# Patient Record
Sex: Female | Born: 1965 | Race: White | Hispanic: No | Marital: Married | Smoking: Current every day smoker
Health system: Southern US, Community
[De-identification: ages and names within clinical notes are randomized; demographics above are authoritative.]

## PROBLEM LIST (undated history)

## (undated) DIAGNOSIS — K219 Gastro-esophageal reflux disease without esophagitis: Secondary | ICD-10-CM

## (undated) DIAGNOSIS — K2270 Barrett's esophagus without dysplasia: Secondary | ICD-10-CM

## (undated) DIAGNOSIS — R112 Nausea with vomiting, unspecified: Secondary | ICD-10-CM

## (undated) DIAGNOSIS — D131 Benign neoplasm of stomach: Secondary | ICD-10-CM

## (undated) DIAGNOSIS — Z9889 Other specified postprocedural states: Secondary | ICD-10-CM

## (undated) DIAGNOSIS — K5900 Constipation, unspecified: Secondary | ICD-10-CM

## (undated) HISTORY — PX: KNEE ARTHROSCOPY W/ MENISCECTOMY: SHX1879

## (undated) HISTORY — DX: Gastro-esophageal reflux disease without esophagitis: K21.9

## (undated) HISTORY — DX: Constipation, unspecified: K59.00

## (undated) HISTORY — DX: Barrett's esophagus without dysplasia: K22.70

## (undated) HISTORY — PX: UPPER GASTROINTESTINAL ENDOSCOPY: SHX188

## (undated) HISTORY — DX: Benign neoplasm of stomach: D13.1

---

## 1999-05-10 ENCOUNTER — Other Ambulatory Visit: Admission: RE | Admit: 1999-05-10 | Discharge: 1999-05-10 | Payer: Self-pay | Admitting: Obstetrics and Gynecology

## 1999-05-22 ENCOUNTER — Inpatient Hospital Stay (HOSPITAL_COMMUNITY): Admission: AD | Admit: 1999-05-22 | Discharge: 1999-05-22 | Payer: Self-pay | Admitting: Obstetrics and Gynecology

## 2001-04-07 ENCOUNTER — Other Ambulatory Visit: Admission: RE | Admit: 2001-04-07 | Discharge: 2001-04-07 | Payer: Self-pay | Admitting: Dermatology

## 2001-12-10 HISTORY — PX: FOOT SURGERY: SHX648

## 2002-07-24 ENCOUNTER — Ambulatory Visit (HOSPITAL_COMMUNITY): Admission: RE | Admit: 2002-07-24 | Discharge: 2002-07-24 | Payer: Self-pay | Admitting: Family Medicine

## 2002-07-24 ENCOUNTER — Encounter: Payer: Self-pay | Admitting: Family Medicine

## 2002-08-11 ENCOUNTER — Encounter: Payer: Self-pay | Admitting: Family Medicine

## 2002-08-11 ENCOUNTER — Ambulatory Visit (HOSPITAL_COMMUNITY): Admission: RE | Admit: 2002-08-11 | Discharge: 2002-08-11 | Payer: Self-pay | Admitting: Family Medicine

## 2003-12-09 ENCOUNTER — Ambulatory Visit (HOSPITAL_COMMUNITY): Admission: RE | Admit: 2003-12-09 | Discharge: 2003-12-09 | Payer: Self-pay | Admitting: Family Medicine

## 2007-04-09 ENCOUNTER — Ambulatory Visit (HOSPITAL_COMMUNITY): Admission: RE | Admit: 2007-04-09 | Discharge: 2007-04-09 | Payer: Self-pay | Admitting: Family Medicine

## 2007-07-21 ENCOUNTER — Ambulatory Visit (HOSPITAL_COMMUNITY): Admission: RE | Admit: 2007-07-21 | Discharge: 2007-07-21 | Payer: Self-pay | Admitting: Obstetrics and Gynecology

## 2007-09-03 ENCOUNTER — Ambulatory Visit (HOSPITAL_COMMUNITY): Admission: RE | Admit: 2007-09-03 | Discharge: 2007-09-03 | Payer: Self-pay | Admitting: Obstetrics & Gynecology

## 2007-09-03 ENCOUNTER — Encounter: Payer: Self-pay | Admitting: Obstetrics & Gynecology

## 2007-12-10 ENCOUNTER — Ambulatory Visit (HOSPITAL_COMMUNITY): Admission: RE | Admit: 2007-12-10 | Discharge: 2007-12-10 | Payer: Self-pay | Admitting: Obstetrics & Gynecology

## 2008-03-10 ENCOUNTER — Ambulatory Visit: Payer: Self-pay | Admitting: Gastroenterology

## 2008-09-09 ENCOUNTER — Other Ambulatory Visit: Admission: RE | Admit: 2008-09-09 | Discharge: 2008-09-09 | Payer: Self-pay | Admitting: Obstetrics and Gynecology

## 2008-09-20 ENCOUNTER — Ambulatory Visit (HOSPITAL_COMMUNITY): Admission: RE | Admit: 2008-09-20 | Discharge: 2008-09-20 | Payer: Self-pay | Admitting: Obstetrics & Gynecology

## 2009-01-21 ENCOUNTER — Encounter: Payer: Self-pay | Admitting: Orthopedic Surgery

## 2009-01-21 ENCOUNTER — Ambulatory Visit (HOSPITAL_COMMUNITY): Admission: RE | Admit: 2009-01-21 | Discharge: 2009-01-21 | Payer: Self-pay | Admitting: Family Medicine

## 2009-02-15 ENCOUNTER — Encounter: Payer: Self-pay | Admitting: Orthopedic Surgery

## 2009-02-16 ENCOUNTER — Ambulatory Visit: Payer: Self-pay | Admitting: Orthopedic Surgery

## 2009-02-16 DIAGNOSIS — M25559 Pain in unspecified hip: Secondary | ICD-10-CM | POA: Insufficient documentation

## 2009-02-17 ENCOUNTER — Encounter: Payer: Self-pay | Admitting: Orthopedic Surgery

## 2009-02-22 ENCOUNTER — Telehealth: Payer: Self-pay | Admitting: Orthopedic Surgery

## 2009-02-23 ENCOUNTER — Ambulatory Visit (HOSPITAL_COMMUNITY): Admission: RE | Admit: 2009-02-23 | Discharge: 2009-02-23 | Payer: Self-pay | Admitting: Orthopedic Surgery

## 2009-03-02 ENCOUNTER — Ambulatory Visit: Payer: Self-pay | Admitting: Orthopedic Surgery

## 2009-03-03 ENCOUNTER — Encounter: Payer: Self-pay | Admitting: Orthopedic Surgery

## 2009-03-04 ENCOUNTER — Encounter: Payer: Self-pay | Admitting: Orthopedic Surgery

## 2009-03-15 ENCOUNTER — Encounter: Payer: Self-pay | Admitting: Orthopedic Surgery

## 2009-05-18 ENCOUNTER — Encounter: Payer: Self-pay | Admitting: Orthopedic Surgery

## 2011-01-11 NOTE — Miscellaneous (Signed)
Summary: notes faxed to [facility],referred  Clinical Lists Changes

## 2011-01-11 NOTE — Assessment & Plan Note (Signed)
Summary: [facility] results from [facility]   History of Present Illness: I saw Carla Wise in the office today for a followup visit.  She is a 45 years old woman with the complaint of:  MRI results of the right hip taken at [facility] 02/23/09.  Vicodin for pain, does not like pills did not take.  IMPRESSION:   1.  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  No acetabular tear or paralabral cyst is identified, and the femoral findings could be incidental. 2.  A smaller cyst in the left femoral neck is probably an incidental synovial herniation pit. 3.  The visualized bony pelvis appears unremarkable. 4.  No pelvic or proximal thigh muscular or tendon abnormality identified.   HISTORY I saw Carla Wise  in the office today for an initial visit.  She is a 45 years old woman with the complaint of:  RIGHT HIP PAIN.  Referral from Dr. Lilyan Punt. for eval and treat.  Xrays at [facility] on 01-21-09. Negative.  Patient states she has pain in her right groin with pain shooting down her inner thigh for 4-5 months. No known injury. She cannot cross her legs. She states that it wakes her up at night. She has pain with external rotation.  Dr. Gerda Diss gave her Etodolac 400 mg 3X a day.  denies fever chills, weight loss, appetite change   Work Status: working   Allergies: No Known Drug Allergies  Physical Exam  Additional Exam:  Constitutional: vital signs see recorded values. General: normal development, nutrition, and grooming. No deformity. Body Habitus is endomorphic CDV: Observation and palpation was normal  Lymph: palpation of the lymph nodes were normal Skin: inspection and palpation of the skin revealed no abnormalities  Neuro: coordination: normal              DTR's normal              Sensation was normal    Psyche: Alert and oriented x 3. Mood was normal.  Affect: normal  MSK: Gait: slightly abnormal favoring the RIGHT lower extremity  The upper extremities were normal on inspection with no contracture, atrophy loss of muscle tone or instability  LEFT lower extremity normal alignment, normal range of motion, normal strength, joints were reduced in proper position  RIGHT lower extremity active flexion was only 45 passive was 80 with pain throughout the arc of motion.  We could not really internally rotate the leg in external rotation causes increased pain, there is no evidence of muscle tone loss or instability    Impression & Recommendations:  Problem # 1:  HIP PAIN (ICD-719.45)  review history for MRI results.  The MRI was done at [facility] and it was reviewed with the report   Assessment:  At this point I think we should get a referral to make sure this is a benign tumor.  This may just be acetabular impingement but I am not sure I would like to get a second opinion.   The following medications were removed from the medication list:    Vicodin 5-500 Mg Tabs (Hydrocodone-acetaminophen) .Marland Kitchen... 1 by mouth q 4 as needed pain  Orders: Est. 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

## 2011-01-11 NOTE — Assessment & Plan Note (Signed)
Summary: R HIP PAIN/XR AT [facility]   History of Present Illness: I saw Carla Wise  in the office today for an initial visit.  She is a 45 years old woman with the complaint of:  RIGHT HIP PAIN.  Referral from Dr. Lilyan Punt. for eval and treat.  Xrays at [facility] on 01-21-09. Negative.  Patient states she has pain in her right groin with pain shooting down her inner thigh for 4-5 months. No known injury. She cannot cross her legs. She states that it wakes her up at night. She has pain with external rotation.  Dr. Gerda Diss gave her Etodolac 400 mg 3X a day.  denies fever chills, weight loss, appetite change   Work Status: working   Press photographer & Management     Alcohol drinks/day: 0     Smoking Status: current     Packs/Day: 0.5     Caffeine use/day: o  Allergies (verified): No Known Drug Allergies  Past Medical History:    na  Past Surgical History:    cervical ablasion  Family History:    FH of Cancer:   Social History:    Patient is married.     shipping and receiving clerk    Alcohol drinks/day:  0    Caffeine use/day:  o    Smoking Status:  current    Packs/Day:  0.5  Review of Systems MS:  Complains of joint pain; denies rheumatoid arthritis, joint swelling, gout, bone cancer, osteoporosis, and .  The review of systems is negative for General, Cardiac , Resp, GI, GU, Neuro, Endo, Psych, Derm, EENT, Immunology, and Lymphatic.  Physical Exam  Additional Exam:  Constitutional: vital signs see recorded values. General: normal development, nutrition, and grooming. No deformity. Body Habitus is endomorphic CDV: Observation and palpation was normal  Lymph: palpation of the lymph nodes were normal Skin: inspection and palpation of the skin revealed no abnormalities  Neuro: coordination: normal              DTR's normal              Sensation was normal  Psyche: Alert and oriented x 3. Mood was normal.  Affect: normal    MSK: Gait: slightly abnormal favoring the RIGHT lower extremity  The upper extremities were normal on inspection with no contracture, atrophy loss of muscle tone or instability  LEFT lower extremity normal alignment, normal range of motion, normal strength, joints were reduced in proper position  RIGHT lower extremity active flexion was only 45 passive was 80 with pain throughout the arc of motion.  We could not really internally rotate the leg in external rotation causes increased pain, there is no evidence of muscle tone loss or instability    Impression & Recommendations:  Problem # 1:  HIP PAIN (ICD-719.45) Assessment New The x-rays were done at [facility]. The report and the films have been reviewed.   incnoclusive Possible FAI vs NOF   recommend: MRI hips  X-rays: inconclusive   Assessment:  This will definitely warrant further workup there is no conclusive diagnosis at this point.  Different diagnoses includes joint effusion, femoral acetabular impingement, bone tumor.  Her updated medication list for this problem includes:    Vicodin 5-500 Mg Tabs (Hydrocodone-acetaminophen) .Marland Kitchen... 1 by mouth q 4 as needed pain  Orders: New Patient Level III ([address])  Medications Added to Medication List This Visit: 1)  Vicodin 5-500 Mg Tabs (Hydrocodone-acetaminophen) .Marland Kitchen.. 1 by mouth q 4  as needed pain Prescriptions: VICODIN 5-500 MG TABS (HYDROCODONE-ACETAMINOPHEN) 1 by mouth q 4 as needed pain  #40 x 1   Entered and Authorized by:   Fuller Canada MD   Signed by:   Fuller Canada MD on 02/16/2009   Method used:   Print then Give to Patient   RxID:   [id number]

## 2011-01-11 NOTE — Letter (Signed)
Summary: Historic Patient File  Historic Patient File   Imported By: Elvera Maria 02/22/2009 10:42:50  _____________________________________________________________________  External Attachment:    Type:   Image     Comment:   history

## 2011-01-11 NOTE — Letter (Signed)
Summary: Office notes from [facility]/Dr. Caswell Corwin  Office notes from [facility]/Dr. Caswell Corwin   Imported By: Jacklynn Ganong 05/19/2009 07:57:43  _____________________________________________________________________  External Attachment:    Type:   Image     Comment:   External Document

## 2011-01-11 NOTE — Progress Notes (Signed)
Summary: MRI appointment.  Phone Note Outgoing Call   Call placed by: Waldon Reining,  February 22, 2009 3:11 PM Call placed to: Patient Action Taken: Phone Call Completed, Appt scheduled Summary of Call: I called to give the patient her MRI appointment at [facility] on 02-23-09 at 5:30 pm. Patient has Renette Butters Rule [facility]), no precert is needed per [place]. Patient will follow up here to go over results.

## 2011-01-11 NOTE — Consult Note (Signed)
Summary: Rpt [facility] Ctr [address],Dr Andrey Campanile  [facility] Ctr [address],Dr Andrey Campanile   Imported By: Cammie Sickle 04/15/2009 09:44:17  _____________________________________________________________________  External Attachment:    Type:   Image     Comment:   External Document

## 2011-01-11 NOTE — Letter (Signed)
Summary: *Orthopedic Consult Note  [facility]  [address]  [place], [place] [address]   Phone: [phone]  Fax: [phone]    Re:    Carla Wise DOB:    [birth year]   Dear: Lorin Picket    Thank you for requesting that we see the above patient for consultation.  A copy of the detailed office note will be sent under separate cover, for your review.  Evaluation today is consistent with:  1)  HIP PAIN (ICD-719.45)   Our recommendation is for: MRI RIGHT hip       Thank you for this opportunity to look after your patient.  Sincerely,   Terrance Mass. MD.

## 2011-01-11 NOTE — Letter (Signed)
Summary: *Referral Letter  [facility]  [address]  [place], [place] [address]   Phone: [phone]  Fax: [phone]    03/03/2009  Thank you in advance for agreeing to see my patient:  Carla Wise [address] [place], [place]  [address]  Phone: [phone]  Reason for Referral: Cyst femoral neck; ? tumor  Procedures Requested:   Current Medical Problems: 1)  HIP PAIN (ICD-719.45) bone tumor          Pertinent Labs: MRI    Thank you again for agreeing to see our patient; please contact us if you have any further questions or need additional information.  Sincerely,  Fuller Canada MD

## 2011-04-24 NOTE — Assessment & Plan Note (Signed)
[name], [name]               CHART#:  [id number]   DATE:  03/10/2008                       DOB:  [birth year]   REFERRING PHYSICIAN:  Patrica Duel, M.D.   REASON FOR CONSULTATION:  Constipation.   HISTORY OF PRESENT ILLNESS:  [name] is a 45 year old-female who  has always had problems moving her bowels.  She goes every three to  four days.  She denies any abdominal pain, bloating or rectal bleeding  associated with the constipation.  Sometimes she strains.  The symptoms  are not any worse than usual.  She did take Amitiza 24 mcg twice a day,  which caused her to have bloating and some gas.  She has also added  fiber cereal, which may be contributing to her bloating and gas.  She  eats Activia twice a day.  Her appetite has been good.  She has not had  any diarrhea.   PAST MEDICAL HISTORY:  Rare headaches (less than one every six months).   PAST SURGICAL HISTORY:  Uterine ablation in September 2008.   ALLERGIES:  No known drug allergies.   MEDICATIONS:  1. Aleve as needed.  2. Amitiza 24 mcg b.i.d.   FAMILY HISTORY:  She has no family history of colon cancer, colon  polyps, breast cancer, uterine cancer or ovarian cancer.   SOCIAL HISTORY:  She is divorced and has one child, age 23.  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  She smokes less than  half pack per day.  She denies any alcohol use.   REVIEW OF SYSTEMS:  She reports that her thyroid was checked in 2008 and  was normal.  Her review of systems is per the HPI.  Otherwise all  systems are negative.   PHYSICAL EXAMINATION:  Weight 102 pounds (down 1 pound since 2000).  Height 5 feet 2 inches.  Temperature 98.2, blood pressure 110/70, pulse  88.  GENERAL: She is in no apparent distress.  Alert and oriented x4.HEENT:  Atraumatic, normocephalic,  Pupils equal and reactive to light.  Mouth  no oral lesions.  Posterior pharynx without erythema or exudate.NECK:  Has full range of motion.  No  lymphadenopathy.LUNGS:  Clear to  auscultation bilaterally.CARDIOVASCULAR:  Regular rhythm.  No  murmur.ABDOMEN:  Bowel sounds are present.  Soft, nontender,  nondistended.  No rebound or guarding.EXTREMITIES:  Have no cyanosis or  edema.  NEURO:  She has no focal or neurologic deficits.   ASSESSMENT:  Carla Wise is a 45 year old female who has had  constipation for many years  Her symptoms are not any different.  She  likely does not consume enough fiber or drink enough water.  Thank you  for allowing me to see Ms. Bullard in consultation.  My recommendations  follow.   RECOMMENDATIONS:  1. She is given her discharge instructions in writing.  For the      management of her constipation she is asked to drink 6 to 8 cups of      water daily.  She should follow a high fiber diet.  She was given a      handout on high fiber diet.  She is asked to find the fiber that      works for her.  She does understand that some fiber may cause  bloating.  2. She is to take Digestive Advantage constipation formulation daily.      Given a sample of 20.  3. She can try Amitiza 8 mcg twice a day, and she is given 8 pills.      She is also given a prescription for the Amitiza at 8 mcg daily.      The Amitiza did allow her to have a bowel movement every day.  She      did not like to take the Senokot, which caused her to have more      growling in her abdomen and so she stopped it.  4. She may follow up with me as needed.  5. Screening colonoscopy at age 53.       Kassie Mends, M.D.  Electronically Signed     SM/MEDQ  D:  03/10/2008  T:  03/10/2008  Job:  [id number]   cc:   Patrica Duel, M.D.

## 2011-04-24 NOTE — Op Note (Signed)
NAMECACHE, DECOURSEY              ACCOUNT NO.:  [id number]   MEDICAL RECORD NO.:  [id number]          PATIENT TYPE:  AMB   LOCATION:  DAY                           FACILITY:  [facility]   PHYSICIAN:  Lazaro Arms, M.D.   DATE OF BIRTH:  1966-01-01   DATE OF PROCEDURE:  09/03/2007  DATE OF DISCHARGE:                               OPERATIVE REPORT   PREOPERATIVE DIAGNOSES:  1. Menometrorrhagia.  2. Dysmenorrhea.   POSTOPERATIVE DIAGNOSES:  1. Menometrorrhagia.  2. Dysmenorrhea.   PROCEDURE:  Hysteroscopy, dilatation and curettage, endometrial  ablation.   SURGEON:  Lazaro Arms, M.D.   ANESTHESIA:  General endotracheal.   FINDINGS:  Patient had a normal endometrial cavity, no polyps, no  fibroids.   DESCRIPTION OF OPERATION:  Patient was taken to the operating room and  placed in a supine position, where she underwent general endotracheal  anesthesia.  Placed in a dorsal supine position, prepped and draped in  the usual sterile fashion.  A Graves speculum was placed.  The cervix  was grasped.  The cervix was dilated serially to allow passage of the  hysteroscopy.  A hysteroscopy was performed and was found to be  completely normal.  No polyps, no fibroids, no distortion of the  endometrium whatsoever.  The ThermaChoice III endometrial ablation  balloon was then used, 13 cc of D5W was required to maintain a pressure  between 192 mmHg throughout the procedure.  It was heated to 88 degrees  Celsius.  Total therapy time was 9 minutes, 14 seconds.  All of the  fluid was returned at the end of the procedure.   The patient tolerated the procedure well.  She experienced minimal blood  loss.  She was taken to the recovery room in good, stable condition.  All counts were correct.      Lazaro Arms, M.D.  Electronically Signed     LHE/MEDQ  D:  09/03/2007  T:  09/03/2007  Job:  [id number]

## 2011-07-19 ENCOUNTER — Ambulatory Visit (INDEPENDENT_AMBULATORY_CARE_PROVIDER_SITE_OTHER): Payer: No Typology Code available for payment source | Admitting: Orthopedic Surgery

## 2011-07-19 ENCOUNTER — Encounter: Payer: Self-pay | Admitting: Orthopedic Surgery

## 2011-07-19 VITALS — Resp 18 | Ht 62.0 in | Wt 101.0 lb

## 2011-07-19 DIAGNOSIS — S83209A Unspecified tear of unspecified meniscus, current injury, unspecified knee, initial encounter: Secondary | ICD-10-CM | POA: Insufficient documentation

## 2011-07-19 DIAGNOSIS — IMO0002 Reserved for concepts with insufficient information to code with codable children: Secondary | ICD-10-CM

## 2011-07-19 NOTE — Progress Notes (Signed)
Chief complaint: pain left knee x 6 months  HPI:(4) sudden onset of medial left knee pain 5-6 mos ago, unrelieved by advil and tylenol. C/O stabbing, 6/10 intermittent, pain and giving out. Its worse with flexing the knee and associated with swelling.  ROS:(2) all negative   PFSH: (1) History reviewed. No pertinent past medical history. Past Surgical History  Procedure Date  . No past surgeries    Physical Exam(12) GENERAL: normal development   CDV: pulses are normal   Skin: normal  Lymph: nodes were not palpable/normal  Psychiatric: awake, alert and oriented  Neuro: normal sensation  MSK no limp Left knee  1 medial joint line tenderness, and small joint effusion. 2 flexion 120 degrees with pain at terminal flexion.  3 strength normal  4 knee is stable  5/6 right knee normal ROM no swelling    Assessment: x-rays: 3 views show mild OA     Plan: MRI/left medial meniscus tear

## 2011-07-19 NOTE — Patient Instructions (Addendum)
MRI knee  Come back in 2 weeks for results

## 2011-07-30 ENCOUNTER — Ambulatory Visit (HOSPITAL_COMMUNITY)
Admission: RE | Admit: 2011-07-30 | Discharge: 2011-07-30 | Disposition: A | Discharge status: Home / Self Care | Payer: No Typology Code available for payment source | Source: Ambulatory Visit | Attending: Orthopedic Surgery | Admitting: Orthopedic Surgery

## 2011-07-30 ENCOUNTER — Telehealth: Payer: Self-pay | Admitting: Radiology

## 2011-07-30 DIAGNOSIS — M23329 Other meniscus derangements, posterior horn of medial meniscus, unspecified knee: Secondary | ICD-10-CM | POA: Insufficient documentation

## 2011-07-30 DIAGNOSIS — M25669 Stiffness of unspecified knee, not elsewhere classified: Secondary | ICD-10-CM | POA: Insufficient documentation

## 2011-07-30 DIAGNOSIS — M25569 Pain in unspecified knee: Secondary | ICD-10-CM | POA: Insufficient documentation

## 2011-07-30 DIAGNOSIS — S83209A Unspecified tear of unspecified meniscus, current injury, unspecified knee, initial encounter: Secondary | ICD-10-CM

## 2011-07-30 NOTE — Telephone Encounter (Signed)
Patient had MRI appointment at [facility] on 07-26-11 at 6:00. Patient has [facility], no precert needed per [employer]. I could not get in touch with the patient so she did not go.

## 2011-08-02 ENCOUNTER — Encounter: Payer: Self-pay | Admitting: Orthopedic Surgery

## 2011-08-02 ENCOUNTER — Ambulatory Visit (INDEPENDENT_AMBULATORY_CARE_PROVIDER_SITE_OTHER): Payer: No Typology Code available for payment source | Admitting: Orthopedic Surgery

## 2011-08-02 DIAGNOSIS — M23329 Other meniscus derangements, posterior horn of medial meniscus, unspecified knee: Secondary | ICD-10-CM

## 2011-08-02 NOTE — Patient Instructions (Signed)
You have been scheduled for surgery.  All surgeries carry some risk.  Remember you always have the option of continued nonsurgical treatment. However in this situation the risks vs. the benefits favor surgery as the best treatment option. The risks of the surgery includes the following but is not limited to bleeding, infection, pulmonary embolus, death from anesthesia, nerve injury vascular injury or need for further surgery, continued pain.  Specific to this procedure the following risks and complications are rare but possible Swelling   You have been scheduled for surgery  Please Go to your preoperative appointment and bring the folder that was given to you today  Please stop all blood thinners ibuprofen Naprosyn aspirin Plavix Coumadin

## 2011-08-02 NOTE — H&P (Signed)
Carla Wise is an 45 y.o. female.   Chief Complaint: LEFT knee pain HPI: 45-year-old female complains of medial joint line pain and mechanical symptoms in the LEFT knee.  She did not respond to nonoperative treatment was sent for MRI and it shows that she does have a medial meniscal tear.  Her symptoms are inhibited her normal activities and she would like to have surgery.    No past medical history on file.  Past Surgical History  Procedure Date  . No past surgeries     Family History  Problem Relation Age of Onset  . Heart disease    . Cancer     Social History:  reports that she has been smoking Cigarettes.  She has been smoking about .5 packs per day. She does not have any smokeless tobacco history on file. Her alcohol and drug histories not on file.  Allergies: No Known Allergies  No current outpatient prescriptions on file as of 08/02/2011.   No current facility-administered medications on file as of 08/02/2011.    No results found for this or any previous visit (from the past 48 hour(s)). @[id number]@  Review of Systems  Constitutional: Negative.   HENT: Negative.   Eyes: Negative.   Respiratory: Negative.   Cardiovascular: Negative.   Gastrointestinal: Negative.   Genitourinary: Negative.   Skin: Negative.   Endo/Heme/Allergies: Negative.   Psychiatric/Behavioral: Negative.     There were no vitals taken for this visit. Physical Exam  Constitutional: She is oriented to person, place, and time. She appears well-developed and well-nourished.  HENT:  Head: Normocephalic and atraumatic.  Neck: Normal range of motion. Neck supple.  Cardiovascular: Normal rate and regular rhythm.   Respiratory: Effort normal.  GI: Soft.  Musculoskeletal:       Right shoulder: Normal.       Left shoulder: Normal.       Right hip: Normal.       Left hip: Normal.       Left knee: She exhibits decreased range of motion, swelling and abnormal meniscus. She exhibits no  deformity, normal alignment, no LCL laxity, normal patellar mobility, no bony tenderness and no MCL laxity. tenderness found. Medial joint line tenderness noted. No lateral joint line, no MCL, no LCL and no patellar tendon tenderness noted.  Neurological: She is alert and oriented to person, place, and time.  Skin: Skin is warm.  Psychiatric: She has a normal mood and affect.     Assessment/Plan MRI shows torn medial meniscus LEFT knee with mild medial compartment chondral thinning  Recommend arthroscopy LEFT knee partial medial meniscectomy  Carla Wise 08/02/2011, 3:54 PM    

## 2011-08-02 NOTE — Progress Notes (Signed)
This is a post imaging followup visit  Diagnosis MEDIAL MEN TEAR  Image MRI LEFT KNEE  Reading POSITIVE FOR mmt, MILD MEDIAL oa   I have viewed the image and the report, I agree with the report   The plan will be SALK PARTIAL MEDIAL MENISECTOMY   PREOP VISIT   SEE H/P

## 2011-08-06 ENCOUNTER — Telehealth: Payer: Self-pay | Admitting: Orthopedic Surgery

## 2011-08-06 NOTE — Telephone Encounter (Addendum)
Called insurer [employer] at [employer] [phone], RE: out-patient surgery scheduled left knee, [facility] . Spoke with Montel Clock A. Verified benefits effective 06/20/11 through 12/21/11, as this is a "short term policy."  States no prior authorization is required. States patient has met $254 of her $1000 deductible.  All claims are subject for review; no guarantee of payment until received and reviewed.

## 2011-08-07 ENCOUNTER — Encounter (HOSPITAL_COMMUNITY)
Admission: RE | Admit: 2011-08-07 | Discharge: 2011-08-07 | Disposition: A | Discharge status: Home / Self Care | Payer: No Typology Code available for payment source | Source: Ambulatory Visit | Attending: Orthopedic Surgery | Admitting: Orthopedic Surgery

## 2011-08-07 ENCOUNTER — Encounter (HOSPITAL_COMMUNITY): Payer: Self-pay

## 2011-08-07 HISTORY — DX: Other specified postprocedural states: Z98.890

## 2011-08-07 HISTORY — DX: Nausea with vomiting, unspecified: R11.2

## 2011-08-07 HISTORY — DX: Nausea with vomiting, unspecified: Z98.890

## 2011-08-07 LAB — CBC
HCT: 39.1 % (ref 36.0–46.0)
Hemoglobin: 13.2 g/dL (ref 12.0–15.0)
MCH: 30 pg (ref 26.0–34.0)
MCHC: 33.8 g/dL (ref 30.0–36.0)
MCV: 88.9 fL (ref 78.0–100.0)
Platelets: 245 10*3/uL (ref 150–400)
RBC: 4.4 MIL/uL (ref 3.87–5.11)
RDW: 14 % (ref 11.5–15.5)
WBC: 6.9 10*3/uL (ref 4.0–10.5)

## 2011-08-07 LAB — BASIC METABOLIC PANEL
BUN: 8 mg/dL (ref 6–23)
CO2: 26 mEq/L (ref 19–32)
Calcium: 9.5 mg/dL (ref 8.4–10.5)
Chloride: 104 mEq/L (ref 96–112)
Creatinine, Ser: 0.61 mg/dL (ref 0.50–1.10)
GFR calc Af Amer: >60 mL/min (ref >60)
GFR calc non Af Amer: >60 mL/min (ref >60)
Glucose, Bld: 84 mg/dL (ref 70–99)
Potassium: 3.8 mEq/L (ref 3.5–5.1)
Sodium: 138 mEq/L (ref 135–145)

## 2011-08-07 LAB — SURGICAL PCR SCREEN
MRSA, PCR: NEGATIVE
Staphylococcus aureus: NEGATIVE

## 2011-08-07 NOTE — Patient Instructions (Addendum)
20 Carla Wise  08/07/2011   Your procedure is scheduled on:  Friday, 08/10/11   Report to [facility] at 06:15 AM.  Call this number if you have problems the morning of surgery: [phone]   Remember:   Do not eat food:After Midnight.  Do not drink clear liquids: After Midnight.  Take these medicines the morning of surgery with A SIP OF WATER: none   Do not wear jewelry, make-up or nail polish.  Do not wear lotions, powders, or perfumes. You may wear deodorant.  Do not shave 48 hours prior to surgery.  Do not bring valuables to the hospital.  Contacts, dentures or bridgework may not be worn into surgery.  Leave suitcase in the car. After surgery it may be brought to your room.  For patients admitted to the hospital, checkout time is 11:00 AM the day of discharge.   Patients discharged the day of surgery will not be allowed to drive home.  Name and phone number of your driver: driver  Special Instructions: Incentive Spirometry - Practice and bring it with you on the day of surgery. and CHG Shower Use Special Wash: 1/2 bottle night before surgery and 1/2 bottle morning of surgery.   Please read over the following fact sheets that you were given: Pain Booklet, Coughing and Deep Breathing, MRSA Information, Surgical Site Infection Prevention, Anesthesia Post-op Instructions and Care and Recovery After Surgery  Arthroscopic Procedure Knee An arthroscopic procedure can find what is wrong with your knee. ABOUT THE PROCEDURE Arthroscopy is a surgical technique that allows your orthopedic surgeon to diagnose and treat your knee injury with accuracy. They will look into your knee through a small instrument. This is almost like a small (pencil sized) telescope. Because arthroscopy affects your knee less than open knee surgery, you can anticipate a more rapid recovery. Taking an active role by following your caregiver's instructions will help with rapid and complete recovery. Use crutches, rest,  elevation, ice, and knee exercises as instructed. The length of recovery depends on various factors including type of injury, age, physical condition, medical conditions, and your rehabilitation. Your knee is the joint between the large bones (femur and tibia) in your leg. Cartilage covers these bone ends which are smooth and slippery and allow your knee to bend and move smoothly. Two menisci, thick, semi-lunar shaped pads of cartilage which form a rim inside the joint, help absorb shock and stabilize your knee. Ligaments bind the bones together and support your knee joint. Muscles move the joint, help support your knee, and take stress off the joint itself. Because of this all programs and physical therapy to rehabilitate an injured or repaired knee require rebuilding and strengthening your muscles. AFTER YOUR ARTHROSCOPY  After the procedure, you will be moved to a recovery area until most of the effects of the medication have worn off. Your caregiver will discuss the test results with you.   Only take over-the-counter or prescription medicines for pain, discomfort, or fever as directed by your caregiver.  SEEK MEDICAL CARE IF:  You have increased bleeding from your wounds.   You see redness, swelling, or have increasing pain in your wounds.   You have pus coming from your wound.   You have an oral temperature above 101.   You notice a bad smell coming from the wound or dressing.   You have severe pain with any motion of your knee.  SEEK IMMEDIATE MEDICAL CARE IF:  You develop a rash.  You have difficulty breathing.   You have any allergic problems.  Document Released: 11/23/2000 Document Re-Released: 02/20/2010 [facility] Patient Information 2011 [place], [place].General Instructions for Surgery These instructions are generic. They cover multiple surgeries and are a general pre-surgical guideline. They do not apply to all procedures. You may have questions. Answers will be provided by  your caregiver. PREPARING FOR SURGERY  Stop smoking at least two weeks prior to surgery. This lowers risk during surgery. Ask your caregiver for help with this if needed. The benefits are well worth it. This is a good time for a healthy lifestyle change.   Your caregiver may advise that you stop taking certain medications that may affect the outcome of the surgery and your ability to heal. For example, you may need to stop taking anti-inflammatories, such as aspirin or ibuprofen because of possible bleeding problems. Other medications may have interactions with anesthesia.   BE SURE TO LET YOUR CAREGIVER KNOW IF YOU HAVE BEEN ON STEROIDS FOR LONG PERIODS OF TIME. THIS IS CRITICAL.   Your caregiver will discuss possible risks and complications with you before surgery. In addition to the usual risks of anesthesia, other common risks and complications include blood loss and replacement (not applicable to minor surgical procedures), temporary increase in pain due to surgery, uncorrected pain or problems the surgery was meant to correct, infection, or new damage.  BEFORE SURGERY Arrive  prior to your procedure or as your caregiver suggests. Check in at the admissions desk to fill out necessary forms if you are not pre-registered. There will be consent forms to sign prior to the procedure. There is a waiting area for your family while you are having your procedure.  LET YOUR CAREGIVERS KNOW ABOUT THE FOLLOWING:  Allergies.  Medications taken including herbs, eye drops, over the counter medications, and creams.   Use of steroids (by mouth or creams).   Previous problems with anesthetics or novocaine.   Possibility of pregnancy, if this applies.  History of blood clots (thrombophlebitis).   History of bleeding or blood problems.   Previous surgery.   Other health problems.   FOLLOWING SURGERY You will be taken to the recovery area where a nurse will watch and check your progress. Once you are  awake, stable, and taking fluids well, barring other problems you will be allowed to go home. Once home, an ice pack wrapped in a light towel applied to your operative site may help with discomfort and keep the swelling down. Follow instructions as suggested by your caregiver. HOME CARE INSTRUCTIONS  Follow your caregiver's instructions as to activities, exercises, physical therapy, or driving a car.   Weight reduction may be helpful depending upon the type of surgery.   Daily exercise is helpful. Maintain strength and range of motion as instructed.   Only take over-the-counter or prescription medicines for pain, discomfort, or fever as directed by your caregiver.  SEEK MEDICAL CARE IF:  You notice increased bleeding (more than a small spot) from the wound.   You soak more than four pads following a uterine procedure unless instructed otherwise.   There is redness, swelling, or increasing pain in the wound.   You notice pus coming from wound.   An unexplained oral temperature over 101 develops, or as your caregiver suggests.   You notice a foul smell coming from the wound or dressing.   You become light headed or if you pass out (faint).  SEEK IMMEDIATE MEDICAL CARE IF:  You develop a rash.  You have difficulty breathing.   You develop any allergic problems.  Document Released: 03/04/2001 Document Re-Released: 02/22/2009 [facility] Patient Information 2011 [place], [place].

## 2011-08-10 ENCOUNTER — Ambulatory Visit (HOSPITAL_COMMUNITY)
Admission: RE | Admit: 2011-08-10 | Discharge: 2011-08-10 | Disposition: A | Discharge status: Home / Self Care | Payer: No Typology Code available for payment source | Source: Ambulatory Visit | Attending: Orthopedic Surgery | Admitting: Orthopedic Surgery

## 2011-08-10 ENCOUNTER — Encounter (HOSPITAL_COMMUNITY): Payer: Self-pay | Admitting: Anesthesiology

## 2011-08-10 ENCOUNTER — Encounter (HOSPITAL_COMMUNITY)
Admission: RE | Disposition: A | Discharge status: Home / Self Care | Payer: Self-pay | Source: Ambulatory Visit | Attending: Orthopedic Surgery

## 2011-08-10 ENCOUNTER — Ambulatory Visit (HOSPITAL_COMMUNITY): Payer: No Typology Code available for payment source | Admitting: Anesthesiology

## 2011-08-10 ENCOUNTER — Encounter (HOSPITAL_COMMUNITY): Payer: Self-pay | Admitting: *Deleted

## 2011-08-10 DIAGNOSIS — IMO0002 Reserved for concepts with insufficient information to code with codable children: Secondary | ICD-10-CM | POA: Insufficient documentation

## 2011-08-10 DIAGNOSIS — M23329 Other meniscus derangements, posterior horn of medial meniscus, unspecified knee: Secondary | ICD-10-CM

## 2011-08-10 DIAGNOSIS — Z01810 Encounter for preprocedural cardiovascular examination: Secondary | ICD-10-CM | POA: Insufficient documentation

## 2011-08-10 DIAGNOSIS — Z01812 Encounter for preprocedural laboratory examination: Secondary | ICD-10-CM | POA: Insufficient documentation

## 2011-08-10 DIAGNOSIS — X58XXXA Exposure to other specified factors, initial encounter: Secondary | ICD-10-CM | POA: Insufficient documentation

## 2011-08-10 SURGERY — ARTHROSCOPY, KNEE, WITH MEDIAL MENISCECTOMY
Anesthesia: General | Laterality: Left | Wound class: Clean

## 2011-08-10 MED ORDER — ACETAMINOPHEN 325 MG PO TABS: 325.0000 mg | ORAL_TABLET | ORAL | Status: DC | PRN

## 2011-08-10 MED ORDER — PROMETHAZINE HCL 12.5 MG PO TABS: ORAL_TABLET | ORAL | Status: DC

## 2011-08-10 MED ORDER — FENTANYL CITRATE 0.05 MG/ML IJ SOLN
INTRAMUSCULAR | Status: AC
  Filled 2011-08-10: qty 2

## 2011-08-10 MED ORDER — GLYCOPYRROLATE 0.2 MG/ML IJ SOLN
0.2000 mg | Freq: Once | INTRAMUSCULAR | Status: AC | PRN
  Administered 2011-08-10: 0.2 mg via INTRAVENOUS

## 2011-08-10 MED ORDER — LIDOCAINE HCL (PF) 1 % IJ SOLN
INTRAMUSCULAR | Status: AC
  Filled 2011-08-10: qty 5

## 2011-08-10 MED ORDER — CELECOXIB 100 MG PO CAPS
ORAL_CAPSULE | ORAL | Status: AC
  Administered 2011-08-10: 400 mg via ORAL
  Filled 2011-08-10: qty 4

## 2011-08-10 MED ORDER — PROPOFOL 10 MG/ML IV EMUL
INTRAVENOUS | Status: AC
  Filled 2011-08-10: qty 20

## 2011-08-10 MED ORDER — CHLORHEXIDINE GLUCONATE 4 % EX LIQD
60.0000 mL | Freq: Once | CUTANEOUS | Status: DC
  Filled 2011-08-10: qty 118

## 2011-08-10 MED ORDER — ONDANSETRON HCL 4 MG/2ML IJ SOLN
4.0000 mg | Freq: Once | INTRAMUSCULAR | Status: AC
  Administered 2011-08-10: 4 mg via INTRAVENOUS

## 2011-08-10 MED ORDER — HYDROCODONE-ACETAMINOPHEN 5-325 MG PO TABS: 1.0000 | ORAL_TABLET | ORAL | Status: AC | PRN

## 2011-08-10 MED ORDER — GLYCOPYRROLATE 0.2 MG/ML IJ SOLN
INTRAMUSCULAR | Status: AC
  Administered 2011-08-10: 0.2 mg via INTRAVENOUS
  Filled 2011-08-10: qty 1

## 2011-08-10 MED ORDER — ONDANSETRON HCL 4 MG/2ML IJ SOLN
INTRAMUSCULAR | Status: AC
  Filled 2011-08-10: qty 2

## 2011-08-10 MED ORDER — BUPIVACAINE-EPINEPHRINE PF 0.5-1:200000 % IJ SOLN
INTRAMUSCULAR | Status: AC
  Filled 2011-08-10: qty 20

## 2011-08-10 MED ORDER — ACETAMINOPHEN 500 MG PO TABS
500.0000 mg | ORAL_TABLET | Freq: Once | ORAL | Status: AC
  Administered 2011-08-10: 500 mg via ORAL

## 2011-08-10 MED ORDER — MIDAZOLAM HCL 2 MG/2ML IJ SOLN
INTRAMUSCULAR | Status: AC
  Administered 2011-08-10: 2 mg via INTRAVENOUS
  Filled 2011-08-10: qty 2

## 2011-08-10 MED ORDER — MIDAZOLAM HCL 2 MG/2ML IJ SOLN
1.0000 mg | INTRAMUSCULAR | Status: DC | PRN
  Administered 2011-08-10: 2 mg via INTRAVENOUS

## 2011-08-10 MED ORDER — BUPIVACAINE-EPINEPHRINE PF 0.5-1:200000 % IJ SOLN
INTRAMUSCULAR | Status: DC | PRN
Start: 2011-08-10 — End: 2011-08-10
  Administered 2011-08-10: 60 mL

## 2011-08-10 MED ORDER — KETOROLAC TROMETHAMINE 30 MG/ML IJ SOLN
INTRAMUSCULAR | Status: AC
  Filled 2011-08-10: qty 1

## 2011-08-10 MED ORDER — ONDANSETRON HCL 4 MG/2ML IJ SOLN
INTRAMUSCULAR | Status: AC
  Administered 2011-08-10: 4 mg via INTRAVENOUS
  Filled 2011-08-10: qty 2

## 2011-08-10 MED ORDER — ACETAMINOPHEN 500 MG PO TABS
ORAL_TABLET | ORAL | Status: AC
  Administered 2011-08-10: 500 mg via ORAL
  Filled 2011-08-10: qty 1

## 2011-08-10 MED ORDER — LIDOCAINE HCL 1 % IJ SOLN
INTRAMUSCULAR | Status: DC | PRN
  Administered 2011-08-10: 10 mg via INTRADERMAL

## 2011-08-10 MED ORDER — KETOROLAC TROMETHAMINE 30 MG/ML IJ SOLN
30.0000 mg | Freq: Once | INTRAMUSCULAR | Status: AC
  Administered 2011-08-10: 30 mg via INTRAVENOUS

## 2011-08-10 MED ORDER — CELECOXIB 100 MG PO CAPS
400.0000 mg | ORAL_CAPSULE | Freq: Once | ORAL | Status: AC
  Administered 2011-08-10: 400 mg via ORAL

## 2011-08-10 MED ORDER — SODIUM CHLORIDE 0.9 % IR SOLN
Status: DC | PRN
  Administered 2011-08-10: 3000 mL
  Administered 2011-08-10: 1000 mL

## 2011-08-10 MED ORDER — CEFAZOLIN SODIUM 1-5 GM-% IV SOLN: 1.0000 g | INTRAVENOUS | Status: DC

## 2011-08-10 MED ORDER — EPINEPHRINE HCL 1 MG/ML IJ SOLN
INTRAMUSCULAR | Status: AC
  Filled 2011-08-10: qty 5

## 2011-08-10 MED ORDER — FENTANYL CITRATE 0.05 MG/ML IJ SOLN
INTRAMUSCULAR | Status: DC | PRN
  Administered 2011-08-10: 50 ug via INTRAVENOUS
  Administered 2011-08-10: 25 ug via INTRAVENOUS
  Administered 2011-08-10: 50 ug via INTRAVENOUS

## 2011-08-10 MED ORDER — ONDANSETRON HCL 4 MG/2ML IJ SOLN: 4.0000 mg | Freq: Once | INTRAMUSCULAR | Status: DC | PRN

## 2011-08-10 MED ORDER — PROPOFOL 10 MG/ML IV EMUL
INTRAVENOUS | Status: DC | PRN
  Administered 2011-08-10: 150 mg via INTRAVENOUS
  Administered 2011-08-10: 30 mg via INTRAVENOUS
  Administered 2011-08-10: 20 mg via INTRAVENOUS

## 2011-08-10 MED ORDER — LACTATED RINGERS IV SOLN
INTRAVENOUS | Status: DC
  Administered 2011-08-10: 1000 mL via INTRAVENOUS

## 2011-08-10 MED ORDER — FENTANYL CITRATE 0.05 MG/ML IJ SOLN: 25.0000 ug | INTRAMUSCULAR | Status: DC | PRN

## 2011-08-10 MED ORDER — CEFAZOLIN SODIUM 1-5 GM-% IV SOLN
INTRAVENOUS | Status: AC
  Administered 2011-08-10: 1 g via INTRAVENOUS
  Filled 2011-08-10: qty 50

## 2011-08-10 SURGICAL SUPPLY — 54 items
ARTHROWAND PARAGON T2 (SURGICAL WAND)
BAG HAMPER (MISCELLANEOUS) ×2 IMPLANT
BANDAGE ELASTIC 6 VELCRO NS (GAUZE/BANDAGES/DRESSINGS) ×2 IMPLANT
BLADE AGGRESSIVE PLUS 4.0 (BLADE) ×2 IMPLANT
BLADE SURG SZ11 CARB STEEL (BLADE) ×2 IMPLANT
CHLORAPREP W/TINT 26ML (MISCELLANEOUS) ×2 IMPLANT
CLOTH BEACON ORANGE TIMEOUT ST (SAFETY) ×2 IMPLANT
COOLER CRYO IC GRAV AND TUBE (ORTHOPEDIC SUPPLIES) ×2 IMPLANT
CUFF CRYO KNEE LG 20X31 COOLER (ORTHOPEDIC SUPPLIES) IMPLANT
CUFF CRYO KNEE18X23 MED (MISCELLANEOUS) ×2 IMPLANT
CUFF TOURNIQUET SINGLE 24IN (TOURNIQUET CUFF) ×2 IMPLANT
CUFF TOURNIQUET SINGLE 34IN LL (TOURNIQUET CUFF) IMPLANT
CUFF TOURNIQUET SINGLE 44IN (TOURNIQUET CUFF) IMPLANT
CUTTER ANGLED DBL BITE 4.5 (BURR) IMPLANT
DECANTER SPIKE VIAL GLASS SM (MISCELLANEOUS) ×4 IMPLANT
FLOOR PAD 36X40 (MISCELLANEOUS) ×2
GAUZE SPONGE 4X4 16PLY XRAY LF (GAUZE/BANDAGES/DRESSINGS) ×2 IMPLANT
GAUZE XEROFORM 5X9 LF (GAUZE/BANDAGES/DRESSINGS) ×2 IMPLANT
GLOVE BIO SURGEON STRL SZ7 (GLOVE) ×2 IMPLANT
GLOVE EXAM NITRILE LRG STRL (GLOVE) ×2 IMPLANT
GLOVE INDICATOR 7.0 STRL GRN (GLOVE) ×2 IMPLANT
GLOVE SKINSENSE NS SZ8.0 LF (GLOVE) ×1
GLOVE SKINSENSE STRL SZ8.0 LF (GLOVE) ×1 IMPLANT
GLOVE SS N UNI LF 8.5 STRL (GLOVE) ×2 IMPLANT
GOWN BRE IMP SLV AUR XL STRL (GOWN DISPOSABLE) ×2 IMPLANT
GOWN STRL REIN XL XLG (GOWN DISPOSABLE) ×2 IMPLANT
HLDR LEG FOAM (MISCELLANEOUS) ×1 IMPLANT
IV NS IRRIG 3000ML ARTHROMATIC (IV SOLUTION) ×8 IMPLANT
KIT BLADEGUARD II DBL (SET/KITS/TRAYS/PACK) ×2 IMPLANT
KIT ROOM TURNOVER AP CYSTO (KITS) ×2 IMPLANT
LEG HOLDER FOAM (MISCELLANEOUS) ×1
MANIFOLD NEPTUNE II (INSTRUMENTS) ×2 IMPLANT
MARKER SKIN DUAL TIP RULER LAB (MISCELLANEOUS) ×2 IMPLANT
NEEDLE HYPO 18GX1.5 BLUNT FILL (NEEDLE) ×2 IMPLANT
NEEDLE HYPO 21X1.5 SAFETY (NEEDLE) ×2 IMPLANT
NEEDLE SPNL 18GX3.5 QUINCKE PK (NEEDLE) ×2 IMPLANT
NS IRRIG 1000ML POUR BTL (IV SOLUTION) ×2 IMPLANT
PACK ARTHRO LIMB DRAPE STRL (MISCELLANEOUS) ×2 IMPLANT
PAD ABD 5X9 TENDERSORB (GAUZE/BANDAGES/DRESSINGS) ×2 IMPLANT
PAD ARMBOARD 7.5X6 YLW CONV (MISCELLANEOUS) ×2 IMPLANT
PAD FLOOR 36X40 (MISCELLANEOUS) ×1 IMPLANT
PADDING CAST COTTON 6X4 STRL (CAST SUPPLIES) ×2 IMPLANT
SET ARTHROSCOPY INST (INSTRUMENTS) ×2 IMPLANT
SET ARTHROSCOPY PUMP TUBE (IRRIGATION / IRRIGATOR) ×2 IMPLANT
SET BASIN LINEN APH (SET/KITS/TRAYS/PACK) ×2 IMPLANT
SPONGE GAUZE 4X4 12PLY (GAUZE/BANDAGES/DRESSINGS) ×2 IMPLANT
STRIP CLOSURE SKIN 1/2X4 (GAUZE/BANDAGES/DRESSINGS) ×2 IMPLANT
SUT ETHILON 3 0 FSL (SUTURE) IMPLANT
SYR 30ML LL (SYRINGE) ×2 IMPLANT
SYRINGE 10CC LL (SYRINGE) ×2 IMPLANT
WAND 50 DEG COVAC W/CORD (SURGICAL WAND) ×2 IMPLANT
WAND 90 DEG TURBOVAC W/CORD (SURGICAL WAND) IMPLANT
WAND ARTHRO PARAGON T2 (SURGICAL WAND) IMPLANT
YANKAUER SUCT BULB TIP 10FT TU (MISCELLANEOUS) ×6 IMPLANT

## 2011-08-10 NOTE — Brief Op Note (Signed)
08/10/2011  8:30 AM  PATIENT:  Carla Wise  45 y.o. female  PRE-OPERATIVE DIAGNOSIS:  torn medial meniscus left knee  POST-OPERATIVE DIAGNOSIS:  torn medial meniscus left knee  PROCEDURE:  Procedure(s):LEFT  KNEE ARTHROSCOPY WITH MEDIAL MENISECTOMY  FINDINGS: TORN MEDIAL MENISCUS/POSTERIOR, UNDERSURFACE   SURGEON:  Surgeon(s): Fuller Canada, MD  PHYSICIAN ASSISTANT:   ASSISTANTS: none   ANESTHESIA:   general  ESTIMATED BLOOD LOSS: * No blood loss amount entered *   BLOOD ADMINISTERED:none  DRAINS: none   LOCAL MEDICATIONS USED:  MARCAINE 60CC  SPECIMEN:  No Specimen  DISPOSITION OF SPECIMEN:  N/A  COUNTS:  YES  TOURNIQUET:  * Missing tourniquet times found for documented tourniquets in log:  2707 *  DICTATION #:   PLAN OF CARE: PACU  PATIENT DISPOSITION:  PACU - hemodynamically stable.   Delay start of Pharmacological VTE agent (>24hrs) due to surgical blood loss or risk of bleeding:  not applicable

## 2011-08-10 NOTE — Anesthesia Procedure Notes (Signed)
Procedure Name: LMA Insertion Date/Time: 08/10/2011 7:37 AM Performed by: Minerva Areola Pre-anesthesia Checklist: Patient identified, Patient being monitored, Emergency Drugs available, Timeout performed and Suction available Patient Re-evaluated:Patient Re-evaluated prior to inductionOxygen Delivery Method: Circle System Utilized Preoxygenation: Pre-oxygenation with 100% oxygen Intubation Type: IV induction Ventilation: Mask ventilation without difficulty LMA: LMA inserted LMA Size: 4.0 Number of attempts: 1 Placement Confirmation: positive ETCO2 and breath sounds checked- equal and bilateral

## 2011-08-10 NOTE — Interval H&P Note (Signed)
History and Physical Interval Note:   08/10/2011   7:15 AM   Carla Wise  has presented today for surgery, with the diagnosis of torn medial meniscus left knee  The various methods of treatment have been discussed with the patient and family. After consideration of risks, benefits and other options for treatment, the patient has consented to  Procedure(s):LEFT KNEE ARTHROSCOPY WITH MEDIAL MENISECTOMY as a surgical intervention .  I have reviewed the patients' chart and labs.  Questions were answered to the patient's satisfaction.     Fuller Canada  MD

## 2011-08-10 NOTE — Anesthesia Postprocedure Evaluation (Signed)
Anesthesia Post Note  Patient: Carla Wise  Procedure(s) Performed:  KNEE ARTHROSCOPY WITH MEDIAL MENISECTOMY - With Partial Medial Menisectomy  Anesthesia type: General  Patient location: PACU  Post pain: Pain level controlled  Post assessment: Post-op Vital signs reviewed, Patient's Cardiovascular Status Stable, Respiratory Function Stable, Patent Airway, No signs of Nausea or vomiting and Pain level controlled  Last Vitals: 96/69 HR 84 R 24 O2 SAT 100   Post vital signs: Reviewed and stable  Level of consciousness: awake and alert   Complications: No apparent anesthesia complications

## 2011-08-10 NOTE — Op Note (Addendum)
Operative report  Date of surgery 08/10/2011  Indications for procedure medial knee pain  Preop imaging torn medial meniscus on MRI  Preop diagnosis torn medial meniscus left knee Postop diagnosis same Procedure arthroscopy left knee partial medial meniscectomy Surgeon Romeo Apple No assistants Anesthesia Gen. Operative findings posterior horn medial meniscal tear undersurface at the root and under the posterior horn. Minimal chondromalacia type changes of the patella  The patient was identified in the preop area both she and I marked the surgical site is a left knee. She was taken to the operating room and given general anesthesia. 1 g of Ancef was given IV. After successful general anesthesia the left leg was prepped and draped with sterile technique.  The timeout was completed. A lateral portal was established. The scope was introduced into the joint into the medial compartment. A tour of the knee visualizing all compartments was performed. A spinal needle was placed in the medial compartment an incision was made to establish a medial portal. Probe was placed into the knee joint and the meniscal tear was palpated and defined. A duckbill forceps was used to resect the meniscal tear. Meniscal fragments were removed with a motorized shaver and the meniscus was contoured with the same. A 50 ArthroCare wand was used to complete the contouring of the knee and balancing of the meniscus. The probe was placed back into the knee to confirm a stable rim.  The knee was washed and cleared of any debris with irrigation and suction.  60 cc of Marcaine was injected into the knee joint through the scope. 3-0 nylon sutures were used to close the portal sites.  The knee was dressed sterilely, and Ace wrap was applied. A Cryo/Cuff was placed and activated.  The patient was extubated and taken to recovery room in stable condition.  Postoperative plan full weightbearing as tolerated followup on  Tuesday.  Medications Phenergan 25 mg every 4 when necessary for nausea Medications Norco 5 mg every 4 hours when necessary for pain

## 2011-08-10 NOTE — Transfer of Care (Signed)
Immediate Anesthesia Transfer of Care Note  Patient: Carla Wise  Procedure(s) Performed:  KNEE ARTHROSCOPY WITH MEDIAL MENISECTOMY - With Partial Medial Menisectomy  Patient Location: PACU  Anesthesia Type: General  Level of Consciousness: awake  Airway & Oxygen Therapy: Patient Spontanous Breathing and non-rebreather face mask  Post-op Assessment: Report given to PACU RN, Post -op Vital signs reviewed and stable and Patient moving all extremities  Post vital signs: Reviewed and stable  Complications: No apparent anesthesia complications

## 2011-08-10 NOTE — H&P (View-Only) (Signed)
Carla Wise is an 45 y.o. female.   Chief Complaint: LEFT knee pain HPI: 45 year old female complains of medial joint line pain and mechanical symptoms in the LEFT knee.  She did not respond to nonoperative treatment was sent for MRI and it shows that she does have a medial meniscal tear.  Her symptoms are inhibited her normal activities and she would like to have surgery.    No past medical history on file.  Past Surgical History  Procedure Date  . No past surgeries     Family History  Problem Relation Age of Onset  . Heart disease    . Cancer     Social History:  reports that she has been smoking Cigarettes.  She has been smoking about .5 packs per day. She does not have any smokeless tobacco history on file. Her alcohol and drug histories not on file.  Allergies: No Known Allergies  No current outpatient prescriptions on file as of 08/02/2011.   No current facility-administered medications on file as of 08/02/2011.    No results found for this or any previous visit (from the past 48 hour(s)). @[id number] @  Review of Systems  Constitutional: Negative.   HENT: Negative.   Eyes: Negative.   Respiratory: Negative.   Cardiovascular: Negative.   Gastrointestinal: Negative.   Genitourinary: Negative.   Skin: Negative.   Endo/Heme/Allergies: Negative.   Psychiatric/Behavioral: Negative.     There were no vitals taken for this visit. Physical Exam  Constitutional: She is oriented to person, place, and time. She appears well-developed and well-nourished.  HENT:  Head: Normocephalic and atraumatic.  Neck: Normal range of motion. Neck supple.  Cardiovascular: Normal rate and regular rhythm.   Respiratory: Effort normal.  GI: Soft.  Musculoskeletal:       Right shoulder: Normal.       Left shoulder: Normal.       Right hip: Normal.       Left hip: Normal.       Left knee: She exhibits decreased range of motion, swelling and abnormal meniscus. She exhibits no  deformity, normal alignment, no LCL laxity, normal patellar mobility, no bony tenderness and no MCL laxity. tenderness found. Medial joint line tenderness noted. No lateral joint line, no MCL, no LCL and no patellar tendon tenderness noted.  Neurological: She is alert and oriented to person, place, and time.  Skin: Skin is warm.  Psychiatric: She has a normal mood and affect.     Assessment/Plan MRI shows torn medial meniscus LEFT knee with mild medial compartment chondral thinning  Recommend arthroscopy LEFT knee partial medial meniscectomy  Fuller Canada 08/02/2011, 3:54 PM

## 2011-08-10 NOTE — Anesthesia Preprocedure Evaluation (Addendum)
Anesthesia Evaluation  Name, MR# and DOB Patient awake  General Assessment Comment  Reviewed: Allergy & Precautions, H&P , NPO status , Patient's Chart, lab work & pertinent test results  History of Anesthesia Complications (+) PONV  Airway Mallampati: I TM Distance: >3 FB Neck ROM: Full    Dental No notable dental hx.    Pulmonary    pulmonary exam normalPulmonary Exam Normal     Cardiovascular Regular Normal    Neuro/Psych Negative Neurological ROS  Negative Psych ROS  GI/Hepatic/Renal negative GI ROS  negative Liver ROS  negative Renal ROS        Endo/Other  Negative Endocrine ROS (+)      Abdominal Normal abdominal exam  (+)   Musculoskeletal negative musculoskeletal ROS (+)   Hematology negative hematology ROS (+)   Peds  Reproductive/Obstetrics negative OB ROS    Anesthesia Other Findings            Anesthesia Physical Anesthesia Plan  ASA: II  Anesthesia Plan: General   Post-op Pain Management:    Induction: Intravenous  Airway Management Planned: LMA  Additional Equipment:   Intra-op Plan:   Post-operative Plan: Extubation in OR  Informed Consent:   Plan Discussed with: CRNA  Anesthesia Plan Comments:         Anesthesia Quick Evaluation

## 2011-08-14 ENCOUNTER — Ambulatory Visit (INDEPENDENT_AMBULATORY_CARE_PROVIDER_SITE_OTHER): Payer: No Typology Code available for payment source | Admitting: Orthopedic Surgery

## 2011-08-14 DIAGNOSIS — Z9889 Other specified postprocedural states: Secondary | ICD-10-CM

## 2011-08-14 DIAGNOSIS — M23329 Other meniscus derangements, posterior horn of medial meniscus, unspecified knee: Secondary | ICD-10-CM

## 2011-08-14 NOTE — Patient Instructions (Signed)
Come back in 3 weeks post op 2 left knee

## 2011-08-14 NOTE — Progress Notes (Signed)
Postop visit #1 status post arthroscopy LEFT knee partial medial meniscectomy for medial meniscal tear  The knee looks good minimal swelling 95 of knee flexion full extension  She is nauseous after taking her sutures out  Currently taking no medication  Start physical therapy come back in 3 weeks.

## 2011-08-21 ENCOUNTER — Ambulatory Visit (HOSPITAL_COMMUNITY)
Admission: RE | Admit: 2011-08-21 | Discharge: 2011-08-21 | Disposition: A | Discharge status: Home / Self Care | Payer: No Typology Code available for payment source | Source: Ambulatory Visit | Attending: Orthopedic Surgery | Admitting: Orthopedic Surgery

## 2011-08-21 DIAGNOSIS — M25569 Pain in unspecified knee: Secondary | ICD-10-CM | POA: Insufficient documentation

## 2011-08-21 DIAGNOSIS — M25669 Stiffness of unspecified knee, not elsewhere classified: Secondary | ICD-10-CM | POA: Insufficient documentation

## 2011-08-21 DIAGNOSIS — M6281 Muscle weakness (generalized): Secondary | ICD-10-CM | POA: Insufficient documentation

## 2011-08-21 DIAGNOSIS — IMO0001 Reserved for inherently not codable concepts without codable children: Secondary | ICD-10-CM | POA: Insufficient documentation

## 2011-08-21 DIAGNOSIS — R262 Difficulty in walking, not elsewhere classified: Secondary | ICD-10-CM | POA: Insufficient documentation

## 2011-08-21 NOTE — Progress Notes (Signed)
Physical Therapy Treatment Patient Details  Name: PIETRINA JAGODZINSKI MRN: [id number] Date of Birth: 1966/10/13  Today's Date: 08/21/2011 Time: 2130-8657 Time Calculation (min): 40 min Charges: 1 eval Visit#: 1 of 8 Re-eval: 09/20/11  Subjective: Symptoms/Limitations Symptoms: L knee SALK after L knee meniscus tear How long can you sit comfortably?: 30 How long can you stand comfortably?: 60 Special Tests: + 90/90 HS test, + Obers test Pain Assessment Currently in Pain?: Yes Pain Score:   3 Pain Location: Knee Pain Orientation: Left Pain Type: Acute pain  Precautions/Restrictions     Mobility (including Balance)    Static Standing Balance Single Leg Stance - Right Leg: 30  Single Leg Stance - Left Leg: 30  Tandem Stance - Right Leg: 30  Tandem Stance - Left Leg: 30  Rhomberg - Eyes Opened: 30  Rhomberg - Eyes Closed: 30    MMT AROM PROM  Hip Flexion 4/5    Gluteus Medius  4+/5    Gluteus Maximus 4/5    Adductor 4/5    Knee Extension 4/5 0 0  Knee Flexion 4/5 108 120   Exercise/Treatments   Supine  Quad Sets 5x10sec hold Heel Slides AAROM 5x10sec hold SLR x10 Sidelying  Hip ABD x10 Hip ADD x10 Prone   Hip ext x10 Knee flexion x10    Physical Therapy Assessment and Plan PT Assessment and Plan Clinical Impression Statement: Pt is a 45 y.o female referred to PT s/p L SALK.  After examination it was found that the patient has current body structure impairments including increased pain, decreased functional strength, decreased ROM, and impaired perceived functional ability which are limiting her in the ability to participate in household and community related activities.  Pt will benefit from skilled physical therapy service to address the above body structure impairments in order to maximize function in order to improve quality of life. Rehab Potential: Good PT Frequency: Min 2X/week PT Duration: 4 weeks PT Treatment/Interventions: Gait training;Stair  training;Functional mobility training;Therapeutic exercise;Other (comment) (manual and modalities for pain and ROM) PT Plan: Add: Bike, stairs, SAQ, TKE, pt is motivated but is likely to overdo activities, take slow to decrease risk of inflammation.     Goals    Problem List Patient Active Problem List  Diagnoses  . HIP PAIN  . Torn meniscus  . Medial meniscus, posterior horn derangement  . Knee pain  . Stiffness of joint, not elsewhere classified, lower leg    PT - End of Session Activity Tolerance: Patient tolerated treatment well  Lorana Maffeo 08/21/2011, 4:40 PM

## 2011-08-23 ENCOUNTER — Ambulatory Visit (HOSPITAL_COMMUNITY)
Admission: RE | Admit: 2011-08-23 | Discharge: 2011-08-23 | Disposition: A | Discharge status: Home / Self Care | Payer: No Typology Code available for payment source | Source: Ambulatory Visit | Attending: Orthopedic Surgery | Admitting: Orthopedic Surgery

## 2011-08-23 NOTE — Progress Notes (Signed)
Physical Therapy Treatment Patient Details  Name: Carla Wise MRN: [id number] Date of Birth: 1966-05-29  Today's Date: 08/23/2011 Time: 1110-1200 Time Calculation (min): 50 min Visit#: 2 of 8 Re-eval: 09/20/11 Charges:  Therex  35' , ice 12'  Subjective: Symptoms/Limitations Symptoms: Pt. reports it's more sore and painful today than last visit; thinks it's due to the new activities. Pain Assessment Currently in Pain?: Yes Pain Score:   6 Pain Location: Knee Pain Orientation: Left  Exercise/Treatments Warm-up: Recumbent Bike seat 7, 6'@2 .0 Standing: (not added today due to increased pain) Step ups Lateral Step ups Forward TKE with blue band Supine  Quad Sets 10x5sec hold  Heel Slides AAROM 10x5sec hold  SLR x10  SAQ 10X5" Sidelying  Hip ABD x10  Hip ADD x10  Prone  Hip ext x10  Knee flexion x10  Modalities Modalities: Cryotherapy Cryotherapy Number Minutes Cryotherapy: 10 Minutes Cryotherapy Location: Knee Type of Cryotherapy: Ice pack  Physical Therapy Assessment and Plan PT Assessment and Plan Clinical Impression Statement: Standing exercises not added today due to increased pain.  Able to perform all other therex with good body mechanics. PT Treatment/Interventions: Therapeutic exercise (icepack) PT Plan: Add standing Exercises next visit if pain decreased.     Problem List Patient Active Problem List  Diagnoses  . HIP PAIN  . Torn meniscus  . Medial meniscus, posterior horn derangement  . Knee pain  . Stiffness of joint, not elsewhere classified, lower leg    PT - End of Session Activity Tolerance: Patient tolerated treatment well General Behavior During Session: [facility] for tasks performed Cognition: [facility] for tasks performed  Lurena Nida 08/23/2011, 12:32 PM

## 2011-08-23 NOTE — Progress Notes (Signed)
Clinical PT Initial Evaluation Patient: Carla Wise Initial Eval : 08/21/11  Physician: Romeo Apple DOB: 04/18/1966  Age: 45 Diagnosis: SALK  Onset Date: Chronic Dx Code: 719.46, 719.56  Employer: [employer] Date of Surgery: 08/10/11  Occupation: Shipping and receiving  Case Manager: N/A  Next MD visit: 09/05/11 Claim #: N/A   HISTORY: Pt is an 45 y.o female referred to PT s/p SALK.  Pt reports that she is unsure of how she tore her meniscus, but over time it gradually started to cause her problems.  RELEVANT MEDICAL HISTORY: L foot surgery, ablation  PREVIOUS THERAPY: Has not received therapy for this injury.  Cognitive Status: Pt is alert and oriented x4 Social History: Lives with husband and 65 year old son.  Enjoys going to baseball games and enjoys cleaning and cooking. Current Functional Status: Lower Extremity Functional Scale: 38/80 Walk: 30 minutes with some rest breaks, Stand: 60 minutes, Sitting: needs to be propped up.  Sleeping: wakes 3x a night after the ice stops working and pt reports she cannot get comfortable.  Prior Functional Status: Able to complete all work and daily activities without an increase in L knee pain Barriers to Education: None EMPLOYMENT DATA: on leave.  Pt needs to be able to kneel, climb ladders, walk on uneven surfaces, squat and lift 40lbs   SUBJECTIVE:  PATIENT GOAL: "To decrease pain, improve ROM" PAIN RATING: (on a scale from 0-10): current:  3/10  to.  Range: 3-6/10 throughout the day and night Nature: Pain in the morning and night time sharp and shooting to L knee joint.  Aggravating: Increased activity  Alleviating: Propping leg and icing knee.  OBJECTIVE: OBSERVATION: Pt sits comfortable with left leg crossed on EOB.  MMT AROM PROM  Hip Flexion 4/5    Gluteus Medius  4+/5    Gluteus Maximus 4/5    Adductor 4/5    Knee Extension 4/5 0 0  Knee Flexion 4/5 108 120   GAIT: Antalgic gait with decreased knee flexion and toe off.  FUNCTION:  See above.  Balance: WNL  5 Sit to Stand: 8.7 sec. Ascend and descend stairs with antalgic gait with L hip circumduction and decreased knee flexion w/reciprocal pattern and 1 handrail. PALPATION:  Special Test:    CLINICAL ASSESSMENT: Pt is a 45 y.o female referred to PT s/p L SALK.  After examination it was found that the patient has current body structure impairments including increased pain, decreased functional strength, decreased ROM, and impaired perceived functional ability which are limiting her in the ability to participate in household and community related activities.  Pt will benefit from skilled physical therapy service to address the above body structure impairments in order to maximize function in order to improve quality of life.      TREATMENT DIAGNOSIS: Knee Pain: 719.46, Knee stiffness 719.56 Rehab Potential: Good PLAN: 1. Therapeutic exercise to include stretching, strengthening and HEP. 2. Gait training 3. Balance re-education 4. Manual techniques and modalities as needed for pain reduction.  FREQUENCY / DURATION: 2 x/week x 4 weeks. SHORT TERM GOALS: to be met in 2 weeks. Patient will be able to: 1. Pt will be Independent in HEP in order to maximize therapeutic effect. 2. Pt will report pain less than 2/10 during 50%  of her day 3. Pt will improve AROM to 0-115 LONG TERM GOALS: to be met in 4 weeks. Patient will be able to: 1. Pt will improve knee AROM to [facility] in order to ascend and descend  stairs/ladder with appropriate mechanics for safe return to work.  2. Pt will increase LE strength to [facility] in order to ambulate and stand for greater than 2 hours to participate in work activities. 3. Pt will report pain less than or equal to 1/10 for 75% of her day for improved quality of life. 4. Pt will improve her LEFS score by 9 points for improved perceived functional ability.  INITIAL TREATMENT: Initial evaluation, therapeutic exercise and education in HEP. Pt agreeable to  treatment plan.  PRECAUTIONS:N/A CONTRAINDICATIONS: N/A. Thank you for your referral,   _____________________                       __________________________   Annett Fabian, PT Date                         Physician Treatment Plan Checklist Your signature is required to indicate approval of the treatment plan as stated above.  Please make a copy of this report for your files and return this physician signed original in the self-addressed envelope. PLEASE CHECK ONE: _____ 1. APPROVE PLAN _____2. APPROVE PLAN WITH THE FOLLOWING CHANGES: ______ __________________________________________________________________________________ ________________________________   __________________________ PHYSICIAN SIGNATURE       DATE

## 2011-08-27 ENCOUNTER — Ambulatory Visit (HOSPITAL_COMMUNITY)
Admission: RE | Admit: 2011-08-27 | Discharge: 2011-08-27 | Disposition: A | Discharge status: Home / Self Care | Payer: No Typology Code available for payment source | Source: Ambulatory Visit | Attending: Orthopedic Surgery | Admitting: Orthopedic Surgery

## 2011-08-27 NOTE — Progress Notes (Signed)
Physical Therapy Treatment Patient Details  Name: Carla Wise MRN: [id number] Date of Birth: Apr 06, 1966  Today's Date: 08/27/2011 Time: 4098-1191 Time Calculation (min): 53 min Visit#: 3 of 8 Re-eval: 09/20/11 Charges:  Therex 36', ice 10'    Subjective: Symptoms/Limitations Symptoms: Pt reports it feels a little better today, 5/10, L knee Pain Assessment Currently in Pain?: Yes Pain Score:   5 Pain Location: Knee Pain Orientation: Left   Exercise/Treatments Warm-up:  Recumbent Bike seat 9, 6'@2 .5  Standing:  Heelraises 15X Squats 10X Step ups Lateral 10X 4" step Step ups Forward  10X 4" step Supine  Quad Sets 15x5sec hold  Heel Slides AAROM 15x5sec hold  SLR x15 SAQ 15X5"  Sidelying  Hip ABD x15  Hip ADD x15  Prone  Hip ext x15  Knee flexion x15   Modalities Modalities: Cryotherapy Cryotherapy Number Minutes Cryotherapy: 10 Minutes Cryotherapy Location: Knee Type of Cryotherapy: Ice pack  Physical Therapy Assessment and Plan PT Assessment and Plan Clinical Impression Statement: Added standing therex today without difficulty.  Increased reps without difficulty. PT Treatment/Interventions: Therapeutic exercise;Other (comment) (icepack) PT Plan: Add TKE's and forward step downs next visit.     Problem List Patient Active Problem List  Diagnoses  . HIP PAIN  . Torn meniscus  . Medial meniscus, posterior horn derangement  . Knee pain  . Stiffness of joint, not elsewhere classified, lower leg    PT - End of Session Activity Tolerance: Patient tolerated treatment well General Behavior During Session: [facility] for tasks performed Cognition: [facility] for tasks performed  Bascom Levels, Hani Patnode B 08/27/2011, 11:07 AM

## 2011-08-29 ENCOUNTER — Ambulatory Visit (HOSPITAL_COMMUNITY): Payer: No Typology Code available for payment source | Admitting: Physical Therapy

## 2011-08-31 ENCOUNTER — Ambulatory Visit (HOSPITAL_COMMUNITY)
Admission: RE | Admit: 2011-08-31 | Discharge: 2011-08-31 | Disposition: A | Discharge status: Home / Self Care | Payer: No Typology Code available for payment source | Source: Ambulatory Visit

## 2011-08-31 NOTE — Progress Notes (Signed)
Physical Therapy Treatment Patient Details  Name: Carla Wise MRN: [id number] Date of Birth: 05-Oct-1966  Today's Date: 08/31/2011 Time: 1015-1110 Time Calculation (min): 55 min Visit#: 4  of 8   Re-eval: 09/20/11  Charge: therex 39 min Icex  Subjective: Symptoms/Limitations Symptoms: 5/10 L knee intermittent pain; worse at night, swelling continues. Pain Assessment Currently in Pain?: Yes Pain Score:   5 Pain Location: Knee Pain Orientation: Left Pain Type: Acute pain Pain Frequency: Intermittent  Objective: R AROM 0-150   L AROM 0-128 Exercise/Treatments Warm-up:  Recumbent Bike seat 7, 6'@2 .5  Standing:  Heelraises 15X  Squats 10X  Step ups Lateral 10X 4" step  Step ups Forward 10X 4" step  Step down 10x 4"  TKE 10x 5" Seated: Cybex quad 1.5 PL 10x Cybex h/s 3 PL 15x Supine  Quad Sets 15x5sec hold  Heel Slides AAROM 15x5sec hold  SLR x15 with 3# SAQ 15X5"  Sidelying  Hip ABD x15 with 3# Hip ADD x15 with 3# Prone  Hip ext x15  Knee flexion x15 with 3# Ice 10 min Physical Therapy Assessment and Plan PT Assessment and Plan Clinical Impression Statement: Added therex per PT POC plan, able to also add cybex quad and h/s.  Step down presentes with weak eccentric quad control.  All therex performed correctly without difficulty.   PT Plan: Complete current status update next session prior MD appt, not time for re-eval yet.  Continue progressing strength and stability.    Goals    Problem List Patient Active Problem List  Diagnoses  . HIP PAIN  . Torn meniscus  . Medial meniscus, posterior horn derangement  . Knee pain  . Stiffness of joint, not elsewhere classified, lower leg    PT - End of Session Activity Tolerance: Patient tolerated treatment well General Behavior During Session: [facility] for tasks performed Cognition: [facility] for tasks performed  Juel Burrow 08/31/2011, 11:18 AM

## 2011-09-03 ENCOUNTER — Ambulatory Visit (HOSPITAL_COMMUNITY)
Admission: RE | Admit: 2011-09-03 | Discharge: 2011-09-03 | Disposition: A | Discharge status: Home / Self Care | Payer: No Typology Code available for payment source | Source: Ambulatory Visit | Attending: Orthopedic Surgery | Admitting: Orthopedic Surgery

## 2011-09-03 NOTE — Progress Notes (Addendum)
Physical Therapy Progress Note   Patient Details  Name: Carla Wise MRN: [id number] Date of Birth: Jun 20, 1966  Today's Date: 09/03/2011 Time: 2725-3664 Time Calculation (min): 26 min Charges: 1 ROM, 1 MMT, 15'TE, 1 e-stim Visit#: 5  of 13   Re-eval: 10/03/11    Past Medical History:  Past Medical History  Diagnosis Date  . PONV (postoperative nausea and vomiting)    Past Surgical History:  Past Surgical History  Procedure Date  . No past surgeries   . Foot surgery 2003    Subjective Symptoms/Limitations How long can you sit comfortably?: 2 hours How long can you stand comfortably?: 2 hours How long can you walk comfortably?: 45-60 minutes  Assessment LLE PROM (degrees) Left Knee Extension 0-130: 0 Left Knee Flexion 0-140: 120 LLE Strength Left Hip Flexion: 5/5 Left Hip Extension: 4/5 Left Hip ABduction: 5/5 Left Hip ADduction: 5/5 Left Knee Flexion: 5/5 Left Knee Extension: 5/5  Exercise/Treatments  09/03/11 0700  Knee Exercises: Aerobic  Elliptical 5' 3.0  Knee Exercises: Standing  Forward Lunges 5 reps  Forward Step Up Left;1 set;10 reps;Limitations  Forward Step Up Limitations step onto chair  Stairs 3 RT w/o handrail   Knee Exercises: Seated  Other Seated Knee Exercises Tall Kneeling on foam pad 2x10, Forward lunges with L and R knee on foam x10 each  Knee Exercises: Prone  Other Prone Exercises heel squeezes 10x10sec      Modalities Modalities: Electrical Stimulation Cryotherapy Number Minutes Cryotherapy: 10 Minutes Electrical Stimulation Electrical Stimulation Location: L knee  Electrical Stimulation Action: IFES x10 minutes w/ice Electrical Stimulation Goals: Pain  Physical Therapy Assessment and Plan PT Assessment and Plan Clinical Impression Statement: Pt tolerated all new higher level ther-ex well.  She continues to have the greatest impairments with functional gluteal and hip strength as well as increased knee pain, especially with  direct contact which is required for work.  She will continue to benefit from skilled PT services in order to address higher level functional activities and above impairments for safe return to work.  Rehab Potential: Good PT Frequency: Min 2X/week PT Duration: 4 weeks PT Treatment/Interventions: Gait training;Stair training;Functional mobility training;Therapeutic exercise (Modalities and manual pain control. ) PT Plan: Assess pain next visit after her e-stim.  Address MD apt.  Cont to progress higher level activities slowly to decrease chance of swelling and pain.  D/C bike, add elliptical    Goals PT Short Term Goals Time to Complete Short Term Goals: 4 weeks PT Short Term Goal 1: 1.Pt will be Independent in HEP in order to maximize therapeutic effect. PT Short Term Goal 1 - Progress: Met PT Short Term Goal 2: 2.Pt will report pain less than 2/10 during 50%  of her day PT Short Term Goal 2 - Progress: Not met PT Short Term Goal 3: 3.Pt will improve AROM to 0-115 PT Short Term Goal 3 - Progress: Met PT Long Term Goals PT Long Term Goal 1: 1.Pt will improve knee AROM to [facility] in order to ascend and descend stairs/ladder with appropriate mechanics for safe return to work.  PT Long Term Goal 1 - Progress: Partly met PT Long Term Goal 2: 2.Pt will increase LE strength to [facility] in order to ambulate and stand for greater than 2 hours to participate in work activities. PT Long Term Goal 2 - Progress: Partly met Long Term Goal 3: 3.Pt will report pain less than or equal to 1/10 for 75% of her day for improved quality of  life. Long Term Goal 3 Progress: Not met Long Term Goal 4: 4.Pt will improve her LEFS score by 9 points for improved perceived functional ability.   Problem List Patient Active Problem List  Diagnoses  . HIP PAIN  . Torn meniscus  . Medial meniscus, posterior horn derangement  . Knee pain  . Stiffness of joint, not elsewhere classified, lower leg    PT - End of  Session Activity Tolerance: Patient tolerated treatment well   Azekiel Cremer 09/03/2011, 11:31 AM  Physician Documentation Your signature is required to indicate approval of the treatment plan as stated above.  Please sign and either send electronically or make a copy of this report for your files and return this physician signed original.   Please mark one 1.__approve of plan  2. ___approve of plan with the following conditions.   ______________________________                                                          _____________________ Physician Signature                                                                                                             Date

## 2011-09-05 ENCOUNTER — Encounter: Payer: Self-pay | Admitting: Orthopedic Surgery

## 2011-09-05 ENCOUNTER — Ambulatory Visit (INDEPENDENT_AMBULATORY_CARE_PROVIDER_SITE_OTHER): Payer: No Typology Code available for payment source | Admitting: Orthopedic Surgery

## 2011-09-05 ENCOUNTER — Ambulatory Visit (HOSPITAL_COMMUNITY)
Admission: RE | Admit: 2011-09-05 | Discharge: 2011-09-05 | Disposition: A | Discharge status: Home / Self Care | Payer: No Typology Code available for payment source | Source: Ambulatory Visit | Attending: Orthopedic Surgery | Admitting: Orthopedic Surgery

## 2011-09-05 DIAGNOSIS — Z9889 Other specified postprocedural states: Secondary | ICD-10-CM

## 2011-09-05 DIAGNOSIS — IMO0002 Reserved for concepts with insufficient information to code with codable children: Secondary | ICD-10-CM

## 2011-09-05 DIAGNOSIS — S83209A Unspecified tear of unspecified meniscus, current injury, unspecified knee, initial encounter: Secondary | ICD-10-CM

## 2011-09-05 NOTE — Progress Notes (Signed)
Physical Therapy Treatment Patient Details  Name: Carla Wise MRN: [id number] Date of Birth: [birth year]  Today's Date: 09/05/2011 Time: 1110-1200 Time Calculation (min): 50 min Visit#: 6  of 13   Re-eval: 10/03/11  Charge: therex 35 min Ice 10 min  Subjective: Symptoms/Limitations Symptoms: 3/10 L knee intermittent pain, swelling continues.  Pt stated the estim relieved pain for ~1 hr, no noticable reduction in swelling. Pain Assessment Pain Score:   3 Pain Location: Knee Pain Orientation: Left Pain Type: Acute pain Pain Frequency: Intermittent  Objective:  Exercise/Treatments Warm up: Elliptical 5' @ L3  Standing: Stairwell 3RT without HR Lateral step up 4" 2x 10 Lunge walk 1 RT Functional squats 10x Wall slides 10x 10"  Prone: Hip extension 2x 10 reps 4#  Sitting: Cybex knee ext 2PL 15 reps Cybex knee flex 3.5 PL 2x 10reps  Half kneeling: Half kneeling on foam forward lunge 2x 10  Physical Therapy Assessment and Plan PT Assessment and Plan Clinical Impression Statement: Advanced therex per weakness found in last session.  Pt continues to have weakness gluteal and hip extension strength.  No increased pain with therex, swelling still an issue.   PT Plan: Continue progressing higher level therex activities.    Goals    Problem List Patient Active Problem List  Diagnoses  . HIP PAIN  . Torn meniscus  . Medial meniscus, posterior horn derangement  . Knee pain  . Stiffness of joint, not elsewhere classified, lower leg    PT - End of Session Activity Tolerance: Patient tolerated treatment well General Behavior During Session: [facility] for tasks performed Cognition: [facility] for tasks performed  Juel Burrow 09/05/2011, 11:58 AM

## 2011-09-05 NOTE — Patient Instructions (Signed)
New work note for 3 weeks  Continue PT  FU 3 weeks

## 2011-09-05 NOTE — Progress Notes (Signed)
Postop visit # 2  status post arthroscopy LEFT knee partial medial meniscectomy for medial meniscal tear  C/O soreness   Knee flexion = 125  Minimal swelling  Ambulation normal without cane or crutch   Progressing well  Ret 3 weeks

## 2011-09-07 ENCOUNTER — Ambulatory Visit (HOSPITAL_COMMUNITY): Payer: No Typology Code available for payment source

## 2011-09-11 ENCOUNTER — Ambulatory Visit (HOSPITAL_COMMUNITY)
Admission: RE | Admit: 2011-09-11 | Discharge: 2011-09-11 | Disposition: A | Discharge status: Home / Self Care | Payer: No Typology Code available for payment source | Source: Ambulatory Visit | Attending: Orthopedic Surgery | Admitting: Orthopedic Surgery

## 2011-09-11 DIAGNOSIS — R262 Difficulty in walking, not elsewhere classified: Secondary | ICD-10-CM | POA: Insufficient documentation

## 2011-09-11 DIAGNOSIS — M6281 Muscle weakness (generalized): Secondary | ICD-10-CM | POA: Insufficient documentation

## 2011-09-11 DIAGNOSIS — M25569 Pain in unspecified knee: Secondary | ICD-10-CM | POA: Insufficient documentation

## 2011-09-11 DIAGNOSIS — IMO0001 Reserved for inherently not codable concepts without codable children: Secondary | ICD-10-CM | POA: Insufficient documentation

## 2011-09-11 DIAGNOSIS — M25669 Stiffness of unspecified knee, not elsewhere classified: Secondary | ICD-10-CM | POA: Insufficient documentation

## 2011-09-11 NOTE — Progress Notes (Signed)
Physical Therapy Treatment Patient Details  Name: Carla Wise MRN: [id number] Date of Birth: Jan 30, 1966  Today's Date: 09/11/2011 Time: 1027-2536 Time Calculation (min): 48 min Visit#: 7  of 13   Re-eval: 10/03/11  Charge: therex 33 min Ice 10 min  Subjective: Symptoms/Limitations Symptoms: L knee feeling good today, no c/o, Pain Assessment Currently in Pain?: No/denies  Objective:   Exercise/Treatments Warm up:  Elliptical 5' @ L3  Standing:  Stairwell 3RT without HR, skip a step ascending Lateral step up 6" 2x 10  Lunge walk 1 RT  Wall slides 10x 10"  Retro gait 1 RT SLS >1', SLS on foam >1' Vector stance 5x 5" Rebounder SLS L on foam with red ball 10reps Prone:  Hip extension 2x 10 reps 4#  Heel squeezes 10x 10" Sitting:  Cybex knee ext 2.5PL 2x 10 reps Cybex knee flex 4.5 PL 2x 10reps  Half kneeling:  Half kneeling on foam B forward lunge 20 reps Modalities Modalities: Cryotherapy Cryotherapy Number Minutes Cryotherapy: 10 Minutes Cryotherapy Location: Knee Type of Cryotherapy: Ice pack  Physical Therapy Assessment and Plan PT Assessment and Plan Clinical Impression Statement: Added SLS, rebounder and vector stance for L LE stability.  Pt with good ankle strategy shown.  Advanced therex without difficulty, pt continues to show weakness in gluteal and hip extension regions..  No increased pain with therex, swelling still an issue.  Pt stated interest in reducing PT to once a week continuing HEP daily. PT Plan: Continue with current PT POC.  Decrease PT sessions to once a week per pt. request, continue HEP daily.  Reeval before MD appt 09/26/2011.    Goals    Problem List Patient Active Problem List  Diagnoses  . HIP PAIN  . Torn meniscus  . Medial meniscus, posterior horn derangement  . Knee pain  . Stiffness of joint, not elsewhere classified, lower leg    PT - End of Session Activity Tolerance: Patient tolerated treatment  well General Behavior During Session: [facility] for tasks performed Cognition: [facility] for tasks performed  Juel Burrow 09/11/2011, 11:40 AM

## 2011-09-12 ENCOUNTER — Telehealth (HOSPITAL_COMMUNITY): Payer: Self-pay

## 2011-09-13 ENCOUNTER — Ambulatory Visit (HOSPITAL_COMMUNITY): Payer: No Typology Code available for payment source

## 2011-09-18 ENCOUNTER — Ambulatory Visit (HOSPITAL_COMMUNITY)
Admission: RE | Admit: 2011-09-18 | Discharge: 2011-09-18 | Disposition: A | Discharge status: Home / Self Care | Payer: No Typology Code available for payment source | Source: Ambulatory Visit | Attending: Family Medicine | Admitting: Family Medicine

## 2011-09-18 NOTE — Progress Notes (Signed)
Physical Therapy Treatment Patient Details  Name: FRANCEEN ERISMAN MRN: [id number] Date of Birth: [birth year]  Today's Date: 09/18/2011 Time: 1010-1045 Time Calculation (min): 35 min Visit#: 8  of 13   Re-eval: 10/03/11  Charge: therex: 27 min  Subjective: Symptoms/Limitations Symptoms: L knee feeling good today, pt requested to leave early today Pain Assessment Currently in Pain?: No/denies  Precautions/Restrictions     Mobility (including Balance)       Exercise/Treatments Aerobic Elliptical: 5' L3 Machines for Strengthening Cybex Knee Extension: 2.5 Pl 3x 10 Cybex Knee Flexion: 4.5 PL 3x 10 Standing Lateral Step Up: 15 reps;Step Height: 6" Forward Step Up: Limitations Forward Step Up Limitations: 20 reps step up onto chair Step Down: 15 reps;Limitations Step Down Limitations: on BOSU Wall Squat: 10 reps;10 seconds Lunge Walking - Round Trips: 1 RT Stairs: 3 RT w/o handrail, skip a step  SLS with Vectors: 5x 15" Rebounder: 15 reps on foam with red ball   Physical Therapy Assessment and Plan PT Assessment and Plan Clinical Impression Statement: Nicholes Rough, PTT began therex session from 10:10-10:18.  Advanced therex for knee stability/strengthening, pt. able to complete with notable weekness shown but no increased pain with any activity. PT Plan: Continue with current POC.  Reeval before MD app on 09/26/2011.      Goals    Problem List Patient Active Problem List  Diagnoses  . HIP PAIN  . Torn meniscus  . Medial meniscus, posterior horn derangement  . Knee pain  . Stiffness of joint, not elsewhere classified, lower leg    PT - End of Session Activity Tolerance: Patient tolerated treatment well General Behavior During Session: [facility] for tasks performed Cognition: [facility] for tasks performed  Juel Burrow 09/18/2011, 10:49 AM

## 2011-09-20 ENCOUNTER — Ambulatory Visit (HOSPITAL_COMMUNITY): Payer: No Typology Code available for payment source

## 2011-09-20 LAB — COMPREHENSIVE METABOLIC PANEL
ALT: 13
AST: 17
Albumin: 4.1
Alkaline Phosphatase: 42
BUN: 13
CO2: 27
Calcium: 8.9
Chloride: 106
Creatinine, Ser: 0.69
GFR calc Af Amer: >60
GFR calc non Af Amer: >60
Glucose, Bld: 93
Potassium: 4
Sodium: 137
Total Bilirubin: 0.7
Total Protein: 6.1

## 2011-09-20 LAB — URINALYSIS, ROUTINE W REFLEX MICROSCOPIC
Bilirubin Urine: NEGATIVE
Glucose, UA: NEGATIVE
Hgb urine dipstick: NEGATIVE
Ketones, ur: NEGATIVE
Nitrite: NEGATIVE
Protein, ur: NEGATIVE
Specific Gravity, Urine: 1.025
Urobilinogen, UA: 0.2
pH: 6

## 2011-09-20 LAB — HCG, QUANTITATIVE, PREGNANCY: hCG, Beta Chain, Quant, S: 3

## 2011-09-20 LAB — CBC
HCT: 41.1
Hemoglobin: 13.9
MCHC: 33.9
MCV: 89.8
Platelets: 230
RBC: 4.58
RDW: 14.3 — ABNORMAL HIGH
WBC: 6.1

## 2011-09-25 ENCOUNTER — Ambulatory Visit: Payer: No Typology Code available for payment source | Admitting: Orthopedic Surgery

## 2011-09-25 ENCOUNTER — Ambulatory Visit (HOSPITAL_COMMUNITY)
Admission: RE | Admit: 2011-09-25 | Discharge: 2011-09-25 | Disposition: A | Discharge status: Home / Self Care | Payer: No Typology Code available for payment source | Source: Ambulatory Visit | Attending: Family Medicine | Admitting: Family Medicine

## 2011-09-25 DIAGNOSIS — M25669 Stiffness of unspecified knee, not elsewhere classified: Secondary | ICD-10-CM | POA: Insufficient documentation

## 2011-09-25 DIAGNOSIS — M25569 Pain in unspecified knee: Secondary | ICD-10-CM | POA: Insufficient documentation

## 2011-09-25 NOTE — Progress Notes (Signed)
Physical Therapy Evaluation  Patient Details  Name: ZORI BENBROOK MRN: [id number] Date of Birth: 1966/01/20  Today's Date: 09/25/2011 Time: 7829-5621 Time Calculation (min): 23 min TE x23 minutes Visit#: 9  of 13   Re-eval: 10/03/11    Past Medical History:  Past Medical History  Diagnosis Date  . PONV (postoperative nausea and vomiting)    Past Surgical History:  Past Surgical History  Procedure Date  . No past surgeries   . Foot surgery 2003    Subjective Symptoms/Limitations Symptoms: Pt reports she is doing very well, but needs to leave at 11:45 secondary to childs appointment.   How long can you sit comfortably?: unlimited How long can you stand comfortably?: 6 hours How long can you walk comfortably?: 6 hours with minimal standing.  Assessment LLE AROM (degrees) Left Knee Extension 0-130: 0  Left Knee Flexion 0-140: 125  LLE Strength Left Hip Extension: 5/5  Functional Scale: LEFS: 77/80  Exercise/Treatments Aerobic Stationary Bike: 6' 3.0 Machines for Strengthening Cybex Knee Extension: 3 PL x15 Cybex Knee Flexion: 5 PL x15 Standing Forward Lunges: 20 reps Forward Lunges Limitations: Half kneeling with knee on foam B forward lunges Stairs: 3 RT w/o handrail, skip a step  Other Standing Knee Exercises: Tall Kneeling x20    Physical Therapy Assessment and Plan PT Assessment and Plan Clinical Impression Statement: Ms. Calles has made significant progress with her AROM, strength and functional activities.  She is able to perform all necessary job requirments including half kneeling, squatting, tall kneeling, climbing up and down a ladder  and reports ability to stand/walk for 6 hours witthout difficulty.  Pt will continue to benefit from an advanced HEP.  PT Plan: D/C w/advanced HEP    Goals PT Short Term Goals Time to Complete Short Term Goals: 4 weeks PT Short Term Goal 1: 1.Pt will be Independent in HEP in order to maximize therapeutic  effect. PT Short Term Goal 1 - Progress: Met PT Short Term Goal 2: 2.Pt will report pain less than 2/10 during 50%  of her day PT Short Term Goal 2 - Progress: Met PT Short Term Goal 3: 3.Pt will improve AROM to 0-115 PT Short Term Goal 3 - Progress: Met PT Long Term Goals PT Long Term Goal 1: 1.Pt will improve knee AROM to [facility] in order to ascend and descend stairs/ladder with appropriate mechanics for safe return to work.  PT Long Term Goal 2: 2.Pt will increase LE strength to [facility] in order to ambulate and stand for greater than 2 hours to participate in work activities. PT Long Term Goal 2 - Progress: Met Long Term Goal 3: 3.Pt will report pain less than or equal to 1/10 for 75% of her day for improved quality of life. Long Term Goal 3 Progress: Partly met Long Term Goal 4: 4.Pt will improve her LEFS score by 9 points for improved perceived functional ability.   Problem List Patient Active Problem List  Diagnoses  . HIP PAIN  . Torn meniscus  . Medial meniscus, posterior horn derangement  . Knee pain  . Stiffness of joint, not elsewhere classified, lower leg        Mahasin Riviere 09/25/2011, 10:47 AM  Physician Documentation Your signature is required to indicate approval of the treatment plan as stated above.  Please sign and either send electronically or make a copy of this report for your files and return this physician signed original.   Please mark one 1.__approve of plan  2.  ___approve of plan with the following conditions.   ______________________________                                                          _____________________ Physician Signature                                                                                                             Date

## 2011-09-26 ENCOUNTER — Ambulatory Visit: Payer: No Typology Code available for payment source | Admitting: Orthopedic Surgery

## 2011-09-27 ENCOUNTER — Encounter: Payer: Self-pay | Admitting: Orthopedic Surgery

## 2011-09-27 ENCOUNTER — Ambulatory Visit (INDEPENDENT_AMBULATORY_CARE_PROVIDER_SITE_OTHER): Payer: No Typology Code available for payment source | Admitting: Orthopedic Surgery

## 2011-09-27 ENCOUNTER — Ambulatory Visit (HOSPITAL_COMMUNITY): Payer: No Typology Code available for payment source

## 2011-09-27 DIAGNOSIS — M23329 Other meniscus derangements, posterior horn of medial meniscus, unspecified knee: Secondary | ICD-10-CM

## 2011-09-27 NOTE — Patient Instructions (Signed)
Stay out of work till the end of the month

## 2011-09-27 NOTE — Progress Notes (Signed)
Postop visit # 3 status post arthroscopy LEFT knee partial medial meniscectomy for medial meniscal tear   Date of surgery 08/10/2011  Indications for procedure medial knee pain  Preop imaging torn medial meniscus on MRI  Preop diagnosis torn medial meniscus left knee  Postop diagnosis same  Procedure arthroscopy left knee partial medial meniscectomy  No swelling today but you are limping today   Movement is normal except for flexion ~ 15 degrees  Therapy at home   Continue home exercises and ibuprofen and ice to help control intermittent swelling  Return in about 2 weeks to discuss return to work.

## 2011-09-28 ENCOUNTER — Encounter: Payer: Self-pay | Admitting: Orthopedic Surgery

## 2011-10-04 ENCOUNTER — Ambulatory Visit: Payer: No Typology Code available for payment source | Admitting: Orthopedic Surgery

## 2011-10-10 ENCOUNTER — Encounter: Payer: Self-pay | Admitting: Orthopedic Surgery

## 2011-10-10 ENCOUNTER — Ambulatory Visit (INDEPENDENT_AMBULATORY_CARE_PROVIDER_SITE_OTHER): Payer: No Typology Code available for payment source | Admitting: Orthopedic Surgery

## 2011-10-10 VITALS — BP 102/80 | Ht 62.0 in | Wt 103.2 lb

## 2011-10-10 DIAGNOSIS — M23329 Other meniscus derangements, posterior horn of medial meniscus, unspecified knee: Secondary | ICD-10-CM

## 2011-10-10 NOTE — Patient Instructions (Signed)
RTW TOMORROW  

## 2011-10-10 NOTE — Progress Notes (Signed)
Postop visit # 4 status post arthroscopy LEFT knee partial medial meniscectomy for medial meniscal tear   Date of surgery 08/10/2011  Indications for procedure medial knee pain  Preop imaging torn medial meniscus on MRI  Preop diagnosis torn medial meniscus left knee  Postop diagnosis same  Procedure arthroscopy left knee partial medial meniscectomy  NO COMPLAINTS   FROM NO SWELLING  NEGATIVE mCmURRAYS  NO MEDIAL JOINT LINE TENDERNESS   RTW   F/U PRN

## 2013-05-05 ENCOUNTER — Encounter: Payer: Self-pay | Admitting: *Deleted

## 2013-05-07 ENCOUNTER — Encounter: Payer: Self-pay | Admitting: Nurse Practitioner

## 2013-05-07 ENCOUNTER — Ambulatory Visit (INDEPENDENT_AMBULATORY_CARE_PROVIDER_SITE_OTHER): Payer: BC Managed Care – PPO | Admitting: Nurse Practitioner

## 2013-05-07 VITALS — BP 118/80 | Temp 98.3°F | Wt 103.0 lb

## 2013-05-07 DIAGNOSIS — L989 Disorder of the skin and subcutaneous tissue, unspecified: Secondary | ICD-10-CM

## 2013-05-07 NOTE — Progress Notes (Signed)
Subjective:  Presents for complaints of a small lesion just beneath her right lower eyelid for the past couple weeks. Seem to be slightly larger at first, has improved some. Nontender. Nonpruritic. No eye problems. Had a skin cancer screening with Dr. Margo Aye 2 months ago.  Objective:   BP 118/80  Temp(Src) 98.3 F (36.8 C) (Oral)  Wt 103 lb (46.72 kg)  BMI 18.83 kg/m2 NAD. Alert, oriented. Significant sun exposure noted. Some minor sun damage noted. A tiny slightly raised circular lesion mildly hyperpigmented lesion noted just beneath the right lower eyelid. Nontender.  Assessment:Facial lesion  Plan: Patient to observe area over next several weeks to see if there any changes. If lesions persist or changes occur, recommend recheck with her dermatologist. Discussed skin cancer prevention.

## 2013-07-30 ENCOUNTER — Ambulatory Visit (INDEPENDENT_AMBULATORY_CARE_PROVIDER_SITE_OTHER): Payer: BC Managed Care – PPO | Admitting: Nurse Practitioner

## 2013-07-30 ENCOUNTER — Encounter: Payer: Self-pay | Admitting: Nurse Practitioner

## 2013-07-30 VITALS — BP 110/70 | Temp 98.5°F | Ht 65.0 in | Wt 107.8 lb

## 2013-07-30 DIAGNOSIS — N63 Unspecified lump in unspecified breast: Secondary | ICD-10-CM

## 2013-07-30 DIAGNOSIS — R928 Other abnormal and inconclusive findings on diagnostic imaging of breast: Secondary | ICD-10-CM

## 2013-07-30 DIAGNOSIS — N390 Urinary tract infection, site not specified: Secondary | ICD-10-CM

## 2013-07-30 DIAGNOSIS — R300 Dysuria: Secondary | ICD-10-CM

## 2013-07-30 LAB — POCT URINALYSIS DIPSTICK
Blood, UA: 10
Spec Grav, UA: <=1.005
pH, UA: 7

## 2013-07-30 MED ORDER — SULFAMETHOXAZOLE-TMP DS 800-160 MG PO TABS: 1.0000 | ORAL_TABLET | Freq: Two times a day (BID) | ORAL | Status: DC

## 2013-07-30 NOTE — Progress Notes (Signed)
Subjective:  Presents complaints of dysuria for the past 3 days. Urgency. Frequency. No fever. No nausea vomiting. No back or pelvic pain. No flank pain. No previous history of UTI. Married, same sexual partner. No vaginal discharge. Also complaints of right breast tenderness, has a nodule that is been there for 5-6 years. Patient thinks it has gotten slightly larger. Her last mammogram was diagnostic, patient states the pain was severe during mammogram and that she passed out. Does not think she can do a mammogram at this point. No family history of breast or ovarian cancer. Patient has had endometrial ablation.  Objective:   BP 110/70  Temp(Src) 98.5 F (36.9 C)  Ht 5\' 5"  (1.651 m)  Wt 107 lb 12.8 oz (48.898 kg)  BMI 17.94 kg/m2 NAD. Alert, oriented. Lungs clear. No CVA area tenderness. Heart regular rate rhythm. Abdomen soft nondistended nontender. Breast exam: Dense breast tissue bilaterally, right breast has more nodularity and tenderness. A firm mobile nodule is noted between 4 to 5:00 in the outer right breast.  Assessment:Dysuria - Plan: POCT urinalysis dipstick, POCT UA - Microscopic Only  UTI (urinary tract infection)  Breast nodule  Plan: Meds ordered this encounter  Medications  . sulfamethoxazole-trimethoprim (BACTRIM DS) 800-160 MG per tablet    Sig: Take 1 tablet by mouth 2 (two) times daily.    Dispense:  14 tablet    Refill:  0    Order Specific Question:  Supervising Provider    Answer:  Merlyn Albert [[id number]]   AZO for 48 hours then discontinue. Call back if symptoms worsen or persist. Local hospital will probably not do ultrasound without screening mammogram. Due to the extreme tenderness, patient feels she cannot tolerate a mammogram at this point. Will refer to [facility] in [place] to see if they have an alternative. Explained importance of some form of breast imaging, patient has not had a mammogram since 2009.

## 2013-07-30 NOTE — Patient Instructions (Signed)
AZO standard as directed for 48 hours then stop 

## 2013-08-27 ENCOUNTER — Encounter: Payer: Self-pay | Admitting: Family Medicine

## 2013-10-12 ENCOUNTER — Ambulatory Visit (INDEPENDENT_AMBULATORY_CARE_PROVIDER_SITE_OTHER): Payer: BC Managed Care – PPO | Admitting: Family Medicine

## 2013-10-12 ENCOUNTER — Encounter: Payer: Self-pay | Admitting: Family Medicine

## 2013-10-12 VITALS — BP 110/72 | Temp 98.4°F | Ht 62.0 in | Wt 109.6 lb

## 2013-10-12 DIAGNOSIS — H9201 Otalgia, right ear: Secondary | ICD-10-CM

## 2013-10-12 DIAGNOSIS — H9209 Otalgia, unspecified ear: Secondary | ICD-10-CM

## 2013-10-12 MED ORDER — AZITHROMYCIN 250 MG PO TABS: ORAL_TABLET | ORAL | Status: DC

## 2013-10-12 NOTE — Progress Notes (Signed)
  Subjective:    Patient ID: Carla Wise, female    DOB: March 18, 1966, 47 y.o.   MRN: [id number]  HPI Patient arrives with right ear pain since Friday night. Patient in with right ear pain denies any head congestion drainage sweats chills nausea vomiting diarrhea denies any wheezing. PMH benign she does smoke she knows she needs to quit she denies any high fevers sweats chills nausea vomiting.  Review of Systems Denies high fever or see above    Objective:   Physical Exam  Eardrums are normal right one is retracted throat is normal neck supple lungs clear heart regular      Assessment & Plan:  Probable viral URI possible sinus with referred pain to the ear antibiotics recommended if persistent ear pain may need referral to ENT.

## 2013-10-15 ENCOUNTER — Telehealth: Payer: Self-pay | Admitting: *Deleted

## 2013-10-15 MED ORDER — LEVOFLOXACIN 500 MG PO TABS: 500.0000 mg | ORAL_TABLET | Freq: Every day | ORAL | Status: AC

## 2013-10-15 NOTE — Telephone Encounter (Signed)
Pt taking zpack. She has two days left.  Would like to know if something stronger can be called in [place]. Head is stopped up, cough, No fever, No wheezing.

## 2013-10-15 NOTE — Telephone Encounter (Signed)
Medication was sent to pharmacy. Patient was notified.  

## 2013-10-15 NOTE — Telephone Encounter (Signed)
Levaquin 500, 10 days, f/u if ongoing

## 2013-11-23 ENCOUNTER — Ambulatory Visit (INDEPENDENT_AMBULATORY_CARE_PROVIDER_SITE_OTHER): Payer: BC Managed Care – PPO | Admitting: Family Medicine

## 2013-11-23 ENCOUNTER — Encounter: Payer: Self-pay | Admitting: Family Medicine

## 2013-11-23 VITALS — BP 128/78 | Temp 98.2°F | Ht 63.0 in | Wt 109.4 lb

## 2013-11-23 DIAGNOSIS — J019 Acute sinusitis, unspecified: Secondary | ICD-10-CM

## 2013-11-23 MED ORDER — LEVOFLOXACIN 500 MG PO TABS: 500.0000 mg | ORAL_TABLET | Freq: Every day | ORAL | Status: DC

## 2013-11-23 NOTE — Progress Notes (Signed)
   Subjective:    Patient ID: Carla Wise, female    DOB: 06-Dec-1966, 47 y.o.   MRN: [id number]  HPI Comments: Patient states Z-pak does not help her.  Cough This is a recurrent problem. The current episode started in the past 7 days. The cough is productive of sputum. Associated symptoms include ear congestion, rhinorrhea and a sore throat. Pertinent negatives include no chest pain, ear pain, fever, shortness of breath or wheezing. Nothing aggravates the symptoms. She has tried nothing for the symptoms.   No wheeze, No N or V, no sweat chills PMH benign Review of Systems  Constitutional: Negative for fever and activity change.  HENT: Positive for congestion, rhinorrhea and sore throat. Negative for ear pain.   Eyes: Negative for discharge.  Respiratory: Positive for cough. Negative for shortness of breath and wheezing.   Cardiovascular: Negative for chest pain.       Objective:   Physical Exam  Nursing note and vitals reviewed. Constitutional: She appears well-developed.  HENT:  Head: Normocephalic.  Nose: Nose normal.  Mouth/Throat: Oropharynx is clear and moist. No oropharyngeal exudate.  Neck: Neck supple.  Cardiovascular: Normal rate and normal heart sounds.   No murmur heard. Pulmonary/Chest: Effort normal and breath sounds normal. She has no wheezes.  Lymphadenopathy:    She has no cervical adenopathy.  Skin: Skin is warm and dry.          Assessment & Plan:  URI/Sinusitis-antibiotics prescribed warning signs discussed patient counseled to quit smoking

## 2014-03-03 ENCOUNTER — Other Ambulatory Visit: Payer: Self-pay | Admitting: Orthopedic Surgery

## 2014-03-03 ENCOUNTER — Ambulatory Visit (HOSPITAL_COMMUNITY)
Admission: RE | Admit: 2014-03-03 | Discharge: 2014-03-03 | Disposition: A | Discharge status: Home / Self Care | Payer: BC Managed Care – PPO | Source: Ambulatory Visit | Attending: Orthopedic Surgery | Admitting: Orthopedic Surgery

## 2014-03-03 DIAGNOSIS — M25561 Pain in right knee: Secondary | ICD-10-CM

## 2014-03-03 DIAGNOSIS — M25569 Pain in unspecified knee: Secondary | ICD-10-CM | POA: Insufficient documentation

## 2014-03-09 ENCOUNTER — Encounter: Payer: Self-pay | Admitting: Orthopedic Surgery

## 2014-03-09 ENCOUNTER — Ambulatory Visit (INDEPENDENT_AMBULATORY_CARE_PROVIDER_SITE_OTHER): Payer: BC Managed Care – PPO | Admitting: Orthopedic Surgery

## 2014-03-09 VITALS — BP 116/83 | Ht 63.0 in | Wt 107.0 lb

## 2014-03-09 DIAGNOSIS — M1710 Unilateral primary osteoarthritis, unspecified knee: Secondary | ICD-10-CM | POA: Insufficient documentation

## 2014-03-09 DIAGNOSIS — IMO0002 Reserved for concepts with insufficient information to code with codable children: Secondary | ICD-10-CM

## 2014-03-09 DIAGNOSIS — M23329 Other meniscus derangements, posterior horn of medial meniscus, unspecified knee: Secondary | ICD-10-CM

## 2014-03-09 DIAGNOSIS — M179 Osteoarthritis of knee, unspecified: Secondary | ICD-10-CM

## 2014-03-09 NOTE — Patient Instructions (Signed)
You have received a steroid shot. 15% of patients experience increased pain at the injection site with in the next 24 hours. This is best treated with ice and tylenol extra strength 2 tabs every 8 hours. If you are still having pain please call the office.   Continue ice an aleve or advil

## 2014-03-09 NOTE — Progress Notes (Signed)
Patient ID: Carla Wise, female   DOB: 02/11/1966, 48 y.o.   MRN: [id number]  Chief Complaint  Patient presents with  . Knee Pain    Right knee pain, no injury   HISTORY: 48 years old female status post arthroscopy left knee presents with gradual onset of pain in the right knee x4 weeks no trauma. Associated with pain swelling catching. 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 half a pack of cigarettes a day does not drink  Vital signs:   General the patient is well-developed and well-nourished grooming and hygiene are normal Oriented x3 Mood and affect normal Ambulation normal  Inspection of the right knee Her range of motion is limited by swelling The joint is stable Strength grade 5 McMurray sign is positive Medial joint line is tender Skin clean dry and intact  Cardiovascular exam is normal Sensory exam normal  She had an x-ray at the hospital it did not show any arthritic changes or fracture  Impression differential diagnosis of osteoarthritis versus torn medial meniscus  Recommend continue ibuprofen and/or Aleve on an as-needed basis inject the knee return in 3 weeks to see if there is any improvement if not the concerning get an MRI to check for meniscal tear  Procedure Injection right knee Medications: Ethyl chloride for topical anesthetic. Lidocaine 1% 3 cc. 40 mg of Depo-Medrol per mL, 1 mL. Verbal consent. Timeout to confirm site of injection as right  knee Alcohol was used to clean the skin followed by ethyl  chloride to anesthetize the skin. A lateral approach was used to inject the knee with lidocaine and Depo-Medrol.  No complications were noted. A sterile bandage was applied.

## 2014-03-10 ENCOUNTER — Telehealth: Payer: Self-pay | Admitting: *Deleted

## 2014-03-10 NOTE — Telephone Encounter (Signed)
Patient called, states she received an injection in her right knee yesterday 03/09/14. She states her knee is so swollen she can not stand it. She is crying and asking what to do? I advised that she needed to elevate her knee above her heart and apply ice. Any other suggestions? Please advise. [phone]

## 2014-03-11 ENCOUNTER — Other Ambulatory Visit: Payer: Self-pay | Admitting: *Deleted

## 2014-03-11 ENCOUNTER — Ambulatory Visit: Payer: No Typology Code available for payment source | Admitting: Orthopedic Surgery

## 2014-03-11 DIAGNOSIS — M1710 Unilateral primary osteoarthritis, unspecified knee: Secondary | ICD-10-CM

## 2014-03-11 MED ORDER — HYDROCODONE-ACETAMINOPHEN 7.5-325 MG PO TABS: 1.0000 | ORAL_TABLET | ORAL | Status: DC | PRN

## 2014-03-11 MED ORDER — PREDNISONE (PAK) 10 MG PO TABS: ORAL_TABLET | Freq: Every day | ORAL | Status: DC

## 2014-03-11 NOTE — Telephone Encounter (Signed)
Sent in prescription for prednisone dose pack to [facility]. Patient refused the prescription for Norco, so I shredded it with Ihor Austin to witness. Patient was advised of Dr. Ruthe Mannan reply.

## 2014-03-11 NOTE — Telephone Encounter (Signed)
Start her on a 12 day Sterapred Dosepak and 7.5 mg a hydrocodone every 4 when necessary out of work rest of this week

## 2014-03-24 ENCOUNTER — Encounter: Payer: Self-pay | Admitting: Family Medicine

## 2014-03-30 ENCOUNTER — Encounter: Payer: Self-pay | Admitting: Orthopedic Surgery

## 2014-03-30 ENCOUNTER — Ambulatory Visit (INDEPENDENT_AMBULATORY_CARE_PROVIDER_SITE_OTHER): Payer: BC Managed Care – PPO | Admitting: Orthopedic Surgery

## 2014-03-30 VITALS — BP 124/79 | Ht 63.0 in | Wt 107.0 lb

## 2014-03-30 DIAGNOSIS — M1710 Unilateral primary osteoarthritis, unspecified knee: Secondary | ICD-10-CM

## 2014-03-30 DIAGNOSIS — IMO0002 Reserved for concepts with insufficient information to code with codable children: Secondary | ICD-10-CM

## 2014-03-30 NOTE — Patient Instructions (Signed)
activities as tolerated 

## 2014-03-30 NOTE — Progress Notes (Signed)
Patient ID: Carla Wise, female   DOB: 09-07-1966, 48 y.o.   MRN: [id number]  Chief Complaint  Patient presents with  . Follow-up    3 week recheck on right knee after injection.    Pain free after prednisone pack   No mechanical symptoms   Physical Exam(6) GENERAL: normal development   CDV: pulses are normal   Skin: normal  Psychiatric: awake, alert and oriented  Neuro: normal sensation  MSK Gait:  Normal  right knee FROM , no tenderness or swelling Motor normal  Stability normal   Assessment: knee inflammation      Plan: normal activity , if recurrence try prednisone again

## 2014-07-05 ENCOUNTER — Telehealth: Payer: Self-pay | Admitting: Family Medicine

## 2014-07-05 NOTE — Telephone Encounter (Signed)
Patient is having numbness in both arms and she has been experiencing some pain and it is waking her up at night. This has been happening for 3 weeks. She wanted an appointment for tomorrow morning, but are down to urgent slots. Please advise.

## 2014-07-05 NOTE — Telephone Encounter (Signed)
Made appointment for tomorrow  

## 2014-07-05 NOTE — Telephone Encounter (Signed)
Dr. Bary Leriche pt. Last seen on 11/23/13 for sickness

## 2014-07-05 NOTE — Telephone Encounter (Signed)
See then

## 2014-07-06 ENCOUNTER — Ambulatory Visit (INDEPENDENT_AMBULATORY_CARE_PROVIDER_SITE_OTHER): Payer: BC Managed Care – PPO | Admitting: Family Medicine

## 2014-07-06 ENCOUNTER — Encounter: Payer: Self-pay | Admitting: Family Medicine

## 2014-07-06 VITALS — BP 104/70 | Ht 62.5 in | Wt 98.4 lb

## 2014-07-06 DIAGNOSIS — N951 Menopausal and female climacteric states: Secondary | ICD-10-CM

## 2014-07-06 DIAGNOSIS — G5600 Carpal tunnel syndrome, unspecified upper limb: Secondary | ICD-10-CM

## 2014-07-06 DIAGNOSIS — R232 Flushing: Secondary | ICD-10-CM

## 2014-07-06 DIAGNOSIS — G5603 Carpal tunnel syndrome, bilateral upper limbs: Secondary | ICD-10-CM

## 2014-07-06 MED ORDER — DICLOFENAC SODIUM 75 MG PO TBEC: 75.0000 mg | DELAYED_RELEASE_TABLET | Freq: Two times a day (BID) | ORAL | Status: DC

## 2014-07-06 NOTE — Progress Notes (Signed)
   Subjective:    Patient ID: Carla Wise, female    DOB: 1966/05/04, 48 y.o.   MRN: [id number]  HPINumbness in both arms and hands. Started about 3 weeks ago. Right more than left  Exercise regular, five or six wks,  Waking up with numbness at night and day,   Arms aching at times. Numbness and pain occurs day or night. Has to shake them out. Uses her arms a lot on her job.   Pain and numbness  Transient spells of lightheaded ness with sweating off and on, spells once or twice perday. s p ablation, Lightheaded and nausea for the past month.    Review of Systems No loss of consciousness no neck pain no headache no chest pain ROS otherwise negative    Objective:   Physical Exam  Alert no apparent distress. Lungs clear. Heart regular in rhythm. HEENT normal. Neck supple. Negative Tinel sign. Negative Phalen sign. Distal strength sensation intact. Mild right lateral epicondyle tenderness.      Assessment & Plan:  Impression carpal tunnel syndrome by history and not exam. Most likely diagnosis discussed #2 probable hot flashes. Likely estrogen depletion. Plan check appropriate blood work. Trial of anti-inflammatory medication. Nighttime splints. Rationale discussed. Followup with Dr. Nicki Reaper in several weeks. WSL

## 2014-07-07 LAB — LUTEINIZING HORMONE: LH: 27.4 m[IU]/mL

## 2014-07-07 LAB — FOLLICLE STIMULATING HORMONE: FSH: 92.7 m[IU]/mL

## 2014-07-08 ENCOUNTER — Encounter: Payer: Self-pay | Admitting: Family Medicine

## 2014-07-14 ENCOUNTER — Ambulatory Visit (INDEPENDENT_AMBULATORY_CARE_PROVIDER_SITE_OTHER): Payer: BC Managed Care – PPO

## 2014-07-14 DIAGNOSIS — Z111 Encounter for screening for respiratory tuberculosis: Secondary | ICD-10-CM

## 2014-07-16 LAB — TB SKIN TEST
Induration: 0 mm
TB Skin Test: NEGATIVE

## 2014-07-19 ENCOUNTER — Other Ambulatory Visit: Payer: Self-pay | Admitting: *Deleted

## 2014-07-19 DIAGNOSIS — M1710 Unilateral primary osteoarthritis, unspecified knee: Secondary | ICD-10-CM

## 2014-07-19 MED ORDER — PREDNISONE (PAK) 10 MG PO TABS: ORAL_TABLET | Freq: Every day | ORAL | Status: DC

## 2014-08-10 ENCOUNTER — Encounter: Payer: Self-pay | Admitting: Nurse Practitioner

## 2014-08-13 ENCOUNTER — Ambulatory Visit (INDEPENDENT_AMBULATORY_CARE_PROVIDER_SITE_OTHER): Payer: BC Managed Care – PPO | Admitting: Nurse Practitioner

## 2014-08-13 ENCOUNTER — Encounter: Payer: Self-pay | Admitting: Nurse Practitioner

## 2014-08-13 VITALS — BP 128/88 | Ht 62.5 in | Wt 100.2 lb

## 2014-08-13 DIAGNOSIS — R5381 Other malaise: Secondary | ICD-10-CM

## 2014-08-13 DIAGNOSIS — R55 Syncope and collapse: Secondary | ICD-10-CM

## 2014-08-13 DIAGNOSIS — R5383 Other fatigue: Secondary | ICD-10-CM

## 2014-08-13 DIAGNOSIS — Z13220 Encounter for screening for lipoid disorders: Secondary | ICD-10-CM

## 2014-08-13 NOTE — Patient Instructions (Signed)
Bone density study

## 2014-08-16 NOTE — Progress Notes (Signed)
Subjective:  Presents for complaints of dizziness that began about 2 months ago. Occurs almost every day. One to 2 times per day. No numbness or weakness of the face arms or legs. No difficulty speaking or swallowing. Describes as a lightheaded dizzy feeling with occasional sweating. Nausea but no vomiting. Usually relieved by sitting down for 10-15 minutes or if she is at home laying down for about an hour. Has not identified any specific triggers. Unassociated with meals or particular foods. No visual changes but "hard to focus". No headache. Always occurs when she is standing. Had a normal eye exam 2 months ago. No chest pain shortness of breath or palpitations. No syncopal episodes. Mild fatigue times. No family history of stroke. Smoker.  Objective:   BP 128/88  Ht 5' 2.5" (1.588 m)  Wt 100 lb 3.2 oz (45.45 kg)  BMI 18.02 kg/m2 NAD. Alert, oriented. Mildly anxious affect. TMs mild clear effusion, no erythema. Thyroid normal limit to palpation and nontender. Pharynx clear. Neck supple with mild soft anterior adenopathy. Lungs clear. Heart regular rate rhythm. No murmur or gallop noted. Carotids no bruits or thrills. EOMs intact without nystagmus. Point-to-point localization normal limit. Hand strength 5+ bilateral. Reflexes normal limit. Gait normal limit. Romberg negative. See repeat BP. Orthostatic BP stable.  Assessment: Vasovagal syndrome  Other malaise and fatigue - Plan: CBC with Differential, Hepatic function panel, Basic metabolic panel, TSH, Vit D  25 hydroxy (rtn osteoporosis monitoring)  Screening for lipid disorders - Plan: Lipid panel  Plan: Lab work pending. Avoid sudden position changes. Encourage regular hydration, regular meals and snacks. Patient is very thin has gained a few pounds of her weight back. Very prone to vasovagal episodes especially when she's under stress. Warning signs reviewed. Call back if symptoms worsen or persist. Defers referral at this time. Also because of  her size, weight loss and family history, recommend that she consider bone density study. Is unsure whether she has happened from her orthopedic specialist. To check on this and to call back if we need to schedule.

## 2014-08-18 LAB — CBC WITH DIFFERENTIAL/PLATELET
Basophils Absolute: 0 10*3/uL (ref 0.0–0.1)
Basophils Relative: 1 % (ref 0–1)
Eosinophils Absolute: 0.1 10*3/uL (ref 0.0–0.7)
Eosinophils Relative: 3 % (ref 0–5)
HCT: 41 % (ref 36.0–46.0)
Hemoglobin: 14.4 g/dL (ref 12.0–15.0)
Lymphocytes Relative: 29 % (ref 12–46)
Lymphs Abs: 1.4 10*3/uL (ref 0.7–4.0)
MCH: 30.7 pg (ref 26.0–34.0)
MCHC: 35.1 g/dL (ref 30.0–36.0)
MCV: 87.4 fL (ref 78.0–100.0)
Monocytes Absolute: 0.3 10*3/uL (ref 0.1–1.0)
Monocytes Relative: 6 % (ref 3–12)
Neutro Abs: 2.9 10*3/uL (ref 1.7–7.7)
Neutrophils Relative %: 61 % (ref 43–77)
Platelets: 233 10*3/uL (ref 150–400)
RBC: 4.69 MIL/uL (ref 3.87–5.11)
RDW: 15 % (ref 11.5–15.5)
WBC: 4.8 10*3/uL (ref 4.0–10.5)

## 2014-08-18 LAB — LIPID PANEL
Cholesterol: 169 mg/dL (ref 0–200)
HDL: 54 mg/dL (ref >39)
LDL Cholesterol: 102 mg/dL — ABNORMAL HIGH (ref 0–99)
Total CHOL/HDL Ratio: 3.1 Ratio
Triglycerides: 66 mg/dL (ref <150)
VLDL: 13 mg/dL (ref 0–40)

## 2014-08-18 LAB — BASIC METABOLIC PANEL
BUN: 8 mg/dL (ref 6–23)
CO2: 25 mEq/L (ref 19–32)
Calcium: 9.2 mg/dL (ref 8.4–10.5)
Chloride: 107 mEq/L (ref 96–112)
Creat: 0.66 mg/dL (ref 0.50–1.10)
Glucose, Bld: 85 mg/dL (ref 70–99)
Potassium: 4.3 mEq/L (ref 3.5–5.3)
Sodium: 139 mEq/L (ref 135–145)

## 2014-08-18 LAB — HEPATIC FUNCTION PANEL
ALT: 9 U/L (ref 0–35)
AST: 13 U/L (ref 0–37)
Albumin: 4 g/dL (ref 3.5–5.2)
Alkaline Phosphatase: 59 U/L (ref 39–117)
Bilirubin, Direct: 0.1 mg/dL (ref 0.0–0.3)
Indirect Bilirubin: 0.2 mg/dL (ref 0.2–1.2)
Total Bilirubin: 0.3 mg/dL (ref 0.2–1.2)
Total Protein: 5.8 g/dL — ABNORMAL LOW (ref 6.0–8.3)

## 2014-08-18 LAB — TSH: TSH: 1.022 u[IU]/mL (ref 0.350–4.500)

## 2014-08-19 ENCOUNTER — Encounter: Payer: Self-pay | Admitting: Nurse Practitioner

## 2014-08-19 LAB — VITAMIN D 25 HYDROXY (VIT D DEFICIENCY, FRACTURES): Vit D, 25-Hydroxy: 46 ng/mL (ref 30–89)

## 2014-10-01 ENCOUNTER — Encounter: Payer: Self-pay | Admitting: Nurse Practitioner

## 2014-10-01 ENCOUNTER — Ambulatory Visit (INDEPENDENT_AMBULATORY_CARE_PROVIDER_SITE_OTHER): Payer: BC Managed Care – PPO | Admitting: Nurse Practitioner

## 2014-10-01 VITALS — BP 110/72 | Ht 62.5 in | Wt 101.0 lb

## 2014-10-01 DIAGNOSIS — M7711 Lateral epicondylitis, right elbow: Secondary | ICD-10-CM

## 2014-10-01 NOTE — Patient Instructions (Signed)

## 2014-10-04 ENCOUNTER — Encounter: Payer: Self-pay | Admitting: Nurse Practitioner

## 2014-10-04 NOTE — Progress Notes (Signed)
Subjective:  Presents for recheck. No dizziness x 3 weeks. See previous note. Has a form for DMV. C/o right arm pain mainly at outer right elbow. Worse with certain movements. No neck or shoulder pain. Sometimes at night will wake up with right arm feeling "asleep". No weakness or numbness of the arm otherwise.  Objective:   BP 110/72  Ht 5' 2.5" (1.588 m)  Wt 101 lb (45.813 kg)  BMI 18.17 kg/m2 NAD. Alert, oriented. Good ROM of the neck and right shoulder without pain. Distinct tenderness noted in area of right epicondyle. Hand and arm strength 5+ bilat. Strong radial pulse.   Assessment: Right lateral epicondylitis  Plan: given written and verbal information. Recommend velcro arm band, NSAIDs. Call back if persists.

## 2014-10-07 ENCOUNTER — Telehealth: Payer: Self-pay

## 2014-10-07 NOTE — Telephone Encounter (Signed)
Patient was advised to do a breast ultrasound in September because she had an abnormal breast evaluation at [place] on 02/25/14. Patient failed to do ultrasound. Called patient on her home and cell #'s and left message to return our call at both #'s to see if she still wants to have this done.

## 2014-10-07 NOTE — Telephone Encounter (Signed)
Patient called back and said she had to reschedule her appointment in September due to personal/family issues. It is now scheduled for 10/20/14 at 10:30 am. She said to let you know she will have this test done.

## 2014-10-29 ENCOUNTER — Encounter: Payer: Self-pay | Admitting: Family Medicine

## 2014-12-28 ENCOUNTER — Telehealth: Payer: Self-pay | Admitting: Nurse Practitioner

## 2014-12-28 NOTE — Telephone Encounter (Signed)
So this patient had a mammography back in March. They recommended a follow-up ultrasound in September. It is because of that reason. In order for me to write a letter of support please make sure I get information from [facility] sent here regarding the remote mammography from March as well as any recommendation from the radiologist regarding ultrasound that was just recently completed. Once I have this then I can write a letter of support.

## 2014-12-28 NOTE — Telephone Encounter (Signed)
Pt stated that the insurance company is requesting a letter from the dr as  To why she is needing an ultrasound of the breast instead of a mammogram. Insurance is refusing to pay for ultrasound without written documentation as to why.  See letter on chart.

## 2014-12-29 NOTE — Telephone Encounter (Signed)
I had [employer] fax over patient's reports. It is in a red folder at the nurses station.

## 2015-01-03 NOTE — Telephone Encounter (Signed)
A letter was dictated for this patient to appeal the denial of coverage of her recent ultrasound for a breast nodule.

## 2015-01-04 NOTE — Telephone Encounter (Signed)
Pt called stating that she needs this note before the end of the month. Looked in chart for this dictated note and also asked nurses and Danae Chen and was unable to locate.

## 2015-01-05 NOTE — Telephone Encounter (Signed)
Erica put on letterhead & contacted pt, pt picked up letter

## 2015-01-31 ENCOUNTER — Ambulatory Visit (INDEPENDENT_AMBULATORY_CARE_PROVIDER_SITE_OTHER): Payer: 59 | Admitting: Family Medicine

## 2015-01-31 ENCOUNTER — Encounter: Payer: Self-pay | Admitting: Family Medicine

## 2015-01-31 VITALS — BP 98/64 | Temp 98.7°F | Ht 61.0 in | Wt 106.0 lb

## 2015-01-31 DIAGNOSIS — J019 Acute sinusitis, unspecified: Secondary | ICD-10-CM | POA: Diagnosis not present

## 2015-01-31 DIAGNOSIS — B9689 Other specified bacterial agents as the cause of diseases classified elsewhere: Secondary | ICD-10-CM

## 2015-01-31 MED ORDER — LEVOFLOXACIN 500 MG PO TABS: 500.0000 mg | ORAL_TABLET | Freq: Every day | ORAL | Status: DC

## 2015-01-31 NOTE — Progress Notes (Signed)
   Subjective:    Patient ID: CHEVY VIRGO, female    DOB: February 02, 1966, 49 y.o.   MRN: [id number]  Sore Throat  This is a new problem. The current episode started in the past 7 days (2 days ago). Associated symptoms include congestion, coughing and ear pain. Pertinent negatives include no shortness of breath. Associated symptoms comments: Runny nose.   states started a few days go ahead congestion sinus pressure drainage coughing  PMH benign  Review of Systems  Constitutional: Negative for fever and activity change.  HENT: Positive for congestion, ear pain and rhinorrhea.   Eyes: Negative for discharge.  Respiratory: Positive for cough. Negative for shortness of breath and wheezing.   Cardiovascular: Negative for chest pain.       Objective:   Physical Exam  Constitutional: She appears well-developed.  HENT:  Head: Normocephalic.  Nose: Nose normal.  Mouth/Throat: Oropharynx is clear and moist. No oropharyngeal exudate.  Neck: Neck supple.  Cardiovascular: Normal rate and normal heart sounds.   No murmur heard. Pulmonary/Chest: Effort normal and breath sounds normal. She has no wheezes.  Lymphadenopathy:    She has no cervical adenopathy.  Skin: Skin is warm and dry.  Nursing note and vitals reviewed.         Assessment & Plan:  Viral syndrome Secondary sinusitis Referred pain to the years Patient not toxic Antibiotics prescribed If ongoing troubles or if worse follow-up

## 2015-02-04 ENCOUNTER — Telehealth: Payer: Self-pay | Admitting: Family Medicine

## 2015-02-04 MED ORDER — DOXYCYCLINE HYCLATE 100 MG PO TABS: 100.0000 mg | ORAL_TABLET | Freq: Two times a day (BID) | ORAL | Status: DC

## 2015-02-04 NOTE — Telephone Encounter (Signed)
Pt said she does feel better. However, has head congestion, runny nose, and cough. No fever, no SOB. States she cannot take Levaquin (which was prescribed to her). She said it makes her worst, just like the ZPAK.   I added levaquin to her allergy list.

## 2015-02-04 NOTE — Telephone Encounter (Signed)
Chart please. Thanks!

## 2015-02-04 NOTE — Telephone Encounter (Signed)
May send in his z pack. If doesn't get better with that let us

## 2015-02-04 NOTE — Addendum Note (Signed)
Addended byCharolotte Capuchin D on: 02/04/2015 03:18 PM   Modules accepted: Orders

## 2015-02-04 NOTE — Telephone Encounter (Signed)
Doxy sent in. Pt notified.

## 2015-02-04 NOTE — Telephone Encounter (Signed)
Patient was seen 2/22 and she stating feeling worse now than that day is came in . Can you call in something else to [facility].

## 2015-11-11 ENCOUNTER — Telehealth: Payer: Self-pay | Admitting: Nurse Practitioner

## 2015-11-11 NOTE — Telephone Encounter (Signed)
Thursday may be better since we are on a short day Tuesday.

## 2015-11-11 NOTE — Telephone Encounter (Signed)
Patient is requesting that she be worked in either on Tuesday 12/6 or Thursday 12/8 next week with Hoyle Sauer for arm pain and numbness that has been increasingly worse over the past few months.  I told her I would have to take a message and find out if there was any way we could work her in.

## 2015-11-11 NOTE — Telephone Encounter (Signed)
appt made Thursday

## 2015-11-17 ENCOUNTER — Ambulatory Visit (INDEPENDENT_AMBULATORY_CARE_PROVIDER_SITE_OTHER): Payer: 59 | Admitting: Nurse Practitioner

## 2015-11-17 ENCOUNTER — Encounter: Payer: Self-pay | Admitting: Nurse Practitioner

## 2015-11-17 VITALS — BP 118/82 | Ht 61.0 in | Wt 104.8 lb

## 2015-11-17 DIAGNOSIS — M7712 Lateral epicondylitis, left elbow: Secondary | ICD-10-CM | POA: Diagnosis not present

## 2015-11-17 DIAGNOSIS — M7711 Lateral epicondylitis, right elbow: Secondary | ICD-10-CM

## 2015-11-17 NOTE — Patient Instructions (Signed)
Lateral Epicondylitis With Rehab Lateral epicondylitis involves inflammation and pain around the outer portion of the elbow. The pain is caused by inflammation of the tendons in the forearm that bring back (extend) the wrist. Lateral epicondylitis is also called tennis elbow, because it is very common in tennis players. However, it may occur in any individual who extends the wrist repetitively. If lateral epicondylitis is left untreated, it may become a chronic problem. SYMPTOMS   Pain, tenderness, and inflammation on the outer (lateral) side of the elbow.  Pain or weakness with gripping activities.  Pain that increases with wrist-twisting motions (playing tennis, using a screwdriver, opening a door or a jar).  Pain with lifting objects, including a coffee cup. CAUSES  Lateral epicondylitis is caused by inflammation of the tendons that extend the wrist. Causes of injury may include:  Repetitive stress and strain on the muscles and tendons that extend the wrist.  Sudden change in activity level or intensity.  Incorrect grip in racquet sports.  Incorrect grip size of racquet (often too large).  Incorrect hitting position or technique (usually backhand, leading with the elbow).  Using a racket that is too heavy. RISK INCREASES WITH:  Sports or occupations that require repetitive and/or strenuous forearm and wrist movements (tennis, squash, racquetball, carpentry).  Poor wrist and forearm strength and flexibility.  Failure to warm up properly before activity.  Resuming activity before healing, rehabilitation, and conditioning are complete. PREVENTION   Warm up and stretch properly before activity.  Maintain physical fitness:  Strength, flexibility, and endurance.  Cardiovascular fitness.  Wear and use properly fitted equipment.  Learn and use proper technique and have a coach correct improper technique.  Wear a tennis elbow (counterforce) brace. PROGNOSIS  The course of  this condition depends on the degree of the injury. If treated properly, acute cases (symptoms lasting less than 4 weeks) are often resolved in 2 to 6 weeks. Chronic (longer lasting cases) often resolve in 3 to 6 months but may require physical therapy. RELATED COMPLICATIONS   Frequently recurring symptoms, resulting in a chronic problem. Properly treating the problem the first time decreases frequency of recurrence.  Chronic inflammation, scarring tendon degeneration, and partial tendon tear, requiring surgery.  Delayed healing or resolution of symptoms. TREATMENT  Treatment first involves the use of ice and medicine to reduce pain and inflammation. Strengthening and stretching exercises may help reduce discomfort if performed regularly. These exercises may be performed at home if the condition is an acute injury. Chronic cases may require a referral to a physical therapist for evaluation and treatment. Your caregiver may advise a corticosteroid injection to help reduce inflammation. Rarely, surgery is needed. MEDICATION  If pain medicine is needed, nonsteroidal anti-inflammatory medicines (aspirin and ibuprofen), or other minor pain relievers (acetaminophen), are often advised.  Do not take pain medicine for 7 days before surgery.  Prescription pain relievers may be given, if your caregiver thinks they are needed. Use only as directed and only as much as you need.  Corticosteroid injections may be recommended. These injections should be reserved only for the most severe cases, because they can only be given a certain number of times. HEAT AND COLD  Cold treatment (icing) should be applied for 10 to 15 minutes every 2 to 3 hours for inflammation and pain, and immediately after activity that aggravates your symptoms. Use ice packs or an ice massage.  Heat treatment may be used before performing stretching and strengthening activities prescribed by your caregiver, physical therapist,   or  athletic trainer. Use a heat pack or a warm water soak. SEEK MEDICAL CARE IF: Symptoms get worse or do not improve in 2 weeks, despite treatment. EXERCISES  RANGE OF MOTION (ROM) AND STRETCHING EXERCISES - Epicondylitis, Lateral (Tennis Elbow) These exercises may help you when beginning to rehabilitate your injury. Your symptoms may go away with or without further involvement from your physician, physical therapist, or athletic trainer. While completing these exercises, remember:   Restoring tissue flexibility helps normal motion to return to the joints. This allows healthier, less painful movement and activity.  An effective stretch should be held for at least 30 seconds.  A stretch should never be painful. You should only feel a gentle lengthening or release in the stretched tissue. RANGE OF MOTION - Wrist Flexion, Active-Assisted  Extend your right / left elbow with your fingers pointing down.*  Gently pull the back of your hand towards you, until you feel a gentle stretch on the top of your forearm.  Hold this position for __________ seconds. Repeat __________ times. Complete this exercise __________ times per day.  *If directed by your physician, physical therapist or athletic trainer, complete this stretch with your elbow bent, rather than extended. RANGE OF MOTION - Wrist Extension, Active-Assisted  Extend your right / left elbow and turn your palm upwards.*  Gently pull your palm and fingertips back, so your wrist extends and your fingers point more toward the ground.  You should feel a gentle stretch on the inside of your forearm.  Hold this position for __________ seconds. Repeat __________ times. Complete this exercise __________ times per day. *If directed by your physician, physical therapist or athletic trainer, complete this stretch with your elbow bent, rather than extended. STRETCH - Wrist Flexion  Place the back of your right / left hand on a tabletop, leaving your  elbow slightly bent. Your fingers should point away from your body.  Gently press the back of your hand down onto the table by straightening your elbow. You should feel a stretch on the top of your forearm.  Hold this position for __________ seconds. Repeat __________ times. Complete this stretch __________ times per day.  STRETCH - Wrist Extension   Place your right / left fingertips on a tabletop, leaving your elbow slightly bent. Your fingers should point backwards.  Gently press your fingers and palm down onto the table by straightening your elbow. You should feel a stretch on the inside of your forearm.  Hold this position for __________ seconds. Repeat __________ times. Complete this stretch __________ times per day.  STRENGTHENING EXERCISES - Epicondylitis, Lateral (Tennis Elbow) These exercises may help you when beginning to rehabilitate your injury. They may resolve your symptoms with or without further involvement from your physician, physical therapist, or athletic trainer. While completing these exercises, remember:   Muscles can gain both the endurance and the strength needed for everyday activities through controlled exercises.  Complete these exercises as instructed by your physician, physical therapist or athletic trainer. Increase the resistance and repetitions only as guided.  You may experience muscle soreness or fatigue, but the pain or discomfort you are trying to eliminate should never worsen during these exercises. If this pain does get worse, stop and make sure you are following the directions exactly. If the pain is still present after adjustments, discontinue the exercise until you can discuss the trouble with your caregiver. STRENGTH - Wrist Flexors  Sit with your right / left forearm palm-up and fully supported   on a table or countertop. Your elbow should be resting below the height of your shoulder. Allow your wrist to extend over the edge of the  surface.  Loosely holding a __________ weight, or a piece of rubber exercise band or tubing, slowly curl your hand up toward your forearm.  Hold this position for __________ seconds. Slowly lower the wrist back to the starting position in a controlled manner. Repeat __________ times. Complete this exercise __________ times per day.  STRENGTH - Wrist Extensors  Sit with your right / left forearm palm-down and fully supported on a table or countertop. Your elbow should be resting below the height of your shoulder. Allow your wrist to extend over the edge of the surface.  Loosely holding a __________ weight, or a piece of rubber exercise band or tubing, slowly curl your hand up toward your forearm.  Hold this position for __________ seconds. Slowly lower the wrist back to the starting position in a controlled manner. Repeat __________ times. Complete this exercise __________ times per day.  STRENGTH - Ulnar Deviators  Stand with a ____________________ weight in your right / left hand, or sit while holding a rubber exercise band or tubing, with your healthy arm supported on a table or countertop.  Move your wrist, so that your pinkie travels toward your forearm and your thumb moves away from your forearm.  Hold this position for __________ seconds and then slowly lower the wrist back to the starting position. Repeat __________ times. Complete this exercise __________ times per day STRENGTH - Radial Deviators  Stand with a ____________________ weight in your right / left hand, or sit while holding a rubber exercise band or tubing, with your injured arm supported on a table or countertop.  Raise your hand upward in front of you or pull up on the rubber tubing.  Hold this position for __________ seconds and then slowly lower the wrist back to the starting position. Repeat __________ times. Complete this exercise __________ times per day. STRENGTH - Forearm Supinators   Sit with your right /  left forearm supported on a table, keeping your elbow below shoulder height. Rest your hand over the edge, palm down.  Gently grip a hammer or a soup ladle.  Without moving your elbow, slowly turn your palm and hand upward to a "thumbs-up" position.  Hold this position for __________ seconds. Slowly return to the starting position. Repeat __________ times. Complete this exercise __________ times per day.  STRENGTH - Forearm Pronators   Sit with your right / left forearm supported on a table, keeping your elbow below shoulder height. Rest your hand over the edge, palm up.  Gently grip a hammer or a soup ladle.  Without moving your elbow, slowly turn your palm and hand upward to a "thumbs-up" position.  Hold this position for __________ seconds. Slowly return to the starting position. Repeat __________ times. Complete this exercise __________ times per day.  STRENGTH - Grip  Grasp a tennis ball, a dense sponge, or a large, rolled sock in your hand.  Squeeze as hard as you can, without increasing any pain.  Hold this position for __________ seconds. Release your grip slowly. Repeat __________ times. Complete this exercise __________ times per day.  STRENGTH - Elbow Extensors, Isometric  Stand or sit upright, on a firm surface. Place your right / left arm so that your palm faces your stomach, and it is at the height of your waist.  Place your opposite hand on the underside   of your forearm. Gently push up as your right / left arm resists. Push as hard as you can with both arms, without causing any pain or movement at your right / left elbow. Hold this stationary position for __________ seconds. Gradually release the tension in both arms. Allow your muscles to relax completely before repeating.   This information is not intended to replace advice given to you by your health care provider. Make sure you discuss any questions you have with your health care provider.   Document Released:  11/26/2005 Document Revised: 12/17/2014 Document Reviewed: 03/10/2009 Elsevier Interactive Patient Education 2016 [employer]  

## 2015-11-19 ENCOUNTER — Encounter: Payer: Self-pay | Admitting: Nurse Practitioner

## 2015-11-19 NOTE — Progress Notes (Signed)
Subjective:  Presents for c/o pain in the lateral area of both elbows, more on the left, for the past month. Difficulty lifting or turning her arms due to pain. No neck, shoulder or wrist pain. Cleans houses for a living. No specific history of injury.   Objective:   BP 118/82 mmHg  Ht 5\' 1"  (1.549 m)  Wt 104 lb 12.8 oz (47.537 kg)  BMI 19.81 kg/m2 NAD. Alert, oriented. Pain with palpation along the lateral epicondyle area bilat. Tenderness with rotation of the arms. Hand strength 5+ bilat. Normal ROM of the neck and left shoulder without tenderness.   Assessment: Lateral epicondylitis of both elbows  Plan: arm bands as directed. Ice applications. Aleve as directed. Call back in 2-3 weeks if no better, sooner if worse. Given written and verbal information on her condition.

## 2015-12-13 ENCOUNTER — Ambulatory Visit (INDEPENDENT_AMBULATORY_CARE_PROVIDER_SITE_OTHER): Payer: BLUE CROSS/BLUE SHIELD | Admitting: Family Medicine

## 2015-12-13 ENCOUNTER — Encounter: Payer: Self-pay | Admitting: Family Medicine

## 2015-12-13 VITALS — BP 108/64 | Temp 100.3°F | Wt 103.6 lb

## 2015-12-13 DIAGNOSIS — M7711 Lateral epicondylitis, right elbow: Secondary | ICD-10-CM | POA: Diagnosis not present

## 2015-12-13 DIAGNOSIS — M7712 Lateral epicondylitis, left elbow: Secondary | ICD-10-CM

## 2015-12-13 DIAGNOSIS — J019 Acute sinusitis, unspecified: Secondary | ICD-10-CM | POA: Diagnosis not present

## 2015-12-13 DIAGNOSIS — B9689 Other specified bacterial agents as the cause of diseases classified elsewhere: Secondary | ICD-10-CM

## 2015-12-13 MED ORDER — AMOXICILLIN 500 MG PO TABS: 500.0000 mg | ORAL_TABLET | Freq: Three times a day (TID) | ORAL | Status: DC

## 2015-12-13 MED ORDER — MELOXICAM 15 MG PO TABS: 15.0000 mg | ORAL_TABLET | Freq: Every day | ORAL | Status: DC

## 2015-12-13 NOTE — Patient Instructions (Signed)
Lateral Epicondylitis With Rehab Lateral epicondylitis involves inflammation and pain around the outer portion of the elbow. The pain is caused by inflammation of the tendons in the forearm that bring back (extend) the wrist. Lateral epicondylitis is also called tennis elbow, because it is very common in tennis players. However, it may occur in any individual who extends the wrist repetitively. If lateral epicondylitis is left untreated, it may become a chronic problem. SYMPTOMS   Pain, tenderness, and inflammation on the outer (lateral) side of the elbow.  Pain or weakness with gripping activities.  Pain that increases with wrist-twisting motions (playing tennis, using a screwdriver, opening a door or a jar).  Pain with lifting objects, including a coffee cup. CAUSES  Lateral epicondylitis is caused by inflammation of the tendons that extend the wrist. Causes of injury may include:  Repetitive stress and strain on the muscles and tendons that extend the wrist.  Sudden change in activity level or intensity.  Incorrect grip in racquet sports.  Incorrect grip size of racquet (often too large).  Incorrect hitting position or technique (usually backhand, leading with the elbow).  Using a racket that is too heavy. RISK INCREASES WITH:  Sports or occupations that require repetitive and/or strenuous forearm and wrist movements (tennis, squash, racquetball, carpentry).  Poor wrist and forearm strength and flexibility.  Failure to warm up properly before activity.  Resuming activity before healing, rehabilitation, and conditioning are complete. PREVENTION   Warm up and stretch properly before activity.  Maintain physical fitness:  Strength, flexibility, and endurance.  Cardiovascular fitness.  Wear and use properly fitted equipment.  Learn and use proper technique and have a coach correct improper technique.  Wear a tennis elbow (counterforce) brace. PROGNOSIS  The course of  this condition depends on the degree of the injury. If treated properly, acute cases (symptoms lasting less than 4 weeks) are often resolved in 2 to 6 weeks. Chronic (longer lasting cases) often resolve in 3 to 6 months but may require physical therapy. RELATED COMPLICATIONS   Frequently recurring symptoms, resulting in a chronic problem. Properly treating the problem the first time decreases frequency of recurrence.  Chronic inflammation, scarring tendon degeneration, and partial tendon tear, requiring surgery.  Delayed healing or resolution of symptoms. TREATMENT  Treatment first involves the use of ice and medicine to reduce pain and inflammation. Strengthening and stretching exercises may help reduce discomfort if performed regularly. These exercises may be performed at home if the condition is an acute injury. Chronic cases may require a referral to a physical therapist for evaluation and treatment. Your caregiver may advise a corticosteroid injection to help reduce inflammation. Rarely, surgery is needed. MEDICATION  If pain medicine is needed, nonsteroidal anti-inflammatory medicines (aspirin and ibuprofen), or other minor pain relievers (acetaminophen), are often advised.  Do not take pain medicine for 7 days before surgery.  Prescription pain relievers may be given, if your caregiver thinks they are needed. Use only as directed and only as much as you need.  Corticosteroid injections may be recommended. These injections should be reserved only for the most severe cases, because they can only be given a certain number of times. HEAT AND COLD  Cold treatment (icing) should be applied for 10 to 15 minutes every 2 to 3 hours for inflammation and pain, and immediately after activity that aggravates your symptoms. Use ice packs or an ice massage.  Heat treatment may be used before performing stretching and strengthening activities prescribed by your caregiver, physical therapist,   or  athletic trainer. Use a heat pack or a warm water soak. SEEK MEDICAL CARE IF: Symptoms get worse or do not improve in 2 weeks, despite treatment. EXERCISES  RANGE OF MOTION (ROM) AND STRETCHING EXERCISES - Epicondylitis, Lateral (Tennis Elbow) These exercises may help you when beginning to rehabilitate your injury. Your symptoms may go away with or without further involvement from your physician, physical therapist, or athletic trainer. While completing these exercises, remember:   Restoring tissue flexibility helps normal motion to return to the joints. This allows healthier, less painful movement and activity.  An effective stretch should be held for at least 30 seconds.  A stretch should never be painful. You should only feel a gentle lengthening or release in the stretched tissue. RANGE OF MOTION - Wrist Flexion, Active-Assisted  Extend your right / left elbow with your fingers pointing down.*  Gently pull the back of your hand towards you, until you feel a gentle stretch on the top of your forearm.  Hold this position for __________ seconds. Repeat __________ times. Complete this exercise __________ times per day.  *If directed by your physician, physical therapist or athletic trainer, complete this stretch with your elbow bent, rather than extended. RANGE OF MOTION - Wrist Extension, Active-Assisted  Extend your right / left elbow and turn your palm upwards.*  Gently pull your palm and fingertips back, so your wrist extends and your fingers point more toward the ground.  You should feel a gentle stretch on the inside of your forearm.  Hold this position for __________ seconds. Repeat __________ times. Complete this exercise __________ times per day. *If directed by your physician, physical therapist or athletic trainer, complete this stretch with your elbow bent, rather than extended. STRETCH - Wrist Flexion  Place the back of your right / left hand on a tabletop, leaving your  elbow slightly bent. Your fingers should point away from your body.  Gently press the back of your hand down onto the table by straightening your elbow. You should feel a stretch on the top of your forearm.  Hold this position for __________ seconds. Repeat __________ times. Complete this stretch __________ times per day.  STRETCH - Wrist Extension   Place your right / left fingertips on a tabletop, leaving your elbow slightly bent. Your fingers should point backwards.  Gently press your fingers and palm down onto the table by straightening your elbow. You should feel a stretch on the inside of your forearm.  Hold this position for __________ seconds. Repeat __________ times. Complete this stretch __________ times per day.  STRENGTHENING EXERCISES - Epicondylitis, Lateral (Tennis Elbow) These exercises may help you when beginning to rehabilitate your injury. They may resolve your symptoms with or without further involvement from your physician, physical therapist, or athletic trainer. While completing these exercises, remember:   Muscles can gain both the endurance and the strength needed for everyday activities through controlled exercises.  Complete these exercises as instructed by your physician, physical therapist or athletic trainer. Increase the resistance and repetitions only as guided.  You may experience muscle soreness or fatigue, but the pain or discomfort you are trying to eliminate should never worsen during these exercises. If this pain does get worse, stop and make sure you are following the directions exactly. If the pain is still present after adjustments, discontinue the exercise until you can discuss the trouble with your caregiver. STRENGTH - Wrist Flexors  Sit with your right / left forearm palm-up and fully supported   on a table or countertop. Your elbow should be resting below the height of your shoulder. Allow your wrist to extend over the edge of the  surface.  Loosely holding a __________ weight, or a piece of rubber exercise band or tubing, slowly curl your hand up toward your forearm.  Hold this position for __________ seconds. Slowly lower the wrist back to the starting position in a controlled manner. Repeat __________ times. Complete this exercise __________ times per day.  STRENGTH - Wrist Extensors  Sit with your right / left forearm palm-down and fully supported on a table or countertop. Your elbow should be resting below the height of your shoulder. Allow your wrist to extend over the edge of the surface.  Loosely holding a __________ weight, or a piece of rubber exercise band or tubing, slowly curl your hand up toward your forearm.  Hold this position for __________ seconds. Slowly lower the wrist back to the starting position in a controlled manner. Repeat __________ times. Complete this exercise __________ times per day.  STRENGTH - Ulnar Deviators  Stand with a ____________________ weight in your right / left hand, or sit while holding a rubber exercise band or tubing, with your healthy arm supported on a table or countertop.  Move your wrist, so that your pinkie travels toward your forearm and your thumb moves away from your forearm.  Hold this position for __________ seconds and then slowly lower the wrist back to the starting position. Repeat __________ times. Complete this exercise __________ times per day STRENGTH - Radial Deviators  Stand with a ____________________ weight in your right / left hand, or sit while holding a rubber exercise band or tubing, with your injured arm supported on a table or countertop.  Raise your hand upward in front of you or pull up on the rubber tubing.  Hold this position for __________ seconds and then slowly lower the wrist back to the starting position. Repeat __________ times. Complete this exercise __________ times per day. STRENGTH - Forearm Supinators   Sit with your right /  left forearm supported on a table, keeping your elbow below shoulder height. Rest your hand over the edge, palm down.  Gently grip a hammer or a soup ladle.  Without moving your elbow, slowly turn your palm and hand upward to a "thumbs-up" position.  Hold this position for __________ seconds. Slowly return to the starting position. Repeat __________ times. Complete this exercise __________ times per day.  STRENGTH - Forearm Pronators   Sit with your right / left forearm supported on a table, keeping your elbow below shoulder height. Rest your hand over the edge, palm up.  Gently grip a hammer or a soup ladle.  Without moving your elbow, slowly turn your palm and hand upward to a "thumbs-up" position.  Hold this position for __________ seconds. Slowly return to the starting position. Repeat __________ times. Complete this exercise __________ times per day.  STRENGTH - Grip  Grasp a tennis ball, a dense sponge, or a large, rolled sock in your hand.  Squeeze as hard as you can, without increasing any pain.  Hold this position for __________ seconds. Release your grip slowly. Repeat __________ times. Complete this exercise __________ times per day.  STRENGTH - Elbow Extensors, Isometric  Stand or sit upright, on a firm surface. Place your right / left arm so that your palm faces your stomach, and it is at the height of your waist.  Place your opposite hand on the underside   of your forearm. Gently push up as your right / left arm resists. Push as hard as you can with both arms, without causing any pain or movement at your right / left elbow. Hold this stationary position for __________ seconds. Gradually release the tension in both arms. Allow your muscles to relax completely before repeating.   This information is not intended to replace advice given to you by your health care provider. Make sure you discuss any questions you have with your health care provider.   Document Released:  11/26/2005 Document Revised: 12/17/2014 Document Reviewed: 03/10/2009 Elsevier Interactive Patient Education 2016 [employer]  

## 2015-12-13 NOTE — Progress Notes (Signed)
   Subjective:    Patient ID: SARINE LIVING, female    DOB: [birth year], 50 y.o.   MRN: [id number]  HPIpain in both elbows. Started about 6 months ago. Taking aleve. Does a lot of work with her hands has a lot of discomfort in her elbows patient does smoke  Ear pain, sore throat, runny nose started days ago.  Symptoms over the past several days progressive despite over-the-counter measures   Review of Systems See above    Objective:   Physical Exam  Mild sinus tenderness throat normal neck no masses lungs are clear no crackles bilateral epicondylitis of the elbows are noted.      Assessment & Plan:  Lateral epicondylitis left and right elbow. Prescription anti-inflammatory over the next few weeks along with cold for shots   compresses and wearing special band if this does not help follow-up.  Sinusitis antibiotics prescribed warning signs discuss

## 2016-03-26 ENCOUNTER — Ambulatory Visit (INDEPENDENT_AMBULATORY_CARE_PROVIDER_SITE_OTHER): Payer: BLUE CROSS/BLUE SHIELD | Admitting: Nurse Practitioner

## 2016-03-26 ENCOUNTER — Encounter: Payer: Self-pay | Admitting: Nurse Practitioner

## 2016-03-26 VITALS — BP 102/70 | Temp 98.1°F | Ht 61.0 in | Wt 104.1 lb

## 2016-03-26 DIAGNOSIS — J329 Chronic sinusitis, unspecified: Secondary | ICD-10-CM

## 2016-03-26 DIAGNOSIS — J310 Chronic rhinitis: Secondary | ICD-10-CM

## 2016-03-26 MED ORDER — AMOXICILLIN-POT CLAVULANATE 875-125 MG PO TABS
1.0000 | ORAL_TABLET | Freq: Two times a day (BID) | ORAL | Status: DC
Start: 2016-03-26 — End: 2016-04-03

## 2016-03-26 MED ORDER — HYDROCODONE-HOMATROPINE 5-1.5 MG/5ML PO SYRP: 5.0000 mL | ORAL_SOLUTION | ORAL | Status: DC | PRN

## 2016-03-26 NOTE — Progress Notes (Signed)
Subjective:  Presents for complaints of cough and congestion for the past 3 days. No fever. Sore throat. Off-and-on frontal area headache. Clear runny nose. No wheezing. Some ear pressure. Continues to smoke a little less than half pack per day.  Objective:   BP 102/70 mmHg  Temp(Src) 98.1 F (36.7 C) (Oral)  Ht 5\' 1"  (1.549 m)  Wt 104 lb 2 oz (47.231 kg)  BMI 19.68 kg/m2 NAD. Alert, oriented. TMs clear effusion, no erythema. Pharynx injected with slightly green PND noted. Neck supple with mild soft anterior adenopathy. Lungs clear. Heart regular rate rhythm.  Assessment: Rhinosinusitis  Plan:  Meds ordered this encounter  Medications  . amoxicillin-clavulanate (AUGMENTIN) 875-125 MG tablet    Sig: Take 1 tablet by mouth 2 (two) times daily.    Dispense:  20 tablet    Refill:  0    Order Specific Question:  Supervising Provider    Answer:  Mikey Kirschner [[id number]]  . HYDROcodone-homatropine (HYCODAN) 5-1.5 MG/5ML syrup    Sig: Take 5 mLs by mouth every 4 (four) hours as needed.    Dispense:  120 mL    Refill:  0    Order Specific Question:  Supervising Provider    Answer:  Mikey Kirschner [[id number]]   OTC meds as directed for daytime use. Drowsiness precautions with Hycodan syrup. Call back if symptoms worsen or persist.

## 2016-04-03 ENCOUNTER — Telehealth: Payer: Self-pay | Admitting: Nurse Practitioner

## 2016-04-03 ENCOUNTER — Other Ambulatory Visit: Payer: Self-pay | Admitting: Nurse Practitioner

## 2016-04-03 MED ORDER — DOXYCYCLINE HYCLATE 100 MG PO TABS: 100.0000 mg | ORAL_TABLET | Freq: Two times a day (BID) | ORAL | Status: DC

## 2016-04-03 NOTE — Telephone Encounter (Signed)
Will send in Rx for Levaquin. Call back next week if no better, sooner if worse.

## 2016-04-03 NOTE — Telephone Encounter (Signed)
amoxicillin-clavulanate (AUGMENTIN) 875-125 MG tablet And hycodan cough syrup  Pt was issued these meds after being seen last week 4/17 She still feels bad, cough, nose running, head all congested   [facility]

## 2016-04-03 NOTE — Telephone Encounter (Signed)
Notified patient will send in Rx for antibiotic. Call back next week if no better, sooner if worse. Patient verbalized understanding.

## 2016-04-03 NOTE — Telephone Encounter (Signed)
[facility] to get more info 

## 2016-04-03 NOTE — Telephone Encounter (Signed)
Patient states that she is having a cough, head congestion (yellow colored). No fever, sob, wheezing or sore throat.

## 2016-07-09 DIAGNOSIS — L308 Other specified dermatitis: Secondary | ICD-10-CM | POA: Diagnosis not present

## 2016-07-09 DIAGNOSIS — L821 Other seborrheic keratosis: Secondary | ICD-10-CM | POA: Diagnosis not present

## 2016-07-09 DIAGNOSIS — L853 Xerosis cutis: Secondary | ICD-10-CM | POA: Diagnosis not present

## 2017-02-05 ENCOUNTER — Ambulatory Visit (INDEPENDENT_AMBULATORY_CARE_PROVIDER_SITE_OTHER): Payer: BLUE CROSS/BLUE SHIELD

## 2017-02-05 ENCOUNTER — Encounter: Payer: Self-pay | Admitting: Orthopedic Surgery

## 2017-02-05 ENCOUNTER — Ambulatory Visit (INDEPENDENT_AMBULATORY_CARE_PROVIDER_SITE_OTHER): Payer: BLUE CROSS/BLUE SHIELD | Admitting: Orthopedic Surgery

## 2017-02-05 VITALS — BP 120/79 | HR 87 | Wt 105.0 lb

## 2017-02-05 DIAGNOSIS — M25512 Pain in left shoulder: Secondary | ICD-10-CM

## 2017-02-05 DIAGNOSIS — M75102 Unspecified rotator cuff tear or rupture of left shoulder, not specified as traumatic: Secondary | ICD-10-CM | POA: Diagnosis not present

## 2017-02-05 MED ORDER — PREDNISONE 10 MG PO TABS: 10.0000 mg | ORAL_TABLET | Freq: Every day | ORAL | 0 refills | Status: DC

## 2017-02-05 NOTE — Progress Notes (Signed)
Patient ID: Carla Wise, female   DOB: 13-Sep-1966, 51 y.o.   MRN: [id number]  Chief complaint pain left shoulder  HPI Carla Wise is a 51 y.o. female.  35 year 51 year old female presents with atraumatic onset of pain in her left shoulder and left arm. She perceives pain more in her arm on the lateral side near the insertion of the deltoid associated loss of range of motion of the shoulder painful abduction and forward elevation of the shoulder with no history of trauma. She also notices some stiffness in the left shoulder    Review of Systems Review of Systems Normal neuro  Denies fever   Past Medical History:  Diagnosis Date  . PONV (postoperative nausea and vomiting)     Past Surgical History:  Procedure Laterality Date  . FOOT SURGERY  2003  . KNEE ARTHROSCOPY W/ MENISCECTOMY     left knee      Family History  Problem Relation Age of Onset  . Heart disease    . Cancer     The patient was quizzed about their family history and reported no history of bleeding problems or anesthesia problems in their family  Social History Social History  Substance Use Topics  . Smoking status: Current Every Day Smoker    Packs/day: 0.50    Types: Cigarettes  . Smokeless tobacco: Not on file  . Alcohol use No    Allergies  Allergen Reactions  . Azithromycin     Patient gets worst whenever she takes a Z-Pak  . Levaquin [Levofloxacin In D5w]     Pt states she gets worst.     Current Outpatient Prescriptions  Medication Sig Dispense Refill  . doxycycline (VIBRA-TABS) 100 MG tablet Take 1 tablet (100 mg total) by mouth 2 (two) times daily. 20 tablet 0  . HYDROcodone-homatropine (HYCODAN) 5-1.5 MG/5ML syrup Take 5 mLs by mouth every 4 (four) hours as needed. 120 mL 0  . meloxicam (MOBIC) 15 MG tablet Take 1 tablet (15 mg total) by mouth daily. 30 tablet 2   No current facility-administered medications for this visit.        Physical Exam There were no vitals  taken for this visit. Physical Exam The patient is well developed well nourished and well groomed.  Orientation to person place and time is normal  Mood is pleasant.  Ambulatory status Is normal Cervical spine exam is as follows nontender cervical spine with normal range of motion negative Spurling sign  Ortho Exam Left shoulder  Examination: Inspection reveals tenderness around the peri-acromial region. The patient has decreased range of motion in external rotation 30 internal rotation L5 with 150 of FLEXION   and grade 5/ 5 motor function of the rotator cuff. Stability in abduction external rotation is normal.   Neurovascular examination is intact and the lymph nodes in the axilla and supraclavicular regions are normal   The opposite shoulder right exhibits normal range of motion stability and strength neurovascular exam is intact, lymph nodes are negative and there is no swelling or tenderness   Data Reviewed IMAGING: AP/LAT left Shoulder: I have read and interpret the x-ray as follows:   normal   Assessment    Left SHOULDER  ROTATOR CUFF SYNDROME SHOULDER PAIN     Plan    [facility] INJECTION Follow-up as needed  Procedure note the subacromial injection shoulder left   Verbal consent was obtained to inject the  Left   Shoulder  Timeout was completed  to confirm the injection site is a subacromial space of the  left  shoulder  Medication used Depo-Medrol 40 mg and lidocaine 1% 3 cc  Anesthesia was provided by ethyl chloride  The injection was performed in the left  posterior subacromial space. After pinning the skin with alcohol and anesthetized the skin with ethyl chloride the subacromial space was injected using a 20-gauge needle. There were no complications  Sterile dressing was applied.          [name], MD 02/05/2017 10:54 AM

## 2017-02-05 NOTE — Patient Instructions (Addendum)

## 2017-02-07 DIAGNOSIS — D131 Benign neoplasm of stomach: Secondary | ICD-10-CM

## 2017-02-07 HISTORY — DX: Benign neoplasm of stomach: D13.1

## 2017-02-15 ENCOUNTER — Encounter: Payer: Self-pay | Admitting: Gastroenterology

## 2017-02-15 ENCOUNTER — Ambulatory Visit (INDEPENDENT_AMBULATORY_CARE_PROVIDER_SITE_OTHER): Payer: BLUE CROSS/BLUE SHIELD | Admitting: Gastroenterology

## 2017-02-15 VITALS — BP 125/71 | HR 96 | Temp 98.0°F | Ht 61.0 in | Wt 106.0 lb

## 2017-02-15 DIAGNOSIS — R1310 Dysphagia, unspecified: Secondary | ICD-10-CM | POA: Insufficient documentation

## 2017-02-15 DIAGNOSIS — Z1211 Encounter for screening for malignant neoplasm of colon: Secondary | ICD-10-CM | POA: Diagnosis not present

## 2017-02-15 DIAGNOSIS — Z13220 Encounter for screening for lipoid disorders: Secondary | ICD-10-CM | POA: Insufficient documentation

## 2017-02-15 DIAGNOSIS — K5900 Constipation, unspecified: Secondary | ICD-10-CM | POA: Diagnosis not present

## 2017-02-15 NOTE — Patient Instructions (Signed)
For problems swallowing: we have scheduled an upper endoscopy with dilation with Dr. Oneida Alar in the near future. We will do a routine screening colonoscopy at that same time.  For constipation: try Linzess 290 mcg, one capsule each morning 30 minutes before breakfast. This can cause some loose stool to begin with but should get better. If not, call me. Also, let me know how you do with it so I can send in a prescription or adjust the dose.  We will see you in 3 months!

## 2017-02-15 NOTE — Progress Notes (Signed)
Primary Care Physician:  Sallee Lange, MD Primary Gastroenterologist:  Dr. Oneida Alar   Chief Complaint  Patient presents with  . Dysphagia    HPI:   Carla Wise is a 51 y.o. female presenting today as a self-referral secondary to dysphagia.    Feels a globus sensation in throat. Choking when eating. Has been present for 4 months and worse over past month. No pain with eating. Scared to eat certain foods. No indigestion/heartburn. No N/V. No abdominal pain. Chronic constipation, no rectal bleeding. Will go sometimes 2 weeks and not go. Will do the magnesium citrate without much relief. Feels constantly full.   Past Medical History:  Diagnosis Date  . Constipation   . PONV (postoperative nausea and vomiting)     Past Surgical History:  Procedure Laterality Date  . FOOT SURGERY  2003  . KNEE ARTHROSCOPY W/ MENISCECTOMY     left knee    Current Outpatient Prescriptions  Medication Sig Dispense Refill  . polyethylene glycol-electrolytes (TRILYTE) 420 g solution Take 4,000 mLs by mouth as directed. 4000 mL 0   No current facility-administered medications for this visit.     Allergies as of 02/15/2017 - Review Complete 02/15/2017  Allergen Reaction Noted  . Azithromycin  11/23/2013  . Levaquin [levofloxacin in d5w]  02/04/2015    Family History  Problem Relation Age of Onset  . Heart disease    . Cancer    . Colon cancer Neg Hx     Social History   Social History  . Marital status: Married    Spouse name: N/A  . Number of children: N/A  . Years of education: 12th grade   Occupational History  . [employer]     cleaning service    Social History Main Topics  . Smoking status: Current Every Day Smoker    Packs/day: 0.50    Types: Cigarettes  . Smokeless tobacco: Never Used  . Alcohol use No  . Drug use: No  . Sexual activity: Not on file   Other Topics Concern  . Not on file   Social History Narrative  . No narrative on file    Review of  Systems: Gen: Denies any fever, chills, fatigue, weight loss, lack of appetite.  CV: Denies chest pain, heart palpitations, peripheral edema, syncope.  Resp: Denies shortness of breath at rest or with exertion. Denies wheezing or cough.  GI: see HPI  GU : Denies urinary burning, urinary frequency, urinary hesitancy MS: Denies joint pain, muscle weakness, cramps, or limitation of movement.  Derm: Denies rash, itching, dry skin Psych: Denies depression, anxiety, memory loss, and confusion Heme: Denies bruising, bleeding, and enlarged lymph nodes.  Physical Exam: BP 125/71   Pulse 96   Temp 98 F (36.7 C) (Oral)   Ht 5\' 1"  (1.549 m)   Wt 106 lb (48.1 kg)   BMI 20.03 kg/m  General:   Alert and oriented. Pleasant and cooperative. Well-nourished and well-developed.  Head:  Normocephalic and atraumatic. Eyes:  Without icterus, sclera clear and conjunctiva pink.  Ears:  Normal auditory acuity. Nose:  No deformity, discharge,  or lesions. Mouth:  No deformity or lesions, oral mucosa pink.  Lungs:  Clear to auscultation bilaterally. No wheezes, rales, or rhonchi. No distress.  Heart:  S1, S2 present without murmurs appreciated.  Abdomen:  +BS, soft, non-tender and non-distended. No HSM noted. No guarding or rebound. No masses appreciated.  Rectal:  Deferred  Msk:  Symmetrical without gross  deformities. Normal posture. Extremities:  Without  edema. Neurologic:  Alert and  oriented x4;  grossly normal neurologically. Psych:  Alert and cooperative. Normal mood and affect.

## 2017-02-18 ENCOUNTER — Other Ambulatory Visit: Payer: Self-pay

## 2017-02-18 ENCOUNTER — Telehealth: Payer: Self-pay

## 2017-02-18 DIAGNOSIS — Z1211 Encounter for screening for malignant neoplasm of colon: Secondary | ICD-10-CM

## 2017-02-18 DIAGNOSIS — R1310 Dysphagia, unspecified: Secondary | ICD-10-CM

## 2017-02-18 MED ORDER — PEG 3350-KCL-NA BICARB-NACL 420 G PO SOLR
4000.0000 mL | ORAL | 0 refills | Status: DC
Start: 2017-02-18 — End: 2017-02-27

## 2017-02-18 NOTE — Assessment & Plan Note (Signed)
No prior screening colonoscopy. No alarm symptoms. Desires to have done at time of EGD.   Proceed with colonoscopy with Dr. Oneida Alar in the near future. The risks, benefits, and alternatives have been discussed in detail with the patient. They state understanding and desire to proceed.  Phenergan 25 mg IV on call

## 2017-02-18 NOTE — Assessment & Plan Note (Addendum)
Chronic, no alarm symptoms. Start Linzess 290 mcg daily. Samples provided. Patient to call with update. Return in 3 months.

## 2017-02-18 NOTE — Telephone Encounter (Signed)
Tried to call with no answer  

## 2017-02-18 NOTE — Assessment & Plan Note (Signed)
51 year old female with dysphagia present for 4 months and worsening over the past month, no odynophagia. Denies typical GERD symptoms. No prior EGD. Needs evaluation via EGD.  Proceed with upper endoscopy/ dilation in the near future with Dr. Oneida Alar. The risks, benefits, and alternatives have been discussed in detail with patient. They have stated understanding and desire to proceed.  Phenergan 25 mg IV on call due to reports of nausea with anesthesia

## 2017-02-19 NOTE — Telephone Encounter (Signed)
Pt is set up for TCS on 03/01/17 @ 2:00 pm

## 2017-02-19 NOTE — Progress Notes (Signed)
cc'ed to pcp °

## 2017-02-26 ENCOUNTER — Telehealth: Payer: Self-pay | Admitting: Gastroenterology

## 2017-02-26 NOTE — Telephone Encounter (Signed)
Pt is scheduled for a colonoscopy and EGD with SF on 3/23. She wants to cancel the colonoscopy because her insurance will not cover it until she turns 28. She wants to do the EGD. Please call her back at [phone]

## 2017-02-26 NOTE — Telephone Encounter (Signed)
LMOM to call back

## 2017-02-27 NOTE — Telephone Encounter (Signed)
REVIEWED-NO ADDITIONAL RECOMMENDATIONS. 

## 2017-02-27 NOTE — Telephone Encounter (Signed)
Talked with and her insurance told her that they would not cover the TCS. She would like to still have the EGD on Friday.

## 2017-03-01 ENCOUNTER — Encounter (HOSPITAL_COMMUNITY)
Admission: RE | Disposition: A | Discharge status: Home / Self Care | Payer: Self-pay | Source: Ambulatory Visit | Attending: Gastroenterology

## 2017-03-01 ENCOUNTER — Encounter (HOSPITAL_COMMUNITY): Payer: Self-pay | Admitting: *Deleted

## 2017-03-01 ENCOUNTER — Ambulatory Visit (HOSPITAL_COMMUNITY)
Admission: RE | Admit: 2017-03-01 | Discharge: 2017-03-01 | Disposition: A | Discharge status: Home / Self Care | Payer: BLUE CROSS/BLUE SHIELD | Source: Ambulatory Visit | Attending: Gastroenterology | Admitting: Gastroenterology

## 2017-03-01 DIAGNOSIS — F17210 Nicotine dependence, cigarettes, uncomplicated: Secondary | ICD-10-CM | POA: Insufficient documentation

## 2017-03-01 DIAGNOSIS — K319 Disease of stomach and duodenum, unspecified: Secondary | ICD-10-CM

## 2017-03-01 DIAGNOSIS — D131 Benign neoplasm of stomach: Secondary | ICD-10-CM | POA: Diagnosis not present

## 2017-03-01 DIAGNOSIS — K222 Esophageal obstruction: Secondary | ICD-10-CM | POA: Diagnosis not present

## 2017-03-01 DIAGNOSIS — R1013 Epigastric pain: Secondary | ICD-10-CM | POA: Diagnosis not present

## 2017-03-01 DIAGNOSIS — K5710 Diverticulosis of small intestine without perforation or abscess without bleeding: Secondary | ICD-10-CM | POA: Insufficient documentation

## 2017-03-01 DIAGNOSIS — R1310 Dysphagia, unspecified: Secondary | ICD-10-CM | POA: Diagnosis not present

## 2017-03-01 DIAGNOSIS — Z1211 Encounter for screening for malignant neoplasm of colon: Secondary | ICD-10-CM

## 2017-03-01 DIAGNOSIS — K2970 Gastritis, unspecified, without bleeding: Secondary | ICD-10-CM | POA: Diagnosis not present

## 2017-03-01 HISTORY — PX: SAVORY DILATION: SHX5439

## 2017-03-01 HISTORY — PX: ESOPHAGOGASTRODUODENOSCOPY: SHX5428

## 2017-03-01 SURGERY — EGD (ESOPHAGOGASTRODUODENOSCOPY)
Anesthesia: Moderate Sedation

## 2017-03-01 MED ORDER — SODIUM CHLORIDE 0.9% FLUSH
INTRAVENOUS | Status: AC
  Filled 2017-03-01: qty 10

## 2017-03-01 MED ORDER — MIDAZOLAM HCL 5 MG/5ML IJ SOLN
INTRAMUSCULAR | Status: AC
  Filled 2017-03-01: qty 10

## 2017-03-01 MED ORDER — OMEPRAZOLE 20 MG PO CPDR: DELAYED_RELEASE_CAPSULE | ORAL | 3 refills | Status: DC

## 2017-03-01 MED ORDER — MINERAL OIL PO OIL
TOPICAL_OIL | ORAL | Status: AC
  Filled 2017-03-01: qty 30

## 2017-03-01 MED ORDER — LIDOCAINE VISCOUS 2 % MT SOLN
OROMUCOSAL | Status: AC
  Filled 2017-03-01: qty 15

## 2017-03-01 MED ORDER — SODIUM CHLORIDE 0.9 % IV SOLN
INTRAVENOUS | Status: DC
  Administered 2017-03-01: 13:00:00 via INTRAVENOUS

## 2017-03-01 MED ORDER — MEPERIDINE HCL 100 MG/ML IJ SOLN
INTRAMUSCULAR | Status: DC | PRN
  Administered 2017-03-01 (×2): 25 mg via INTRAVENOUS

## 2017-03-01 MED ORDER — PROMETHAZINE HCL 25 MG/ML IJ SOLN
INTRAMUSCULAR | Status: AC
  Filled 2017-03-01: qty 1

## 2017-03-01 MED ORDER — MIDAZOLAM HCL 5 MG/5ML IJ SOLN
INTRAMUSCULAR | Status: DC | PRN
  Administered 2017-03-01: 1 mg via INTRAVENOUS
  Administered 2017-03-01: 2 mg via INTRAVENOUS

## 2017-03-01 MED ORDER — PROMETHAZINE HCL 25 MG/ML IJ SOLN
25.0000 mg | Freq: Once | INTRAMUSCULAR | Status: AC
  Administered 2017-03-01: 25 mg via INTRAVENOUS

## 2017-03-01 MED ORDER — MEPERIDINE HCL 100 MG/ML IJ SOLN
INTRAMUSCULAR | Status: AC
  Filled 2017-03-01: qty 2

## 2017-03-01 MED ORDER — LIDOCAINE VISCOUS 2 % MT SOLN
OROMUCOSAL | Status: DC | PRN
  Administered 2017-03-01: 1 via OROMUCOSAL

## 2017-03-01 NOTE — H&P (Signed)
  Primary Care Physician:  Sallee Lange, MD Primary Gastroenterologist:  Dr. Oneida Alar  Pre-Procedure History & Physical: HPI:  Carla Wise is a 51 y.o. female here for [place].  Past Medical History:  Diagnosis Date  . Constipation   . PONV (postoperative nausea and vomiting)     Past Surgical History:  Procedure Laterality Date  . FOOT SURGERY  2003  . KNEE ARTHROSCOPY W/ MENISCECTOMY     left knee    Prior to Admission medications   Not on File    Allergies as of 02/18/2017 - Review Complete 02/15/2017  Allergen Reaction Noted  . Azithromycin  11/23/2013  . Levaquin [levofloxacin in d5w]  02/04/2015    Family History  Problem Relation Age of Onset  . Heart disease    . Cancer    . Colon cancer Neg Hx     Social History   Social History  . Marital status: Married    Spouse name: N/A  . Number of children: N/A  . Years of education: 12th grade   Occupational History  . [employer]     cleaning service    Social History Main Topics  . Smoking status: Current Every Day Smoker    Packs/day: 0.50    Types: Cigarettes  . Smokeless tobacco: Never Used  . Alcohol use No  . Drug use: No  . Sexual activity: Not on file   Other Topics Concern  . Not on file   Social History Narrative  . No narrative on file    Review of Systems: See HPI, otherwise negative ROS   Physical Exam: BP 117/76   Pulse 71   Temp 97.7 F (36.5 C) (Oral)   Resp 17   Ht 5\' 1"  (1.549 m)   Wt 106 lb (48.1 kg)   SpO2 100%   BMI 20.03 kg/m  General:   Alert,  pleasant and cooperative in NAD Head:  Normocephalic and atraumatic. Neck:  Supple; Lungs:  Clear throughout to auscultation.    Heart:  Regular rate and rhythm. Abdomen:  Soft, nontender and nondistended. Normal bowel sounds, without guarding, and without rebound.   Neurologic:  Alert and  oriented x4;  grossly normal neurologically.  Impression/Plan:     DYSPHAGIA  PLAN:  EGD/DIL TODAY. DISCUSSED PROCEDURE,  BENEFITS, & RISKS: < 1% chance of medication reaction, bleeding, OR perforation.

## 2017-03-01 NOTE — Discharge Instructions (Signed)
I STRETCHED your esophagus. You have a stricture near the base of your esophagus MOST LIKELY DUE TO ACID REFLUX.  You have mild gastritis & ONE GASTRIC POLYP. YOU HAVE A DIVERTICULUM IN YOUR SMALL BOWEL. I biopsied your stomach AND THE POLYP.   FOLLOW A LOW FAT DIET. MEATS SHOULD BE BAKED, BROILED, OR BOILED.   AVOID REFLUX TRIGGERS. SEE INFO BELOW.  YOUR BIOPSY RESULTS WILL BE AVAILABLE IN MY CHART AFTER MAR 27 AND MY OFFICE WILL CONTACT YOU IN 10-14 DAYS WITH YOUR RESULTS.   FOLLOW UP IN 3 MOS.  UPPER ENDOSCOPY AFTER CARE Read the instructions outlined below and refer to this sheet in the next week. These discharge instructions provide you with general information on caring for yourself after you leave the hospital. While your treatment has been planned according to the most current medical practices available, unavoidable complications occasionally occur. If you have any problems or questions after discharge, call DR. Anay Walter, [phone].  ACTIVITY  You may resume your regular activity, but move at a slower pace for the next 24 hours.   Take frequent rest periods for the next 24 hours.   Walking will help get rid of the air and reduce the bloated feeling in your belly (abdomen).   No driving for 24 hours (because of the medicine (anesthesia) used during the test).   You may shower.   Do not sign any important legal documents or operate any machinery for 24 hours (because of the anesthesia used during the test).    NUTRITION  Drink plenty of fluids.   You may resume your normal diet as instructed by your doctor.   Begin with a light meal and progress to your normal diet. Heavy or fried foods are harder to digest and may make you feel sick to your stomach (nauseated).   Avoid alcoholic beverages for 24 hours or as instructed.    MEDICATIONS  You may resume your normal medications.   WHAT YOU CAN EXPECT TODAY  Some feelings of bloating in the abdomen.   Passage of  more gas than usual.    IF YOU HAD A BIOPSY TAKEN DURING THE UPPER ENDOSCOPY:  Eat a soft diet IF YOU HAVE NAUSEA, BLOATING, ABDOMINAL PAIN, OR VOMITING.    FINDING OUT THE RESULTS OF YOUR TEST Not all test results are available during your visit. DR. Oneida Alar WILL CALL YOU WITHIN 14 DAYS OF YOUR PROCEDUE WITH YOUR RESULTS. Do not assume everything is normal if you have not heard from DR. Chevy Virgo, CALL HER OFFICE AT [phone].  SEEK IMMEDIATE MEDICAL ATTENTION AND CALL THE OFFICE: [phone] IF:  You have more than a spotting of blood in your stool.   Your belly is swollen (abdominal distention).   You are nauseated or vomiting.   You have a temperature over 101F.   You have abdominal pain or discomfort that is severe or gets worse throughout the day.  Gastritis  Gastritis is an inflammation (the body's way of reacting to injury and/or infection) of the stomach. It is often caused by viral or bacterial (germ) infections. It can also be caused BY ASPIRIN, BC/GOODY POWDER'S, (IBUPROFEN) MOTRIN, OR ALEVE (NAPROXEN), chemicals (including alcohol), SPICY FOODS, and medications. This illness may be associated with generalized malaise (feeling tired, not well), UPPER ABDOMINAL STOMACH cramps, and fever. One common bacterial cause of gastritis is an organism known as H. Pylori. This can be treated with antibiotics.   ESOPHAGEAL STRICTURE  Esophageal strictures can be caused by stomach  acid backing up into the tube that carries food from the mouth down to the stomach (lower esophagus).  TREATMENT There are a number of medicines used to treat reflux/stricture, including: Antacids.  Proton-pump inhibitors: OMEPRAZOLE ZANTAC OR PEPCID   HOME CARE INSTRUCTIONS Eat 2-3 hours before going to bed.  Do not eat just a few very large meals. Instead, eat 4 TO 6 smaller meals throughout the day.  Try to identify foods and beverages that make your symptoms worse, and avoid these.  Common triggers  such as fatty or fried foods, spicy food, tomato sauce, carbonated beverages, alcohol, chocolate, mint, garlic, onion, caffeine and nicotine may make heartburn worse.  Avoid tight clothing.  Do not exercise right after eating.

## 2017-03-01 NOTE — Op Note (Signed)
[facility] Patient Name: Carla Wise Procedure Date: 03/01/2017 1:57 PM MRN: [id number] Date of Birth: [birth year] Attending MD: Barney Drain , MD CSN: [id number] Age: 51 Admit Type: Outpatient Procedure:                Upper GI endoscopy [facility] COLD FORCEPS BIOPSY Indications:              Dysphagia Providers:                Barney Drain, MD, Janeece Riggers, RN, Charlyne Petrin                            RN, RN Referring MD:             Elayne Snare Luking Medicines:                Meperidine 50 mg IV, Midazolam 3 mg IV,                            Promethazine 86.7 mg IV Complications:            No immediate complications. Estimated Blood Loss:     Estimated blood loss was minimal. Procedure:                Pre-Anesthesia Assessment:                           - Prior to the procedure, a History and Physical                            was performed, and patient medications and                            allergies were reviewed. The patient's tolerance of                            previous anesthesia was also reviewed. The risks                            and benefits of the procedure and the sedation                            options and risks were discussed with the patient.                            All questions were answered, and informed consent                            was obtained. Prior Anticoagulants: The patient has                            taken no previous anticoagulant or antiplatelet                            agents. ASA Grade Assessment: I - A normal, healthy  patient. After reviewing the risks and benefits,                            the patient was deemed in satisfactory condition to                            undergo the procedure. After obtaining informed                            consent, the endoscope was passed under direct                            vision. Throughout the procedure, the patient's   blood pressure, pulse, and oxygen saturations were                            monitored continuously. The EG-[id number] ([id number])                            scope was introduced through the mouth, and                            advanced to the second part of duodenum. The upper                            GI endoscopy was accomplished without difficulty.                            The patient tolerated the procedure well. Scope In: 2:06:56 PM Scope Out: 2:17:54 PM Total Procedure Duration: 0 hours 10 minutes 58 seconds  Findings:      One moderate (circumferential scarring or stenosis; an endoscope may       pass) benign-appearing, intrinsic stenosis was found. This measured 1.3       cm (inner diameter) and was traversed. A guidewire was placed and the       scope was withdrawn. Dilation was performed with a Savary dilator with       mild resistance at 12.8 mm and 14 mm and moderate resistance at 15 mm,       16 mm and 17 mm. Estimated blood loss was minimal.      A single 5 mm sessile polyp was found in the gastric body. This was       biopsied with a cold forceps for histology.      Patchy mild inflammation characterized by congestion (edema) and       erythema was found in the gastric antrum. Biopsies were taken with a       cold forceps for Helicobacter pylori testing.      A 6 mm diverticulum was found in the second portion of the duodenum.      The duodenal bulb was normal. Impression:               - Benign-appearing esophageal PEPTIC STRICTURE.                           - A single gastric polyp.                           -  Gastritis. Biopsied.                           - Duodenal diverticulum. Moderate Sedation:      Moderate (conscious) sedation was administered by the endoscopy nurse       and supervised by the endoscopist. The following parameters were       monitored: oxygen saturation, heart rate, blood pressure, and response       to care. Total physician intraservice time was  20 minutes. Recommendation:           - Await pathology results.                           - High fiber diet and low fat diet.                           - Patient medication history reviewed. Patient is                            appropriately not taking any medications.                           ADD Prilosec (omeprazole) 20 mg PO daily 30 MINS                            PRIOR TO FIRST MEAL.                           - Patient has a contact number available for                            emergencies. The signs and symptoms of potential                            delayed complications were discussed with the                            patient. Return to normal activities tomorrow.                            Written discharge instructions were provided to the                            patient. Procedure Code(s):        --- Professional ---                           [phone], Esophagogastroduodenoscopy, flexible,                            transoral; with insertion of guide wire followed by                            passage of dilator(s) through esophagus over guide  wire                           [id number], Esophagogastroduodenoscopy, flexible,                            transoral; with biopsy, single or multiple                           99152, Moderate sedation services provided by the                            same physician or other qualified health care                            professional performing the diagnostic or                            therapeutic service that the sedation supports,                            requiring the presence of an independent trained                            observer to assist in the monitoring of the                            patient's level of consciousness and physiological                            status; initial 15 minutes of intraservice time,                            patient age 16 years or older Diagnosis Code(s):         --- Professional ---                           K22.2, Esophageal obstruction                           K31.7, Polyp of stomach and duodenum                           K29.70, Gastritis, unspecified, without bleeding                           R13.10, Dysphagia, unspecified                           K57.10, Diverticulosis of small intestine without                            perforation or abscess without bleeding CPT copyright 2016 American Medical Association. All rights reserved. The codes documented in this report are preliminary and upon coder review may  be revised to meet current compliance requirements. Barney Drain, MD Barney Drain, MD 03/01/2017 2:31:54  PM This report has been signed electronically. Number of Addenda: 0

## 2017-03-04 DIAGNOSIS — R1013 Epigastric pain: Secondary | ICD-10-CM

## 2017-03-04 DIAGNOSIS — K319 Disease of stomach and duodenum, unspecified: Secondary | ICD-10-CM

## 2017-03-05 ENCOUNTER — Encounter: Payer: Self-pay | Admitting: Orthopedic Surgery

## 2017-03-05 ENCOUNTER — Ambulatory Visit (INDEPENDENT_AMBULATORY_CARE_PROVIDER_SITE_OTHER): Payer: BLUE CROSS/BLUE SHIELD | Admitting: Orthopedic Surgery

## 2017-03-05 DIAGNOSIS — M7502 Adhesive capsulitis of left shoulder: Secondary | ICD-10-CM

## 2017-03-05 DIAGNOSIS — M75102 Unspecified rotator cuff tear or rupture of left shoulder, not specified as traumatic: Secondary | ICD-10-CM

## 2017-03-05 MED ORDER — PREDNISONE 10 MG PO TABS: 10.0000 mg | ORAL_TABLET | Freq: Every day | ORAL | 0 refills | Status: DC

## 2017-03-05 NOTE — Patient Instructions (Addendum)
WE WILL SCHEDULE MRI FOR YOU  Adhesive Capsulitis Adhesive capsulitis is inflammation of the tendons and ligaments that surround the shoulder joint (shoulder capsule). This condition causes the shoulder to become stiff and painful to move. Adhesive capsulitis is also called frozen shoulder. What are the causes? This condition may be caused by:  An injury to the shoulder joint.  Straining the shoulder.  Not moving the shoulder for a period of time. This can happen if your arm was injured or in a sling.  Long-standing health problems, such as:  Diabetes.  Thyroid problems.  Heart disease.  Stroke.  Rheumatoid arthritis.  Lung disease. In some cases, the cause may not be known. What increases the risk? This condition is more likely to develop in:  Women.  People who are older than 51 years of age. What are the signs or symptoms? Symptoms of this condition include:  Pain in the shoulder when moving the arm. There may also be pain when parts of the shoulder are touched. The pain is worse at night or when at rest.  Soreness or aching in the shoulder.  Inability to move the shoulder normally.  Muscle spasms. How is this diagnosed? This condition is diagnosed with a physical exam and imaging tests, such as an X-ray or MRI. How is this treated? This condition may be treated with:  Treatment of the underlying cause or condition.  Physical therapy. This involves performing exercises to get the shoulder moving again.  Medicine. Medicine may be given to relieve pain, inflammation, or muscle spasms.  Steroid injections into the shoulder joint.  Shoulder manipulation. This is a procedure to move the shoulder into another position. It is done after you are given a medicine to make you fall asleep (general anesthetic). The joint may also be injected with salt water at high pressure to break down scarring.  Surgery. This may be done in severe cases when other treatments have  failed. Although most people recover completely from adhesive capsulitis, some may not regain the full movement of the shoulder. Follow these instructions at home:  Take over-the-counter and prescription medicines only as told by your health care provider.  If you are being treated with physical therapy, follow instructions from your physical therapist.  Avoid exercises that put a lot of demand on your shoulder, such as throwing. These exercises can make pain worse.  If directed, apply ice to the injured area:  Put ice in a plastic bag.  Place a towel between your skin and the bag.  Leave the ice on for 20 minutes, 2-3 times per day. Contact a health care provider if:  You develop new symptoms.  Your symptoms get worse. This information is not intended to replace advice given to you by your health care provider. Make sure you discuss any questions you have with your health care provider. Document Released: 09/23/2009 Document Revised: 05/03/2016 Document Reviewed: 03/21/2015 Elsevier Interactive Patient Education  2017 [employer].

## 2017-03-05 NOTE — Progress Notes (Signed)
FOLLOW UP VISIT   Patient ID: Carla Wise, female   DOB: [birth year], 51 y.o.   MRN: [id number]  Chief Complaint  Patient presents with  . Follow-up    LEFT ARM PAIN    HPI Carla Wise is a 51 y.o. female.   HPI  Carla Wise's 51 years old she comes back in after being seen in February for atraumatic onset of left shoulder pain radiating in her left arm. We gave her an injection and put her on steroid Dosepak and she improved a little bit but then the pain started to worsen again and now she's having further decreases in her ability to move her arm. She notices loss of motion increasing stiffness and increasing pain in the left shoulder  Review of Systems Review of Systems  She denies neck pain numbness tingling or radicular pain in the left arm   Physical Exam  Today's examination shows global range of motion loss in all planes with tenderness in the periapical acromial region including the anterior and posterior joint lines stability tests were limited by inability to get the arm into abduction external rotation internal and external rotation strength were normal skin was intact there was no rash at a good pulses no lymphadenopathy sensation was normal reflexes were good and she had no neck tenderness   MEDICAL DECISION MAKING  DATA     Encounter Diagnoses  Name Primary?  . Adhesive capsulitis of left shoulder Yes  . Tear of left rotator cuff, unspecified tear extent       PLAN(RISK)    Recommend MRI of the shoulder to evaluate for rotator cuff tear versus adhesive capsulitis Start prednisone 10 mg by mouth daily

## 2017-03-06 ENCOUNTER — Encounter (HOSPITAL_COMMUNITY): Payer: Self-pay | Admitting: Gastroenterology

## 2017-03-12 ENCOUNTER — Ambulatory Visit (HOSPITAL_COMMUNITY)
Admission: RE | Admit: 2017-03-12 | Discharge: 2017-03-12 | Disposition: A | Discharge status: Home / Self Care | Payer: BLUE CROSS/BLUE SHIELD | Source: Ambulatory Visit | Attending: Orthopedic Surgery | Admitting: Orthopedic Surgery

## 2017-03-12 DIAGNOSIS — M75102 Unspecified rotator cuff tear or rupture of left shoulder, not specified as traumatic: Secondary | ICD-10-CM

## 2017-03-12 DIAGNOSIS — M659 Synovitis and tenosynovitis, unspecified: Secondary | ICD-10-CM | POA: Diagnosis not present

## 2017-03-12 DIAGNOSIS — M25412 Effusion, left shoulder: Secondary | ICD-10-CM | POA: Insufficient documentation

## 2017-03-12 DIAGNOSIS — M7582 Other shoulder lesions, left shoulder: Secondary | ICD-10-CM | POA: Diagnosis not present

## 2017-03-13 ENCOUNTER — Encounter: Payer: Self-pay | Admitting: Orthopedic Surgery

## 2017-03-13 ENCOUNTER — Telehealth: Payer: Self-pay | Admitting: Orthopedic Surgery

## 2017-03-13 NOTE — Telephone Encounter (Signed)
Called patient to relay; note issued accordingly.

## 2017-03-13 NOTE — Telephone Encounter (Signed)
Yes oow until mri fu

## 2017-03-13 NOTE — Telephone Encounter (Signed)
Patient called, states had MRI, and is awaiting to see Dr Aline Brochure on 03/22/17 (also is on our wait list in event of cancellation) - asking for work note for either no use of left shoulder and left arm or to be out of work until appointment date 03/22/17. Please advise. Patient PH# [phone] (Mobile) *Preferred*

## 2017-03-15 ENCOUNTER — Other Ambulatory Visit: Payer: Self-pay | Admitting: Orthopedic Surgery

## 2017-03-15 DIAGNOSIS — M7502 Adhesive capsulitis of left shoulder: Secondary | ICD-10-CM

## 2017-03-15 MED ORDER — PREDNISONE 10 MG (48) PO TBPK: ORAL_TABLET | Freq: Every day | ORAL | 0 refills | Status: DC

## 2017-03-18 ENCOUNTER — Telehealth: Payer: Self-pay | Admitting: Gastroenterology

## 2017-03-18 ENCOUNTER — Telehealth: Payer: Self-pay

## 2017-03-18 ENCOUNTER — Encounter: Payer: Self-pay | Admitting: Gastroenterology

## 2017-03-18 MED ORDER — LINACLOTIDE 290 MCG PO CAPS: 290.0000 ug | ORAL_CAPSULE | Freq: Every day | ORAL | 3 refills | Status: DC

## 2017-03-18 NOTE — Telephone Encounter (Signed)
Tried to call with no answer  

## 2017-03-18 NOTE — Telephone Encounter (Signed)
rx done. Tell them to look up rebate card or come by for one.

## 2017-03-18 NOTE — Telephone Encounter (Signed)
Pt said the Linzess 290 mcg works well and she would like a prescription sent to the pharmacy.

## 2017-03-18 NOTE — Telephone Encounter (Signed)
OV made °

## 2017-03-18 NOTE — Telephone Encounter (Signed)
Pt is aware and rebate card is at front for pick up.

## 2017-03-18 NOTE — Telephone Encounter (Signed)
Please call pt. 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   SHE SHOULD HAVE A COLONOSCOPY AS SOON AS HER INSURANCE WILL PAY FOR IT.   FOLLOW A LOW FAT DIET. MEATS SHOULD BE BAKED, BROILED, OR BOILED.   AVOID REFLUX TRIGGERS.    FOLLOW UP IN JUN 2018 E30 DYSPHAGIA/GERD.

## 2017-03-18 NOTE — Telephone Encounter (Signed)
LMOM on the home number for a return call.

## 2017-03-18 NOTE — Telephone Encounter (Signed)
Pt is aware and said it should be August this year when the insurance will pay for her colonoscopy and she will let us know.

## 2017-03-19 ENCOUNTER — Ambulatory Visit: Payer: BLUE CROSS/BLUE SHIELD | Admitting: Orthopedic Surgery

## 2017-03-22 ENCOUNTER — Encounter: Payer: Self-pay | Admitting: Orthopedic Surgery

## 2017-03-22 ENCOUNTER — Ambulatory Visit (INDEPENDENT_AMBULATORY_CARE_PROVIDER_SITE_OTHER): Payer: BLUE CROSS/BLUE SHIELD | Admitting: Orthopedic Surgery

## 2017-03-22 DIAGNOSIS — M7502 Adhesive capsulitis of left shoulder: Secondary | ICD-10-CM

## 2017-03-22 MED ORDER — HYDROCODONE-ACETAMINOPHEN 5-325 MG PO TABS: 1.0000 | ORAL_TABLET | Freq: Four times a day (QID) | ORAL | 0 refills | Status: DC | PRN

## 2017-03-22 NOTE — Patient Instructions (Signed)
Home exercises to pain tolerance   You will be scheduled for joint injection left shoulder with steroids

## 2017-03-22 NOTE — Addendum Note (Signed)
Addended by: Baldomero Lamy B on: 03/22/2017 12:26 PM   Modules accepted: Orders

## 2017-03-22 NOTE — Progress Notes (Signed)
  Follow-up visit  Chief complaint severe left shoulder pain. The patient's been on 2 courses of steroids orally had a subacromial injection without relief  She was sent for MRI MRI shows inflammation of the joint without rotator cuff tear. There is no labral tear. Significant amount of synovitis is noted. Tendinitis and tendinosis and tendinopathy of the cuff with comment of glenohumeral degenerative joint changes with which are not seen on plain film   Carla Wise's 51 years old she comes back in after being seen in February for atraumatic onset of left shoulder pain radiating in her left arm. We gave her an injection and put her on steroid Dosepak and she improved a little bit but then the pain started to worsen again and now she's having further decreases in her ability to move her arm. She notices loss of motion increasing stiffness and increasing pain in the left shoulder   System review no new symptoms such as numbness tingling  Exam shows Tenderness around the glenohumeral joint and lateral deltoid. 30 of external rotation with her arm at her side abduction is only 70 active and passive.  There is no inferior subluxation. Internal/external rotation strength against resistance is normal. I do not see a skin rash. Neurovascular exam is intact  Encounter Diagnosis  Name Primary?  . Adhesive capsulitis of left shoulder Yes    Plan Schedule intra-articular injection left shoulder with 40 mg of Depo-Medrol and 3 mL 1% lidocaine  Start Codman exercises numbers 1 through 3 as tolerated  Start Norco 5 mg every 6 #42  Return 1 week

## 2017-03-27 ENCOUNTER — Encounter (HOSPITAL_COMMUNITY): Payer: Self-pay

## 2017-03-27 ENCOUNTER — Ambulatory Visit (HOSPITAL_COMMUNITY)
Admission: RE | Admit: 2017-03-27 | Discharge: 2017-03-27 | Disposition: A | Discharge status: Home / Self Care | Payer: BLUE CROSS/BLUE SHIELD | Source: Ambulatory Visit | Attending: Orthopedic Surgery | Admitting: Orthopedic Surgery

## 2017-03-27 DIAGNOSIS — M7502 Adhesive capsulitis of left shoulder: Secondary | ICD-10-CM | POA: Diagnosis not present

## 2017-03-27 DIAGNOSIS — M25512 Pain in left shoulder: Secondary | ICD-10-CM | POA: Diagnosis not present

## 2017-03-27 MED ORDER — IOPAMIDOL (ISOVUE-300) INJECTION 61%
INTRAVENOUS | Status: AC
  Administered 2017-03-27: 2 mL
  Filled 2017-03-27: qty 50

## 2017-03-27 MED ORDER — LIDOCAINE HCL (PF) 2 % IJ SOLN
INTRAMUSCULAR | Status: AC
  Administered 2017-03-27: 2 mL
  Filled 2017-03-27: qty 10

## 2017-03-27 MED ORDER — LIDOCAINE HCL (PF) 1 % IJ SOLN
INTRAMUSCULAR | Status: AC
  Filled 2017-03-27: qty 5

## 2017-03-27 MED ORDER — BUPIVACAINE HCL (PF) 0.25 % IJ SOLN
2.0000 mL | Freq: Once | INTRAMUSCULAR | Status: AC
  Administered 2017-03-27: 1 mL
  Filled 2017-03-27: qty 30

## 2017-03-27 MED ORDER — METHYLPREDNISOLONE ACETATE 40 MG/ML IJ SUSP
INTRAMUSCULAR | Status: AC
  Administered 2017-03-27: 40 mg
  Filled 2017-03-27: qty 1

## 2017-03-27 NOTE — Progress Notes (Signed)
REVIEWED-NO ADDITIONAL RECOMMENDATIONS. 

## 2017-03-29 ENCOUNTER — Ambulatory Visit (INDEPENDENT_AMBULATORY_CARE_PROVIDER_SITE_OTHER): Payer: BLUE CROSS/BLUE SHIELD | Admitting: Orthopedic Surgery

## 2017-03-29 ENCOUNTER — Encounter: Payer: Self-pay | Admitting: Orthopedic Surgery

## 2017-03-29 DIAGNOSIS — M7502 Adhesive capsulitis of left shoulder: Secondary | ICD-10-CM | POA: Diagnosis not present

## 2017-03-29 MED ORDER — DICLOFENAC POTASSIUM 50 MG PO TABS: 50.0000 mg | ORAL_TABLET | Freq: Two times a day (BID) | ORAL | 3 refills | Status: DC

## 2017-03-29 MED ORDER — PROMETHAZINE HCL 12.5 MG PO TABS: 12.5000 mg | ORAL_TABLET | Freq: Four times a day (QID) | ORAL | 2 refills | Status: DC | PRN

## 2017-03-29 NOTE — Patient Instructions (Addendum)
START OT   OOW 3 WEEKS

## 2017-03-29 NOTE — Progress Notes (Signed)
Follow-up visit status post injection intra-articular cortisone shot left shoulder  Patient has received capsulitis of the left shoulder. She still having quite a bit of pain although the shot did give her some mild relief  She is not taking any hydrocodone because it's making her sick on her stomach  She finished her prednisone Dosepak she's had one subacromial injection as well  Review of systems Neurologic symptoms are negative  Exam shows well-developed well-nourished female grooming hygiene intact she appears to be in distress with an anxious look on her face.  Her neurovascular exam remains intact  Her shoulder external rotation with her arm at the side is about 30 which is improved abduction is only 70 and her extension is 30.  Encounter Diagnosis  Name Primary?  . Adhesive capsulitis of left shoulder Yes    We recommend that she continue with Phenergan to help with the nausea continue hydrocodone and start diclofenac for an anti-inflammatory medication  Follow-up 3 weeks  Out of work 3 weeks

## 2017-04-04 ENCOUNTER — Ambulatory Visit (HOSPITAL_COMMUNITY): Payer: BLUE CROSS/BLUE SHIELD | Attending: Orthopedic Surgery

## 2017-04-04 ENCOUNTER — Encounter (HOSPITAL_COMMUNITY): Payer: Self-pay

## 2017-04-04 DIAGNOSIS — M25612 Stiffness of left shoulder, not elsewhere classified: Secondary | ICD-10-CM | POA: Insufficient documentation

## 2017-04-04 DIAGNOSIS — R29898 Other symptoms and signs involving the musculoskeletal system: Secondary | ICD-10-CM | POA: Diagnosis not present

## 2017-04-04 DIAGNOSIS — M25512 Pain in left shoulder: Secondary | ICD-10-CM | POA: Insufficient documentation

## 2017-04-04 NOTE — Patient Instructions (Signed)
1) Seated Row   Sit up straight with elbows by your sides. Pull back with shoulders/elbows, keeping forearms straight, as if pulling back on the reins of a horse. Squeeze shoulder blades together. Repeat _10-12__times, ____sets/day    2) Shoulder Elevation    Sit up straight with arms by your sides. Slowly bring your shoulders up towards your ears. Repeat_10-12__times, ____ sets/day    3) Shoulder Extension    Sit up straight with both arms by your side, draw your arms back behind your waist. Keep your elbows straight. Repeat _10-12___times, ____sets/day.  SHOULDER: Flexion On Table   Place hands on table, elbows straight. Move hips away from body. Press hands down into table. Hold __5_ seconds. __10_ reps per set, ___ sets per day, ___ days per week  Abduction (Passive)   With arm out to side, resting on table, lower head toward arm, keeping trunk away from table. Hold __5__ seconds. Repeat _10___ times. Do ____ sessions per day.  Copyright  VHI. All rights reserved.     Internal Rotation (Assistive)   Seated with elbow bent at right angle and held against side, slide arm on table surface in an inward arc. Repeat _10___ times. Do ____ sessions per day. Activity: Use this motion to brush crumbs off the table.  Copyright  VHI. All rights reserved.

## 2017-04-05 NOTE — Therapy (Signed)
[place], [place], [address] Phone: [phone]   Fax:  [phone]  Occupational Therapy Evaluation  Patient Details  Name: Carla Wise MRN: [id number] Date of Birth: Mar 03, 1966 Referring Provider: [name], MD  Encounter Date: 04/04/2017      OT End of Session - 04/05/17 2222    Visit Number 1   Number of Visits 12   Date for OT Re-Evaluation 05/17/17   Authorization Type BCBS    Authorization Time Period 30 visit limit with 0 used this year.   Authorization - Visit Number 1   Authorization - Number of Visits 30   OT Start Time 0300   OT Stop Time 1345   OT Time Calculation (min) 40 min   Activity Tolerance Patient tolerated treatment well   Behavior During Therapy WFL for tasks assessed/performed      Past Medical History:  Diagnosis Date  . Constipation   . Gastric adenoma 02/2017  . PONV (postoperative nausea and vomiting)     Past Surgical History:  Procedure Laterality Date  . ESOPHAGOGASTRODUODENOSCOPY N/A 03/01/2017   Procedure: ESOPHAGOGASTRODUODENOSCOPY (EGD);  Surgeon: Danie Binder, MD;  [facility];  Service: Endoscopy;  Laterality: N/A;  . FOOT SURGERY  2003  . KNEE ARTHROSCOPY W/ MENISCECTOMY     left knee  . SAVORY DILATION N/A 03/01/2017   Procedure: SAVORY DILATION;  Surgeon: Danie Binder, MD;  [facility];  Service: Endoscopy;  Laterality: N/A;    There were no vitals filed for this visit.     04/04/17 1316  Assessment  Diagnosis Left shoulder adhesive capsulitis  Referring Provider [name], MD  Onset Date (3 months ago - Feb. 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  ADL  ADL comments Unable to hook/unhook, unable to hold brush to brush hair, reaching to shoulder level or above.  Mobility  Mobility Status Independent  Written Expression  Dominant Hand Right  Cognition  Overall Cognitive Status Within Functional Limits for tasks assessed  ROM / Strength  AROM / PROM / Strength AROM;PROM;Strength  Palpation  Palpation comment Max fascial restrictions in left upper arm, trapezius, and scapularis region.  AROM  AROM Assessment Site Shoulder  Overall AROM Comments assessed seated. IR/er adducted.   Right/Left Shoulder Left  Left Shoulder Flexion 80 Degrees  Left Shoulder ABduction 60 Degrees  Left Shoulder Internal Rotation 90 Degrees  Left Shoulder External Rotation 16 Degrees  PROM  Overall PROM Comments Assessed supine. IR/er adducted  PROM Assessment Site Shoulder  Right/Left Shoulder Left  Left Shoulder Flexion 95 Degrees  Left Shoulder ABduction 65 Degrees  Left Shoulder Internal Rotation 90 Degrees  Left Shoulder External Rotation 20 Degrees  Strength  Overall Strength Comments Assessed seated. IR/er adducted  Strength Assessment Site Shoulder  Right/Left Shoulder Left  Left Shoulder Flexion 3-/5  Left Shoulder ABduction 3-/5  Left Shoulder Internal Rotation 3/5  Left Shoulder External Rotation 3-/5  OT Education - 04/05/17 2221    Education provided Yes   Education Details seated scapular A/ROM exercises and table slides.    Person(s) Educated Patient   Methods Demonstration;Explanation;Verbal cues;Handout;Tactile cues   Comprehension Returned demonstration;Verbalized understanding          OT Short Term Goals - 04/05/17 2234      OT SHORT TERM GOAL #1   Title Patient will be educated and independent with HEP to increase functional use  of LUE during daily tasks.    Time 3   Period Weeks   Status New     OT SHORT TERM GOAL #2   Title Patient will increase P/ROM to [facility] to increase ability to get shirts on and off with less difficulty.   Time 3   Period Weeks   Status New     OT SHORT TERM GOAL #3   Title Patient will increase LUE strength to 3/5 to increase functional reaching ability to increase ability to brush hair.   Time 3   Period Days   Status New     OT SHORT TERM GOAL #4   Title Patient will decrease pain level in LUE to 5/10 during light household tasks.    Time 3   Period Weeks   Status New     OT SHORT TERM GOAL #5   Title patient will decrease fascial restrictions to mod amount to increase ability to complete tasks at shoulder with less difficulty.   Time 3   Period Weeks   Status New           OT Long Term Goals - 04/05/17 2241      OT LONG TERM GOAL #1   Title Patient will return to highest level of independence when using LUE for all daily and work related tasks.    Time 6   Period Weeks   Status New     OT LONG TERM GOAL #2   Title Patient will decrease fascial restrictions to min amount or less in order to increase functional reaching ability.   Time 6   Period Weeks   Status New     OT LONG TERM GOAL #3   Title Patient will decrease pain level in LUE during work activities to a 3/10 or less.   Time 6   Period Weeks   Status New     OT LONG TERM GOAL #4   Title Patient will increase A/ROM to [facility] to increase ability to hook and unhook bra.   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   Time 6   Period Weeks   Status New               Plan - 04/04/17 2226    Clinical Impression Statement A: Patient is a 51 y/o female S/P Left shoulder adhesive capsulitis causing increased pain, fascial restrictions, and decreased ROM and strength  resulting in difficulty completing daily tasks with LUE.     Rehab Potential Excellent   OT Frequency 2x / week   OT Duration 6 weeks   OT Treatment/Interventions Therapeutic exercise;DME and/or AE instruction;Cryotherapy;Self-care/ADL training;Electrical Stimulation;Moist Heat;Ultrasound;Passive range of motion;Manual Therapy;Therapeutic activities;Patient/family education   Plan P: Patient will benefit from skilled OT services to increase functional performance during daily tasks when using LUE.  Treatment Plan: myofascial release, manual stretching, P/ROM, AA/ROM, A/ROM, and general strengthening.   OT Home Exercise Plan 4/26: scapular A/ROM exercises and table slides.    Consulted and Agree with Plan of Care Patient      Patient will benefit from skilled therapeutic intervention in order to improve the following deficits and impairments:  Pain, Decreased range of motion, Decreased strength, Increased fascial restricitons, Impaired UE functional use  Visit Diagnosis: Acute pain of left shoulder - Plan: Ot plan of care cert/re-cert  Other symptoms and signs involving the musculoskeletal system - Plan: Ot plan of care cert/re-cert  Stiffness of left shoulder, not elsewhere classified - Plan: Ot plan of care cert/re-cert    Problem List Patient Active Problem List   Diagnosis Date Noted  . Dyspepsia and disorder of function of stomach   . Constipation 02/15/2017  . Dysphagia 02/15/2017  . Encounter for screening colonoscopy 02/15/2017  . Arthritis of knee, degenerative 03/09/2014  . Knee pain 08/21/2011  . Stiffness of joint, not elsewhere classified, lower leg 08/21/2011  . Medial meniscus, posterior horn derangement 08/02/2011  . Torn meniscus 07/19/2011  . HIP PAIN 02/16/2009   Ailene Ravel, OTR/L,CBIS  [phone]  04/05/2017, 10:53 PM  [place] [address] [place], [place], [address] Phone: [phone]   Fax:   [phone]  Name: Carla Wise MRN: [id number] Date of Birth: Jun 20, 1966

## 2017-04-11 ENCOUNTER — Encounter (HOSPITAL_COMMUNITY): Payer: Self-pay

## 2017-04-11 ENCOUNTER — Ambulatory Visit (HOSPITAL_COMMUNITY): Payer: BLUE CROSS/BLUE SHIELD | Attending: Orthopedic Surgery

## 2017-04-11 DIAGNOSIS — M25612 Stiffness of left shoulder, not elsewhere classified: Secondary | ICD-10-CM

## 2017-04-11 DIAGNOSIS — R29898 Other symptoms and signs involving the musculoskeletal system: Secondary | ICD-10-CM

## 2017-04-11 DIAGNOSIS — M25512 Pain in left shoulder: Secondary | ICD-10-CM | POA: Insufficient documentation

## 2017-04-11 NOTE — Therapy (Signed)
[place], [place], [address] Phone: [phone]   Fax:  [phone]  Occupational Therapy Treatment  Patient Details  Name: Carla Wise MRN: [id number] Date of Birth: 10-07-1966 Referring Provider: [name], MD  Encounter Date: 04/11/2017      OT End of Session - 04/11/17 1343    Visit Number 2   Number of Visits 12   Date for OT Re-Evaluation 05/17/17   Authorization Type BCBS    Authorization Time Period 30 visit limit with 0 used this year.   Authorization - Visit Number 2   Authorization - Number of Visits 30   OT Start Time 1120   OT Stop Time 1200   OT Time Calculation (min) 40 min   Activity Tolerance Patient tolerated treatment well   Behavior During Therapy WFL for tasks assessed/performed      Past Medical History:  Diagnosis Date  . Constipation   . Gastric adenoma 02/2017  . PONV (postoperative nausea and vomiting)     Past Surgical History:  Procedure Laterality Date  . ESOPHAGOGASTRODUODENOSCOPY N/A 03/01/2017   Procedure: ESOPHAGOGASTRODUODENOSCOPY (EGD);  Surgeon: Danie Binder, MD;  [facility];  Service: Endoscopy;  Laterality: N/A;  . FOOT SURGERY  2003  . KNEE ARTHROSCOPY W/ MENISCECTOMY     left knee  . SAVORY DILATION N/A 03/01/2017   Procedure: SAVORY DILATION;  Surgeon: Danie Binder, MD;  [facility];  Service: Endoscopy;  Laterality: N/A;    There were no vitals filed for this visit.      Subjective Assessment - 04/11/17 1154    Subjective  S: My shoulder is still hurting.    Currently in Pain? Yes   Pain Score 6    Pain Location Shoulder   Pain Orientation Left   Pain Descriptors / Indicators Sharp;Constant   Pain Radiating Towards n/a   Pain Onset More than a month ago   Pain Frequency Constant   Aggravating Factors  using it   Pain Relieving Factors extra strength tylonel.    Effect of Pain on Daily Activities severe effect    Multiple Pain Sites No            [facility] OT Assessment - 04/11/17 1336      Assessment   Diagnosis Left shoulder adhesive capsulitis     Precautions   Precautions None                  OT Treatments/Exercises (OP) - 04/11/17 1336      Exercises   Exercises Shoulder     Shoulder Exercises: Supine   Protraction PROM;10 reps   Horizontal ABduction PROM;10 reps   External Rotation PROM;10 reps   Internal Rotation PROM;10 reps   Flexion PROM;10 reps   ABduction PROM;10 reps     Shoulder Exercises: Seated   Elevation AROM;10 reps   Extension AROM;10 reps   Row AROM;10 reps     Shoulder Exercises: Pulleys   Flexion 1 minute   ABduction 1 minute     Shoulder Exercises: Therapy Ball   Flexion --  12X   ABduction --  12X     Shoulder Exercises: ROM/Strengthening   Wall Wash 1'   Thumb Tacks 1'     Manual Therapy   Manual Therapy Myofascial release   Manual therapy comments manual therapy completed prior to exercises.    Myofascial Release Myofascial release and manual stretching completed to left  upper arm, trapezius, and scapular region to decrease fascial restrictions and increase joint mobility in a pain free zone.                 OT Education - 04/11/17 1342    Education provided Yes   Education Details Pt was given OT evaluation handout. Reviewed goals and plan of care.   Person(s) Educated Patient   Methods Explanation;Handout   Comprehension Verbalized understanding          OT Short Term Goals - 04/11/17 1344      OT SHORT TERM GOAL #1   Title Patient will be educated and independent with HEP to increase functional use of LUE during daily tasks.    Time 3   Period Weeks   Status On-going     OT SHORT TERM GOAL #2   Title Patient will increase P/ROM to [facility] to increase ability to get shirts on and off with less difficulty.   Time 3   Period Weeks   Status On-going     OT SHORT TERM GOAL #3   Title Patient will increase  LUE strength to 3/5 to increase functional reaching ability to increase ability to brush hair.   Time 3   Period Weeks   Status On-going     OT SHORT TERM GOAL #4   Title Patient will decrease pain level in LUE to 5/10 during light household tasks.    Time 3   Period Weeks   Status On-going     OT SHORT TERM GOAL #5   Title patient will decrease fascial restrictions to mod amount to increase ability to complete tasks at shoulder with less difficulty.   Time 3   Period Weeks   Status On-going           OT Long Term Goals - 04/11/17 1344      OT LONG TERM GOAL #1   Title Patient will return to highest level of independence when using LUE for all daily and work related tasks.    Time 6   Period Weeks   Status On-going     OT LONG TERM GOAL #2   Title Patient will decrease fascial restrictions to min amount or less in order to increase functional reaching ability.   Time 6   Period Weeks   Status On-going     OT LONG TERM GOAL #3   Title Patient will decrease pain level in LUE during work activities to a 3/10 or less.   Time 6   Period Weeks   Status On-going     OT LONG TERM GOAL #4   Title Patient will increase A/ROM to [facility] to increase ability to hook and unhook bra.   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   Time 6   Period Weeks   Status On-going               Plan - 04/11/17 1343    Clinical Impression Statement A: Initiated myofascial release, manual stretching, P/ROM, and therapy ball stretches. Patient continues to have increased pain and trigger points resulting in limited ROM. VC for form and technique.   Plan P: Add PVC pipe slide.      Patient will benefit from skilled therapeutic intervention in order to improve the  following deficits and impairments:  Pain, Decreased range of motion, Decreased strength,  Increased fascial restricitons, Impaired UE functional use  Visit Diagnosis: Other symptoms and signs involving the musculoskeletal system  Acute pain of left shoulder  Stiffness of left shoulder, not elsewhere classified    Problem List Patient Active Problem List   Diagnosis Date Noted  . Dyspepsia and disorder of function of stomach   . Constipation 02/15/2017  . Dysphagia 02/15/2017  . Encounter for screening colonoscopy 02/15/2017  . Arthritis of knee, degenerative 03/09/2014  . Knee pain 08/21/2011  . Stiffness of joint, not elsewhere classified, lower leg 08/21/2011  . Medial meniscus, posterior horn derangement 08/02/2011  . Torn meniscus 07/19/2011  . HIP PAIN 02/16/2009   Ailene Ravel, OTR/L,CBIS  [phone]  04/11/2017, 1:46 PM  [place] [address] [place], [place], [address] Phone: [phone]   Fax:  [phone]  Name: KALEEN ROCHETTE MRN: [id number] Date of Birth: May 07, 1966

## 2017-04-15 ENCOUNTER — Ambulatory Visit (HOSPITAL_COMMUNITY): Payer: BLUE CROSS/BLUE SHIELD

## 2017-04-15 ENCOUNTER — Encounter (HOSPITAL_COMMUNITY): Payer: Self-pay

## 2017-04-15 DIAGNOSIS — M25612 Stiffness of left shoulder, not elsewhere classified: Secondary | ICD-10-CM

## 2017-04-15 DIAGNOSIS — M25512 Pain in left shoulder: Secondary | ICD-10-CM

## 2017-04-15 DIAGNOSIS — R29898 Other symptoms and signs involving the musculoskeletal system: Secondary | ICD-10-CM | POA: Diagnosis not present

## 2017-04-15 NOTE — Therapy (Signed)
[place], [place], [address] Phone: [phone]   Fax:  [phone]  Occupational Therapy Treatment  Patient Details  Name: Carla Wise MRN: [id number] Date of Birth: [birth year] Referring Provider: [name], MD  Encounter Date: 04/15/2017      OT End of Session - 04/15/17 1140    Visit Number 3   Number of Visits 12   Date for OT Re-Evaluation 05/17/17   Authorization Type BCBS    Authorization Time Period 30 visit limit with 0 used this year.   Authorization - Visit Number 3   Authorization - Number of Visits 30   OT Start Time [phone]   OT Stop Time 1030   OT Time Calculation (min) 40 min   Activity Tolerance Patient tolerated treatment well   Behavior During Therapy WFL for tasks assessed/performed      Past Medical History:  Diagnosis Date  . Constipation   . Gastric adenoma 02/2017  . PONV (postoperative nausea and vomiting)     Past Surgical History:  Procedure Laterality Date  . ESOPHAGOGASTRODUODENOSCOPY N/A 03/01/2017   Procedure: ESOPHAGOGASTRODUODENOSCOPY (EGD);  Surgeon: Danie Binder, MD;  [facility];  Service: Endoscopy;  Laterality: N/A;  . FOOT SURGERY  2003  . KNEE ARTHROSCOPY W/ MENISCECTOMY     left knee  . SAVORY DILATION N/A 03/01/2017   Procedure: SAVORY DILATION;  Surgeon: Danie Binder, MD;  [facility];  Service: Endoscopy;  Laterality: N/A;    There were no vitals filed for this visit.      Subjective Assessment - 04/15/17 1018    Subjective  S: The weekend was a bad one.   Currently in Pain? Yes   Pain Score 7    Pain Location Shoulder   Pain Orientation Left   Pain Descriptors / Indicators Constant;Sharp   Pain Type Acute pain                      OT Treatments/Exercises (OP) - 04/15/17 1019      Exercises   Exercises Shoulder     Shoulder Exercises: Supine   Protraction PROM;10 reps   Horizontal ABduction  PROM;10 reps   External Rotation PROM;10 reps   Internal Rotation PROM;10 reps   Flexion PROM;10 reps   ABduction PROM;10 reps     Shoulder Exercises: Pulleys   Flexion 1 minute   ABduction 1 minute     Shoulder Exercises: ROM/Strengthening   Wall Wash 1'   Thumb Tacks 1'   Prot/Ret//Elev/Dep 1'   Other ROM/Strengthening Exercises PVC pipe slide; flexion and abduction. 10X each                  OT Short Term Goals - 04/11/17 1344      OT SHORT TERM GOAL #1   Title Patient will be educated and independent with HEP to increase functional use of LUE during daily tasks.    Time 3   Period Weeks   Status On-going     OT SHORT TERM GOAL #2   Title Patient will increase P/ROM to [facility] to increase ability to get shirts on and off with less difficulty.   Time 3   Period Weeks   Status On-going     OT SHORT TERM GOAL #3   Title Patient will increase LUE strength to 3/5 to increase functional reaching ability to increase ability to brush hair.   Time  3   Period Weeks   Status On-going     OT SHORT TERM GOAL #4   Title Patient will decrease pain level in LUE to 5/10 during light household tasks.    Time 3   Period Weeks   Status On-going     OT SHORT TERM GOAL #5   Title patient will decrease fascial restrictions to mod amount to increase ability to complete tasks at shoulder with less difficulty.   Time 3   Period Weeks   Status On-going           OT Long Term Goals - 04/11/17 1344      OT LONG TERM GOAL #1   Title Patient will return to highest level of independence when using LUE for all daily and work related tasks.    Time 6   Period Weeks   Status On-going     OT LONG TERM GOAL #2   Title Patient will decrease fascial restrictions to min amount or less in order to increase functional reaching ability.   Time 6   Period Weeks   Status On-going     OT LONG TERM GOAL #3   Title Patient will decrease pain level in LUE during work activities to a  3/10 or less.   Time 6   Period Weeks   Status On-going     OT LONG TERM GOAL #4   Title Patient will increase A/ROM to [facility] to increase ability to hook and unhook bra.   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   Time 6   Period Weeks   Status On-going               Plan - 04/15/17 1140    Clinical Impression Statement A: Completed PVC pipe slide to increase functional reaching ability of LUE. Pt continues to have increased pain with manual stretching. Soreness voiced during exercises. VC for form and technique.   Plan P: Add AA/ROM supine and standing.      Patient will benefit from skilled therapeutic intervention in order to improve the following deficits and impairments:  Pain, Decreased range of motion, Decreased strength, Increased fascial restricitons, Impaired UE functional use  Visit Diagnosis: Other symptoms and signs involving the musculoskeletal system  Acute pain of left shoulder  Stiffness of left shoulder, not elsewhere classified    Problem List Patient Active Problem List   Diagnosis Date Noted  . Dyspepsia and disorder of function of stomach   . Constipation 02/15/2017  . Dysphagia 02/15/2017  . Encounter for screening colonoscopy 02/15/2017  . Arthritis of knee, degenerative 03/09/2014  . Knee pain 08/21/2011  . Stiffness of joint, not elsewhere classified, lower leg 08/21/2011  . Medial meniscus, posterior horn derangement 08/02/2011  . Torn meniscus 07/19/2011  . HIP PAIN 02/16/2009   Carla Wise, OTR/L,CBIS  [phone]  04/15/2017, 11:42 AM  [place], [place], [address] Phone: [phone]   Fax:  [phone]  Name: Carla Wise MRN: [id number] Date of Birth: 1966-04-18

## 2017-04-18 ENCOUNTER — Ambulatory Visit (HOSPITAL_COMMUNITY): Payer: BLUE CROSS/BLUE SHIELD

## 2017-04-18 DIAGNOSIS — M25612 Stiffness of left shoulder, not elsewhere classified: Secondary | ICD-10-CM | POA: Diagnosis not present

## 2017-04-18 DIAGNOSIS — R29898 Other symptoms and signs involving the musculoskeletal system: Secondary | ICD-10-CM | POA: Diagnosis not present

## 2017-04-18 DIAGNOSIS — M25512 Pain in left shoulder: Secondary | ICD-10-CM | POA: Diagnosis not present

## 2017-04-18 NOTE — Therapy (Signed)
[place], [place], [address] Phone: [phone]   Fax:  [phone]  Occupational Therapy Treatment  Patient Details  Name: Carla Wise MRN: [id number] Date of Birth: 09/20/1966 Referring Provider: [name], MD  Encounter Date: 04/18/2017      OT End of Session - 04/18/17 1229    Visit Number 4   Number of Visits 12   Date for OT Re-Evaluation 05/17/17   Authorization Type BCBS    Authorization Time Period 30 visit limit with 0 used this year.   Authorization - Visit Number 4   Authorization - Number of Visits 30   OT Start Time 1120   OT Stop Time 1200   OT Time Calculation (min) 40 min   Activity Tolerance Patient tolerated treatment well   Behavior During Therapy WFL for tasks assessed/performed      Past Medical History:  Diagnosis Date  . Constipation   . Gastric adenoma 02/2017  . PONV (postoperative nausea and vomiting)     Past Surgical History:  Procedure Laterality Date  . ESOPHAGOGASTRODUODENOSCOPY N/A 03/01/2017   Procedure: ESOPHAGOGASTRODUODENOSCOPY (EGD);  Surgeon: Danie Binder, MD;  [facility];  Service: Endoscopy;  Laterality: N/A;  . FOOT SURGERY  2003  . KNEE ARTHROSCOPY W/ MENISCECTOMY     left knee  . SAVORY DILATION N/A 03/01/2017   Procedure: SAVORY DILATION;  Surgeon: Danie Binder, MD;  [facility];  Service: Endoscopy;  Laterality: N/A;    There were no vitals filed for this visit.          [facility] OT Assessment - 04/18/17 1121      Assessment   Diagnosis Left shoulder adhesive capsulitis     Precautions   Precautions None     Palpation   Palpation comment Min fascial restrictions in left upper arm, trapezius, and scapularis region.     AROM   Overall AROM Comments assessed seated. IR/er adducted.    AROM Assessment Site Shoulder   Right/Left Shoulder Left   Left Shoulder Flexion 101 Degrees  previous: 80   Left  Shoulder ABduction 97 Degrees  previous: 60   Left Shoulder Internal Rotation 90 Degrees  previous: 90   Left Shoulder External Rotation 28 Degrees  previous: 16     PROM   Overall PROM Comments Assessed supine. IR/er adducted   PROM Assessment Site Shoulder   Right/Left Shoulder Left   Left Shoulder Flexion 112 Degrees  previous: 95   Left Shoulder ABduction 90 Degrees  previous: 65   Left Shoulder Internal Rotation 90 Degrees  previous: same   Left Shoulder External Rotation 25 Degrees  previous: 20     Strength   Overall Strength Comments Assessed seated. IR/er adducted   Strength Assessment Site Shoulder   Right/Left Shoulder Left   Left Shoulder Flexion 3-/5  previous: same   Left Shoulder ABduction 3-/5  previous: same   Left Shoulder Internal Rotation 3/5  previous: same   Left Shoulder External Rotation 3-/5  previous: same                  OT Treatments/Exercises (OP) - 04/18/17 1135      Exercises   Exercises Shoulder     Shoulder Exercises: Supine   Protraction PROM;AAROM;10 reps   Horizontal ABduction PROM;AAROM;10 reps   External Rotation PROM;AAROM;10 reps   Internal Rotation PROM;AAROM;10 reps   Flexion PROM;AAROM;10 reps   ABduction PROM;AAROM;10  reps     Shoulder Exercises: Standing   Protraction AAROM;10 reps   Horizontal ABduction AAROM;10 reps   External Rotation AAROM;10 reps   Internal Rotation AAROM;10 reps   Flexion AAROM;10 reps   ABduction AAROM;10 reps                OT Education - 04/18/17 1147    Education provided Yes   Education Details AA/ROM shoulder exercises.   Person(s) Educated Patient   Methods Explanation;Demonstration;Verbal cues;Handout   Comprehension Returned demonstration;Verbalized understanding          OT Short Term Goals - 04/11/17 1344      OT SHORT TERM GOAL #1   Title Patient will be educated and independent with HEP to increase functional use of LUE during daily tasks.     Time 3   Period Weeks   Status On-going     OT SHORT TERM GOAL #2   Title Patient will increase P/ROM to [facility] to increase ability to get shirts on and off with less difficulty.   Time 3   Period Weeks   Status On-going     OT SHORT TERM GOAL #3   Title Patient will increase LUE strength to 3/5 to increase functional reaching ability to increase ability to brush hair.   Time 3   Period Weeks   Status On-going     OT SHORT TERM GOAL #4   Title Patient will decrease pain level in LUE to 5/10 during light household tasks.    Time 3   Period Weeks   Status On-going     OT SHORT TERM GOAL #5   Title patient will decrease fascial restrictions to mod amount to increase ability to complete tasks at shoulder with less difficulty.   Time 3   Period Weeks   Status On-going           OT Long Term Goals - 04/11/17 1344      OT LONG TERM GOAL #1   Title Patient will return to highest level of independence when using LUE for all daily and work related tasks.    Time 6   Period Weeks   Status On-going     OT LONG TERM GOAL #2   Title Patient will decrease fascial restrictions to min amount or less in order to increase functional reaching ability.   Time 6   Period Weeks   Status On-going     OT LONG TERM GOAL #3   Title Patient will decrease pain level in LUE during work activities to a 3/10 or less.   Time 6   Period Weeks   Status On-going     OT LONG TERM GOAL #4   Title Patient will increase A/ROM to [facility] to increase ability to hook and unhook bra.   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   Time 6   Period Weeks   Status On-going               Plan - 04/18/17 1230    Clinical Impression Statement A:Progressed to AA/ROM supine and standing while adding it to HEP. Measurements were taken for MD appointment. Patient is making  progress with therapy goals in just a short amount of time. Will continue to work on increase ROM and  strength in order to complete functional tasks with less difficulty.    Plan P: Follow up on HEP update and MD appointment. Add wall wash and continue to work on increasing ROM and shoulder strength while decreasing pain level.      Patient will benefit from skilled therapeutic intervention in order to improve the following deficits and impairments:  Pain, Decreased range of motion, Decreased strength, Increased fascial restricitons, Impaired UE functional use  Visit Diagnosis: Acute pain of left shoulder  Other symptoms and signs involving the musculoskeletal system  Stiffness of left shoulder, not elsewhere classified    Problem List Patient Active Problem List   Diagnosis Date Noted  . Dyspepsia and disorder of function of stomach   . Constipation 02/15/2017  . Dysphagia 02/15/2017  . Encounter for screening colonoscopy 02/15/2017  . Arthritis of knee, degenerative 03/09/2014  . Knee pain 08/21/2011  . Stiffness of joint, not elsewhere classified, lower leg 08/21/2011  . Medial meniscus, posterior horn derangement 08/02/2011  . Torn meniscus 07/19/2011  . HIP PAIN 02/16/2009   Ailene Ravel, OTR/L,CBIS  [phone]  04/18/2017, 12:34 PM  [facility] [address] [place], [place], [address] Phone: [phone]   Fax:  [phone]  Name: Carla Wise MRN: [id number] Date of Birth: Jun 03, 1966

## 2017-04-18 NOTE — Patient Instructions (Signed)

## 2017-04-19 ENCOUNTER — Encounter: Payer: Self-pay | Admitting: Orthopedic Surgery

## 2017-04-19 ENCOUNTER — Ambulatory Visit (INDEPENDENT_AMBULATORY_CARE_PROVIDER_SITE_OTHER): Payer: BLUE CROSS/BLUE SHIELD | Admitting: Orthopedic Surgery

## 2017-04-19 DIAGNOSIS — M7502 Adhesive capsulitis of left shoulder: Secondary | ICD-10-CM | POA: Diagnosis not present

## 2017-04-19 MED ORDER — DICLOFENAC POTASSIUM 50 MG PO TABS: 50.0000 mg | ORAL_TABLET | Freq: Two times a day (BID) | ORAL | 3 refills | Status: DC

## 2017-04-19 MED ORDER — HYDROCODONE-ACETAMINOPHEN 5-325 MG PO TABS: 1.0000 | ORAL_TABLET | Freq: Four times a day (QID) | ORAL | 0 refills | Status: DC | PRN

## 2017-04-19 NOTE — Progress Notes (Signed)
Patient ID: Carla Wise, female   DOB: June 18, 1966, 51 y.o.   MRN: [id number]  Chief Complaint  Patient presents with  . Follow-up    left shoulder s/p therapy    51 year old female with adhesive capsulitis she has made some improvements in physical therapy her pain is intermittent worse at night and comes and goes depending on resolution movement    Review of Systems  Neurological: Negative for tingling.      There were no vitals taken for this visit. Gen. appearance is normal grooming and hygiene Orientation to person place and time normal Mood normal Gait is normal  No peripheral edema or swelling is noted in the Left upper extremity Sensory exam shows normal sensation to palpation, pressure and soft touch Skin exam no lacerations ulcerations or erythema  Ortho Exam   A/P  Medical decision-making  Therapy notes  PROM   Overall PROM Comments Assessed supine. IR/er adducted   PROM Assessment Site Shoulder   Right/Left Shoulder Left   Left Shoulder Flexion 112 Degrees  previous: 95   Left Shoulder ABduction 90 Degrees  previous: 65   Left Shoulder Internal Rotation 90 Degrees  previous: same   Left Shoulder External Rotation 25 Degrees  previous: 20  Meds ordered this encounter  Medications  . HYDROcodone-acetaminophen (NORCO) 5-325 MG tablet    Sig: Take 1 tablet by mouth every 6 (six) hours as needed for moderate pain.    Dispense:  30 tablet    Refill:  0  . diclofenac (CATAFLAM) 50 MG tablet    Sig: Take 1 tablet (50 mg total) by mouth 2 (two) times daily.    Dispense:  90 tablet    Refill:  3    Follow-up in 2 weeks, NO work 2 weeks.  [name], MD 04/19/2017 9:27 AM

## 2017-04-19 NOTE — Patient Instructions (Signed)
Meds ordered this encounter  Medications  . HYDROcodone-acetaminophen (NORCO) 5-325 MG tablet    Sig: Take 1 tablet by mouth every 6 (six) hours as needed for moderate pain.    Dispense:  30 tablet    Refill:  0  . diclofenac (CATAFLAM) 50 MG tablet    Sig: Take 1 tablet (50 mg total) by mouth 2 (two) times daily.    Dispense:  90 tablet    Refill:  3   Continue PT / OT   OOW X 2 WEEKS

## 2017-04-22 ENCOUNTER — Encounter (HOSPITAL_COMMUNITY): Payer: Self-pay

## 2017-04-22 ENCOUNTER — Ambulatory Visit (HOSPITAL_COMMUNITY): Payer: BLUE CROSS/BLUE SHIELD

## 2017-04-22 DIAGNOSIS — R29898 Other symptoms and signs involving the musculoskeletal system: Secondary | ICD-10-CM

## 2017-04-22 DIAGNOSIS — M25512 Pain in left shoulder: Secondary | ICD-10-CM

## 2017-04-22 DIAGNOSIS — M25612 Stiffness of left shoulder, not elsewhere classified: Secondary | ICD-10-CM

## 2017-04-22 NOTE — Therapy (Signed)
Carla Wise, [place], [address] Phone: [phone]   Fax:  [phone]  Occupational Therapy Treatment  Patient Details  Name: OTA EBERSOLE MRN: [id number] Date of Birth: Mar 06, 1966 Referring Provider: [name], MD  Encounter Date: 04/22/2017      OT End of Session - 04/22/17 1222    Visit Number 5   Number of Visits 12   Date for OT Re-Evaluation 05/17/17   Authorization Type BCBS    Authorization Time Period 30 visit limit with 0 used this year.   Authorization - Visit Number 5   Authorization - Number of Visits 30   OT Start Time 1115   OT Stop Time 1200   OT Time Calculation (min) 45 min   Activity Tolerance Patient tolerated treatment well   Behavior During Therapy WFL for tasks assessed/performed      Past Medical History:  Diagnosis Date  . Constipation   . Gastric adenoma 02/2017  . PONV (postoperative nausea and vomiting)     Past Surgical History:  Procedure Laterality Date  . ESOPHAGOGASTRODUODENOSCOPY N/A 03/01/2017   Procedure: ESOPHAGOGASTRODUODENOSCOPY (EGD);  Surgeon: Danie Binder, MD;  [facility];  Service: Endoscopy;  Laterality: N/A;  . FOOT SURGERY  2003  . KNEE ARTHROSCOPY W/ MENISCECTOMY     left knee  . SAVORY DILATION N/A 03/01/2017   Procedure: SAVORY DILATION;  Surgeon: Danie Binder, MD;  [facility];  Service: Endoscopy;  Laterality: N/A;    There were no vitals filed for this visit.          [facility] OT Assessment - 04/22/17 1117      Assessment   Diagnosis Left shoulder adhesive capsulitis     Precautions   Precautions None                  OT Treatments/Exercises (OP) - 04/22/17 1117      Exercises   Exercises Shoulder     Shoulder Exercises: Supine   Protraction PROM;5 reps;AAROM;12 reps   Horizontal ABduction PROM;5 reps;AAROM;12 reps   External Rotation PROM;5 reps;AAROM;12 reps   Internal Rotation  PROM;5 reps;AAROM;12 reps   Flexion PROM;5 reps;AAROM;12 reps   ABduction PROM;5 reps;AAROM;12 reps     Shoulder Exercises: Standing   Protraction AAROM;12 reps   Horizontal ABduction AAROM;12 reps   External Rotation AAROM;12 reps   Internal Rotation AAROM;12 reps   Flexion AAROM;12 reps   ABduction AAROM;12 reps     Shoulder Exercises: Pulleys   Flexion 1 minute   ABduction 1 minute     Shoulder Exercises: ROM/Strengthening   Wall Wash 1'   Proximal Shoulder Strengthening, Supine 10X 2 rest breaks   Other ROM/Strengthening Exercises PVC pipe slide; flexion and abduction. 10X each     Manual Therapy   Manual Therapy Myofascial release;Muscle Energy Technique   Manual therapy comments manual therapy completed prior to exercises.    Myofascial Release Myofascial release and manual stretching completed to left upper arm, trapezius, and scapular region to decrease fascial restrictions and increase joint mobility in a pain free zone.    Muscle Energy Technique Muscle energy technique completed to left anterior deltoid to relax tone and improve range of motion.                   OT Short Term Goals - 04/11/17 1344      OT SHORT TERM GOAL #1   Title Patient will  be educated and independent with HEP to increase functional use of LUE during daily tasks.    Time 3   Period Weeks   Status On-going     OT SHORT TERM GOAL #2   Title Patient will increase P/ROM to [facility] to increase ability to get shirts on and off with less difficulty.   Time 3   Period Weeks   Status On-going     OT SHORT TERM GOAL #3   Title Patient will increase LUE strength to 3/5 to increase functional reaching ability to increase ability to brush hair.   Time 3   Period Weeks   Status On-going     OT SHORT TERM GOAL #4   Title Patient will decrease pain level in LUE to 5/10 during light household tasks.    Time 3   Period Weeks   Status On-going     OT SHORT TERM GOAL #5   Title patient will  decrease fascial restrictions to mod amount to increase ability to complete tasks at shoulder with less difficulty.   Time 3   Period Weeks   Status On-going           OT Long Term Goals - 04/11/17 1344      OT LONG TERM GOAL #1   Title Patient will return to highest level of independence when using LUE for all daily and work related tasks.    Time 6   Period Weeks   Status On-going     OT LONG TERM GOAL #2   Title Patient will decrease fascial restrictions to min amount or less in order to increase functional reaching ability.   Time 6   Period Weeks   Status On-going     OT LONG TERM GOAL #3   Title Patient will decrease pain level in LUE during work activities to a 3/10 or less.   Time 6   Period Weeks   Status On-going     OT LONG TERM GOAL #4   Title Patient will increase A/ROM to [facility] to increase ability to hook and unhook bra.   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   Time 6   Period Weeks   Status On-going               Plan - 04/22/17 1222    Clinical Impression Statement A: Added muscle energy technique to increase P/ROM. Patient was able to achieve a little more ROM. Increased repetitions with AA/ROM to 12. VC for form and technique as needed.    Plan P: Continue to work on increasing ROM and shoulder strength while decreasing pain level. Add shoulder stretches.      Patient will benefit from skilled therapeutic intervention in order to improve the following deficits and impairments:  Pain, Decreased range of motion, Decreased strength, Increased fascial restricitons, Impaired UE functional use  Visit Diagnosis: Acute pain of left shoulder  Other symptoms and signs involving the musculoskeletal system  Stiffness of left shoulder, not elsewhere classified    Problem List Patient Active Problem List    Diagnosis Date Noted  . Dyspepsia and disorder of function of stomach   . Constipation 02/15/2017  . Dysphagia 02/15/2017  . Encounter for screening colonoscopy 02/15/2017  . Arthritis of knee, degenerative 03/09/2014  .  Knee pain 08/21/2011  . Stiffness of joint, not elsewhere classified, lower leg 08/21/2011  . Medial meniscus, posterior horn derangement 08/02/2011  . Torn meniscus 07/19/2011  . HIP PAIN 02/16/2009   Ailene Ravel, OTR/L,CBIS  [phone]  04/22/2017, 12:24 PM  [facility] [address] [place], [place], [address] Phone: [phone]   Fax:  [phone]  Name: DAJANEE VOORHEIS MRN: [id number] Date of Birth: [birth year]

## 2017-04-25 ENCOUNTER — Encounter (HOSPITAL_COMMUNITY): Payer: Self-pay

## 2017-04-25 ENCOUNTER — Ambulatory Visit (HOSPITAL_COMMUNITY): Payer: BLUE CROSS/BLUE SHIELD

## 2017-04-25 DIAGNOSIS — M25612 Stiffness of left shoulder, not elsewhere classified: Secondary | ICD-10-CM

## 2017-04-25 DIAGNOSIS — M25512 Pain in left shoulder: Secondary | ICD-10-CM | POA: Diagnosis not present

## 2017-04-25 DIAGNOSIS — R29898 Other symptoms and signs involving the musculoskeletal system: Secondary | ICD-10-CM

## 2017-04-26 NOTE — Therapy (Addendum)
[place], [place], [address] Phone: [phone]   Fax:  [phone]  Occupational Therapy Treatment  Patient Details  Name: Carla Wise MRN: [id number] Date of Birth: 1966-03-20 Referring Provider: [name], MD  Encounter Date: 04/25/2017      OT End of Session - 04/26/17 1100    Visit Number 6   Number of Visits 12   Date for OT Re-Evaluation 05/17/17   Authorization Type BCBS    Authorization Time Period 30 visit limit with 0 used this year.   Authorization - Visit Number 6   Authorization - Number of Visits 30   OT Start Time 1302   OT Stop Time 1345   OT Time Calculation (min) 43 min   Activity Tolerance Patient tolerated treatment well   Behavior During Therapy WFL for tasks assessed/performed      Past Medical History:  Diagnosis Date  . Constipation   . Gastric adenoma 02/2017  . PONV (postoperative nausea and vomiting)     Past Surgical History:  Procedure Laterality Date  . ESOPHAGOGASTRODUODENOSCOPY N/A 03/01/2017   Procedure: ESOPHAGOGASTRODUODENOSCOPY (EGD);  Surgeon: Danie Binder, MD;  [facility];  Service: Endoscopy;  Laterality: N/A;  . FOOT SURGERY  2003  . KNEE ARTHROSCOPY W/ MENISCECTOMY     left knee  . SAVORY DILATION N/A 03/01/2017   Procedure: SAVORY DILATION;  Surgeon: Danie Binder, MD;  [facility];  Service: Endoscopy;  Laterality: N/A;    There were no vitals filed for this visit.      Subjective Assessment - 04/25/17 1559    Subjective  S: I had another bad night. This is just horrible.   Currently in Pain? Yes   Pain Score 7    Pain Location Shoulder   Pain Orientation Left   Pain Descriptors / Indicators Constant;Sharp   Pain Type Acute pain   Pain Radiating Towards N/A   Pain Onset More than a month ago   Pain Frequency Constant   Aggravating Factors  using it and at night it's worse   Pain Relieving Factors Nothing   Effect of Pain on Daily Activities severe effect   Multiple Pain Sites No          04/25/17 0001  Exercises  Exercises Shoulder  Shoulder Exercises: Supine  Protraction PROM;5 reps;AAROM;12 reps  Horizontal ABduction PROM;5 reps;AAROM;12 reps  External Rotation PROM;5 reps;AAROM;12 reps  Internal Rotation PROM;5 reps;AAROM;12 reps  Flexion PROM;5 reps;AAROM;12 reps  ABduction PROM;5 reps;AAROM;12 reps  Shoulder Exercises: ROM/Strengthening  Other ROM/Strengthening Exercises PVC pipe slide; flexion and abduction. 10X each  Modalities  Modalities Electrical Stimulation;Moist Heat  Moist Heat Therapy  Number Minutes Moist Heat 10 Minutes  Moist Heat Location Shoulder  Electrical Stimulation  Electrical Stimulation Location left shoulder  Electrical Stimulation Action interferential  Electrical Stimulation Parameters 14.5 CV  Electrical Stimulation Goals Pain                           OT Short Term Goals - 04/11/17 1344      OT SHORT TERM GOAL #1   Title Patient will be educated and independent with HEP to increase functional use of LUE during daily tasks.    Time 3   Period Weeks   Status On-going     OT SHORT TERM GOAL #2   Title Patient will increase P/ROM to [facility] to increase ability  to get shirts on and off with less difficulty.   Time 3   Period Weeks   Status On-going     OT SHORT TERM GOAL #3   Title Patient will increase LUE strength to 3/5 to increase functional reaching ability to increase ability to brush hair.   Time 3   Period Weeks   Status On-going     OT SHORT TERM GOAL #4   Title Patient will decrease pain level in LUE to 5/10 during light household tasks.    Time 3   Period Weeks   Status On-going     OT SHORT TERM GOAL #5   Title patient will decrease fascial restrictions to mod amount to increase ability to complete tasks at shoulder with less difficulty.   Time 3   Period Weeks   Status On-going           OT Long  Term Goals - 04/11/17 1344      OT LONG TERM GOAL #1   Title Patient will return to highest level of independence when using LUE for all daily and work related tasks.    Time 6   Period Weeks   Status On-going     OT LONG TERM GOAL #2   Title Patient will decrease fascial restrictions to min amount or less in order to increase functional reaching ability.   Time 6   Period Weeks   Status On-going     OT LONG TERM GOAL #3   Title Patient will decrease pain level in LUE during work activities to a 3/10 or less.   Time 6   Period Weeks   Status On-going     OT LONG TERM GOAL #4   Title Patient will increase A/ROM to [facility] to increase ability to hook and unhook bra.   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   Time 6   Period Weeks   Status On-going               Plan - 04/26/17 1105    Clinical Impression Statement A: pt reports increased pain at beginning of session. ES and moist heat utilized for pain control and patient expressed that her pain decreased to 6/10. Shoulder stretches not completed this session due to pain and time constraint.    Plan P: Resume missed exercises. Add shoulder stretches.       Patient will benefit from skilled therapeutic intervention in order to improve the following deficits and impairments:  Pain, Decreased range of motion, Decreased strength, Increased fascial restricitons, Impaired UE functional use  Visit Diagnosis: Acute pain of left shoulder  Other symptoms and signs involving the musculoskeletal system  Stiffness of left shoulder, not elsewhere classified    Problem List Patient Active Problem List   Diagnosis Date Noted  . Dyspepsia and disorder of function of stomach   . Constipation 02/15/2017  . Dysphagia 02/15/2017  . Encounter for screening colonoscopy 02/15/2017  . Arthritis of  knee, degenerative 03/09/2014  . Knee pain 08/21/2011  . Stiffness of joint, not elsewhere classified, lower leg 08/21/2011  . Medial meniscus, posterior horn derangement 08/02/2011  . Torn meniscus 07/19/2011  . HIP PAIN 02/16/2009   Ailene Ravel, OTR/L,CBIS  [phone]  04/26/2017, 11:07 AM  [facility]  [place], [place], [address] Phone: [phone]   Fax:  [phone]  Name: Carla Wise MRN: [id number] Date of Birth: Mar 20, 1966

## 2017-04-29 ENCOUNTER — Ambulatory Visit (HOSPITAL_COMMUNITY): Payer: BLUE CROSS/BLUE SHIELD

## 2017-04-29 ENCOUNTER — Encounter (HOSPITAL_COMMUNITY): Payer: Self-pay

## 2017-04-29 DIAGNOSIS — M25612 Stiffness of left shoulder, not elsewhere classified: Secondary | ICD-10-CM | POA: Diagnosis not present

## 2017-04-29 DIAGNOSIS — M25512 Pain in left shoulder: Secondary | ICD-10-CM

## 2017-04-29 DIAGNOSIS — R29898 Other symptoms and signs involving the musculoskeletal system: Secondary | ICD-10-CM | POA: Diagnosis not present

## 2017-04-29 NOTE — Patient Instructions (Signed)
   The following stretches hold for 15 seconds and gradually increase to 45 seconds to a minute. Complete 3 times.  Towel Internal Rotation Stretch  Hold a towel in both hands. Bring one hand behind your body and with the other, reach behind your neck and use the towel to gently pull the other hand behind the back until you feel a stretch in the shoulder.     Posterior Capsule Stretch   Stand or sit, one arm across body so hand rests over opposite shoulder. Gently push on crossed elbow with other hand until stretch is felt in shoulder of crossed arm.   Wall Flexion  slide your arm up the wall until a stretch is felt in your shoulder .

## 2017-04-29 NOTE — Therapy (Signed)
Carla Wise, [place], [address] Phone: [phone]   Fax:  [phone]  Occupational Therapy Treatment  Patient Details  Name: Carla Wise MRN: [id number] Date of Birth: [birth year] Referring Provider: [name], MD  Encounter Date: 04/29/2017      OT End of Session - 04/29/17 1351    Visit Number 7   Number of Visits 12   Date for OT Re-Evaluation 05/17/17   Authorization Type BCBS    Authorization Time Period 30 visit limit with 0 used this year.   Authorization - Visit Number 7   Authorization - Number of Visits 30   OT Start Time 7106   OT Stop Time 1204   OT Time Calculation (min) 46 min   Activity Tolerance Patient tolerated treatment well   Behavior During Therapy WFL for tasks assessed/performed      Past Medical History:  Diagnosis Date  . Constipation   . Gastric adenoma 02/2017  . PONV (postoperative nausea and vomiting)     Past Surgical History:  Procedure Laterality Date  . ESOPHAGOGASTRODUODENOSCOPY N/A 03/01/2017   Procedure: ESOPHAGOGASTRODUODENOSCOPY (EGD);  Surgeon: Danie Binder, MD;  [facility];  Service: Endoscopy;  Laterality: N/A;  . FOOT SURGERY  2003  . KNEE ARTHROSCOPY W/ MENISCECTOMY     left knee  . SAVORY DILATION N/A 03/01/2017   Procedure: SAVORY DILATION;  Surgeon: Danie Binder, MD;  [facility];  Service: Endoscopy;  Laterality: N/A;    There were no vitals filed for this visit.      Subjective Assessment - 04/29/17 1118    Currently in Pain? Yes   Pain Score 6    Pain Location Shoulder   Pain Orientation Left   Pain Descriptors / Indicators Constant;Aching   Pain Type Acute pain            [facility] OT Assessment - 04/29/17 1348      Assessment   Diagnosis Left shoulder adhesive capsulitis     Precautions   Precautions None                  OT Treatments/Exercises (OP) - 04/29/17 1348      Exercises    Exercises Shoulder     Shoulder Exercises: Supine   Protraction PROM;5 reps;AAROM;12 reps   Horizontal ABduction PROM;5 reps;AAROM;12 reps   External Rotation PROM;5 reps;AAROM;12 reps   Internal Rotation PROM;5 reps;AAROM;12 reps   Flexion PROM;5 reps;AAROM;12 reps   ABduction PROM;5 reps;AAROM;12 reps     Shoulder Exercises: Stretch   Cross Chest Stretch 3 reps  15"   Internal Rotation Stretch 3 reps  15"   Wall Stretch - Flexion 3 reps  15"     Modalities   Modalities --  Laser; left anterior deltoid; 2 trials; 1'24" 60.0 joules     Manual Therapy   Manual Therapy Myofascial release;Muscle Energy Technique   Manual therapy comments manual therapy completed prior to exercises.    Myofascial Release Myofascial release and manual stretching completed to left upper arm, trapezius, and scapular region to decrease fascial restrictions and increase joint mobility in a pain free zone.    Muscle Energy Technique Muscle energy technique completed to left anterior deltoid to relax tone and improve range of motion.                 OT Education - 04/29/17 1350    Education provided Yes   Education  Details shoulder stretches   Person(s) Educated Patient   Methods Explanation;Demonstration;Tactile cues;Verbal cues;Handout   Comprehension Verbalized understanding;Returned demonstration          OT Short Term Goals - 04/11/17 1344      OT SHORT TERM GOAL #1   Title Patient will be educated and independent with HEP to increase functional use of LUE during daily tasks.    Time 3   Period Weeks   Status On-going     OT SHORT TERM GOAL #2   Title Patient will increase P/ROM to [facility] to increase ability to get shirts on and off with less difficulty.   Time 3   Period Weeks   Status On-going     OT SHORT TERM GOAL #3   Title Patient will increase LUE strength to 3/5 to increase functional reaching ability to increase ability to brush hair.   Time 3   Period Weeks    Status On-going     OT SHORT TERM GOAL #4   Title Patient will decrease pain level in LUE to 5/10 during light household tasks.    Time 3   Period Weeks   Status On-going     OT SHORT TERM GOAL #5   Title patient will decrease fascial restrictions to mod amount to increase ability to complete tasks at shoulder with less difficulty.   Time 3   Period Weeks   Status On-going           OT Long Term Goals - 04/11/17 1344      OT LONG TERM GOAL #1   Title Patient will return to highest level of independence when using LUE for all daily and work related tasks.    Time 6   Period Weeks   Status On-going     OT LONG TERM GOAL #2   Title Patient will decrease fascial restrictions to min amount or less in order to increase functional reaching ability.   Time 6   Period Weeks   Status On-going     OT LONG TERM GOAL #3   Title Patient will decrease pain level in LUE during work activities to a 3/10 or less.   Time 6   Period Weeks   Status On-going     OT LONG TERM GOAL #4   Title Patient will increase A/ROM to [facility] to increase ability to hook and unhook bra.   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   Time 6   Period Weeks   Status On-going               Plan - 04/29/17 1351    Clinical Impression Statement A: Added laser for pain management. patient continues to be limited with pain which cases her to be unable to tolerate intensive stretching. Pt completed shoulder stretches this session and they were added to HEP.   Plan P: Instead of supine exercises complete them standing. Measure for MD appointment. Follow up on HEP.      Patient will benefit from skilled therapeutic intervention in order to improve the following deficits and impairments:  Pain, Decreased range of motion, Decreased strength, Increased fascial restricitons,  Impaired UE functional use  Visit Diagnosis: Acute pain of left shoulder  Other symptoms and signs involving the musculoskeletal system  Stiffness  of left shoulder, not elsewhere classified    Problem List Patient Active Problem List   Diagnosis Date Noted  . Dyspepsia and disorder of function of stomach   . Constipation 02/15/2017  . Dysphagia 02/15/2017  . Encounter for screening colonoscopy 02/15/2017  . Arthritis of knee, degenerative 03/09/2014  . Knee pain 08/21/2011  . Stiffness of joint, not elsewhere classified, lower leg 08/21/2011  . Medial meniscus, posterior horn derangement 08/02/2011  . Torn meniscus 07/19/2011  . HIP PAIN 02/16/2009   Ailene Ravel, OTR/L,CBIS  [phone]  04/29/2017, 1:55 PM  [facility] [address] [place], [place], [address] Phone: [phone]   Fax:  [phone]  Name: MELONEE GERSTEL MRN: [id number] Date of Birth: 08-10-1966

## 2017-05-02 ENCOUNTER — Ambulatory Visit (HOSPITAL_COMMUNITY): Payer: BLUE CROSS/BLUE SHIELD

## 2017-05-02 ENCOUNTER — Encounter (HOSPITAL_COMMUNITY): Payer: Self-pay

## 2017-05-02 DIAGNOSIS — M25512 Pain in left shoulder: Secondary | ICD-10-CM | POA: Diagnosis not present

## 2017-05-02 DIAGNOSIS — M25612 Stiffness of left shoulder, not elsewhere classified: Secondary | ICD-10-CM

## 2017-05-02 DIAGNOSIS — R29898 Other symptoms and signs involving the musculoskeletal system: Secondary | ICD-10-CM | POA: Diagnosis not present

## 2017-05-02 NOTE — Therapy (Signed)
[place], [place], [address] Phone: [phone]   Fax:  [phone]  Occupational Therapy Treatment  Patient Details  Name: Carla Wise MRN: [id number] Date of Birth: [birth year] Referring Provider: [name], MD  Encounter Date: 05/02/2017      OT End of Session - 05/02/17 1335    Visit Number 8   Number of Visits 12   Date for OT Re-Evaluation 05/17/17   Authorization Type BCBS    Authorization Time Period 30 visit limit with 0 used this year.   Authorization - Visit Number 8   Authorization - Number of Visits 30   OT Start Time 1120   OT Stop Time 1200   OT Time Calculation (min) 40 min   Activity Tolerance Patient limited by pain   Behavior During Therapy [facility] for tasks assessed/performed      Past Medical History:  Diagnosis Date  . Constipation   . Gastric adenoma 02/2017  . PONV (postoperative nausea and vomiting)     Past Surgical History:  Procedure Laterality Date  . ESOPHAGOGASTRODUODENOSCOPY N/A 03/01/2017   Procedure: ESOPHAGOGASTRODUODENOSCOPY (EGD);  Surgeon: Danie Binder, MD;  [facility];  Service: Endoscopy;  Laterality: N/A;  . FOOT SURGERY  2003  . KNEE ARTHROSCOPY W/ MENISCECTOMY     left knee  . SAVORY DILATION N/A 03/01/2017   Procedure: SAVORY DILATION;  Surgeon: Danie Binder, MD;  [facility];  Service: Endoscopy;  Laterality: N/A;    There were no vitals filed for this visit.      Subjective Assessment - 05/02/17 1332    Subjective  S: I just want to get back to work but with this pain i just can't.   Currently in Pain? Yes   Pain Score 6    Pain Location Shoulder   Pain Orientation Left   Pain Descriptors / Indicators Aching;Constant   Pain Type Acute pain   Pain Onset More than a month ago   Pain Frequency Constant   Aggravating Factors  using it and at night it's worse   Pain Relieving Factors Nothing   Effect of Pain on  Daily Activities severe effect   Multiple Pain Sites No            [facility] OT Assessment - 05/02/17 1134      Assessment   Diagnosis Left shoulder adhesive capsulitis     Precautions   Precautions None     AROM   Overall AROM Comments assessed seated. IR/er adducted.    AROM Assessment Site Shoulder   Right/Left Shoulder Left   Left Shoulder Flexion 122 Degrees  previous 101   Left Shoulder ABduction 108 Degrees  previous: 97   Left Shoulder Internal Rotation 90 Degrees  previous: same   Left Shoulder External Rotation 46 Degrees  previous: 28     PROM   Overall PROM Comments Assessed supine. IR/er adducted   PROM Assessment Site Shoulder   Right/Left Shoulder Left   Left Shoulder Flexion 127 Degrees  previous 112   Left Shoulder ABduction 119 Degrees  previous 90   Left Shoulder Internal Rotation 90 Degrees  previous 90   Left Shoulder External Rotation 50 Degrees  previous 25     Strength   Overall Strength Comments Assessed seated. IR/er adducted   Strength Assessment Site Shoulder   Right/Left Shoulder Left   Left Shoulder Flexion 3-/5  previous: same   Left Shoulder ABduction  3-/5  previous: same   Left Shoulder Internal Rotation 4-/5  previous: 3/5   Left Shoulder External Rotation 4-/5  previous: 3-/5                  OT Treatments/Exercises (OP) - 05/02/17 1539      Exercises   Exercises Shoulder     Shoulder Exercises: Supine   Protraction PROM;5 reps   Horizontal ABduction PROM;5 reps   External Rotation PROM;5 reps   Internal Rotation PROM;5 reps   Flexion PROM;5 reps   ABduction PROM;5 reps     Modalities   Modalities Ultrasound     Ultrasound   Ultrasound Location left upper arm   Ultrasound Parameters 1.5 W/cm2 5 minutes   Ultrasound Goals Pain     Manual Therapy   Manual Therapy Myofascial release;Muscle Energy Technique   Manual therapy comments manual therapy completed prior to exercises.    Myofascial Release  Myofascial release and manual stretching completed to left upper arm, trapezius, and scapular region to decrease fascial restrictions and increase joint mobility in a pain free zone.    Muscle Energy Technique Muscle energy technique completed to left anterior deltoid to relax tone and improve range of motion.                 OT Education - 05/02/17 1334    Education provided Yes   Education Details diagnosis of adhesive capsilitus; typical timeframe, treatment, plan; reassurance with gains of ROM    Person(s) Educated Patient   Methods Explanation;Verbal cues   Comprehension Verbalized understanding;Returned demonstration          OT Short Term Goals - 04/11/17 1344      OT SHORT TERM GOAL #1   Title Patient will be educated and independent with HEP to increase functional use of LUE during daily tasks.    Time 3   Period Weeks   Status On-going     OT SHORT TERM GOAL #2   Title Patient will increase P/ROM to [facility] to increase ability to get shirts on and off with less difficulty.   Time 3   Period Weeks   Status On-going     OT SHORT TERM GOAL #3   Title Patient will increase LUE strength to 3/5 to increase functional reaching ability to increase ability to brush hair.   Time 3   Period Weeks   Status On-going     OT SHORT TERM GOAL #4   Title Patient will decrease pain level in LUE to 5/10 during light household tasks.    Time 3   Period Weeks   Status On-going     OT SHORT TERM GOAL #5   Title patient will decrease fascial restrictions to mod amount to increase ability to complete tasks at shoulder with less difficulty.   Time 3   Period Weeks   Status On-going           OT Long Term Goals - 04/11/17 1344      OT LONG TERM GOAL #1   Title Patient will return to highest level of independence when using LUE for all daily and work related tasks.    Time 6   Period Weeks   Status On-going     OT LONG TERM GOAL #2   Title Patient will decrease fascial  restrictions to min amount or less in order to increase functional reaching ability.   Time 6   Period Weeks   Status On-going  OT LONG TERM GOAL #3   Title Patient will decrease pain level in LUE during work activities to a 3/10 or less.   Time 6   Period Weeks   Status On-going     OT LONG TERM GOAL #4   Title Patient will increase A/ROM to [facility] to increase ability to hook and unhook bra.   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   Time 6   Period Weeks   Status On-going               Plan - 05/02/17 1335    Clinical Impression Statement A: Added ultrasound for pain management. Pt continues to be limited in ROM with pain which causes her to be unable to tolerate intensive stretching. Shoulder movement measurements taken today. Increased PROM and A/ROM was presented.    Rehab Potential Excellent   Plan P: Follow up on effect of ultrasound on shoulder pain. Instead of supine A/ROM go straight to standing A/ROM. Follow up on MD appointment.      Patient will benefit from skilled therapeutic intervention in order to improve the following deficits and impairments:  Pain, Decreased range of motion, Decreased strength, Increased fascial restricitons, Impaired UE functional use  Visit Diagnosis: Acute pain of left shoulder  Other symptoms and signs involving the musculoskeletal system  Stiffness of left shoulder, not elsewhere classified    Problem List Patient Active Problem List   Diagnosis Date Noted  . Dyspepsia and disorder of function of stomach   . Constipation 02/15/2017  . Dysphagia 02/15/2017  . Encounter for screening colonoscopy 02/15/2017  . Arthritis of knee, degenerative 03/09/2014  . Knee pain 08/21/2011  . Stiffness of joint, not elsewhere classified, lower leg 08/21/2011  . Medial meniscus, posterior horn  derangement 08/02/2011  . Torn meniscus 07/19/2011  . HIP PAIN 02/16/2009    Ailene Ravel, OTR/L,CBIS  [phone]  05/02/2017, 3:46 PM  [facility] [address] [place], [place], [address] Phone: [phone]   Fax:  [phone]  Name: REILEY BERTAGNOLLI MRN: [id number] Date of Birth: October 10, 1966

## 2017-05-03 ENCOUNTER — Encounter: Payer: Self-pay | Admitting: Orthopedic Surgery

## 2017-05-03 ENCOUNTER — Ambulatory Visit (INDEPENDENT_AMBULATORY_CARE_PROVIDER_SITE_OTHER): Payer: BLUE CROSS/BLUE SHIELD | Admitting: Orthopedic Surgery

## 2017-05-03 DIAGNOSIS — M7502 Adhesive capsulitis of left shoulder: Secondary | ICD-10-CM | POA: Diagnosis not present

## 2017-05-03 NOTE — Progress Notes (Signed)
AROM    Overall AROM Comments assessed seated. IR/er adducted.    AROM Assessment Site Shoulder   Right/Left Shoulder Left   Left Shoulder Flexion 122 Degrees  previous 101   Left Shoulder ABduction 108 Degrees  previous: 97   Left Shoulder Internal Rotation 90 Degrees  previous: same   Left Shoulder External Rotation 46 Degrees  previous: 28       PROM   Overall PROM Comments Assessed supine. IR/er adducted   PROM Assessment Site Shoulder   Right/Left Shoulder Left   Left Shoulder Flexion 127 Degrees  previous 112   Left Shoulder ABduction 119 Degrees  previous 90   Left Shoulder Internal Rotation 90 Degrees  previous 90   Left Shoulder External Rotation 50 Degrees  previous 25

## 2017-05-03 NOTE — Progress Notes (Signed)
Patient ID: Carla Wise, female   DOB: [birth year], 51 y.o.   MRN: [id number]  Chief Complaint  Patient presents with  . Follow-up    LEFT SHOULDER    51 year old female with history of adhesive capsulitis. She says that they can do therapy on her because she was in too much pain. I was able to go through some exercises with her did not seem to have any trouble getting her arm 220 of flexion and the scapular plane 30 of external rotation before she had discomfort and forward elevation and abduction to 90.  I ordered some new PT OT instructions for her and I'll see her again in 2-3 weeks    ROS  No new information  There were no vitals taken for this visit.  Ortho Exam  Please see notes as noted above A/P  Medical decision-making Occupational therapy notes noted below PROM   Overall PROM Comments Assessed supine. IR/er adducted   PROM Assessment Site Shoulder   Right/Left Shoulder Left   Left Shoulder Flexion 112 Degrees  previous: 95   Left Shoulder ABduction 90 Degrees  previous: 65   Left Shoulder Internal Rotation 90 Degrees  previous: same   Left Shoulder External Rotation 25 Degrees  previous: [place], MD 05/03/2017 10:15 AM

## 2017-05-07 ENCOUNTER — Ambulatory Visit (HOSPITAL_COMMUNITY): Payer: BLUE CROSS/BLUE SHIELD

## 2017-05-07 DIAGNOSIS — M25612 Stiffness of left shoulder, not elsewhere classified: Secondary | ICD-10-CM

## 2017-05-07 DIAGNOSIS — R29898 Other symptoms and signs involving the musculoskeletal system: Secondary | ICD-10-CM | POA: Diagnosis not present

## 2017-05-07 DIAGNOSIS — M25512 Pain in left shoulder: Secondary | ICD-10-CM | POA: Diagnosis not present

## 2017-05-07 NOTE — Therapy (Signed)
[place], [place], [address] Phone: [phone]   Fax:  [phone]  Occupational Therapy Treatment  Patient Details  Name: Carla Wise MRN: [id number] Date of Birth: Oct 14, 1966 Referring Provider: [name], MD  Encounter Date: 05/07/2017      OT End of Session - 05/07/17 1200    Visit Number 9   Number of Visits 12   Date for OT Re-Evaluation 05/17/17   Authorization Type BCBS    Authorization Time Period 30 visit limit with 0 used this year.   Authorization - Visit Number 9   Authorization - Number of Visits 30   OT Start Time 1120   OT Stop Time 1205   OT Time Calculation (min) 45 min   Activity Tolerance Patient limited by pain   Behavior During Therapy [facility] for tasks assessed/performed      Past Medical History:  Diagnosis Date  . Constipation   . Gastric adenoma 02/2017  . PONV (postoperative nausea and vomiting)     Past Surgical History:  Procedure Laterality Date  . ESOPHAGOGASTRODUODENOSCOPY N/A 03/01/2017   Procedure: ESOPHAGOGASTRODUODENOSCOPY (EGD);  Surgeon: Danie Binder, MD;  [facility];  Service: Endoscopy;  Laterality: N/A;  . FOOT SURGERY  2003  . KNEE ARTHROSCOPY W/ MENISCECTOMY     left knee  . SAVORY DILATION N/A 03/01/2017   Procedure: SAVORY DILATION;  Surgeon: Danie Binder, MD;  [facility];  Service: Endoscopy;  Laterality: N/A;    There were no vitals filed for this visit.      Subjective Assessment - 05/07/17 1200    Subjective  S: It was worse this morning but now it's back down to a 6/10.   Currently in Pain? Yes   Pain Score 6    Pain Location Shoulder   Pain Orientation Left   Pain Descriptors / Indicators Aching;Constant   Pain Type Acute pain                      OT Treatments/Exercises (OP) - 05/07/17 1134      Exercises   Exercises Shoulder     Shoulder Exercises: Standing   Protraction AAROM;12  reps   Horizontal ABduction AAROM;12 reps   External Rotation AAROM;12 reps   Internal Rotation AAROM;12 reps   Flexion AAROM;12 reps   ABduction AAROM;12 reps     Shoulder Exercises: ROM/Strengthening   Wall Wash 1'   Other ROM/Strengthening Exercises PVC pipe slide; 12X     Modalities   Modalities Moist Heat;Iontophoresis     Moist Heat Therapy   Number Minutes Moist Heat 10 Minutes   Moist Heat Location Shoulder     Iontophoresis   Type of Iontophoresis Dexamethasone   Location left deltoid   Dose 40 Dose 4.0 current 2cc   Time 10 minutes                  OT Short Term Goals - 04/11/17 1344      OT SHORT TERM GOAL #1   Title Patient will be educated and independent with HEP to increase functional use of LUE during daily tasks.    Time 3   Period Weeks   Status On-going     OT SHORT TERM GOAL #2   Title Patient will increase P/ROM to [facility] to increase ability to get shirts on and off with less difficulty.   Time 3   Period Weeks  Status On-going     OT SHORT TERM GOAL #3   Title Patient will increase LUE strength to 3/5 to increase functional reaching ability to increase ability to brush hair.   Time 3   Period Weeks   Status On-going     OT SHORT TERM GOAL #4   Title Patient will decrease pain level in LUE to 5/10 during light household tasks.    Time 3   Period Weeks   Status On-going     OT SHORT TERM GOAL #5   Title patient will decrease fascial restrictions to mod amount to increase ability to complete tasks at shoulder with less difficulty.   Time 3   Period Weeks   Status On-going           OT Long Term Goals - 04/11/17 1344      OT LONG TERM GOAL #1   Title Patient will return to highest level of independence when using LUE for all daily and work related tasks.    Time 6   Period Weeks   Status On-going     OT LONG TERM GOAL #2   Title Patient will decrease fascial restrictions to min amount or less in order to increase  functional reaching ability.   Time 6   Period Weeks   Status On-going     OT LONG TERM GOAL #3   Title Patient will decrease pain level in LUE during work activities to a 3/10 or less.   Time 6   Period Weeks   Status On-going     OT LONG TERM GOAL #4   Title Patient will increase A/ROM to [employer] to increase ability to hook and unhook bra.   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   Time 6   Period Weeks   Status On-going               Plan - 05/07/17 1201    Clinical Impression Statement A: Dr. Aline Brochure would like [place] to start session with heat followed by P/ROM to tolerance, and end with iontophoresis. Patient tolerated session well although was still limited by pain. Able to tolerate completing AA/ROM exercises better than Passive.    Occupational performance deficits (Please refer to evaluation for details): ADL's;IADL's;Rest and Sleep;Work;Leisure   OT Treatment/Interventions Therapeutic exercise;DME and/or AE instruction;Cryotherapy;Self-care/ADL training;Electrical Stimulation;Moist Heat;Ultrasound;Passive range of motion;Manual Therapy;Therapeutic activities;Patient/family education;Iontophoresis   Consulted and Agree with Plan of Care Patient   Plan P: Continue with heat at start of session, gentle P/ROM to tolerance, and end with intophoresis. Continue with shoulder stretches. Add overhead lacing.       Patient will benefit from skilled therapeutic intervention in order to improve the following deficits and impairments:  Pain, Decreased range of motion, Decreased strength, Increased fascial restricitons, Impaired UE functional use  Visit Diagnosis: Acute pain of left shoulder  Other symptoms and signs involving the musculoskeletal system  Stiffness of left shoulder, not elsewhere classified    Problem List Patient Active  Problem List   Diagnosis Date Noted  . Dyspepsia and disorder of function of stomach   . Constipation 02/15/2017  . Dysphagia 02/15/2017  . Encounter for screening colonoscopy 02/15/2017  . Arthritis of knee, degenerative 03/09/2014  . Knee pain 08/21/2011  . Stiffness of joint, not elsewhere  classified, lower leg 08/21/2011  . Medial meniscus, posterior horn derangement 08/02/2011  . Torn meniscus 07/19/2011  . HIP PAIN 02/16/2009   Ailene Ravel, OTR/L,CBIS  [phone]  05/07/2017, 12:05 PM  [facility] [address] [place], [place], [address] Phone: [phone]   Fax:  [phone]  Name: Carla Wise MRN: [id number] Date of Birth: 1966/04/30

## 2017-05-09 ENCOUNTER — Encounter (HOSPITAL_COMMUNITY): Payer: Self-pay

## 2017-05-09 ENCOUNTER — Ambulatory Visit (HOSPITAL_COMMUNITY): Payer: BLUE CROSS/BLUE SHIELD

## 2017-05-09 DIAGNOSIS — M25512 Pain in left shoulder: Secondary | ICD-10-CM

## 2017-05-09 DIAGNOSIS — R29898 Other symptoms and signs involving the musculoskeletal system: Secondary | ICD-10-CM | POA: Diagnosis not present

## 2017-05-09 DIAGNOSIS — M25612 Stiffness of left shoulder, not elsewhere classified: Secondary | ICD-10-CM

## 2017-05-09 NOTE — Therapy (Signed)
[place], [place], [address] Phone: [phone]   Fax:  [phone]  Occupational Therapy Treatment  Patient Details  Name: Carla Wise MRN: [id number] Date of Birth: [birth year] Referring Provider: [name], MD  Encounter Date: 05/09/2017      OT End of Session - 05/09/17 1143    Visit Number 10   Number of Visits 12   Date for OT Re-Evaluation 05/17/17   Authorization Type BCBS    Authorization Time Period 30 visit limit with 0 used this year.   Authorization - Visit Number 10   Authorization - Number of Visits 30   OT Start Time 1109   OT Stop Time 1200   OT Time Calculation (min) 51 min   Activity Tolerance Patient limited by pain   Behavior During Therapy [facility] for tasks assessed/performed      Past Medical History:  Diagnosis Date  . Constipation   . Gastric adenoma 02/2017  . PONV (postoperative nausea and vomiting)     Past Surgical History:  Procedure Laterality Date  . ESOPHAGOGASTRODUODENOSCOPY N/A 03/01/2017   Procedure: ESOPHAGOGASTRODUODENOSCOPY (EGD);  Surgeon: Danie Binder, MD;  [facility];  Service: Endoscopy;  Laterality: N/A;  . FOOT SURGERY  2003  . KNEE ARTHROSCOPY W/ MENISCECTOMY     left knee  . SAVORY DILATION N/A 03/01/2017   Procedure: SAVORY DILATION;  Surgeon: Danie Binder, MD;  [facility];  Service: Endoscopy;  Laterality: N/A;    There were no vitals filed for this visit.      Subjective Assessment - 05/09/17 1138    Subjective  S: Yesterday it swelled up a little bit. I don't know why.   Currently in Pain? Yes   Pain Score 6    Pain Location Shoulder   Pain Orientation Left   Pain Descriptors / Indicators Aching;Constant   Pain Type Acute pain   Pain Radiating Towards N/A   Pain Onset More than a month ago   Pain Frequency Constant   Aggravating Factors  using it.   Pain Relieving Factors iontophoresis   Effect of Pain  on Daily Activities severe effect   Multiple Pain Sites No            [facility] OT Assessment - 05/09/17 1140      Assessment   Diagnosis Left shoulder adhesive capsulitis     Precautions   Precautions None                  OT Treatments/Exercises (OP) - 05/09/17 1141      Exercises   Exercises Shoulder     Shoulder Exercises: Supine   Protraction PROM;5 reps   Horizontal ABduction PROM;5 reps   External Rotation PROM;5 reps   Internal Rotation PROM;5 reps   Flexion PROM;5 reps   ABduction PROM;5 reps     Shoulder Exercises: Pulleys   Flexion 1 minute   ABduction 1 minute     Shoulder Exercises: ROM/Strengthening   Wall Wash 1'   Over Head Lace 2'   Other ROM/Strengthening Exercises PVC pipe slide; 12X     Modalities   Modalities Moist Heat;Iontophoresis     Moist Heat Therapy   Number Minutes Moist Heat 10 Minutes   Moist Heat Location Shoulder     Iontophoresis   Type of Iontophoresis Dexamethasone   Location left deltoid   Dose 60 dose 4.0 current 2cc   Time 15 minutes  OT Short Term Goals - 04/11/17 1344      OT SHORT TERM GOAL #1   Title Patient will be educated and independent with HEP to increase functional use of LUE during daily tasks.    Time 3   Period Weeks   Status On-going     OT SHORT TERM GOAL #2   Title Patient will increase P/ROM to [facility] to increase ability to get shirts on and off with less difficulty.   Time 3   Period Weeks   Status On-going     OT SHORT TERM GOAL #3   Title Patient will increase LUE strength to 3/5 to increase functional reaching ability to increase ability to brush hair.   Time 3   Period Weeks   Status On-going     OT SHORT TERM GOAL #4   Title Patient will decrease pain level in LUE to 5/10 during light household tasks.    Time 3   Period Weeks   Status On-going     OT SHORT TERM GOAL #5   Title patient will decrease fascial restrictions to mod amount to increase  ability to complete tasks at shoulder with less difficulty.   Time 3   Period Weeks   Status On-going           OT Long Term Goals - 04/11/17 1344      OT LONG TERM GOAL #1   Title Patient will return to highest level of independence when using LUE for all daily and work related tasks.    Time 6   Period Weeks   Status On-going     OT LONG TERM GOAL #2   Title Patient will decrease fascial restrictions to min amount or less in order to increase functional reaching ability.   Time 6   Period Weeks   Status On-going     OT LONG TERM GOAL #3   Title Patient will decrease pain level in LUE during work activities to a 3/10 or less.   Time 6   Period Weeks   Status On-going     OT LONG TERM GOAL #4   Title Patient will increase A/ROM to [facility] to increase ability to hook and unhook bra.   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   Time 6   Period Weeks   Status On-going               Plan - 05/09/17 1201    Clinical Impression Statement A: Pt reports that after iontophoresis last session her pain was significantly lower and she was able to sleep until 3am which she has not been able to do for a while. Added overhead lacing to increase functional reaching ability. Pt did experience small amont of adhesion release in scapular region during passive stretching.    Plan P: [place] with heat at starts of session. gentle P/ROM, and iontophoresis at end of session. Add wall push ups and reaching into cabinets with cones.       Patient will benefit from skilled therapeutic intervention in order to improve the following deficits and impairments:  Pain, Decreased range of motion, Decreased strength, Increased fascial restricitons, Impaired UE functional use  Visit Diagnosis: Acute pain of left shoulder  Other symptoms and signs involving the  musculoskeletal system  Stiffness of left shoulder, not elsewhere classified    Problem List Patient Active Problem List   Diagnosis Date Noted  . Dyspepsia and disorder of function of stomach   . Constipation 02/15/2017  . Dysphagia 02/15/2017  . Encounter for screening colonoscopy 02/15/2017  . Arthritis of knee, degenerative 03/09/2014  . Knee pain 08/21/2011  . Stiffness of joint, not elsewhere classified, lower leg 08/21/2011  . Medial meniscus, posterior horn derangement 08/02/2011  . Torn meniscus 07/19/2011  . HIP PAIN 02/16/2009   Ailene Ravel, OTR/L,CBIS  [phone]  05/09/2017, 12:04 PM  [place] [address] [place], [place], [address] Phone: [phone]   Fax:  [phone]  Name: MINIYA MIGUEZ MRN: [id number] Date of Birth: 1966/02/11

## 2017-05-15 ENCOUNTER — Encounter (HOSPITAL_COMMUNITY): Payer: Self-pay

## 2017-05-15 ENCOUNTER — Ambulatory Visit (HOSPITAL_COMMUNITY): Payer: BLUE CROSS/BLUE SHIELD | Attending: Orthopedic Surgery

## 2017-05-15 DIAGNOSIS — M25512 Pain in left shoulder: Secondary | ICD-10-CM | POA: Insufficient documentation

## 2017-05-15 DIAGNOSIS — R29898 Other symptoms and signs involving the musculoskeletal system: Secondary | ICD-10-CM | POA: Insufficient documentation

## 2017-05-15 DIAGNOSIS — M25612 Stiffness of left shoulder, not elsewhere classified: Secondary | ICD-10-CM | POA: Insufficient documentation

## 2017-05-15 NOTE — Therapy (Signed)
[place], [place], [address] Phone: [phone]   Fax:  [phone]  Occupational Therapy Treatment  Patient Details  Name: Carla Wise MRN: [id number] Date of Birth: 1966-01-26 Referring Provider: [name], MD  Encounter Date: 05/15/2017      OT End of Session - 05/15/17 1603    Visit Number 11   Number of Visits 12   Date for OT Re-Evaluation 05/17/17   Authorization Type BCBS    Authorization Time Period 30 visit limit with 0 used this year.   Authorization - Visit Number 11   Authorization - Number of Visits 30   OT Start Time 3559   OT Stop Time 1515   OT Time Calculation (min) 44 min   Activity Tolerance Patient tolerated treatment well   Behavior During Therapy [facility] for tasks assessed/performed      Past Medical History:  Diagnosis Date  . Constipation   . Gastric adenoma 02/2017  . PONV (postoperative nausea and vomiting)     Past Surgical History:  Procedure Laterality Date  . ESOPHAGOGASTRODUODENOSCOPY N/A 03/01/2017   Procedure: ESOPHAGOGASTRODUODENOSCOPY (EGD);  Surgeon: Danie Binder, MD;  [facility];  Service: Endoscopy;  Laterality: N/A;  . FOOT SURGERY  2003  . KNEE ARTHROSCOPY W/ MENISCECTOMY     left knee  . SAVORY DILATION N/A 03/01/2017   Procedure: SAVORY DILATION;  Surgeon: Danie Binder, MD;  [facility];  Service: Endoscopy;  Laterality: N/A;    There were no vitals filed for this visit.      Subjective Assessment - 05/15/17 1434    Subjective  S: The pain is the worst at night. I can't get enough sleep becasue of it.   Currently in Pain? Yes   Pain Score 6    Pain Location Shoulder   Pain Orientation Left   Pain Descriptors / Indicators Aching;Constant   Pain Type Acute pain   Pain Radiating Towards N/A   Pain Onset More than a month ago   Pain Frequency Constant   Aggravating Factors  using it   Pain Relieving Factors heat   Effect of Pain on Daily Activities severe   Multiple Pain Sites No            [facility] OT Assessment - 05/15/17 1600      Assessment   Diagnosis Left shoulder adhesive capsulitis     Precautions   Precautions None                  OT Treatments/Exercises (OP) - 05/15/17 1600      Exercises   Exercises Shoulder     Shoulder Exercises: Supine   Protraction PROM;5 reps   Horizontal ABduction PROM;5 reps   External Rotation PROM;5 reps   Internal Rotation PROM;5 reps   Flexion PROM;5 reps   ABduction PROM;5 reps     Shoulder Exercises: Standing   Protraction AAROM;12 reps   Horizontal ABduction AAROM;12 reps   External Rotation AAROM;12 reps   Internal Rotation AAROM;12 reps   Flexion AAROM;12 reps   ABduction AAROM;12 reps     Shoulder Exercises: ROM/Strengthening   Wall Pushups 10 reps     Functional Reaching Activities   Mid Level Pt completed mid level functional reaching task with 10 cones placing them on the first shelf then taking them from the first shelf and moving them directly to the second shelf and back down.  Modalities   Modalities Moist Heat;Iontophoresis     Moist Heat Therapy   Number Minutes Moist Heat 10 Minutes   Moist Heat Location Shoulder     Iontophoresis   Type of Iontophoresis Dexamethasone   Location left deltoid   Dose 40 dose 4.0 current 2cc   Time 10 minutes                OT Education - 05/15/17 1603    Education provided No          OT Short Term Goals - 04/11/17 1344      OT SHORT TERM GOAL #1   Title Patient will be educated and independent with HEP to increase functional use of LUE during daily tasks.    Time 3   Period Weeks   Status On-going     OT SHORT TERM GOAL #2   Title Patient will increase P/ROM to [facility] to increase ability to get shirts on and off with less difficulty.   Time 3   Period Weeks   Status On-going     OT SHORT TERM GOAL #3   Title Patient will increase LUE strength  to 3/5 to increase functional reaching ability to increase ability to brush hair.   Time 3   Period Weeks   Status On-going     OT SHORT TERM GOAL #4   Title Patient will decrease pain level in LUE to 5/10 during light household tasks.    Time 3   Period Weeks   Status On-going     OT SHORT TERM GOAL #5   Title patient will decrease fascial restrictions to mod amount to increase ability to complete tasks at shoulder with less difficulty.   Time 3   Period Weeks   Status On-going           OT Long Term Goals - 04/11/17 1344      OT LONG TERM GOAL #1   Title Patient will return to highest level of independence when using LUE for all daily and work related tasks.    Time 6   Period Weeks   Status On-going     OT LONG TERM GOAL #2   Title Patient will decrease fascial restrictions to min amount or less in order to increase functional reaching ability.   Time 6   Period Weeks   Status On-going     OT LONG TERM GOAL #3   Title Patient will decrease pain level in LUE during work activities to a 3/10 or less.   Time 6   Period Weeks   Status On-going     OT LONG TERM GOAL #4   Title Patient will increase A/ROM to [facility] to increase ability to hook and unhook bra.   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   Time 6   Period Weeks   Status On-going               Plan - 05/15/17 1604    Clinical Impression Statement A: Pt reports that pain has still been consistent with exception to one time of having decreased pain from the iontophoresis. Completed mid level functional reaching activity and wall pushups without pain. VC needed for form and technique.    Plan P: Continue with heat at beginning  until P/ROM, end with iontophoresis. Measure for doctor's appointment.      Patient will benefit from skilled therapeutic intervention in  order to improve the following deficits and impairments:  Pain, Decreased range of motion, Decreased strength, Increased fascial restricitons, Impaired UE functional use  Visit Diagnosis: Acute pain of left shoulder  Other symptoms and signs involving the musculoskeletal system  Stiffness of left shoulder, not elsewhere classified    Problem List Patient Active Problem List   Diagnosis Date Noted  . Dyspepsia and disorder of function of stomach   . Constipation 02/15/2017  . Dysphagia 02/15/2017  . Encounter for screening colonoscopy 02/15/2017  . Arthritis of knee, degenerative 03/09/2014  . Knee pain 08/21/2011  . Stiffness of joint, not elsewhere classified, lower leg 08/21/2011  . Medial meniscus, posterior horn derangement 08/02/2011  . Torn meniscus 07/19/2011  . HIP PAIN 02/16/2009    Luther Hearing, OT Student [phone] 05/15/2017, 4:12 PM  [facility] [address] [place], [place], [address] Phone: [phone]   Fax:  [phone]  Name: Carla Wise MRN: [id number] Date of Birth: 06-12-1966   Note reviewed by clinical instructor and accurately reflects treatment session.   Ailene Ravel, OTR/L,CBIS  [phone]

## 2017-05-16 ENCOUNTER — Ambulatory Visit (HOSPITAL_COMMUNITY): Payer: BLUE CROSS/BLUE SHIELD

## 2017-05-16 ENCOUNTER — Encounter (HOSPITAL_COMMUNITY): Payer: Self-pay

## 2017-05-16 DIAGNOSIS — R29898 Other symptoms and signs involving the musculoskeletal system: Secondary | ICD-10-CM

## 2017-05-16 DIAGNOSIS — M25612 Stiffness of left shoulder, not elsewhere classified: Secondary | ICD-10-CM

## 2017-05-16 DIAGNOSIS — M25512 Pain in left shoulder: Secondary | ICD-10-CM

## 2017-05-16 NOTE — Therapy (Signed)
[place], [place], [address] Phone: [phone]   Fax:  [phone]  Occupational Therapy Treatment  Patient Details  Name: Carla Wise MRN: [id number] Date of Birth: 12-13-1965 Referring Provider: [name], MD  Encounter Date: 05/16/2017      OT End of Session - 05/16/17 0941    Visit Number 12   Number of Visits 20   Date for OT Re-Evaluation 06/15/17   Authorization Type BCBS    Authorization Time Period 30 visit limit with 0 used this year.   Authorization - Visit Number 12   Authorization - Number of Visits 30   OT Start Time [phone]   OT Stop Time 0946   OT Time Calculation (min) 50 min   Activity Tolerance Patient tolerated treatment well   Behavior During Therapy [facility] for tasks assessed/performed      Past Medical History:  Diagnosis Date  . Constipation   . Gastric adenoma 02/2017  . PONV (postoperative nausea and vomiting)     Past Surgical History:  Procedure Laterality Date  . ESOPHAGOGASTRODUODENOSCOPY N/A 03/01/2017   Procedure: ESOPHAGOGASTRODUODENOSCOPY (EGD);  Surgeon: Danie Binder, MD;  [facility];  Service: Endoscopy;  Laterality: N/A;  . FOOT SURGERY  2003  . KNEE ARTHROSCOPY W/ MENISCECTOMY     left knee  . SAVORY DILATION N/A 03/01/2017   Procedure: SAVORY DILATION;  Surgeon: Danie Binder, MD;  [facility];  Service: Endoscopy;  Laterality: N/A;    There were no vitals filed for this visit.      Subjective Assessment - 05/16/17 0912    Subjective  S: I actually got some sleep last night but the pain is about the same.   Currently in Pain? Yes   Pain Score 6    Pain Location Shoulder   Pain Orientation Left   Pain Descriptors / Indicators Aching;Constant   Pain Type Acute pain   Pain Radiating Towards N/A   Pain Onset More than a month ago   Pain Frequency Constant   Aggravating Factors  using it   Pain Relieving Factors heat   Effect of Pain on Daily Activities severe   Multiple Pain Sites No            [facility] OT Assessment - 05/16/17 0910      AROM   Overall AROM Comments A/ROM are the same as two weeks ago on 05/02/17.     PROM   Overall PROM Comments Assessed supine. IR/er adducted   PROM Assessment Site Shoulder   Right/Left Shoulder Left   Left Shoulder Flexion 120 Degrees  previous 127 degrees   Left Shoulder ABduction 116 Degrees  previous 119 degrees   Left Shoulder Internal Rotation 90 Degrees  previous 90   Left Shoulder External Rotation 50 Degrees  previous 50 degrees     Strength   Overall Strength Comments Assessed seated. IR/er adducted   Strength Assessment Site Shoulder   Right/Left Shoulder Left   Left Shoulder Flexion 3-/5   Left Shoulder ABduction 3-/5   Left Shoulder Internal Rotation 4-/5   Left Shoulder External Rotation 4-/5                  OT Treatments/Exercises (OP) - 05/16/17 0918      Exercises   Exercises Shoulder     Shoulder Exercises: Therapy Ball   Right/Left Other (comment)  2 reps each way     Shoulder  Exercises: Stretch   Cross Chest Stretch 60 seconds;2 reps   Internal Rotation Stretch 60 seconds  Using towel, stretching vertical   Wall Stretch - Flexion 60 seconds;1 rep   Star Gazer Stretch 10 seconds;3 reps   Other Shoulder Stretches Sleeper stretch, 10 sec 3X; modified er wall stretch 1' 2X     Modalities   Modalities Moist Heat     Moist Heat Therapy   Number Minutes Moist Heat 10 Minutes   Moist Heat Location Shoulder     Iontophoresis   Type of Iontophoresis Dexamethasone   Location left deltoid   Dose 40 dose 4.0 current 2cc   Time 10 minutes                OT Education - 05/15/17 1603    Education provided No          OT Short Term Goals - 05/16/17 0946      OT SHORT TERM GOAL #1   Title Patient will be educated and independent with HEP to increase functional use of LUE during daily tasks.    Time  3   Period Weeks   Status Achieved     OT SHORT TERM GOAL #2   Title Patient will increase P/ROM to [facility] to increase ability to get shirts on and off with less difficulty.   Baseline 05/16/17: She can donn and doff her shirts but with the use of compensatory strategies.   Time 3   Period Weeks   Status Partially Met     OT SHORT TERM GOAL #3   Title Patient will increase LUE strength to 3/5 to increase functional reaching ability to increase ability to brush hair.   Time 3   Period Weeks   Status On-going     OT SHORT TERM GOAL #4   Title Patient will decrease pain level in LUE to 5/10 during light household tasks.    Baseline 05/16/17: Her pain typically stays at a 6/10.   Time 3   Period Weeks   Status Partially Met     OT SHORT TERM GOAL #5   Title patient will decrease fascial restrictions to mod amount to increase ability to complete tasks at shoulder with less difficulty.   Time 3   Period Weeks   Status On-going           OT Long Term Goals - 04/11/17 1344      OT LONG TERM GOAL #1   Title Patient will return to highest level of independence when using LUE for all daily and work related tasks.    Time 6   Period Weeks   Status On-going     OT LONG TERM GOAL #2   Title Patient will decrease fascial restrictions to min amount or less in order to increase functional reaching ability.   Time 6   Period Weeks   Status On-going     OT LONG TERM GOAL #3   Title Patient will decrease pain level in LUE during work activities to a 3/10 or less.   Time 6   Period Weeks   Status On-going     OT LONG TERM GOAL #4   Title Patient will increase A/ROM to [facility] to increase ability to hook and unhook bra.   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   Time 6   Period Weeks   Status On-going               Plan - 05/16/17 2575     Clinical Impression Statement A: Began treatment with heat and ended with iontophoresis to decrease pain. Pt completed er/IR stretches in modified position due to pain. Measurements of shoulder movements taken for MD appointment, degree of motion stayed relatively the same.  Pt reports pain decreased to 5/10 at end of session.   Plan P: Follow up on doctor's appointment. Continue with heat at the beginning and iontophoresis at the end.      Patient will benefit from skilled therapeutic intervention in order to improve the following deficits and impairments:  Pain, Decreased range of motion, Decreased strength, Increased fascial restricitons, Impaired UE functional use  Visit Diagnosis: Acute pain of left shoulder  Other symptoms and signs involving the musculoskeletal system  Stiffness of left shoulder, not elsewhere classified    Problem List Patient Active Problem List   Diagnosis Date Noted  . Dyspepsia and disorder of function of stomach   . Constipation 02/15/2017  . Dysphagia 02/15/2017  . Encounter for screening colonoscopy 02/15/2017  . Arthritis of knee, degenerative 03/09/2014  . Knee pain 08/21/2011  . Stiffness of joint, not elsewhere classified, lower leg 08/21/2011  . Medial meniscus, posterior horn derangement 08/02/2011  . Torn meniscus 07/19/2011  . HIP PAIN 02/16/2009    Luther Hearing, OT Student [phone] 05/16/2017, 1:15 PM  [facility] [address] [place], [place], [address] Phone: [phone]   Fax:  [phone]  Name: APRYLE STOWELL MRN: [id number] Date of Birth: Oct 23, 1966   Note reviewed by clinical instructor and accurately reflects treatment session.   Ailene Ravel, OTR/L,CBIS  [phone]

## 2017-05-17 ENCOUNTER — Encounter: Payer: Self-pay | Admitting: Orthopedic Surgery

## 2017-05-17 ENCOUNTER — Encounter (HOSPITAL_COMMUNITY): Payer: BLUE CROSS/BLUE SHIELD

## 2017-05-17 ENCOUNTER — Ambulatory Visit: Payer: BLUE CROSS/BLUE SHIELD | Admitting: Gastroenterology

## 2017-05-17 ENCOUNTER — Ambulatory Visit (INDEPENDENT_AMBULATORY_CARE_PROVIDER_SITE_OTHER): Payer: BLUE CROSS/BLUE SHIELD | Admitting: Orthopedic Surgery

## 2017-05-17 DIAGNOSIS — M7502 Adhesive capsulitis of left shoulder: Secondary | ICD-10-CM | POA: Diagnosis not present

## 2017-05-17 NOTE — Progress Notes (Signed)
Patient ID: Carla Wise, female   DOB: 01-18-1966, 51 y.o.   MRN: [id number]  Chief Complaint  Patient presents with  . Follow-up    Adhesive capsulitis left shoulder    51 year old female undergoing treatment for adhesive capsulitis since February. We are little over 3 months into treatment including injection and physical therapy steroid orally NSAIDs orally and pain medication for pain control. She has improved range of motion    ROS   Current Outpatient Prescriptions:  .  diclofenac (CATAFLAM) 50 MG tablet, Take 1 tablet (50 mg total) by mouth 2 (two) times daily., Disp: 90 tablet, Rfl: 3 .  HYDROcodone-acetaminophen (NORCO) 5-325 MG tablet, Take 1 tablet by mouth every 6 (six) hours as needed for moderate pain., Disp: 30 tablet, Rfl: 0   There were no vitals taken for this visit. Gen. appearance is normal grooming and hygiene normal Orientation to person place and time normal Mood normal   Ortho Exam Today in the office or passive external rotation is 15 her active abduction is 90 her active flexion is 110   A/P  Medical decision-making  Encounter Diagnosis  Name Primary?  . Adhesive capsulitis of left shoulder Yes   She will continue out of work status all see her in 2 weeks she'll continue physical therapy   [name], MD 05/17/2017 9:01 AM

## 2017-05-17 NOTE — Patient Instructions (Signed)
OOW NOTE

## 2017-05-20 ENCOUNTER — Encounter (HOSPITAL_COMMUNITY): Payer: Self-pay

## 2017-05-20 ENCOUNTER — Ambulatory Visit (HOSPITAL_COMMUNITY): Payer: BLUE CROSS/BLUE SHIELD

## 2017-05-20 DIAGNOSIS — R29898 Other symptoms and signs involving the musculoskeletal system: Secondary | ICD-10-CM | POA: Diagnosis not present

## 2017-05-20 DIAGNOSIS — M25512 Pain in left shoulder: Secondary | ICD-10-CM | POA: Diagnosis not present

## 2017-05-20 DIAGNOSIS — M25612 Stiffness of left shoulder, not elsewhere classified: Secondary | ICD-10-CM

## 2017-05-20 NOTE — Therapy (Signed)
[place], [place], [address] Phone: [phone]   Fax:  [phone]  Occupational Therapy Treatment  Patient Details  Name: Carla Wise MRN: [id number] Date of Birth: 22-May-1966 Referring Provider: [name], MD  Encounter Date: 05/20/2017      OT End of Session - 05/20/17 1206    Visit Number 13   Number of Visits 20   Date for OT Re-Evaluation 06/15/17   Authorization Type BCBS    Authorization Time Period 30 visit limit with 0 used this year.   Authorization - Visit Number 13   Authorization - Number of Visits 30   OT Start Time 1324   OT Stop Time 1213   OT Time Calculation (min) 54 min   Activity Tolerance Patient tolerated treatment well   Behavior During Therapy WFL for tasks assessed/performed      Past Medical History:  Diagnosis Date  . Constipation   . Gastric adenoma 02/2017  . PONV (postoperative nausea and vomiting)     Past Surgical History:  Procedure Laterality Date  . ESOPHAGOGASTRODUODENOSCOPY N/A 03/01/2017   Procedure: ESOPHAGOGASTRODUODENOSCOPY (EGD);  Surgeon: Danie Binder, MD;  [facility];  Service: Endoscopy;  Laterality: N/A;  . FOOT SURGERY  2003  . KNEE ARTHROSCOPY W/ MENISCECTOMY     left knee  . SAVORY DILATION N/A 03/01/2017   Procedure: SAVORY DILATION;  Surgeon: Danie Binder, MD;  [facility];  Service: Endoscopy;  Laterality: N/A;    There were no vitals filed for this visit.      Subjective Assessment - 05/20/17 1120    Subjective  S: I haven't done anything today so it's not bad (the pain).   Currently in Pain? Yes   Pain Score 5    Pain Location Shoulder   Pain Orientation Left   Pain Descriptors / Indicators Aching;Constant   Pain Type Acute pain   Pain Radiating Towards N/A   Pain Onset More than a month ago   Pain Frequency Constant   Aggravating Factors  using it   Pain Relieving Factors heat   Effect of  Pain on Daily Activities severe   Multiple Pain Sites No            [facility] OT Assessment - 05/20/17 1124      Assessment   Diagnosis Left shoulder adhesive capsulitis     Precautions   Precautions None                  OT Treatments/Exercises (OP) - 05/20/17 1124      Exercises   Exercises Shoulder     Shoulder Exercises: Supine   Protraction PROM;5 reps   Horizontal ABduction PROM;5 reps   External Rotation PROM;5 reps   Internal Rotation PROM;5 reps   Flexion PROM;5 reps   ABduction PROM;5 reps     Shoulder Exercises: Standing   Protraction AAROM;15 reps   Horizontal ABduction AAROM;15 reps   External Rotation AAROM;15 reps   Internal Rotation AAROM;15 reps   Flexion AAROM;15 reps   ABduction AAROM;15 reps     Shoulder Exercises: Stretch   Cross Chest Stretch 60 seconds;2 reps   Internal Rotation Stretch 60 seconds   Wall Stretch - Flexion 60 seconds;1 rep     Modalities   Modalities Moist Heat;Iontophoresis     Moist Heat Therapy   Number Minutes Moist Heat 10 Minutes   Moist Heat Location Shoulder  Iontophoresis   Type of Iontophoresis Dexamethasone   Location left deltoid   Dose 40 dose 4.0 current 2cc   Time 10 minutes                OT Education - 05/20/17 1205    Education provided No          OT Short Term Goals - 05/20/17 1237      OT SHORT TERM GOAL #1   Title Patient will be educated and independent with HEP to increase functional use of LUE during daily tasks.    Time 3   Period Weeks     OT SHORT TERM GOAL #2   Title Patient will increase P/ROM to [facility] to increase ability to get shirts on and off with less difficulty.   Baseline 05/16/17: She can donn and doff her shirts but with the use of compensatory strategies.   Time 3   Period Weeks   Status Partially Met     OT SHORT TERM GOAL #3   Title Patient will increase LUE strength to 3/5 to increase functional reaching ability to increase ability to brush  hair.   Time 3   Period Weeks   Status On-going     OT SHORT TERM GOAL #4   Title Patient will decrease pain level in LUE to 5/10 during light household tasks.    Baseline 05/16/17: Her pain typically stays at a 6/10.   Time 3   Period Weeks   Status Partially Met     OT SHORT TERM GOAL #5   Title patient will decrease fascial restrictions to mod amount to increase ability to complete tasks at shoulder with less difficulty.   Time 3   Period Weeks   Status On-going           OT Long Term Goals - 04/11/17 1344      OT LONG TERM GOAL #1   Title Patient will return to highest level of independence when using LUE for all daily and work related tasks.    Time 6   Period Weeks   Status On-going     OT LONG TERM GOAL #2   Title Patient will decrease fascial restrictions to min amount or less in order to increase functional reaching ability.   Time 6   Period Weeks   Status On-going     OT LONG TERM GOAL #3   Title Patient will decrease pain level in LUE during work activities to a 3/10 or less.   Time 6   Period Weeks   Status On-going     OT LONG TERM GOAL #4   Title Patient will increase A/ROM to [facility] to increase ability to hook and unhook bra.   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   Time 6   Period Weeks   Status On-going               Plan - 05/20/17 1208    Clinical Impression Statement A: Began treatment with heat and ended with iontophoresis to decrease pain. Pt increased AA/ROM reps. Stretching exercises completed with VC needed for form and technique. Dr visit went well, pt is to continue therapy and follow up with him in two weeks.   Plan P: Continue with heat at the  beginning and iontophoresis at the end. Add weighted ball circles around head and waist.      Patient will benefit from skilled therapeutic  intervention in order to improve the following deficits and impairments:  Pain, Decreased range of motion, Decreased strength, Increased fascial restricitons, Impaired UE functional use  Visit Diagnosis: Acute pain of left shoulder  Other symptoms and signs involving the musculoskeletal system  Stiffness of left shoulder, not elsewhere classified    Problem List Patient Active Problem List   Diagnosis Date Noted  . Dyspepsia and disorder of function of stomach   . Constipation 02/15/2017  . Dysphagia 02/15/2017  . Encounter for screening colonoscopy 02/15/2017  . Arthritis of knee, degenerative 03/09/2014  . Knee pain 08/21/2011  . Stiffness of joint, not elsewhere classified, lower leg 08/21/2011  . Medial meniscus, posterior horn derangement 08/02/2011  . Torn meniscus 07/19/2011  . HIP PAIN 02/16/2009    Luther Hearing, OT Student  [phone] 05/20/2017, 12:38 PM  [facility] [address] [place], [place], [address] Phone: [phone]   Fax:  [phone]  Name: Carla Wise MRN: [id number] Date of Birth: 01/18/1966    Note reviewed by clinical instructor and accurately reflects treatment session.   Ailene Ravel, OTR/L,CBIS  [phone]

## 2017-05-23 ENCOUNTER — Encounter (HOSPITAL_COMMUNITY): Payer: Self-pay

## 2017-05-23 ENCOUNTER — Ambulatory Visit (HOSPITAL_COMMUNITY): Payer: BLUE CROSS/BLUE SHIELD

## 2017-05-23 DIAGNOSIS — M25612 Stiffness of left shoulder, not elsewhere classified: Secondary | ICD-10-CM | POA: Diagnosis not present

## 2017-05-23 DIAGNOSIS — M25512 Pain in left shoulder: Secondary | ICD-10-CM | POA: Diagnosis not present

## 2017-05-23 DIAGNOSIS — R29898 Other symptoms and signs involving the musculoskeletal system: Secondary | ICD-10-CM | POA: Diagnosis not present

## 2017-05-23 NOTE — Therapy (Signed)
[place], [place], [address] Phone: [phone]   Fax:  [phone]  Occupational Therapy Treatment  Patient Details  Name: Carla Wise MRN: [id number] Date of Birth: 1966/12/09 Referring Provider: [name], MD  Encounter Date: 05/23/2017      OT End of Session - 05/23/17 1046    Visit Number 14   Number of Visits 20   Date for OT Re-Evaluation 06/15/17   Authorization Type BCBS    Authorization Time Period 30 visit limit with 0 used this year.   Authorization - Visit Number 14   Authorization - Number of Visits 30   OT Start Time [phone]   OT Stop Time 1050   OT Time Calculation (min) 67 min   Activity Tolerance Patient tolerated treatment well   Behavior During Therapy WFL for tasks assessed/performed      Past Medical History:  Diagnosis Date  . Constipation   . Gastric adenoma 02/2017  . PONV (postoperative nausea and vomiting)     Past Surgical History:  Procedure Laterality Date  . ESOPHAGOGASTRODUODENOSCOPY N/A 03/01/2017   Procedure: ESOPHAGOGASTRODUODENOSCOPY (EGD);  Surgeon: Danie Binder, MD;  [facility];  Service: Endoscopy;  Laterality: N/A;  . FOOT SURGERY  2003  . KNEE ARTHROSCOPY W/ MENISCECTOMY     left knee  . SAVORY DILATION N/A 03/01/2017   Procedure: SAVORY DILATION;  Surgeon: Danie Binder, MD;  [facility];  Service: Endoscopy;  Laterality: N/A;    There were no vitals filed for this visit.      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   Currently in Pain? Yes   Pain Score 4    Pain Location Shoulder   Pain Orientation Left   Pain Descriptors / Indicators Aching;Constant   Pain Type Acute pain   Pain Radiating Towards N/A   Pain Onset More than a month ago   Pain Frequency Constant   Aggravating Factors  using it   Pain Relieving Factors heat   Effect of Pain on Daily  Activities severe   Multiple Pain Sites No            [facility] OT Assessment - 05/23/17 1103      Assessment   Diagnosis Left shoulder adhesive capsulitis     Precautions   Precautions None                  OT Treatments/Exercises (OP) - 05/23/17 1005      Exercises   Exercises Shoulder     Shoulder Exercises: Supine   Protraction PROM;5 reps   Horizontal ABduction PROM;5 reps   External Rotation PROM;5 reps   Internal Rotation PROM;5 reps   Flexion PROM;5 reps   ABduction PROM;5 reps     Shoulder Exercises: Standing   Protraction AAROM;15 reps   Horizontal ABduction AAROM;15 reps   External Rotation AAROM;15 reps   Internal Rotation AAROM;15 reps   Flexion AAROM;15 reps;Theraband;10 reps   Theraband Level (Shoulder Flexion) Level 2 (Red)   ABduction AAROM;15 reps   Extension Theraband;10 reps   Theraband Level (Shoulder Extension) Level 2 (Red)   Row Theraband;10 reps   Theraband Level (Shoulder Row) Level 3 (Green)     Shoulder Exercises: ROM/Strengthening   Other ROM/Strengthening Exercises Pt passed green weighted ball around head and waist 10X each     Shoulder Exercises:  Stretch   Cross Chest Stretch 60 seconds;2 reps   Internal Rotation Stretch 60 seconds   Wall Stretch - Flexion 60 seconds;1 rep   Other Shoulder Stretches child's pose for 3 sets of 30 seconds     Modalities   Modalities Moist Heat;Iontophoresis     Moist Heat Therapy   Number Minutes Moist Heat 10 Minutes   Moist Heat Location Shoulder     Iontophoresis   Type of Iontophoresis Dexamethasone   Location left deltoid   Dose 40 dose 4.0 current 2cc   Time 10 minutes                OT Education - 05/23/17 1047    Education provided Yes   Education Details Provided pt with HEP of red theraband exercises for flexion, extension, rows; provided handout for child's pose. Discussed adhesive capsulities and the course that it typically tasks. Reassured her that she is  technically in the early phase of her condition so she is performing as would be expected.    Person(s) Educated Patient   Methods Explanation;Demonstration;Verbal cues;Handout   Comprehension Verbalized understanding;Returned demonstration          OT Short Term Goals - 05/20/17 1237      OT SHORT TERM GOAL #1   Title Patient will be educated and independent with HEP to increase functional use of LUE during daily tasks.    Time 3   Period Weeks     OT SHORT TERM GOAL #2   Title Patient will increase P/ROM to [facility] to increase ability to get shirts on and off with less difficulty.   Baseline 05/16/17: She can donn and doff her shirts but with the use of compensatory strategies.   Time 3   Period Weeks   Status Partially Met     OT SHORT TERM GOAL #3   Title Patient will increase LUE strength to 3/5 to increase functional reaching ability to increase ability to brush hair.   Time 3   Period Weeks   Status On-going     OT SHORT TERM GOAL #4   Title Patient will decrease pain level in LUE to 5/10 during light household tasks.    Baseline 05/16/17: Her pain typically stays at a 6/10.   Time 3   Period Weeks   Status Partially Met     OT SHORT TERM GOAL #5   Title patient will decrease fascial restrictions to mod amount to increase ability to complete tasks at shoulder with less difficulty.   Time 3   Period Weeks   Status On-going           OT Long Term Goals - 04/11/17 1344      OT LONG TERM GOAL #1   Title Patient will return to highest level of independence when using LUE for all daily and work related tasks.    Time 6   Period Weeks   Status On-going     OT LONG TERM GOAL #2   Title Patient will decrease fascial restrictions to min amount or less in order to increase functional reaching ability.   Time 6   Period Weeks   Status On-going     OT LONG TERM GOAL #3   Title Patient will decrease pain level in LUE during work activities to a 3/10 or less.   Time 6    Period Weeks   Status On-going     OT LONG TERM GOAL #4   Title Patient  will increase A/ROM to [facility] to increase ability to hook and unhook bra.   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   Time 6   Period Weeks   Status On-going               Plan - 05/23/17 1054    Clinical Impression Statement A: Began treatment with heat and ended with iontophoresis to decrease pain. Completed P/ROM, AA/ROM and wall stretches of left shoulder with VC neeed for form and technique. Completed child's pose for increased ROM of shoulder flexion. Added ball passes around head and waist for er/IR ROM. Pt completed theraband strengthening and requested to add this to HEP. Pt expressed concern about work that if she is not back by the 06/08/17 then she will no longer have a job.    Plan P: Continue with heat and iontophoresis. Continue to progress with theraband shoulder strengthening exercises.   OT Home Exercise Plan 4/26: scapular A/ROM exercises and table slides. 05/23/17: theraband shoulder exercises, child's pose      Patient will benefit from skilled therapeutic intervention in order to improve the following deficits and impairments:  Pain, Decreased range of motion, Decreased strength, Increased fascial restricitons, Impaired UE functional use  Visit Diagnosis: Acute pain of left shoulder  Other symptoms and signs involving the musculoskeletal system  Stiffness of left shoulder, not elsewhere classified    Problem List Patient Active Problem List   Diagnosis Date Noted  . Dyspepsia and disorder of function of stomach   . Constipation 02/15/2017  . Dysphagia 02/15/2017  . Encounter for screening colonoscopy 02/15/2017  . Arthritis of knee, degenerative 03/09/2014  . Knee pain 08/21/2011  . Stiffness of joint, not elsewhere classified, lower leg  08/21/2011  . Medial meniscus, posterior horn derangement 08/02/2011  . Torn meniscus 07/19/2011  . HIP PAIN 02/16/2009    Luther Hearing, OT Student [phone] 05/23/2017, 11:29 AM  [facility] [address] [place], [place], [address] Phone: [phone]   Fax:  [phone]  Name: Carla Wise MRN: [id number] Date of Birth: [birth year]   Note reviewed by clinical instructor and accurately reflects treatment session.   Ailene Ravel, OTR/L,CBIS  [phone]

## 2017-05-23 NOTE — Patient Instructions (Signed)
2-3 times per day   (Home) Retraction: Row - Bilateral (Anchor)   Facing anchor, arms reaching forward, pull hands toward stomach, keeping elbows bent and at your sides and pinching shoulder blades together. Repeat 10-15 times.  Copyright  VHI. All rights reserved.   (Clinic) Extension / Flexion (Assist)   Face anchor, pull arms back, keeping elbow straight, and squeze shoulder blades together. Repeat 10-15 times.   Copyright  VHI. All rights reserved.    ELASTIC BAND SHOULDER FLEXION- 10X   While holding an elastic band at your side, draw up your arm up in front of you keeping your elbow straight.

## 2017-05-27 ENCOUNTER — Encounter (HOSPITAL_COMMUNITY): Payer: Self-pay

## 2017-05-27 ENCOUNTER — Ambulatory Visit (HOSPITAL_COMMUNITY): Payer: BLUE CROSS/BLUE SHIELD

## 2017-05-27 DIAGNOSIS — R29898 Other symptoms and signs involving the musculoskeletal system: Secondary | ICD-10-CM | POA: Diagnosis not present

## 2017-05-27 DIAGNOSIS — M25612 Stiffness of left shoulder, not elsewhere classified: Secondary | ICD-10-CM | POA: Diagnosis not present

## 2017-05-27 DIAGNOSIS — M25512 Pain in left shoulder: Secondary | ICD-10-CM | POA: Diagnosis not present

## 2017-05-27 NOTE — Therapy (Signed)
[place], [place], [address] Phone: [phone]   Fax:  [phone]  Occupational Therapy Treatment  Patient Details  Name: Carla Wise MRN: [id number] Date of Birth: Apr 29, 1966 Referring Provider: [name], MD  Encounter Date: 05/27/2017      OT End of Session - 05/27/17 1157    Visit Number 15   Number of Visits 20   Date for OT Re-Evaluation 06/15/17   Authorization Type BCBS    Authorization Time Period 30 visit limit with 0 used this year.   Authorization - Visit Number 15   Authorization - Number of Visits 30   OT Start Time 1108   OT Stop Time 1207   OT Time Calculation (min) 59 min   Activity Tolerance Patient tolerated treatment well   Behavior During Therapy WFL for tasks assessed/performed      Past Medical History:  Diagnosis Date  . Constipation   . Gastric adenoma 02/2017  . PONV (postoperative nausea and vomiting)     Past Surgical History:  Procedure Laterality Date  . ESOPHAGOGASTRODUODENOSCOPY N/A 03/01/2017   Procedure: ESOPHAGOGASTRODUODENOSCOPY (EGD);  Surgeon: Danie Binder, MD;  [facility];  Service: Endoscopy;  Laterality: N/A;  . FOOT SURGERY  2003  . KNEE ARTHROSCOPY W/ MENISCECTOMY     left knee  . SAVORY DILATION N/A 03/01/2017   Procedure: SAVORY DILATION;  Surgeon: Danie Binder, MD;  [facility];  Service: Endoscopy;  Laterality: N/A;    There were no vitals filed for this visit.      Subjective Assessment - 05/27/17 1130    Subjective  S: I thinkI overdid it with my exercises.   Currently in Pain? Yes   Pain Score 7    Pain Location Shoulder   Pain Orientation Left   Pain Descriptors / Indicators Aching;Constant   Pain Type Acute pain   Pain Radiating Towards N/A   Pain Onset More than a month ago   Pain Frequency Constant   Aggravating Factors  using it   Pain Relieving Factors heat   Effect of Pain on Daily  Activities severe   Multiple Pain Sites No            [facility] OT Assessment - 05/27/17 0001      Assessment   Diagnosis Left shoulder adhesive capsulitis     Precautions   Precautions None                  OT Treatments/Exercises (OP) - 05/27/17 0001      Exercises   Exercises Shoulder     Shoulder Exercises: Supine   Protraction PROM;5 reps   Horizontal ABduction PROM;5 reps   External Rotation PROM;5 reps   Internal Rotation PROM;5 reps   Flexion PROM;5 reps   ABduction PROM;5 reps     Shoulder Exercises: Standing   Protraction AROM;12 reps   Horizontal ABduction AROM;12 reps   External Rotation AROM;12 reps   Internal Rotation AROM;12 reps   Flexion AROM;12 reps   ABduction AROM;12 reps   Extension Theraband;12 reps   Theraband Level (Shoulder Extension) Level 2 (Red)   Row Theraband;12 reps   Theraband Level (Shoulder Row) Level 2 (Red)   Retraction Theraband;12 reps   Theraband Level (Shoulder Retraction) Level 2 (Red)     Shoulder Exercises: Stretch   Cross Chest Stretch 60 seconds;2 reps   Internal Rotation Stretch 60 seconds  1 rep  External Rotation Stretch 60 seconds  1 rep   Wall Stretch - Flexion 60 seconds  1 rep     Modalities   Modalities Moist Heat;Iontophoresis     Moist Heat Therapy   Number Minutes Moist Heat 10 Minutes   Moist Heat Location Shoulder     Iontophoresis   Type of Iontophoresis Dexamethasone   Location left deltoid   Dose 40 dose 4.0 current 2cc   Time 10 minutes                OT Education - 05/27/17 1158    Education provided No          OT Short Term Goals - 05/20/17 1237      OT SHORT TERM GOAL #1   Title Patient will be educated and independent with HEP to increase functional use of LUE during daily tasks.    Time 3   Period Weeks     OT SHORT TERM GOAL #2   Title Patient will increase P/ROM to [facility] to increase ability to get shirts on and off with less difficulty.   Baseline  05/16/17: She can donn and doff her shirts but with the use of compensatory strategies.   Time 3   Period Weeks   Status Partially Met     OT SHORT TERM GOAL #3   Title Patient will increase LUE strength to 3/5 to increase functional reaching ability to increase ability to brush hair.   Time 3   Period Weeks   Status On-going     OT SHORT TERM GOAL #4   Title Patient will decrease pain level in LUE to 5/10 during light household tasks.    Baseline 05/16/17: Her pain typically stays at a 6/10.   Time 3   Period Weeks   Status Partially Met     OT SHORT TERM GOAL #5   Title patient will decrease fascial restrictions to mod amount to increase ability to complete tasks at shoulder with less difficulty.   Time 3   Period Weeks   Status On-going           OT Long Term Goals - 04/11/17 1344      OT LONG TERM GOAL #1   Title Patient will return to highest level of independence when using LUE for all daily and work related tasks.    Time 6   Period Weeks   Status On-going     OT LONG TERM GOAL #2   Title Patient will decrease fascial restrictions to min amount or less in order to increase functional reaching ability.   Time 6   Period Weeks   Status On-going     OT LONG TERM GOAL #3   Title Patient will decrease pain level in LUE during work activities to a 3/10 or less.   Time 6   Period Weeks   Status On-going     OT LONG TERM GOAL #4   Title Patient will increase A/ROM to [facility] to increase ability to hook and unhook bra.   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   Time 6   Period Weeks   Status On-going               Plan - 05/27/17 1158    Clinical  Impression Statement A: Began treatment with heat and ended with iontophoresis to decrease pain. Completed P/ROM and progressed to standing A/ROM. Pt completed wall stretches and  theraband exercises with VC needed for form and technique. Pt expressed that she may have overdone it with her theraband exercises this weekend which is why she has a lot of pain.   Plan P: Continue with heat and iontophoresis. Continue with A/ROM, ball on the wall and overhead lacing. Follow up on MD appt Friday.      Patient will benefit from skilled therapeutic intervention in order to improve the following deficits and impairments:  Pain, Decreased range of motion, Decreased strength, Increased fascial restricitons, Impaired UE functional use  Visit Diagnosis: Acute pain of left shoulder  Other symptoms and signs involving the musculoskeletal system  Stiffness of left shoulder, not elsewhere classified    Problem List Patient Active Problem List   Diagnosis Date Noted  . Dyspepsia and disorder of function of stomach   . Constipation 02/15/2017  . Dysphagia 02/15/2017  . Encounter for screening colonoscopy 02/15/2017  . Arthritis of knee, degenerative 03/09/2014  . Knee pain 08/21/2011  . Stiffness of joint, not elsewhere classified, lower leg 08/21/2011  . Medial meniscus, posterior horn derangement 08/02/2011  . Torn meniscus 07/19/2011  . HIP PAIN 02/16/2009    Luther Hearing, OT Student [phone] 05/27/2017, 2:52 PM  [facility] [address] [place], [place], [address] Phone: [phone]   Fax:  [phone]  Name: Carla Wise MRN: [id number] Date of Birth: Oct 03, 1966    Note reviewed by clinical instructor and accurately reflects treatment session.   Ailene Ravel, OTR/L,CBIS  [phone]

## 2017-05-30 ENCOUNTER — Ambulatory Visit (HOSPITAL_COMMUNITY): Payer: BLUE CROSS/BLUE SHIELD

## 2017-05-30 ENCOUNTER — Encounter (HOSPITAL_COMMUNITY): Payer: Self-pay

## 2017-05-30 DIAGNOSIS — R29898 Other symptoms and signs involving the musculoskeletal system: Secondary | ICD-10-CM

## 2017-05-30 DIAGNOSIS — M25512 Pain in left shoulder: Secondary | ICD-10-CM

## 2017-05-30 DIAGNOSIS — M25612 Stiffness of left shoulder, not elsewhere classified: Secondary | ICD-10-CM

## 2017-05-30 NOTE — Therapy (Signed)
[place], [place], [address] Phone: [phone]   Fax:  [phone]  Occupational Therapy Treatment  Patient Details  Name: Carla Wise MRN: [id number] Date of Birth: 1966-09-11 Referring Provider: [name], MD  Encounter Date: 05/30/2017      OT End of Session - 05/30/17 1159    Visit Number 16   Number of Visits 20   Date for OT Re-Evaluation 06/15/17   Authorization Type [employer]    Authorization Time Period 30 visit limit with 0 used this year.   Authorization - Visit Number 16   Authorization - Number of Visits 30   OT Start Time 3704   OT Stop Time 1206   OT Time Calculation (min) 54 min   Activity Tolerance Patient tolerated treatment well   Behavior During Therapy WFL for tasks assessed/performed      Past Medical History:  Diagnosis Date  . Constipation   . Gastric adenoma 02/2017  . PONV (postoperative nausea and vomiting)     Past Surgical History:  Procedure Laterality Date  . ESOPHAGOGASTRODUODENOSCOPY N/A 03/01/2017   Procedure: ESOPHAGOGASTRODUODENOSCOPY (EGD);  Surgeon: Danie Binder, MD;  [facility];  Service: Endoscopy;  Laterality: N/A;  . FOOT SURGERY  2003  . KNEE ARTHROSCOPY W/ MENISCECTOMY     left knee  . SAVORY DILATION N/A 03/01/2017   Procedure: SAVORY DILATION;  Surgeon: Danie Binder, MD;  [facility];  Service: Endoscopy;  Laterality: N/A;    There were no vitals filed for this visit.      Subjective Assessment - 05/30/17 1200    Subjective  S: I just wish my arm would do something and get this over with already.   Currently in Pain? Yes   Pain Score 7    Pain Location Shoulder   Pain Orientation Left   Pain Descriptors / Indicators Aching;Constant   Pain Type Acute pain   Pain Radiating Towards N/A   Pain Onset More than a month ago   Pain Frequency Constant   Aggravating Factors  using it   Pain Relieving Factors heat    Effect of Pain on Daily Activities severe   Multiple Pain Sites No            [facility] OT Assessment - 05/30/17 1117      Assessment   Diagnosis Left shoulder adhesive capsulitis     Precautions   Precautions None     AROM   Overall AROM Comments Assessed standing. IR/er adducted.   AROM Assessment Site Shoulder   Right/Left Shoulder Left   Left Shoulder Flexion 116 Degrees  previous: 122   Left Shoulder ABduction 103 Degrees  previous: 108   Left Shoulder Internal Rotation 90 Degrees  previous: same   Left Shoulder External Rotation 50 Degrees  previous: 46     PROM   Overall PROM Comments Assessed supine. IR/er adducted   PROM Assessment Site Shoulder   Right/Left Shoulder Left   Left Shoulder Flexion 132 Degrees  previous: 120   Left Shoulder ABduction 96 Degrees  previous; 116   Left Shoulder Internal Rotation 90 Degrees  previous: same   Left Shoulder External Rotation 41 Degrees  previous: 50     Strength   Overall Strength Comments Assessed seated. IR/er adducted   Strength Assessment Site Shoulder   Right/Left Shoulder Left   Left Shoulder Flexion 3-/5  previous: 3-/5   Left Shoulder ABduction 3-/5  previous: 3-/5   Left Shoulder Internal Rotation 4-/5  previous: 4-/5   Left Shoulder External Rotation 4-/5  previous: 4-/5                  OT Treatments/Exercises (OP) - 05/30/17 1117      Exercises   Exercises Shoulder     Shoulder Exercises: Supine   Protraction PROM;5 reps   Horizontal ABduction PROM;5 reps   External Rotation PROM;5 reps   Internal Rotation PROM;5 reps   Flexion PROM;5 reps   ABduction PROM;5 reps     Shoulder Exercises: Standing   Protraction AROM;12 reps   Horizontal ABduction AROM;12 reps   External Rotation AROM;12 reps   Internal Rotation AROM;12 reps   Flexion AROM;12 reps   ABduction AROM;12 reps   Extension --   Theraband Level (Shoulder Extension) --   Row --   Theraband Level (Shoulder Row) --    Retraction --   Theraband Level (Shoulder Retraction) --     Shoulder Exercises: ROM/Strengthening   Ball on Wall 1' flexion, 1' abduction     Modalities   Modalities Moist Heat;Iontophoresis     Moist Heat Therapy   Number Minutes Moist Heat 10 Minutes   Moist Heat Location Shoulder     Iontophoresis   Type of Iontophoresis Dexamethasone   Location left deltoid   Dose 40 dose 4.0 current 2cc   Time 10 minutes                OT Education - 05/30/17 1158    Education provided Yes   Education Details Edcuated pt measurements taken for doctor's appointment   Person(s) Educated Patient   Methods Explanation   Comprehension Verbalized understanding          OT Short Term Goals - 05/20/17 1237      OT SHORT TERM GOAL #1   Title Patient will be educated and independent with HEP to increase functional use of LUE during daily tasks.    Time 3   Period Weeks     OT SHORT TERM GOAL #2   Title Patient will increase P/ROM to [facility] to increase ability to get shirts on and off with less difficulty.   Baseline 05/16/17: She can donn and doff her shirts but with the use of compensatory strategies.   Time 3   Period Weeks   Status Partially Met     OT SHORT TERM GOAL #3   Title Patient will increase LUE strength to 3/5 to increase functional reaching ability to increase ability to brush hair.   Time 3   Period Weeks   Status On-going     OT SHORT TERM GOAL #4   Title Patient will decrease pain level in LUE to 5/10 during light household tasks.    Baseline 05/16/17: Her pain typically stays at a 6/10.   Time 3   Period Weeks   Status Partially Met     OT SHORT TERM GOAL #5   Title patient will decrease fascial restrictions to mod amount to increase ability to complete tasks at shoulder with less difficulty.   Time 3   Period Weeks   Status On-going           OT Long Term Goals - 04/11/17 1344      OT LONG TERM GOAL #1   Title Patient will return to highest  level of independence when using LUE for all daily and work related tasks.    Time  6   Period Weeks   Status On-going     OT LONG TERM GOAL #2   Title Patient will decrease fascial restrictions to min amount or less in order to increase functional reaching ability.   Time 6   Period Weeks   Status On-going     OT LONG TERM GOAL #3   Title Patient will decrease pain level in LUE during work activities to a 3/10 or less.   Time 6   Period Weeks   Status On-going     OT LONG TERM GOAL #4   Title Patient will increase A/ROM to [facility] to increase ability to hook and unhook bra.   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   Time 6   Period Weeks   Status On-going               Plan - 05/30/17 1201    Clinical Impression Statement A: Began treatment with heat and ended with iontophoresis to decrease pain. Measurements taken today for doctor's appointment on 05/31/17. Pt's progress has stayed relatively the same except with an increase in A/ROM shoulder flexion. Completed ball on the wall and overhead lacing with VC needed for form and technique.   Plan P: Continue with heat and iontophoresis. Follow up on Dr. appt from 05/31/17. Complete shoulder stretches if needed.      Patient will benefit from skilled therapeutic intervention in order to improve the following deficits and impairments:  Pain, Decreased range of motion, Decreased strength, Increased fascial restricitons, Impaired UE functional use  Visit Diagnosis: Acute pain of left shoulder  Other symptoms and signs involving the musculoskeletal system  Stiffness of left shoulder, not elsewhere classified    Problem List Patient Active Problem List   Diagnosis Date Noted  . Dyspepsia and disorder of function of stomach   . Constipation 02/15/2017  . Dysphagia 02/15/2017  . Encounter  for screening colonoscopy 02/15/2017  . Arthritis of knee, degenerative 03/09/2014  . Knee pain 08/21/2011  . Stiffness of joint, not elsewhere classified, lower leg 08/21/2011  . Medial meniscus, posterior horn derangement 08/02/2011  . Torn meniscus 07/19/2011  . HIP PAIN 02/16/2009    Luther Hearing, OT Student [phone] 05/30/2017, 12:06 PM  [facility] [address] [place], [place], [address] Phone: [phone]   Fax:  [phone]  Name: Carla Wise MRN: [id number] Date of Birth: [birth year]   Note reviewed by clinical instructor and accurately reflects treatment session.   Ailene Ravel, OTR/L,CBIS  [phone]

## 2017-05-31 ENCOUNTER — Encounter: Payer: Self-pay | Admitting: Orthopedic Surgery

## 2017-05-31 ENCOUNTER — Telehealth: Payer: Self-pay | Admitting: *Deleted

## 2017-05-31 ENCOUNTER — Ambulatory Visit (INDEPENDENT_AMBULATORY_CARE_PROVIDER_SITE_OTHER): Payer: BLUE CROSS/BLUE SHIELD | Admitting: Orthopedic Surgery

## 2017-05-31 DIAGNOSIS — M7502 Adhesive capsulitis of left shoulder: Secondary | ICD-10-CM

## 2017-05-31 NOTE — Patient Instructions (Signed)
We will call you with  pre-op information

## 2017-05-31 NOTE — Telephone Encounter (Signed)
No precert required for CPT 23700 per [facility] surgery scheduled for 06/06/17. Patient notified of pre-op appointment date and time.

## 2017-05-31 NOTE — Addendum Note (Signed)
Addended by: Carole Civil on: 05/31/2017 09:58 AM   Modules accepted: Orders, SmartSet

## 2017-05-31 NOTE — Progress Notes (Signed)
Patient ID: Carla Wise, female   DOB: 21-Feb-1966, 51 y.o.   MRN: [id number]  Chief Complaint  Patient presents with  . Follow-up    2 week recheck on left shoulder.    51 year old female with one year plus pain stiffness left shoulder underwent cortisone injection subacromial space, intra-articular injection of the shoulder joint for adhesive capsulitis osteoarthritis and has not made any improvement with 4 months of physical therapy including prednisone, diclofenac and hydrocodone. She is not progressing she is actually not able to sleep at night. Her range of motion is actually declining again after some mild improvement initially.  She agrees that at this point manipulation would probably be the best option      Review of Systems  Constitutional: Negative.   HENT: Negative.   Respiratory: Negative for shortness of breath.   Cardiovascular: Negative for chest pain.  Skin: Negative.   Neurological: Negative for tingling, tremors, sensory change and focal weakness.  All other systems reviewed and are negative.   Past Medical History:  Diagnosis Date  . Constipation   . Gastric adenoma 02/2017  . PONV (postoperative nausea and vomiting)     Past Surgical History:  Procedure Laterality Date  . ESOPHAGOGASTRODUODENOSCOPY N/A 03/01/2017   Procedure: ESOPHAGOGASTRODUODENOSCOPY (EGD);  Surgeon: Danie Binder, MD;  [facility];  Service: Endoscopy;  Laterality: N/A;  . FOOT SURGERY  2003  . KNEE ARTHROSCOPY W/ MENISCECTOMY     left knee  . SAVORY DILATION N/A 03/01/2017   Procedure: SAVORY DILATION;  Surgeon: Danie Binder, MD;  [facility];  Service: Endoscopy;  Laterality: N/A;    PHYSICAL EXAM  There were no vitals taken for this visit. Physical Exam  Constitutional: She is oriented to person, place, and time. She appears well-developed and well-nourished.  Neurological: She is alert and oriented to person, place, and time.  Psychiatric: She has  a normal mood and affect.  Nursing note and vitals reviewed. Left Shoulder Exam   Comments:  In the office I measured her external rotation of 0 at the arm at her side and then abduction 45 and passive motion was the same flexion was 70  Right Shoulder Exam   Range of Motion  Normal right shoulder ROM  Muscle Strength  Normal right shoulder strength    Right Shoulder Exam  Right shoulder exam is normal.  Tenderness  The patient is experiencing no tenderness.    Range of Motion  The patient has normal right shoulder ROM.  Muscle Strength  The patient has normal right shoulder strength.  Tests  Apprehension: negative  Other  Erythema: absent Sensation: normal Pulse: present   Left Shoulder Exam   Tenderness  The patient is experiencing tenderness in the acromion (Posterior shoulder joint anterior shoulder joint deltoid insertion lateral deltoid muscle).  Range of Motion  Active Abduction: abnormal  Passive Abduction: abnormal  Extension: abnormal  Forward Flexion: abnormal  External Rotation: abnormal  Internal Rotation 0 degrees: abnormal  Internal Rotation 90 degrees: abnormal   Muscle Strength  Abduction: 4/5  Internal Rotation: 5/5  External Rotation: 5/5  Subscapularis: 5/5   Other  Erythema: absent Sensation: normal Pulse: present   Comments:  In the office I measured her external rotation of 0 at the arm at her side and then abduction 45 and passive motion was the same flexion was 70     Encounter Diagnosis  Name Primary?  . Adhesive capsulitis of left shoulder Yes  Plan for manipulation under anesthesia risk of fracture completely explained to the patient  Surgery he will be scheduled for Thursday, June 28 9:21 AM [name], MD 05/31/2017

## 2017-06-03 ENCOUNTER — Ambulatory Visit (HOSPITAL_COMMUNITY): Payer: BLUE CROSS/BLUE SHIELD

## 2017-06-03 NOTE — Patient Instructions (Signed)
Carla Wise  06/03/2017     @PREFPERIOPPHARMACY @   Your procedure is scheduled on  06/06/2017   Report to [facility] at  955  A.M.  Call this number if you have problems the morning of surgery:  [phone]   Remember:  Do not eat food or drink liquids after midnight.  Take these medicines the morning of surgery with A SIP OF WATER   [place]   Do not wear jewelry, make-up or nail polish.  Do not wear lotions, powders, or perfumes, or deoderant.  Do not shave 48 hours prior to surgery.  Men may shave face and neck.  Do not bring valuables to the hospital.  [facility] is not responsible for any belongings or valuables.  Contacts, dentures or bridgework may not be worn into surgery.  Leave your suitcase in the car.  After surgery it may be brought to your room.  For patients admitted to the hospital, discharge time will be determined by your treatment team.  Patients discharged the day of surgery will not be allowed to drive home.   Name and phone number of your driver:   family Special instructions:  None  Please read over the following fact sheets that you were given. Anesthesia Post-op Instructions and Care and Recovery After Surgery       General Anesthesia, Adult General anesthesia is the use of medicines to make a person "go to sleep" (be unconscious) for a medical procedure. General anesthesia is often recommended when a procedure:  Is long.  Requires you to be still or in an unusual position.  Is major and can cause you to lose blood.  Is impossible to do without general anesthesia.  The medicines used for general anesthesia are called general anesthetics. In addition to making you sleep, the medicines:  Prevent pain.  Control your blood pressure.  Relax your muscles.  Tell a health care provider about:  Any allergies you have.  All medicines you are taking, including vitamins, herbs, eye drops, creams, and over-the-counter  medicines.  Any problems you or family members have had with anesthetic medicines.  Types of anesthetics you have had in the past.  Any bleeding disorders you have.  Any surgeries you have had.  Any medical conditions you have.  Any history of heart or lung conditions, such as heart failure, sleep apnea, or chronic obstructive pulmonary disease (COPD).  Whether you are pregnant or may be pregnant.  Whether you use tobacco, alcohol, marijuana, or street drugs.  Any history of Armed forces logistics/support/administrative officer.  Any history of depression or anxiety. What are the risks? Generally, this is a safe procedure. However, problems may occur, including:  Allergic reaction to anesthetics.  Lung and heart problems.  Inhaling food or liquids from your stomach into your lungs (aspiration).  Injury to nerves.  Waking up during your procedure and being unable to move (rare).  Extreme agitation or a state of mental confusion (delirium) when you wake up from the anesthetic.  Air in the bloodstream, which can lead to stroke.  These problems are more likely to develop if you are having a major surgery or if you have an advanced medical condition. You can prevent some of these complications by answering all of your health care provider's questions thoroughly and by following all pre-procedure instructions. General anesthesia can cause side effects, including:  Nausea or vomiting  A sore throat from the breathing tube.  Feeling cold or shivery.  Feeling tired, washed out, or achy.  Sleepiness or drowsiness.  Confusion or agitation.  What happens before the procedure? Staying hydrated Follow instructions from your health care provider about hydration, which may include:  Up to 2 hours before the procedure - you may continue to drink clear liquids, such as water, clear fruit juice, black coffee, and plain tea.  Eating and drinking restrictions Follow instructions from your health care provider  about eating and drinking, which may include:  8 hours before the procedure - stop eating heavy meals or foods such as meat, fried foods, or fatty foods.  6 hours before the procedure - stop eating light meals or foods, such as toast or cereal.  6 hours before the procedure - stop drinking milk or drinks that contain milk.  2 hours before the procedure - stop drinking clear liquids.  Medicines  Ask your health care provider about: ? Changing or stopping your regular medicines. This is especially important if you are taking diabetes medicines or blood thinners. ? Taking medicines such as aspirin and ibuprofen. These medicines can thin your blood. Do not take these medicines before your procedure if your health care provider instructs you not to. ? Taking new dietary supplements or medicines. Do not take these during the week before your procedure unless your health care provider approves them.  If you are told to take a medicine or to continue taking a medicine on the day of the procedure, take the medicine with sips of water. General instructions   Ask if you will be going home the same day, the following day, or after a longer hospital stay. ? Plan to have someone take you home. ? Plan to have someone stay with you for the first 24 hours after you leave the hospital or clinic.  For 3-6 weeks before the procedure, try not to use any tobacco products, such as cigarettes, chewing tobacco, and e-cigarettes.  You may brush your teeth on the morning of the procedure, but make sure to spit out the toothpaste. What happens during the procedure?  You will be given anesthetics through a mask and through an IV tube in one of your veins.  You may receive medicine to help you relax (sedative).  As soon as you are asleep, a breathing tube may be used to help you breathe.  An anesthesia specialist will stay with you throughout the procedure. He or she will help keep you comfortable and safe by  continuing to give you medicines and adjusting the amount of medicine that you get. He or she will also watch your blood pressure, pulse, and oxygen levels to make sure that the anesthetics do not cause any problems.  If a breathing tube was used to help you breathe, it will be removed before you wake up. The procedure may vary among health care providers and hospitals. What happens after the procedure?  You will wake up, often slowly, after the procedure is complete, usually in a recovery area.  Your blood pressure, heart rate, breathing rate, and blood oxygen level will be monitored until the medicines you were given have worn off.  You may be given medicine to help you calm down if you feel anxious or agitated.  If you will be going home the same day, your health care provider may check to make sure you can stand, drink, and urinate.  Your health care providers will treat your pain and side effects before you go home.  Do not drive for  24 hours if you received a sedative.  You may: ? Feel nauseous and vomit. ? Have a sore throat. ? Have mental slowness. ? Feel cold or shivery. ? Feel sleepy. ? Feel tired. ? Feel sore or achy, even in parts of your body where you did not have surgery. This information is not intended to replace advice given to you by your health care provider. Make sure you discuss any questions you have with your health care provider. Document Released: 03/04/2008 Document Revised: 05/08/2016 Document Reviewed: 11/10/2015 Elsevier Interactive Patient Education  2018 [place] Anesthesia, Adult, Care After These instructions provide you with information about caring for yourself after your procedure. Your health care provider may also give you more specific instructions. Your treatment has been planned according to current medical practices, but problems sometimes occur. Call your health care provider if you have any problems or questions after your  procedure. What can I expect after the procedure? After the procedure, it is common to have:  Vomiting.  A sore throat.  Mental slowness.  It is common to feel:  Nauseous.  Cold or shivery.  Sleepy.  Tired.  Sore or achy, even in parts of your body where you did not have surgery.  Follow these instructions at home: For at least 24 hours after the procedure:  Do not: ? Participate in activities where you could fall or become injured. ? Drive. ? Use heavy machinery. ? Drink alcohol. ? Take sleeping pills or medicines that cause drowsiness. ? Make important decisions or sign legal documents. ? Take care of children on your own.  Rest. Eating and drinking  If you vomit, drink water, juice, or soup when you can drink without vomiting.  Drink enough fluid to keep your urine clear or pale yellow.  Make sure you have little or no nausea before eating solid foods.  Follow the diet recommended by your health care provider. General instructions  Have a responsible adult stay with you until you are awake and alert.  Return to your normal activities as told by your health care provider. Ask your health care provider what activities are safe for you.  Take over-the-counter and prescription medicines only as told by your health care provider.  If you smoke, do not smoke without supervision.  Keep all follow-up visits as told by your health care provider. This is important. Contact a health care provider if:  You continue to have nausea or vomiting at home, and medicines are not helpful.  You cannot drink fluids or start eating again.  You cannot urinate after 8-12 hours.  You develop a skin rash.  You have fever.  You have increasing redness at the site of your procedure. Get help right away if:  You have difficulty breathing.  You have chest pain.  You have unexpected bleeding.  You feel that you are having a life-threatening or urgent problem. This  information is not intended to replace advice given to you by your health care provider. Make sure you discuss any questions you have with your health care provider. Document Released: 03/04/2001 Document Revised: 04/30/2016 Document Reviewed: 11/10/2015 Elsevier Interactive Patient Education  [employer].

## 2017-06-04 ENCOUNTER — Encounter (HOSPITAL_COMMUNITY)
Admission: RE | Admit: 2017-06-04 | Discharge: 2017-06-04 | Disposition: A | Discharge status: Home / Self Care | Payer: BLUE CROSS/BLUE SHIELD | Source: Ambulatory Visit | Attending: Orthopedic Surgery | Admitting: Orthopedic Surgery

## 2017-06-04 ENCOUNTER — Encounter (HOSPITAL_COMMUNITY): Payer: Self-pay

## 2017-06-04 DIAGNOSIS — Z881 Allergy status to other antibiotic agents status: Secondary | ICD-10-CM | POA: Diagnosis not present

## 2017-06-04 DIAGNOSIS — M25412 Effusion, left shoulder: Secondary | ICD-10-CM | POA: Diagnosis not present

## 2017-06-04 DIAGNOSIS — M7502 Adhesive capsulitis of left shoulder: Secondary | ICD-10-CM | POA: Diagnosis not present

## 2017-06-04 DIAGNOSIS — F17210 Nicotine dependence, cigarettes, uncomplicated: Secondary | ICD-10-CM | POA: Diagnosis not present

## 2017-06-04 DIAGNOSIS — M65812 Other synovitis and tenosynovitis, left shoulder: Secondary | ICD-10-CM | POA: Diagnosis not present

## 2017-06-04 DIAGNOSIS — M19012 Primary osteoarthritis, left shoulder: Secondary | ICD-10-CM | POA: Diagnosis not present

## 2017-06-04 LAB — CBC WITH DIFFERENTIAL/PLATELET
Basophils Absolute: 0.1 10*3/uL (ref 0.0–0.1)
Basophils Relative: 1 %
Eosinophils Absolute: 0.1 10*3/uL (ref 0.0–0.7)
Eosinophils Relative: 2 %
HCT: 45.4 % (ref 36.0–46.0)
Hemoglobin: 15.2 g/dL — ABNORMAL HIGH (ref 12.0–15.0)
Lymphocytes Relative: 25 %
Lymphs Abs: 1.7 10*3/uL (ref 0.7–4.0)
MCH: 30.2 pg (ref 26.0–34.0)
MCHC: 33.5 g/dL (ref 30.0–36.0)
MCV: 90.1 fL (ref 78.0–100.0)
Monocytes Absolute: 0.3 10*3/uL (ref 0.1–1.0)
Monocytes Relative: 4 %
Neutro Abs: 4.9 10*3/uL (ref 1.7–7.7)
Neutrophils Relative %: 68 %
Platelets: 244 10*3/uL (ref 150–400)
RBC: 5.04 MIL/uL (ref 3.87–5.11)
RDW: 14 % (ref 11.5–15.5)
WBC: 7.1 10*3/uL (ref 4.0–10.5)

## 2017-06-04 LAB — BASIC METABOLIC PANEL
Anion gap: 10 (ref 5–15)
BUN: 9 mg/dL (ref 6–20)
CO2: 28 mmol/L (ref 22–32)
Calcium: 9.8 mg/dL (ref 8.9–10.3)
Chloride: 101 mmol/L (ref 101–111)
Creatinine, Ser: 0.69 mg/dL (ref 0.44–1.00)
GFR calc Af Amer: >60 mL/min (ref >60)
GFR calc non Af Amer: >60 mL/min (ref >60)
Glucose, Bld: 85 mg/dL (ref 65–99)
Potassium: 3.5 mmol/L (ref 3.5–5.1)
Sodium: 139 mmol/L (ref 135–145)

## 2017-06-05 ENCOUNTER — Encounter (HOSPITAL_COMMUNITY): Payer: Self-pay | Admitting: *Deleted

## 2017-06-05 NOTE — H&P (Signed)
Carla Wise is an 51 y.o. female.    Chief complaint is pain left shoulder  History of present illness I started seeing Carla Wise February of this year for atraumatic onset of pain left shoulder. She was treated with physical therapy subacromial injections prednisone nonsteroidal anti-inflammatory medications and oral opioid medication. She did not make significant progress.  From my original note Carla Wise is a 51 y.o. female.  [age] year old female presents with atraumatic onset of pain in her left shoulder and left arm. She perceives pain more in her arm on the lateral side near the insertion of the deltoid associated loss of range of motion of the shoulder painful abduction and forward elevation of the shoulder with no history of trauma. She also notices some stiffness in the left shoulder   Past Medical History:  Diagnosis Date  . Constipation   . Gastric adenoma 02/2017  . PONV (postoperative nausea and vomiting)     Past Surgical History:  Procedure Laterality Date  . ESOPHAGOGASTRODUODENOSCOPY N/A 03/01/2017   Procedure: ESOPHAGOGASTRODUODENOSCOPY (EGD);  Surgeon: Danie Binder, MD;  [facility];  Service: Endoscopy;  Laterality: N/A;  . FOOT SURGERY Left 2003   hammer toe- great toe  . KNEE ARTHROSCOPY W/ MENISCECTOMY     left knee  . SAVORY DILATION N/A 03/01/2017   Procedure: SAVORY DILATION;  Surgeon: Danie Binder, MD;  [facility]: [facility];  Service: Endoscopy;  Laterality: N/A;    Family History  Problem Relation Age of Onset  . Heart disease Unknown   . Cancer Unknown   . Colon cancer Neg Hx    Social History:  reports that she has been smoking Cigarettes.  She has a 12.50 pack-year smoking history. She has never used smokeless tobacco. She reports that she does not drink alcohol or use drugs.  Allergies:  Allergies  Allergen Reactions  . Azithromycin     Patient gets worst whenever she takes a Z-Pak  . Levaquin  [Levofloxacin In D5w]     Pt states she gets worst.     No prescriptions prior to admission.    Results for orders placed or performed during the hospital encounter of 06/04/17 (from the past 48 hour(s))  CBC with Differential/Platelet     Status: Abnormal   Collection Time: 06/04/17  2:26 PM  Result Value Ref Range   WBC 7.1 4.0 - 10.5 K/uL   RBC 5.04 3.87 - 5.11 MIL/uL   Hemoglobin 15.2 (H) 12.0 - 15.0 g/dL   HCT 45.4 36.0 - 46.0 %   MCV 90.1 78.0 - 100.0 fL   MCH 30.2 26.0 - 34.0 pg   MCHC 33.5 30.0 - 36.0 g/dL   RDW 14.0 11.5 - 15.5 %   Platelets 244 150 - 400 K/uL   Neutrophils Relative % 68 %   Neutro Abs 4.9 1.7 - 7.7 K/uL   Lymphocytes Relative 25 %   Lymphs Abs 1.7 0.7 - 4.0 K/uL   Monocytes Relative 4 %   Monocytes Absolute 0.3 0.1 - 1.0 K/uL   Eosinophils Relative 2 %   Eosinophils Absolute 0.1 0.0 - 0.7 K/uL   Basophils Relative 1 %   Basophils Absolute 0.1 0.0 - 0.1 K/uL  Basic metabolic panel     Status: None   Collection Time: 06/04/17  2:26 PM  Result Value Ref Range   Sodium 139 135 - 145 mmol/L   Potassium 3.5 3.5 - 5.1 mmol/L   Chloride  101 101 - 111 mmol/L   CO2 28 22 - 32 mmol/L   Glucose, Bld 85 65 - 99 mg/dL   BUN 9 6 - 20 mg/dL   Creatinine, Ser 0.69 0.44 - 1.00 mg/dL   Calcium 9.8 8.9 - 10.3 mg/dL   GFR calc non Af Amer >60 >60 mL/min   GFR calc Af Amer >60 >60 mL/min    Comment: (NOTE) The eGFR has been calculated using the CKD EPI equation. This calculation has not been validated in all clinical situations. eGFR's persistently <60 mL/min signify possible Chronic Kidney Disease.    Anion gap 10 5 - 15   No results found.  Review of Systems  All other systems reviewed and are negative.   There were no vitals taken for this visit. Physical Exam  Constitutional: She is oriented to person, place, and time. She appears well-nourished.  Eyes: Right eye exhibits no discharge. Left eye exhibits no discharge. No scleral icterus.  Neck:  Neck supple. No JVD present. No tracheal deviation present.  Cardiovascular: Intact distal pulses.   Respiratory: Effort normal. No stridor.  GI: Soft. She exhibits no distension.  Neurological: She is alert and oriented to person, place, and time. She has normal reflexes. She exhibits normal muscle tone. Coordination normal.  Skin: Skin is warm and dry. No rash noted. No erythema. No pallor.  Psychiatric: She has a normal mood and affect. Her behavior is normal. Thought content normal.    Left shoulder She has mild tenderness globally around the shoulder which is worse with range of motion she has limited passive range of motion in all planes. Shoulder is stable as can be tested for motor exam is normal skin is intact pulses are good sensation is normal  Assessment/Plan An MRI and it did not show any rotator cuff tear  I report was obtained  IMPRESSION: 1. Mild rotator cuff tendinopathy/tendinosis. No partial or full-thickness tear. 2. Intact long head biceps tendon and glenoid labrum. 3. Early, age advanced, glenohumeral joint degenerative changes, joint effusion, synovitis and reactive marrow edema in the humeral head. Could not exclude an inflammatory arthropathy. 4. Moderate lateral downsloping of the type 1 acromion.     Electronically Signed   By: Marijo Sanes M.D.   On: 03/12/2017 10:00   Due to lack of progress ongoing pain and the patient just really being miserable she is agreed to undergo manipulation under anesthesia left shoulder with a major risk factor of fracture and recurrent stiffness     [name], MD 06/05/2017, 5:19 PM

## 2017-06-06 ENCOUNTER — Ambulatory Visit (HOSPITAL_COMMUNITY): Payer: BLUE CROSS/BLUE SHIELD | Admitting: Anesthesiology

## 2017-06-06 ENCOUNTER — Encounter (HOSPITAL_COMMUNITY): Payer: BLUE CROSS/BLUE SHIELD

## 2017-06-06 ENCOUNTER — Ambulatory Visit (HOSPITAL_COMMUNITY)
Admission: RE | Admit: 2017-06-06 | Discharge: 2017-06-06 | Disposition: A | Discharge status: Home / Self Care | Payer: BLUE CROSS/BLUE SHIELD | Source: Ambulatory Visit | Attending: Orthopedic Surgery | Admitting: Orthopedic Surgery

## 2017-06-06 ENCOUNTER — Encounter (HOSPITAL_COMMUNITY)
Admission: RE | Disposition: A | Discharge status: Home / Self Care | Payer: Self-pay | Source: Ambulatory Visit | Attending: Orthopedic Surgery

## 2017-06-06 ENCOUNTER — Encounter (HOSPITAL_COMMUNITY): Payer: Self-pay

## 2017-06-06 DIAGNOSIS — M65812 Other synovitis and tenosynovitis, left shoulder: Secondary | ICD-10-CM | POA: Diagnosis not present

## 2017-06-06 DIAGNOSIS — Z881 Allergy status to other antibiotic agents status: Secondary | ICD-10-CM | POA: Diagnosis not present

## 2017-06-06 DIAGNOSIS — M25412 Effusion, left shoulder: Secondary | ICD-10-CM | POA: Diagnosis not present

## 2017-06-06 DIAGNOSIS — M7502 Adhesive capsulitis of left shoulder: Secondary | ICD-10-CM | POA: Diagnosis not present

## 2017-06-06 DIAGNOSIS — F17210 Nicotine dependence, cigarettes, uncomplicated: Secondary | ICD-10-CM | POA: Insufficient documentation

## 2017-06-06 DIAGNOSIS — M19012 Primary osteoarthritis, left shoulder: Secondary | ICD-10-CM | POA: Insufficient documentation

## 2017-06-06 HISTORY — PX: EXAM UNDER ANESTHESIA WITH MANIPULATION OF SHOULDER: SHX5817

## 2017-06-06 SURGERY — EXAM UNDER ANESTHESIA, SHOULDER, WITH MANIPULATION
Anesthesia: General | Site: Shoulder | Laterality: Left

## 2017-06-06 MED ORDER — IBUPROFEN 800 MG PO TABS: 800.0000 mg | ORAL_TABLET | Freq: Three times a day (TID) | ORAL | 1 refills | Status: DC | PRN

## 2017-06-06 MED ORDER — FENTANYL CITRATE (PF) 100 MCG/2ML IJ SOLN
25.0000 ug | INTRAMUSCULAR | Status: DC | PRN
  Administered 2017-06-06 (×4): 50 ug via INTRAVENOUS
  Filled 2017-06-06 (×2): qty 2

## 2017-06-06 MED ORDER — FENTANYL CITRATE (PF) 100 MCG/2ML IJ SOLN
INTRAMUSCULAR | Status: AC
  Filled 2017-06-06: qty 2

## 2017-06-06 MED ORDER — OXYCODONE HCL 5 MG/5ML PO SOLN: 5.0000 mg | Freq: Once | ORAL | Status: AC | PRN

## 2017-06-06 MED ORDER — ONDANSETRON 4 MG PO TBDP
4.0000 mg | ORAL_TABLET | Freq: Once | ORAL | Status: AC
  Administered 2017-06-06: 4 mg via ORAL
  Filled 2017-06-06: qty 1

## 2017-06-06 MED ORDER — METOCLOPRAMIDE HCL 5 MG/ML IJ SOLN
INTRAMUSCULAR | Status: DC | PRN
  Administered 2017-06-06: 10 mg via INTRAVENOUS

## 2017-06-06 MED ORDER — HYDROCODONE-ACETAMINOPHEN 10-325 MG PO TABS: 1.0000 | ORAL_TABLET | ORAL | 0 refills | Status: DC | PRN

## 2017-06-06 MED ORDER — PROPOFOL 10 MG/ML IV BOLUS
INTRAVENOUS | Status: AC
  Filled 2017-06-06: qty 20

## 2017-06-06 MED ORDER — MIDAZOLAM HCL 2 MG/2ML IJ SOLN
1.0000 mg | Freq: Once | INTRAMUSCULAR | Status: AC | PRN
  Administered 2017-06-06: 2 mg via INTRAVENOUS
  Filled 2017-06-06: qty 2

## 2017-06-06 MED ORDER — LACTATED RINGERS IV SOLN
INTRAVENOUS | Status: DC
  Administered 2017-06-06: 09:00:00 via INTRAVENOUS

## 2017-06-06 MED ORDER — PROMETHAZINE HCL 12.5 MG PO TABS: 12.5000 mg | ORAL_TABLET | Freq: Four times a day (QID) | ORAL | 0 refills | Status: DC | PRN

## 2017-06-06 MED ORDER — FENTANYL CITRATE (PF) 100 MCG/2ML IJ SOLN
INTRAMUSCULAR | Status: DC | PRN
Start: 2017-06-06 — End: 2017-06-06
  Administered 2017-06-06 (×2): 100 ug via INTRAVENOUS

## 2017-06-06 MED ORDER — OXYCODONE HCL 5 MG PO TABS
5.0000 mg | ORAL_TABLET | Freq: Once | ORAL | Status: AC | PRN
  Administered 2017-06-06: 5 mg via ORAL
  Filled 2017-06-06: qty 1

## 2017-06-06 MED ORDER — PROPOFOL 10 MG/ML IV BOLUS
INTRAVENOUS | Status: DC | PRN
  Administered 2017-06-06: 200 mg via INTRAVENOUS

## 2017-06-06 MED ORDER — PROMETHAZINE HCL 25 MG/ML IJ SOLN
6.2500 mg | Freq: Once | INTRAMUSCULAR | Status: AC
  Administered 2017-06-06: 6.25 mg via INTRAVENOUS

## 2017-06-06 MED ORDER — METOCLOPRAMIDE HCL 5 MG/ML IJ SOLN
INTRAMUSCULAR | Status: AC
  Filled 2017-06-06: qty 2

## 2017-06-06 MED ORDER — CHLORHEXIDINE GLUCONATE 4 % EX LIQD
60.0000 mL | Freq: Once | CUTANEOUS | Status: DC
Start: 2017-06-06 — End: 2017-06-06

## 2017-06-06 MED ORDER — TIZANIDINE HCL 4 MG PO TABS: 4.0000 mg | ORAL_TABLET | Freq: Three times a day (TID) | ORAL | 1 refills | Status: DC

## 2017-06-06 MED ORDER — PROMETHAZINE HCL 25 MG/ML IJ SOLN
INTRAMUSCULAR | Status: AC
  Filled 2017-06-06: qty 1

## 2017-06-06 MED ORDER — DEXAMETHASONE SODIUM PHOSPHATE 4 MG/ML IJ SOLN
INTRAMUSCULAR | Status: DC | PRN
  Administered 2017-06-06: 4 mg via INTRAVENOUS

## 2017-06-06 SURGICAL SUPPLY — 3 items
BAG HAMPER (MISCELLANEOUS) ×2 IMPLANT
KIT ROOM TURNOVER APOR (KITS) ×2 IMPLANT
SLING ARM FOAM STRAP MED (SOFTGOODS) ×2 IMPLANT

## 2017-06-06 NOTE — Brief Op Note (Signed)
06/06/2017  10:44 AM  PATIENT:  [name]  51 y.o. female  PRE-OPERATIVE DIAGNOSIS:  adhesive capsulitis left shoulder  POST-OPERATIVE DIAGNOSIS:  adhesive capsulitis left shoulder  PROCEDURE:  Procedure(s) with comments: EXAM UNDER ANESTHESIA WITH MANIPULATION OF SHOULDER (Left) - full relaxation 23700   SURGEON:  Surgeon(s) and Role:    Carole Civil, MD - Primary  PHYSICIAN ASSISTANT:   ASSISTANTS: none   ANESTHESIA:   general  EBL:  No intake/output data recorded.  BLOOD ADMINISTERED:none  DRAINS: none   LOCAL MEDICATIONS USED:  NONE  SPECIMEN:  No Specimen  DISPOSITION OF SPECIMEN:  N/A  COUNTS:  YES  TOURNIQUET:  * No tourniquets in log *  DICTATION: .Dragon Dictation  PLAN OF CARE: Discharge to home after PACU  PATIENT DISPOSITION:  PACU - hemodynamically stable.   Delay start of Pharmacological VTE agent (>24hrs) due to surgical blood loss or risk of bleeding: not applicable  [id number]

## 2017-06-06 NOTE — Anesthesia Procedure Notes (Signed)
Procedure Name: LMA Insertion Date/Time: 06/06/2017 10:30 AM Performed by: Gilmer Mor R Pre-anesthesia Checklist: Patient identified, Emergency Drugs available, Suction available and Patient being monitored Patient Re-evaluated:Patient Re-evaluated prior to inductionOxygen Delivery Method: Circle system utilized Preoxygenation: Pre-oxygenation with 100% oxygen Intubation Type: IV induction Ventilation: Mask ventilation without difficulty LMA: LMA inserted LMA Size: 4.0 and 3.0 Number of attempts: 1 Placement Confirmation: positive ETCO2 and breath sounds checked- equal and bilateral Dental Injury: Teeth and Oropharynx as per pre-operative assessment

## 2017-06-06 NOTE — Op Note (Signed)
Operative report  10:45 AM 06/06/2017  Preop diagnosis adhesive capsulitis left shoulder  Postop diagnosis same  Procedure manipulation and examination under anesthesia-[id number]  Surgeon Aline Brochure  Anesthesia Gen.  Findings once the patient was under anesthesia her external rotation with her arm at her side was 15 her abduction was 75 and her flexion was 90  At the end of the procedure her abduction was 120 her external rotation was 50 and her forward elevation was 170  The patient was identified in the preop area and the surgical site was confirmed marked and chart review was completed  She was taken to surgery for general anesthesia  Timeout was completed  I first checked range of motion under anesthesia and is recorded above  I then manipulated the shoulder into forward flexion then abduction and then external rotation. Audible crepitance was noted in each manipulation.  The patient was placed in a sling extubated and taken to recovery room in stable condition

## 2017-06-06 NOTE — Interval H&P Note (Signed)
History and Physical Interval Note:  06/06/2017 9:32 AM  Carla Wise  has presented today for surgery, with the diagnosis of adhesive capsulitis left shoulder  The various methods of treatment have been discussed with the patient and family. After consideration of risks, benefits and other options for treatment, the patient has consented to  Procedure(s) with comments: EXAM UNDER ANESTHESIA WITH MANIPULATION OF SHOULDER (Left) - full relaxation as a surgical intervention .  The patient's history has been reviewed, patient examined, no change in status, stable for surgery.  I have reviewed the patient's chart and labs.  Questions were answered to the patient's satisfaction.     [name]

## 2017-06-06 NOTE — Anesthesia Postprocedure Evaluation (Signed)
Anesthesia Post Note  Patient: Carla Wise  Procedure(s) Performed: Procedure(s) (LRB): EXAM UNDER ANESTHESIA WITH MANIPULATION OF SHOULDER (Left)  Patient location during evaluation: PACU Anesthesia Type: General Level of consciousness: awake and alert Pain management: pain level controlled Vital Signs Assessment: post-procedure vital signs reviewed and stable Respiratory status: spontaneous breathing, nonlabored ventilation, respiratory function stable and patient connected to nasal cannula oxygen Cardiovascular status: blood pressure returned to baseline and stable Postop Assessment: no signs of nausea or vomiting Anesthetic complications: no     Last Vitals:  Vitals:   06/06/17 1100 06/06/17 1115  BP: 135/86 135/88  Pulse: 91 86  Resp: 13 15  Temp:      Last Pain:  Vitals:   06/06/17 1120  TempSrc:   PainSc: Asleep                 Jaxan Michel

## 2017-06-06 NOTE — Anesthesia Preprocedure Evaluation (Signed)
Anesthesia Evaluation  Patient identified by MRN, date of birth, ID band Patient awake    History of Anesthesia Complications (+) PONV  Airway Mallampati: I  TM Distance: >3 FB Neck ROM: Full    Dental  (+) Teeth Intact   Pulmonary Current Smoker,    Pulmonary exam normal breath sounds clear to auscultation       Cardiovascular negative cardio ROS Normal cardiovascular exam Rhythm:Regular Rate:Normal     Neuro/Psych negative neurological ROS  negative psych ROS   GI/Hepatic Neg liver ROS,   Endo/Other  negative endocrine ROS  Renal/GU negative Renal ROS     Musculoskeletal  (+) Arthritis ,   Abdominal Normal abdominal exam  (+)   Peds  Hematology negative hematology ROS (+)   Anesthesia Other Findings   Reproductive/Obstetrics                             Anesthesia Physical Anesthesia Plan  ASA: II  Anesthesia Plan: General   Post-op Pain Management:    Induction: Intravenous  PONV Risk Score and Plan: Ondansetron and Propofol  Airway Management Planned: LMA  Additional Equipment:   Intra-op Plan:   Post-operative Plan: Extubation in OR  Informed Consent: I have reviewed the patients History and Physical, chart, labs and discussed the procedure including the risks, benefits and alternatives for the proposed anesthesia with the patient or authorized representative who has indicated his/her understanding and acceptance.     Plan Discussed with: CRNA  Anesthesia Plan Comments:         Anesthesia Quick Evaluation

## 2017-06-06 NOTE — Transfer of Care (Signed)
Immediate Anesthesia Transfer of Care Note  Patient: Carla Wise  Procedure(s) Performed: Procedure(s) with comments: EXAM UNDER ANESTHESIA WITH MANIPULATION OF SHOULDER (Left) - full relaxation  Patient Location: PACU  Anesthesia Type:General  Level of Consciousness: awake, alert , oriented and patient cooperative  Airway & Oxygen Therapy: Patient Spontanous Breathing and Patient connected to face mask oxygen  Post-op Assessment: Report given to RN, Post -op Vital signs reviewed and stable and Patient moving all extremities X 4  Post vital signs: Reviewed and stable  Last Vitals:  Vitals:   06/06/17 0925 06/06/17 0930  BP: 123/81   Pulse:    Resp: (!) 32 (!) 35  Temp:      Last Pain:  Vitals:   06/06/17 0901  TempSrc: Oral  PainSc: 8       Patients Stated Pain Goal: 4 (99/83/38 2505)  Complications: No apparent anesthesia complications

## 2017-06-07 ENCOUNTER — Encounter (HOSPITAL_COMMUNITY): Payer: Self-pay | Admitting: Orthopedic Surgery

## 2017-06-10 ENCOUNTER — Encounter (HOSPITAL_COMMUNITY): Payer: Self-pay

## 2017-06-10 ENCOUNTER — Ambulatory Visit (HOSPITAL_COMMUNITY): Payer: BLUE CROSS/BLUE SHIELD | Attending: Orthopedic Surgery

## 2017-06-10 DIAGNOSIS — R29898 Other symptoms and signs involving the musculoskeletal system: Secondary | ICD-10-CM | POA: Diagnosis not present

## 2017-06-10 DIAGNOSIS — M25612 Stiffness of left shoulder, not elsewhere classified: Secondary | ICD-10-CM | POA: Insufficient documentation

## 2017-06-10 DIAGNOSIS — M25512 Pain in left shoulder: Secondary | ICD-10-CM | POA: Insufficient documentation

## 2017-06-10 NOTE — Therapy (Signed)
[facility] [place], [place], [address] Phone: [phone]   Fax:  [phone]  Occupational Therapy Treatment  Patient Details  Name: Carla Wise MRN: [id number] Date of Birth: 14-Aug-1966 Referring Provider: [name], MD  Encounter Date: 06/10/2017      OT End of Session - 06/10/17 1617    Visit Number 17   Number of Visits 20   Date for OT Re-Evaluation 07/10/17   Authorization Type [employer]    Authorization Time Period 30 visit limit with 0 used this year.   Authorization - Visit Number 17   Authorization - Number of Visits 30   OT Start Time 1435   OT Stop Time 1515   OT Time Calculation (min) 40 min   Activity Tolerance Patient tolerated treatment well   Behavior During Therapy WFL for tasks assessed/performed      Past Medical History:  Diagnosis Date  . Constipation   . Gastric adenoma 02/2017  . PONV (postoperative nausea and vomiting)     Past Surgical History:  Procedure Laterality Date  . ESOPHAGOGASTRODUODENOSCOPY N/A 03/01/2017   Procedure: ESOPHAGOGASTRODUODENOSCOPY (EGD);  Surgeon: Danie Binder, MD;  [facility];  Service: Endoscopy;  Laterality: N/A;  . EXAM UNDER ANESTHESIA WITH MANIPULATION OF SHOULDER Left 06/06/2017   Procedure: EXAM UNDER ANESTHESIA WITH MANIPULATION OF SHOULDER;  Surgeon: Carole Civil, MD;  Location: [facility];  Service: Orthopedics;  Laterality: Left;  full relaxation  . FOOT SURGERY Left 2003   hammer toe- great toe  . KNEE ARTHROSCOPY W/ MENISCECTOMY     left knee  . SAVORY DILATION N/A 03/01/2017   Procedure: SAVORY DILATION;  Surgeon: Danie Binder, MD;  [facility]: [facility];  Service: Endoscopy;  Laterality: N/A;    There were no vitals filed for this visit.      Subjective Assessment - 06/10/17 1615    Subjective  S: I'm not having as much pain and it is a different kind of pain.   Currently in Pain? Yes   Pain Score 4    Pain  Location Shoulder   Pain Orientation Left   Pain Descriptors / Indicators Sore   Pain Type Acute pain   Pain Radiating Towards N/A   Pain Onset More than a month ago   Pain Frequency Constant   Aggravating Factors  using it   Pain Relieving Factors ice   Effect of Pain on Daily Activities severe   Multiple Pain Sites No            [facility] OT Assessment - 06/10/17 1443      Assessment   Diagnosis Left shoulder adhesive capsulitis     Precautions   Precautions None     AROM   Overall AROM Comments Assessed standing. IR/er adducted.   AROM Assessment Site Shoulder   Right/Left Shoulder Left   Left Shoulder Flexion 124 Degrees  previous: 116   Left Shoulder ABduction 122 Degrees  previous: 103   Left Shoulder Internal Rotation 90 Degrees  previous: same   Left Shoulder External Rotation 58 Degrees  previous: 50     PROM   Overall PROM Comments Assessed supine. IR/er adducted   PROM Assessment Site Shoulder   Right/Left Shoulder Left   Left Shoulder Flexion 141 Degrees  previous: 132   Left Shoulder ABduction 135 Degrees  previous: 96   Left Shoulder Internal Rotation 90 Degrees  previous: 90   Left Shoulder External Rotation  62 Degrees  previouis: 41     Strength   Overall Strength Comments Assessed seated. IR/er adducted   Strength Assessment Site Shoulder   Right/Left Shoulder Left   Left Shoulder Flexion 4+/5  previous: 3-/5   Left Shoulder ABduction 4-/5  previous: 3-/5   Left Shoulder Internal Rotation 4/5  previous: 4-/5   Left Shoulder External Rotation 4-/5  previous: 4-/5             OT Treatments/Exercises (OP) - 06/10/17 1455      Exercises   Exercises Shoulder     Shoulder Exercises: Supine   Protraction PROM;5 reps   Horizontal ABduction PROM;5 reps   External Rotation PROM;5 reps   Internal Rotation PROM;5 reps   Flexion PROM;5 reps   ABduction PROM;5 reps     Shoulder Exercises: ROM/Strengthening   UBE (Upper Arm Bike) 3'  backwards, level 2   Over Head Lace 2'   X to V Arms 10X   Other ROM/Strengthening Exercises PVC pipe slides, flexion 10X, abduction 10X   Other ROM/Strengthening Exercises Y arms with wall lift off; 10X     Shoulder Exercises: Stretch   Cross Chest Stretch 2 reps;20 seconds   Internal Rotation Stretch 2 reps  20 seconds   Wall Stretch - Flexion 60 seconds;1 rep            OT Education - 06/10/17 1617    Education provided No          OT Short Term Goals - 06/10/17 1619      OT SHORT TERM GOAL #1   Title Patient will be educated and independent with HEP to increase functional use of LUE during daily tasks.    Time 3   Period Weeks     OT SHORT TERM GOAL #2   Title Patient will increase P/ROM to [facility] to increase ability to get shirts on and off with less difficulty.   Baseline 05/16/17: She can donn and doff her shirts but with the use of compensatory strategies.   Time 3   Period Weeks   Status Partially Met     OT SHORT TERM GOAL #3   Title Patient will increase LUE strength to 3/5 to increase functional reaching ability to increase ability to brush hair.   Time 3   Period Weeks   Status Achieved     OT SHORT TERM GOAL #4   Title Patient will decrease pain level in LUE to 5/10 during light household tasks.    Baseline 05/16/17: Her pain typically stays at a 6/10.   Time 3   Period Weeks   Status Achieved     OT SHORT TERM GOAL #5   Title patient will decrease fascial restrictions to mod amount to increase ability to complete tasks at shoulder with less difficulty.   Time 3   Period Weeks   Status Achieved           OT Long Term Goals - 06/10/17 1622      OT LONG TERM GOAL #1   Title Patient will return to highest level of independence when using LUE for all daily and work related tasks.    Time 6   Period Weeks   Status On-going     OT LONG TERM GOAL #2   Title Patient will decrease fascial restrictions to min amount or less in order to increase  functional reaching ability.   Time 6   Period Weeks   Status Achieved  OT LONG TERM GOAL #3   Title Patient will decrease pain level in LUE during work activities to a 3/10 or less.   Time 6   Period Weeks   Status On-going     OT LONG TERM GOAL #4   Title Patient will increase A/ROM to [facility] to increase ability to hook and unhook bra.   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   Time 6   Period Weeks   Status Achieved            Plan - 06/10/17 1624    Clinical Impression Statement A: Session focused on reassessment. Pt had manipulation of shoulder done on 06/06/17 and measurements showed and increase in P/ROM and A/ROM. Pt achieved 3 short term goals and partially met 1 goal as well as achieved 2 long term goals with 3 still on-going. Pt still needs to improve A/ROM as well as decrease daily pain. Completed overhead lacing and shoulder stretches with VC needed for form and technique.   Plan P: Progress pt through MD's exercises as tolerable. Complete strengthening supine with 1#, prone A/ROM, scapular theraband exercises.       Patient will benefit from skilled therapeutic intervention in order to improve the following deficits and impairments:  Pain, Decreased range of motion, Decreased strength, Increased fascial restricitons, Impaired UE functional use  Visit Diagnosis: Acute pain of left shoulder  Other symptoms and signs involving the musculoskeletal system  Stiffness of left shoulder, not elsewhere classified    Problem List Patient Active Problem List   Diagnosis Date Noted  . Adhesive capsulitis of left shoulder   . Dyspepsia and disorder of function of stomach   . Constipation 02/15/2017  . Dysphagia 02/15/2017  . Encounter for screening colonoscopy 02/15/2017  . Arthritis of knee, degenerative 03/09/2014  . Knee pain  08/21/2011  . Stiffness of joint, not elsewhere classified, lower leg 08/21/2011  . Medial meniscus, posterior horn derangement 08/02/2011  . Torn meniscus 07/19/2011  . HIP PAIN 02/16/2009    Luther Hearing, OT Student [phone] 06/10/2017, 4:45 PM  [facility] [address] [place], [place], [address] Phone: [phone]   Fax:  [phone]  Name: Carla Wise MRN: [id number] Date of Birth: [birth year]   Note reviewed by clinical instructor and accurately reflects treatment session.  Ailene Ravel, OTR/L,CBIS  [phone]

## 2017-06-13 ENCOUNTER — Encounter (HOSPITAL_COMMUNITY): Payer: Self-pay | Admitting: Occupational Therapy

## 2017-06-13 ENCOUNTER — Ambulatory Visit (HOSPITAL_COMMUNITY): Payer: BLUE CROSS/BLUE SHIELD | Admitting: Occupational Therapy

## 2017-06-13 DIAGNOSIS — M25612 Stiffness of left shoulder, not elsewhere classified: Secondary | ICD-10-CM

## 2017-06-13 DIAGNOSIS — M25512 Pain in left shoulder: Secondary | ICD-10-CM

## 2017-06-13 DIAGNOSIS — R29898 Other symptoms and signs involving the musculoskeletal system: Secondary | ICD-10-CM

## 2017-06-13 NOTE — Therapy (Signed)
[place], [place], [address] Phone: [phone]   Fax:  [phone]  Occupational Therapy Treatment  Patient Details  Name: Carla Wise MRN: [id number] Date of Birth: 1966-02-20 Referring Provider: [name], MD  Encounter Date: 06/13/2017      OT End of Session - 06/13/17 1605    Visit Number 18   Number of Visits 27   Date for OT Re-Evaluation 07/10/17   Authorization Type BCBS    Authorization Time Period 30 visit limit with 0 used this year.   Authorization - Visit Number 18   Authorization - Number of Visits 30   OT Start Time 4825   OT Stop Time 1600   OT Time Calculation (min) 38 min   Activity Tolerance Patient tolerated treatment well   Behavior During Therapy WFL for tasks assessed/performed      Past Medical History:  Diagnosis Date  . Constipation   . Gastric adenoma 02/2017  . PONV (postoperative nausea and vomiting)     Past Surgical History:  Procedure Laterality Date  . ESOPHAGOGASTRODUODENOSCOPY N/A 03/01/2017   Procedure: ESOPHAGOGASTRODUODENOSCOPY (EGD);  Surgeon: Danie Binder, MD;  [facility];  Service: Endoscopy;  Laterality: N/A;  . EXAM UNDER ANESTHESIA WITH MANIPULATION OF SHOULDER Left 06/06/2017   Procedure: EXAM UNDER ANESTHESIA WITH MANIPULATION OF SHOULDER;  Surgeon: Carole Civil, MD;  Location: [facility];  Service: Orthopedics;  Laterality: Left;  full relaxation  . FOOT SURGERY Left 2003   hammer toe- great toe  . KNEE ARTHROSCOPY W/ MENISCECTOMY     left knee  . SAVORY DILATION N/A 03/01/2017   Procedure: SAVORY DILATION;  Surgeon: Danie Binder, MD;  [facility];  Service: Endoscopy;  Laterality: N/A;    There were no vitals filed for this visit.      Subjective Assessment - 06/13/17 1523    Subjective  S: I'm having pain again today and I don't know what happened. It's like it came back.   Currently in Pain? Yes   Pain  Score 6    Pain Location Shoulder   Pain Orientation Left   Pain Descriptors / Indicators Sore;Constant   Pain Type Acute pain   Pain Radiating Towards N/A   Pain Onset More than a month ago   Pain Frequency Constant   Aggravating Factors  using it   Pain Relieving Factors ice   Effect of Pain on Daily Activities severe   Multiple Pain Sites No            [facility] OT Assessment - 06/13/17 1533      Assessment   Diagnosis Left shoulder adhesive capsulitis     Precautions   Precautions None                  OT Treatments/Exercises (OP) - 06/13/17 1534      Exercises   Exercises Shoulder     Shoulder Exercises: Supine   Protraction PROM;5 reps;Strengthening;10 reps   Protraction Weight (lbs) 1   Horizontal ABduction PROM;5 reps;Strengthening;10 reps   Horizontal ABduction Weight (lbs) 1   External Rotation PROM;5 reps;Strengthening;10 reps   External Rotation Weight (lbs) 1   Internal Rotation PROM;5 reps;Strengthening;10 reps   Internal Rotation Weight (lbs) 1   Flexion PROM;5 reps;Strengthening;10 reps   Shoulder Flexion Weight (lbs) 1   ABduction PROM;5 reps;Strengthening;10 reps   Shoulder ABduction Weight (lbs) 1     Shoulder Exercises: Standing  Flexion Strengthening;10 reps   Shoulder Flexion Weight (lbs) 1   Other Standing Exercises Overhead press with 1#. 10X   Other Standing Exercises PNF patterns up and down only, 10X each     Shoulder Exercises: ROM/Strengthening   Pendulum each way, 1'     Shoulder Exercises: Stretch   Cross Chest Stretch 60 seconds;2 reps   Internal Rotation Stretch 60 seconds  2X                OT Education - 06/13/17 1604    Education provided Yes   Education Details educated pt on phases 1-3 of MD protocol to be doing at home   Person(s) Educated Patient   Methods Explanation;Demonstration;Verbal cues   Comprehension Verbalized understanding;Returned demonstration          OT Short Term Goals -  06/10/17 1619      OT SHORT TERM GOAL #1   Title Patient will be educated and independent with HEP to increase functional use of LUE during daily tasks.    Time 3   Period Weeks     OT SHORT TERM GOAL #2   Title Patient will increase P/ROM to [facility] to increase ability to get shirts on and off with less difficulty.   Baseline 05/16/17: She can donn and doff her shirts but with the use of compensatory strategies.   Time 3   Period Weeks   Status Partially Met     OT SHORT TERM GOAL #3   Title Patient will increase LUE strength to 3/5 to increase functional reaching ability to increase ability to brush hair.   Time 3   Period Weeks   Status Achieved     OT SHORT TERM GOAL #4   Title Patient will decrease pain level in LUE to 5/10 during light household tasks.    Baseline 05/16/17: Her pain typically stays at a 6/10.   Time 3   Period Weeks   Status Achieved     OT SHORT TERM GOAL #5   Title patient will decrease fascial restrictions to mod amount to increase ability to complete tasks at shoulder with less difficulty.   Time 3   Period Weeks   Status Achieved           OT Long Term Goals - 06/10/17 1622      OT LONG TERM GOAL #1   Title Patient will return to highest level of independence when using LUE for all daily and work related tasks.    Time 6   Period Weeks   Status On-going     OT LONG TERM GOAL #2   Title Patient will decrease fascial restrictions to min amount or less in order to increase functional reaching ability.   Time 6   Period Weeks   Status Achieved     OT LONG TERM GOAL #3   Title Patient will decrease pain level in LUE during work activities to a 3/10 or less.   Time 6   Period Weeks   Status On-going     OT LONG TERM GOAL #4   Title Patient will increase A/ROM to [facility] to increase ability to hook and unhook bra.   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   Time 6   Period Weeks  Status Achieved               Plan - 06/13/17 1606    Clinical Impression Statement A: Session focused on following MD protocol and using as HEP. Pt reported increased pain over the last few days and during today's session. Completed phase 3 shoulder strengthening per protocol and supine shoulder strengthening with VC needed for form and technique.  Prone A/ROM and scapular theraband exercises not completed due to time constraint.   Plan P: Follow up on doctor's appointment on 06/14/17 and HEP. Continue with supine strengthening. Complete prone A/ROM and scapular theraband exercises.       Patient will benefit from skilled therapeutic intervention in order to improve the following deficits and impairments:  Pain, Decreased range of motion, Decreased strength, Increased fascial restricitons, Impaired UE functional use  Visit Diagnosis: Acute pain of left shoulder  Other symptoms and signs involving the musculoskeletal system  Stiffness of left shoulder, not elsewhere classified    Problem List Patient Active Problem List   Diagnosis Date Noted  . Adhesive capsulitis of left shoulder   . Dyspepsia and disorder of function of stomach   . Constipation 02/15/2017  . Dysphagia 02/15/2017  . Encounter for screening colonoscopy 02/15/2017  . Arthritis of knee, degenerative 03/09/2014  . Knee pain 08/21/2011  . Stiffness of joint, not elsewhere classified, lower leg 08/21/2011  . Medial meniscus, posterior horn derangement 08/02/2011  . Torn meniscus 07/19/2011  . HIP PAIN 02/16/2009    Luther Hearing, OT Student [phone] 06/13/2017, 4:12 PM  [place] [address] [place], [place], [address] Phone: [phone]   Fax:  [phone]  Name: Carla Wise MRN: [id number] Date of Birth: 1966/05/10    Note reviewed by qualified practitioner and  accurately reflects treatment session.  Guadelupe Sabin, OTR/L  [phone]

## 2017-06-14 ENCOUNTER — Ambulatory Visit (INDEPENDENT_AMBULATORY_CARE_PROVIDER_SITE_OTHER): Payer: BLUE CROSS/BLUE SHIELD | Admitting: Orthopedic Surgery

## 2017-06-14 ENCOUNTER — Encounter: Payer: Self-pay | Admitting: Orthopedic Surgery

## 2017-06-14 DIAGNOSIS — Z4889 Encounter for other specified surgical aftercare: Secondary | ICD-10-CM

## 2017-06-14 DIAGNOSIS — M7502 Adhesive capsulitis of left shoulder: Secondary | ICD-10-CM

## 2017-06-14 NOTE — Progress Notes (Signed)
Postop visit status post manipulation of left shoulder  Under anesthesia she had 180 of elevation 120 of abduction and 50 of external rotation  In the office today she had 120 of elevation 90 of abduction and 45 of external rotation  I have asked the therapist to aggressively increase the passive range of motion in all planes and I have sent a message to the therapist indicating such  Encounter Diagnoses  Name Primary?  . Adhesive capsulitis of left shoulder Yes  . Aftercare following surgery

## 2017-06-17 ENCOUNTER — Ambulatory Visit (HOSPITAL_COMMUNITY): Payer: BLUE CROSS/BLUE SHIELD | Admitting: Occupational Therapy

## 2017-06-17 ENCOUNTER — Encounter (HOSPITAL_COMMUNITY): Payer: Self-pay | Admitting: Occupational Therapy

## 2017-06-17 DIAGNOSIS — M25612 Stiffness of left shoulder, not elsewhere classified: Secondary | ICD-10-CM

## 2017-06-17 DIAGNOSIS — M25512 Pain in left shoulder: Secondary | ICD-10-CM

## 2017-06-17 DIAGNOSIS — R29898 Other symptoms and signs involving the musculoskeletal system: Secondary | ICD-10-CM

## 2017-06-17 NOTE — Therapy (Signed)
[place], [place], [address] Phone: [phone]   Fax:  [phone]  Occupational Therapy Treatment  Patient Details  Name: Carla Wise MRN: [id number] Date of Birth: 20-Jul-1966 Referring Provider: [name], MD  Encounter Date: 06/17/2017      OT End of Session - 06/17/17 1434    Visit Number 19   Number of Visits 27   Date for OT Re-Evaluation 07/10/17   Authorization Type BCBS    Authorization Time Period 30 visit limit with 0 used this year.   Authorization - Visit Number 19   Authorization - Number of Visits 30   OT Start Time 8756   OT Stop Time 1430   OT Time Calculation (min) 45 min   Activity Tolerance Patient tolerated treatment well   Behavior During Therapy WFL for tasks assessed/performed      Past Medical History:  Diagnosis Date  . Constipation   . Gastric adenoma 02/2017  . PONV (postoperative nausea and vomiting)     Past Surgical History:  Procedure Laterality Date  . ESOPHAGOGASTRODUODENOSCOPY N/A 03/01/2017   Procedure: ESOPHAGOGASTRODUODENOSCOPY (EGD);  Surgeon: Danie Binder, MD;  [facility];  Service: Endoscopy;  Laterality: N/A;  . EXAM UNDER ANESTHESIA WITH MANIPULATION OF SHOULDER Left 06/06/2017   Procedure: EXAM UNDER ANESTHESIA WITH MANIPULATION OF SHOULDER;  Surgeon: Carole Civil, MD;  Location: [facility];  Service: Orthopedics;  Laterality: Left;  full relaxation  . FOOT SURGERY Left 2003   hammer toe- great toe  . KNEE ARTHROSCOPY W/ MENISCECTOMY     left knee  . SAVORY DILATION N/A 03/01/2017   Procedure: SAVORY DILATION;  Surgeon: Danie Binder, MD;  [facility];  Service: Endoscopy;  Laterality: N/A;    There were no vitals filed for this visit.      Subjective Assessment - 06/17/17 1356    Subjective  S: I had a rough last two days for some reason.   Currently in Pain? Yes   Pain Score 5    Pain Location Shoulder    Pain Orientation Left   Pain Descriptors / Indicators Sore;Constant   Pain Type Acute pain   Pain Radiating Towards N/A   Pain Onset More than a month ago   Pain Frequency Constant   Aggravating Factors  using it   Pain Relieving Factors ice, heat   Effect of Pain on Daily Activities severe   Multiple Pain Sites No            [facility] OT Assessment - 06/17/17 1401      Assessment   Diagnosis Left shoulder adhesive capsulitis     Precautions   Precautions None           OT Treatments/Exercises (OP) - 06/17/17 1403      Exercises   Exercises Shoulder     Shoulder Exercises: Supine   Protraction PROM;5 reps;Strengthening;10 reps   Protraction Weight (lbs) 1   Horizontal ABduction PROM;5 reps;Strengthening;10 reps   Horizontal ABduction Weight (lbs) 1   External Rotation PROM;5 reps;Strengthening;10 reps   External Rotation Weight (lbs) 1   Internal Rotation PROM;5 reps;Strengthening;10 reps   Internal Rotation Weight (lbs) 1   Flexion PROM;5 reps;Strengthening;10 reps   Shoulder Flexion Weight (lbs) 1   ABduction PROM;5 reps;Strengthening;10 reps   Shoulder ABduction Weight (lbs) 1     Shoulder Exercises: Prone   Retraction AROM;10 reps   Flexion AROM;10 reps  Extension AROM;10 reps   External Rotation AROM;10 reps   Internal Rotation AROM;10 reps   Horizontal ABduction 1 AROM;10 reps   Horizontal ABduction 2 AROM;10 reps     Shoulder Exercises: Standing   Extension Theraband;10 reps   Theraband Level (Shoulder Extension) Level 2 (Red)   Row Theraband;12 reps   Theraband Level (Shoulder Row) Level 2 (Red)   Retraction Theraband;10 reps   Theraband Level (Shoulder Retraction) Level 2 (Red)            OT Education - 06/17/17 1434    Education provided Yes   Education Details educated pt to only continue doing HEP provided last week that therapist went over   [employer]) Educated Patient   Methods Explanation   Comprehension Verbalized understanding           OT Short Term Goals - 06/10/17 1619      OT SHORT TERM GOAL #1   Title Patient will be educated and independent with HEP to increase functional use of LUE during daily tasks.    Time 3   Period Weeks     OT SHORT TERM GOAL #2   Title Patient will increase P/ROM to [facility] to increase ability to get shirts on and off with less difficulty.   Baseline 05/16/17: She can donn and doff her shirts but with the use of compensatory strategies.   Time 3   Period Weeks   Status Partially Met     OT SHORT TERM GOAL #3   Title Patient will increase LUE strength to 3/5 to increase functional reaching ability to increase ability to brush hair.   Time 3   Period Weeks   Status Achieved     OT SHORT TERM GOAL #4   Title Patient will decrease pain level in LUE to 5/10 during light household tasks.    Baseline 05/16/17: Her pain typically stays at a 6/10.   Time 3   Period Weeks   Status Achieved     OT SHORT TERM GOAL #5   Title patient will decrease fascial restrictions to mod amount to increase ability to complete tasks at shoulder with less difficulty.   Time 3   Period Weeks   Status Achieved           OT Long Term Goals - 06/10/17 1622      OT LONG TERM GOAL #1   Title Patient will return to highest level of independence when using LUE for all daily and work related tasks.    Time 6   Period Weeks   Status On-going     OT LONG TERM GOAL #2   Title Patient will decrease fascial restrictions to min amount or less in order to increase functional reaching ability.   Time 6   Period Weeks   Status Achieved     OT LONG TERM GOAL #3   Title Patient will decrease pain level in LUE during work activities to a 3/10 or less.   Time 6   Period Weeks   Status On-going     OT LONG TERM GOAL #4   Title Patient will increase A/ROM to [facility] to increase ability to hook and unhook bra.   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   Time 6   Period  Weeks   Status Achieved               Plan - 06/17/17 1652    Clinical Impression Statement A: Pt completed prone A/ROM and scapular theraband exercises with VC needed for form and technique. Shoulder strengthening continued with increased repetitions. Pt reports she has been doing all exercises given by doctor and therapist since the beginning of therapy and was educated to only do the newest exercises being given. Pt reports increased soreness over the weekend and before therapy session today.   Plan P: Increase prone A/ROM repetitions as well as supine strengthening repetitions. Complete all shoulder theraband movements within patient's pain tolerance.       Patient will benefit from skilled therapeutic intervention in order to improve the following deficits and impairments:  Pain, Decreased range of motion, Decreased strength, Increased fascial restricitons, Impaired UE functional use  Visit Diagnosis: Acute pain of left shoulder  Other symptoms and signs involving the musculoskeletal system  Stiffness of left shoulder, not elsewhere classified    Problem List Patient Active Problem List   Diagnosis Date Noted  . Adhesive capsulitis of left shoulder   . Dyspepsia and disorder of function of stomach   . Constipation 02/15/2017  . Dysphagia 02/15/2017  . Encounter for screening colonoscopy 02/15/2017  . Arthritis of knee, degenerative 03/09/2014  . Knee pain 08/21/2011  . Stiffness of joint, not elsewhere classified, lower leg 08/21/2011  . Medial meniscus, posterior horn derangement 08/02/2011  . Torn meniscus 07/19/2011  . HIP PAIN 02/16/2009    Luther Hearing, OT Student [phone] 06/17/2017, 5:01 PM  [place] [address] [place], [place], [address] Phone: [phone]   Fax:  [phone]  Name:  DOMINGUE COLTRAIN MRN: [id number] Date of Birth: 06-Mar-1966   Note reviewed by qualified practitioner and accurately reflects treatment session.  Guadelupe Sabin, OTR/L  [phone] 06/17/2017

## 2017-06-18 ENCOUNTER — Ambulatory Visit (HOSPITAL_COMMUNITY): Payer: BLUE CROSS/BLUE SHIELD | Admitting: Occupational Therapy

## 2017-06-19 ENCOUNTER — Encounter (HOSPITAL_COMMUNITY): Payer: Self-pay

## 2017-06-19 ENCOUNTER — Ambulatory Visit (HOSPITAL_COMMUNITY): Payer: BLUE CROSS/BLUE SHIELD

## 2017-06-19 DIAGNOSIS — M25512 Pain in left shoulder: Secondary | ICD-10-CM

## 2017-06-19 DIAGNOSIS — R29898 Other symptoms and signs involving the musculoskeletal system: Secondary | ICD-10-CM | POA: Diagnosis not present

## 2017-06-19 DIAGNOSIS — M25612 Stiffness of left shoulder, not elsewhere classified: Secondary | ICD-10-CM

## 2017-06-19 NOTE — Therapy (Signed)
[place], [place], [address] Phone: [phone]   Fax:  [phone]  Occupational Therapy Treatment  Patient Details  Name: Carla Wise MRN: [id number] Date of Birth: 1966/08/07 Referring Provider: [name], MD  Encounter Date: 06/19/2017      OT End of Session - 06/19/17 1115    Visit Number 20   Number of Visits 27   Date for OT Re-Evaluation 07/10/17   Authorization Type BCBS    Authorization Time Period 30 visit limit with 0 used this year.   Authorization - Visit Number 20   Authorization - Number of Visits 30   OT Start Time 2671   OT Stop Time 1116   OT Time Calculation (min) 44 min   Activity Tolerance Patient tolerated treatment well   Behavior During Therapy WFL for tasks assessed/performed      Past Medical History:  Diagnosis Date  . Constipation   . Gastric adenoma 02/2017  . PONV (postoperative nausea and vomiting)     Past Surgical History:  Procedure Laterality Date  . ESOPHAGOGASTRODUODENOSCOPY N/A 03/01/2017   Procedure: ESOPHAGOGASTRODUODENOSCOPY (EGD);  Surgeon: Danie Binder, MD;  [facility];  Service: Endoscopy;  Laterality: N/A;  . EXAM UNDER ANESTHESIA WITH MANIPULATION OF SHOULDER Left 06/06/2017   Procedure: EXAM UNDER ANESTHESIA WITH MANIPULATION OF SHOULDER;  Surgeon: Carole Civil, MD;  Location: [facility];  Service: Orthopedics;  Laterality: Left;  full relaxation  . FOOT SURGERY Left 2003   hammer toe- great toe  . KNEE ARTHROSCOPY W/ MENISCECTOMY     left knee  . SAVORY DILATION N/A 03/01/2017   Procedure: SAVORY DILATION;  Surgeon: Danie Binder, MD;  [facility]: [facility];  Service: Endoscopy;  Laterality: N/A;    There were no vitals filed for this visit.      Subjective Assessment - 06/19/17 1055    Subjective  S: That knot has come back in my arm and I'm still having trouble with this one motion (external rotation).    Currently  in Pain? Yes   Pain Score 5    Pain Location Shoulder   Pain Orientation Left   Pain Descriptors / Indicators Sore;Constant   Pain Type Acute pain   Pain Radiating Towards N/A   Pain Onset More than a month ago   Pain Frequency Constant   Aggravating Factors  using it   Pain Relieving Factors ice, heat   Effect of Pain on Daily Activities severe   Multiple Pain Sites No            [facility] OT Assessment - 06/19/17 1037      Assessment   Diagnosis Left shoulder adhesive capsulitis     Precautions   Precautions None                  OT Treatments/Exercises (OP) - 06/19/17 1054      Exercises   Exercises Shoulder     Shoulder Exercises: Supine   Protraction PROM;15 reps   Horizontal ABduction PROM;15 reps   External Rotation PROM;15 reps   Internal Rotation PROM;15 reps   Flexion PROM;15 reps   ABduction PROM;15 reps     Shoulder Exercises: ROM/Strengthening   UBE (Upper Arm Bike) 3' backwards, level 3     Shoulder Exercises: Stretch   External Rotation Stretch 60 seconds;1 rep   Wall Stretch - Flexion 60 seconds;1 rep   Wall Stretch - ABduction 60 seconds;1  rep  horizontal abduction completed     Manual Therapy   Manual Therapy Myofascial release;Muscle Energy Technique   Manual therapy comments manual therapy completed prior to exercises.    Myofascial Release Myofascial release and manual stretching completed to left upper arm, trapezius, and scapular region to decrease fascial restrictions and increase joint mobility in a pain free zone.    Muscle Energy Technique Muscle energy technique completed to left anterior and middle deltoid to relax tone and improve range of motion.                 OT Education - 06/19/17 1100    Education provided Yes   Education Details Educated pt on how to complete self myofascial release using ball, edcuated pt to only complete newest exercises given   Person(s) Educated Patient   Methods  Explanation;Demonstration;Verbal cues   Comprehension Verbalized understanding          OT Short Term Goals - 06/19/17 1312      OT SHORT TERM GOAL #1   Title Patient will be educated and independent with HEP to increase functional use of LUE during daily tasks.    Time 3   Period Weeks     OT SHORT TERM GOAL #2   Title Patient will increase P/ROM to [facility] to increase ability to get shirts on and off with less difficulty.   Baseline 05/16/17: She can donn and doff her shirts but with the use of compensatory strategies.   Time 3   Period Weeks   Status Partially Met     OT SHORT TERM GOAL #3   Title Patient will increase LUE strength to 3/5 to increase functional reaching ability to increase ability to brush hair.   Time 3   Period Weeks     OT SHORT TERM GOAL #4   Title Patient will decrease pain level in LUE to 5/10 during light household tasks.    Baseline 05/16/17: Her pain typically stays at a 6/10.   Time 3   Period Weeks     OT SHORT TERM GOAL #5   Title patient will decrease fascial restrictions to mod amount to increase ability to complete tasks at shoulder with less difficulty.   Time 3   Period Weeks           OT Long Term Goals - 06/19/17 1312      OT LONG TERM GOAL #1   Title Patient will return to highest level of independence when using LUE for all daily and work related tasks.    Time 6   Period Weeks   Status On-going     OT LONG TERM GOAL #2   Title Patient will decrease fascial restrictions to min amount or less in order to increase functional reaching ability.   Time 6   Period Weeks     OT LONG TERM GOAL #3   Title Patient will decrease pain level in LUE during work activities to a 3/10 or less.   Time 6   Period Weeks   Status On-going     OT LONG TERM GOAL #4   Title Patient will increase A/ROM to [facility] to increase ability to hook and unhook bra.   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   Time 6  Period Weeks               Plan - 06/19/17 1254    Clinical Impression Statement A: Per MD orders, pt needed increased P/ROM and manual therapy. Completed P/ROM with increased ROM in flexion and abduction noted. Muscle energy techniques used in conjunction with P/ROM to increase ROM. Completed shoulder stretches with VC needed for form and technique. Pt had great response to manual technique, showing increased ROM although increased pain is still present.   Plan P: Continue with manual techniques to increase ROM. Review HEP to get rid of unneeded exercises. Follow up on self myofascial release at home.      Patient will benefit from skilled therapeutic intervention in order to improve the following deficits and impairments:  Pain, Decreased range of motion, Decreased strength, Increased fascial restricitons, Impaired UE functional use  Visit Diagnosis: Acute pain of left shoulder  Other symptoms and signs involving the musculoskeletal system  Stiffness of left shoulder, not elsewhere classified    Problem List Patient Active Problem List   Diagnosis Date Noted  . Adhesive capsulitis of left shoulder   . Dyspepsia and disorder of function of stomach   . Constipation 02/15/2017  . Dysphagia 02/15/2017  . Encounter for screening colonoscopy 02/15/2017  . Arthritis of knee, degenerative 03/09/2014  . Knee pain 08/21/2011  . Stiffness of joint, not elsewhere classified, lower leg 08/21/2011  . Medial meniscus, posterior horn derangement 08/02/2011  . Torn meniscus 07/19/2011  . HIP PAIN 02/16/2009    Luther Hearing, OT Student [phone] 06/19/2017, 1:13 PM  [facility] [address] [place], [place], [address] Phone: [phone]   Fax:  [phone]  Name: LORAINA STAUFFER MRN: [id number] Date of Birth: 1966-04-18    Session completed by Ailene Ravel, OTR/L,CBIS and noted written by Luther Hearing, OT student  Ailene Ravel, OTR/L,CBIS  [phone]

## 2017-06-20 ENCOUNTER — Encounter: Payer: Self-pay | Admitting: Gastroenterology

## 2017-06-20 ENCOUNTER — Ambulatory Visit (INDEPENDENT_AMBULATORY_CARE_PROVIDER_SITE_OTHER): Payer: BLUE CROSS/BLUE SHIELD | Admitting: Gastroenterology

## 2017-06-20 VITALS — BP 120/83 | HR 87 | Temp 97.8°F | Ht 62.0 in | Wt 106.2 lb

## 2017-06-20 DIAGNOSIS — R1310 Dysphagia, unspecified: Secondary | ICD-10-CM | POA: Diagnosis not present

## 2017-06-20 DIAGNOSIS — K5900 Constipation, unspecified: Secondary | ICD-10-CM

## 2017-06-20 NOTE — Assessment & Plan Note (Signed)
Secondary to benign-appearing peptic stricture s/p dilation. Dysphagia resolved. Incidentally noted gastric polyp (tubular adenoma) on EGD. Will need surveillance in 1 year. No further dyspepsia. Advised to take Prilosec daily while on Ibuprofen. She does not want to continue this medication daily indefinitely but is agreeable to taking every other day. Return in 1 year.

## 2017-06-20 NOTE — Progress Notes (Addendum)
REVIEWED. NEEDS TCS IN 2018. REPEAT EGD IN [facility] 2019.  Referring Provider: Kathyrn Drown, MD Primary Care Physician:  Kathyrn Drown, MD Primary GI: Dr. Oneida Alar    Chief Complaint  Patient presents with  . Dysphagia    HPI:   Carla Wise is a 51 y.o. female presenting today with a history of dysphagia, with recent EGD noting benign-appearing peptic stricture s/p dilation. Gastric polyp (tubular adenoma), gastritis, duodenal diverticulum. She has a history of constipation as well with need for initial screening colonoscopy at some point in the future when she is able to pursue. She has recently had issues with a frozen shoulder and underwent an exam under anesthesia. She is still recovering from this and with pain/discomfort.   No dysphagia. No issues with GERD. Insurance won't pay for colonoscopy until August. She is wanting to hold off on colonoscopy right now until things settle. Not taking Prilosec. Taking Linzess 290 mcg prn, which seems to work for her. BM every other day. On NSAIDs due to shoulder pain.   Past Medical History:  Diagnosis Date  . Constipation   . Gastric adenoma 02/2017  . PONV (postoperative nausea and vomiting)     Past Surgical History:  Procedure Laterality Date  . ESOPHAGOGASTRODUODENOSCOPY N/A 03/01/2017   Dr. Oneida Alar: benign-appearing peptic stricture s/p dilation, gastric polyp (tubular adenoma), gastritis, duodenal diverticulm  . EXAM UNDER ANESTHESIA WITH MANIPULATION OF SHOULDER Left 06/06/2017   Procedure: EXAM UNDER ANESTHESIA WITH MANIPULATION OF SHOULDER;  Surgeon: Carole Civil, MD;  Location: [facility];  Service: Orthopedics;  Laterality: Left;  full relaxation  . FOOT SURGERY Left 2003   hammer toe- great toe  . KNEE ARTHROSCOPY W/ MENISCECTOMY     left knee  . SAVORY DILATION N/A 03/01/2017   Procedure: SAVORY DILATION;  Surgeon: Danie Binder, MD;  Location: [facility];  Service: Endoscopy;  Laterality: N/A;    Current  Outpatient Prescriptions  Medication Sig Dispense Refill  . ibuprofen (ADVIL,MOTRIN) 800 MG tablet Take 1 tablet (800 mg total) by mouth every 8 (eight) hours as needed. 90 tablet 1  . tiZANidine (ZANAFLEX) 4 MG tablet Take 1 tablet (4 mg total) by mouth 3 (three) times daily. 90 tablet 1   No current facility-administered medications for this visit.     Allergies as of 06/20/2017 - Review Complete 06/20/2017  Allergen Reaction Noted  . Azithromycin  11/23/2013  . Levaquin [levofloxacin in d5w]  02/04/2015    Family History  Problem Relation Age of Onset  . Heart disease Unknown   . Cancer Unknown   . Colon cancer Neg Hx     Social History   Social History  . Marital status: Married    Spouse name: N/A  . Number of children: N/A  . Years of education: 12th grade   Occupational History  . [employer]     cleaning service    Social History Main Topics  . Smoking status: Current Every Day Smoker    Packs/day: 0.50    Years: 25.00    Types: Cigarettes  . Smokeless tobacco: Never Used  . Alcohol use No  . Drug use: No  . Sexual activity: Yes    Birth control/ protection: Surgical   Other Topics Concern  . None   Social History Narrative  . None    Review of Systems: As mentioned in HPI   Physical Exam: BP 120/83   Pulse 87   Temp 97.8 F (  36.6 C) (Oral)   Ht 5\' 2"  (1.575 m)   Wt 106 lb 3.2 oz (48.2 kg)   BMI 19.42 kg/m  General:   Alert and oriented. No distress noted. Pleasant and cooperative.  Head:  Normocephalic and atraumatic. Eyes:  Conjuctiva clear without scleral icterus. Mouth:  Oral mucosa pink and moist. Good dentition. No lesions. Abdomen:  +BS, soft, non-tender and non-distended. No rebound or guarding. No HSM or masses noted. Msk:  Symmetrical without gross deformities. Normal posture. Extremities:  Without edema. Neurologic:  Alert and  oriented x4;  grossly normal neurologically. Psych:  Alert and cooperative. Normal mood and affect.

## 2017-06-20 NOTE — Progress Notes (Signed)
cc'ed to pcp °

## 2017-06-20 NOTE — Assessment & Plan Note (Signed)
Linzess 290 mcg several times a week, doing well with this novel approach. Still needs initial screening colonoscopy, and she will call to be triaged when she is able to pursue.

## 2017-06-20 NOTE — Patient Instructions (Signed)
Take the Prilosec while you are on the Ibuprofen. After that, you can take it every other day.  Continue the Linzess as you are doing.  We will see you back in 1 year!  Call when you are ready to move forward with the colonoscopy and things have settled down!

## 2017-06-25 ENCOUNTER — Encounter (HOSPITAL_COMMUNITY): Payer: Self-pay

## 2017-06-25 ENCOUNTER — Ambulatory Visit (HOSPITAL_COMMUNITY): Payer: BLUE CROSS/BLUE SHIELD

## 2017-06-25 DIAGNOSIS — R29898 Other symptoms and signs involving the musculoskeletal system: Secondary | ICD-10-CM | POA: Diagnosis not present

## 2017-06-25 DIAGNOSIS — M25612 Stiffness of left shoulder, not elsewhere classified: Secondary | ICD-10-CM

## 2017-06-25 DIAGNOSIS — M25512 Pain in left shoulder: Secondary | ICD-10-CM | POA: Diagnosis not present

## 2017-06-25 NOTE — Therapy (Signed)
[name] [address] [place], [place], [address] Phone: [phone]   Fax:  [phone]  Occupational Therapy Treatment  Patient Details  Name: Carla Wise MRN: [id number] Date of Birth: 1966-05-19 Referring Provider: Fuller Canada, MD  Encounter Date: 06/25/2017      OT End of Session - 06/25/17 1223    Visit Number 21   Number of Visits 27   Date for OT Re-Evaluation 07/10/17   Authorization Type BCBS    Authorization Time Period 30 visit limit with 0 used this year.   Authorization - Visit Number 21   Authorization - Number of Visits 30   OT Start Time 1120   OT Stop Time 1200   OT Time Calculation (min) 40 min   Activity Tolerance Patient tolerated treatment well   Behavior During Therapy WFL for tasks assessed/performed      Past Medical History:  Diagnosis Date  . Constipation   . Gastric adenoma 02/2017  . PONV (postoperative nausea and vomiting)     Past Surgical History:  Procedure Laterality Date  . ESOPHAGOGASTRODUODENOSCOPY N/A 03/01/2017   Dr. Darrick Penna: benign-appearing peptic stricture s/p dilation, gastric polyp (tubular adenoma), gastritis, duodenal diverticulm  . EXAM UNDER ANESTHESIA WITH MANIPULATION OF SHOULDER Left 06/06/2017   Procedure: EXAM UNDER ANESTHESIA WITH MANIPULATION OF SHOULDER;  Surgeon: Vickki Hearing, MD;  Location: [facility];  Service: Orthopedics;  Laterality: Left;  full relaxation  . FOOT SURGERY Left 2003   hammer toe- great toe  . KNEE ARTHROSCOPY W/ MENISCECTOMY     left knee  . SAVORY DILATION N/A 03/01/2017   Procedure: SAVORY DILATION;  Surgeon: West Bali, MD;  Location: [facility];  Service: Endoscopy;  Laterality: N/A;    There were no vitals filed for this visit.      Subjective Assessment - 06/25/17 1222    Subjective  S: i think you'll be really pleased how far I'm able to move it today.   Currently in Pain? Yes   Pain Score 4    Pain Location  Shoulder   Pain Orientation Left   Pain Descriptors / Indicators Constant;Sore   Pain Type Acute pain                      OT Treatments/Exercises (OP) - 06/25/17 1148      Exercises   Exercises Shoulder     Shoulder Exercises: Supine   Protraction PROM;15 reps   Horizontal ABduction PROM;15 reps   External Rotation PROM;15 reps   Internal Rotation PROM;15 reps   Flexion PROM;15 reps   ABduction PROM;15 reps     Shoulder Exercises: ROM/Strengthening   UBE (Upper Arm Bike) 4' backwards, level 3     Shoulder Exercises: Stretch   Other Shoulder Stretches Sleeper stretch; internal and external rotation; 2 set; 1 minute each   Other Shoulder Stretches Standing child's pose using wall; holding for 30 seconds and completing 3 sets with each position held further down the wall     Manual Therapy   Manual Therapy Myofascial release;Muscle Energy Technique   Manual therapy comments manual therapy completed prior to exercises.    Myofascial Release Myofascial release and manual stretching completed to left upper arm, trapezius, and scapular region to decrease fascial restrictions and increase joint mobility in a pain free zone.    Muscle Energy Technique Muscle energy technique completed to left anterior and middle deltoid to relax tone and improve range  of motion.                   OT Short Term Goals - 06/19/17 1312      OT SHORT TERM GOAL #1   Title Patient will be educated and independent with HEP to increase functional use of LUE during daily tasks.    Time 3   Period Weeks     OT SHORT TERM GOAL #2   Title Patient will increase P/ROM to [facility] to increase ability to get shirts on and off with less difficulty.   Baseline 05/16/17: She can donn and doff her shirts but with the use of compensatory strategies.   Time 3   Period Weeks   Status Partially Met     OT SHORT TERM GOAL #3   Title Patient will increase LUE strength to 3/5 to increase functional  reaching ability to increase ability to brush hair.   Time 3   Period Weeks     OT SHORT TERM GOAL #4   Title Patient will decrease pain level in LUE to 5/10 during light household tasks.    Baseline 05/16/17: Her pain typically stays at a 6/10.   Time 3   Period Weeks     OT SHORT TERM GOAL #5   Title patient will decrease fascial restrictions to mod amount to increase ability to complete tasks at shoulder with less difficulty.   Time 3   Period Weeks           OT Long Term Goals - 06/19/17 1312      OT LONG TERM GOAL #1   Title Patient will return to highest level of independence when using LUE for all daily and work related tasks.    Time 6   Period Weeks   Status On-going     OT LONG TERM GOAL #2   Title Patient will decrease fascial restrictions to min amount or less in order to increase functional reaching ability.   Time 6   Period Weeks     OT LONG TERM GOAL #3   Title Patient will decrease pain level in LUE during work activities to a 3/10 or less.   Time 6   Period Weeks   Status On-going     OT LONG TERM GOAL #4   Title Patient will increase A/ROM to [facility] to increase ability to hook and unhook bra.   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   Time 6   Period Weeks               Plan - 06/25/17 1223    Clinical Impression Statement A: Patient continues to make slow and steady progress with P/ROM. Session continued with manual techniques and stretches to increase ROM. Discussed which exercises to continue doing as patient did not bring all exercise handouts this session.   Plan P: Continue with manual techniques to increase ROM. Add 1-2# weight and attempt supine exercises.       Patient will benefit from skilled therapeutic intervention in order to improve the following deficits and impairments:  Pain, Decreased  range of motion, Decreased strength, Increased fascial restricitons, Impaired UE functional use  Visit Diagnosis: Acute pain of left shoulder  Other symptoms and signs involving the musculoskeletal system  Stiffness of  left shoulder, not elsewhere classified    Problem List Patient Active Problem List   Diagnosis Date Noted  . Adhesive capsulitis of left shoulder   . Dyspepsia and disorder of function of stomach   . Constipation 02/15/2017  . Dysphagia 02/15/2017  . Encounter for screening colonoscopy 02/15/2017  . Arthritis of knee, degenerative 03/09/2014  . Knee pain 08/21/2011  . Stiffness of joint, not elsewhere classified, lower leg 08/21/2011  . Medial meniscus, posterior horn derangement 08/02/2011  . Torn meniscus 07/19/2011  . HIP PAIN 02/16/2009   Ailene Ravel, OTR/L,CBIS  [phone]  06/25/2017, 12:33 PM  [place] [address] [place], [place], [address] Phone: [phone]   Fax:  [phone]  Name: Carla Wise MRN: [id number] Date of Birth: 02-28-1966

## 2017-06-26 ENCOUNTER — Encounter (HOSPITAL_COMMUNITY): Payer: BLUE CROSS/BLUE SHIELD

## 2017-06-27 ENCOUNTER — Encounter (HOSPITAL_COMMUNITY): Payer: Self-pay

## 2017-06-27 ENCOUNTER — Ambulatory Visit (HOSPITAL_COMMUNITY): Payer: BLUE CROSS/BLUE SHIELD

## 2017-06-27 DIAGNOSIS — R29898 Other symptoms and signs involving the musculoskeletal system: Secondary | ICD-10-CM | POA: Diagnosis not present

## 2017-06-27 DIAGNOSIS — M25512 Pain in left shoulder: Secondary | ICD-10-CM | POA: Diagnosis not present

## 2017-06-27 DIAGNOSIS — M25612 Stiffness of left shoulder, not elsewhere classified: Secondary | ICD-10-CM

## 2017-06-27 NOTE — Therapy (Signed)
[facility], [place], [address] Phone: [phone]   Fax:  [phone]  Occupational Therapy Treatment  Patient Details  Name: Carla Wise MRN: [id number] Date of Birth: 1966-03-03 Referring Provider: [name], MD  Encounter Date: 06/27/2017      OT End of Session - 06/27/17 1650    Visit Number 22   Number of Visits 27   Date for OT Re-Evaluation 07/10/17   Authorization Type BCBS    Authorization Time Period 30 visit limit with 0 used this year.   Authorization - Visit Number 22   Authorization - Number of Visits 30   OT Start Time 8270   OT Stop Time 1600   OT Time Calculation (min) 42 min   Activity Tolerance Patient tolerated treatment well   Behavior During Therapy WFL for tasks assessed/performed      Past Medical History:  Diagnosis Date  . Constipation   . Gastric adenoma 02/2017  . PONV (postoperative nausea and vomiting)     Past Surgical History:  Procedure Laterality Date  . ESOPHAGOGASTRODUODENOSCOPY N/A 03/01/2017   Dr. Oneida Alar: benign-appearing peptic stricture s/p dilation, gastric polyp (tubular adenoma), gastritis, duodenal diverticulm  . EXAM UNDER ANESTHESIA WITH MANIPULATION OF SHOULDER Left 06/06/2017   Procedure: EXAM UNDER ANESTHESIA WITH MANIPULATION OF SHOULDER;  Surgeon: Carole Civil, MD;  Location: [facility];  Service: Orthopedics;  Laterality: Left;  full relaxation  . FOOT SURGERY Left 2003   hammer toe- great toe  . KNEE ARTHROSCOPY W/ MENISCECTOMY     left knee  . SAVORY DILATION N/A 03/01/2017   Procedure: SAVORY DILATION;  Surgeon: Danie Binder, MD;  Location: [facility];  Service: Endoscopy;  Laterality: N/A;    There were no vitals filed for this visit.      Subjective Assessment - 06/27/17 1531    Subjective  S: That one exercise was ahrd (standing child's pose using wall).   Currently in Pain? Yes   Pain Score 4    Pain Location Shoulder    Pain Orientation Left   Pain Descriptors / Indicators Constant;Sore   Pain Type Acute pain   Pain Radiating Towards N/A   Pain Onset More than a month ago   Pain Frequency Constant   Aggravating Factors  using it   Pain Relieving Factors ice, heat   Effect of Pain on Daily Activities severe   Multiple Pain Sites No            [facility] OT Assessment - 06/27/17 1536      Assessment   Diagnosis Left shoulder adhesive capsulitis     Precautions   Precautions None                  OT Treatments/Exercises (OP) - 06/27/17 1537      Exercises   Exercises Shoulder     Shoulder Exercises: Supine   Protraction PROM;Strengthening;10 reps   Protraction Weight (lbs) 2   Horizontal ABduction PROM;Strengthening;10 reps   Horizontal ABduction Weight (lbs) 2   External Rotation PROM;Strengthening;10 reps   External Rotation Weight (lbs) 2   Internal Rotation PROM;Strengthening;10 reps   Internal Rotation Weight (lbs) 2   Flexion PROM;Strengthening;10 reps   Shoulder Flexion Weight (lbs) 2   ABduction PROM;Strengthening;10 reps   Shoulder ABduction Weight (lbs) 2     Shoulder Exercises: Stretch   Other Shoulder Stretches Sleeper stretch; internal and external rotation; 2 set; 1 minute each  Other Shoulder Stretches Standing child's pose using wall; holding for 30 seconds and completing 3 sets with each position held further down the wall     Manual Therapy   Manual Therapy Myofascial release;Muscle Energy Technique   Manual therapy comments manual therapy completed prior to exercises.    Myofascial Release Myofascial release and manual stretching completed to left upper arm, trapezius, and scapular region to decrease fascial restrictions and increase joint mobility in a pain free zone.    Muscle Energy Technique Muscle energy technique completed to left anterior and middle deltoid to relax tone and improve range of motion.                 OT Education -  06/27/17 1649    Education provided Yes   Education Details Provided pt with updated HEP's to continue working on   [employer]) Educated Patient   Methods Explanation;Demonstration;Verbal cues;Handout   Comprehension Verbalized understanding;Returned demonstration          OT Short Term Goals - 06/19/17 1312      OT SHORT TERM GOAL #1   Title Patient will be educated and independent with HEP to increase functional use of LUE during daily tasks.    Time 3   Period Weeks     OT SHORT TERM GOAL #2   Title Patient will increase P/ROM to [facility] to increase ability to get shirts on and off with less difficulty.   Baseline 05/16/17: She can donn and doff her shirts but with the use of compensatory strategies.   Time 3   Period Weeks   Status Partially Met     OT SHORT TERM GOAL #3   Title Patient will increase LUE strength to 3/5 to increase functional reaching ability to increase ability to brush hair.   Time 3   Period Weeks     OT SHORT TERM GOAL #4   Title Patient will decrease pain level in LUE to 5/10 during light household tasks.    Baseline 05/16/17: Her pain typically stays at a 6/10.   Time 3   Period Weeks     OT SHORT TERM GOAL #5   Title patient will decrease fascial restrictions to mod amount to increase ability to complete tasks at shoulder with less difficulty.   Time 3   Period Weeks           OT Long Term Goals - 06/19/17 1312      OT LONG TERM GOAL #1   Title Patient will return to highest level of independence when using LUE for all daily and work related tasks.    Time 6   Period Weeks   Status On-going     OT LONG TERM GOAL #2   Title Patient will decrease fascial restrictions to min amount or less in order to increase functional reaching ability.   Time 6   Period Weeks     OT LONG TERM GOAL #3   Title Patient will decrease pain level in LUE during work activities to a 3/10 or less.   Time 6   Period Weeks   Status On-going     OT LONG TERM GOAL  #4   Title Patient will increase A/ROM to [facility] to increase ability to hook and unhook bra.   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   Time 6   Period Weeks               Plan - 06/27/17 1651    Clinical Impression Statement A: Pt continues to make progress with P/ROM. Session continued with manual techniques and stretches to increase ROM. Completed supine shoulder strengthening with VC needed for form and technique. Continuing to educate pt on only doing most updated HEP provided.    Plan P: Continue with manual techniques to increase ROM and shoulder strengthening. Attempt shoulder strengthening standing.      Patient will benefit from skilled therapeutic intervention in order to improve the following deficits and impairments:  Pain, Decreased range of motion, Decreased strength, Increased fascial restricitons, Impaired UE functional use  Visit Diagnosis: Acute pain of left shoulder  Other symptoms and signs involving the musculoskeletal system  Stiffness of left shoulder, not elsewhere classified    Problem List Patient Active Problem List   Diagnosis Date Noted  . Adhesive capsulitis of left shoulder   . Dyspepsia and disorder of function of stomach   . Constipation 02/15/2017  . Dysphagia 02/15/2017  . Encounter for screening colonoscopy 02/15/2017  . Arthritis of knee, degenerative 03/09/2014  . Knee pain 08/21/2011  . Stiffness of joint, not elsewhere classified, lower leg 08/21/2011  . Medial meniscus, posterior horn derangement 08/02/2011  . Torn meniscus 07/19/2011  . HIP PAIN 02/16/2009    Luther Hearing, OT Student [phone] 06/27/2017, 4:53 PM  [facility] [address] [place], [place], [address] Phone: [phone]   Fax:  [phone]  Name: Carla Wise MRN: [id number] Date of Birth: September 19, 1966    Note reviewed by clinical instructor and accurately reflects treatment session.   Ailene Ravel, OTR/L,CBIS  [phone]

## 2017-06-27 NOTE — Patient Instructions (Signed)
Anterior Capsule Sleeper Stretch, Side-Lying    Lie on side, pillow under head, neck in neutral, underside arm in 90-90 of shoulder and elbow flexion with scapula fixed to table. Use other hand to press palm of underside arm forward and downward. Keep elbow angle. Hold _60__ seconds.  Repeat __2_ times per session. Do _1-2__ sessions per day. Advanced: Use PNF contract-relax method.  Copyright  VHI. All rights reserved.  Posterior Capsule Sleeper Stretch, Side-Lying    Lie on side, pillow under head, neck in neutral, underside arm in 90-90 of shoulder and elbow flexion with scapula fixed to table. Use other hand to press back of underside arm forward and downward. Keep elbow angle. Hold _60__ seconds.  Repeat _2__ times per session. Do _2-3__ sessions per day. Advanced: Use PNF contract-relax method.  Copyright  VHI. All rights reserved.   The following stretches hold for 15 seconds and gradually increase to 45 seconds to a minute. Complete 3 times.  Towel Internal Rotation Stretch  Hold a towel in both hands. Bring one hand behind your body and with the other, reach behind your neck and use the towel to gently pull the other hand behind the back until you feel a stretch in the shoulder.     Posterior Capsule Stretch   Stand or sit, one arm across body so hand rests over opposite shoulder. Gently push on crossed elbow with other hand until stretch is felt in shoulder of crossed arm.   Wall Flexion  slide your arm up the wall until a stretch is felt in your shoulder .    -Use stick to push your arm up towards the ceiling and also out to the side; 12 times.

## 2017-06-28 ENCOUNTER — Encounter (HOSPITAL_COMMUNITY): Payer: BLUE CROSS/BLUE SHIELD

## 2017-07-02 ENCOUNTER — Ambulatory Visit (HOSPITAL_COMMUNITY): Payer: BLUE CROSS/BLUE SHIELD | Admitting: Occupational Therapy

## 2017-07-04 ENCOUNTER — Ambulatory Visit (HOSPITAL_COMMUNITY): Payer: BLUE CROSS/BLUE SHIELD

## 2017-07-04 ENCOUNTER — Encounter (HOSPITAL_COMMUNITY): Payer: Self-pay

## 2017-07-04 DIAGNOSIS — R29898 Other symptoms and signs involving the musculoskeletal system: Secondary | ICD-10-CM

## 2017-07-04 DIAGNOSIS — M25512 Pain in left shoulder: Secondary | ICD-10-CM | POA: Diagnosis not present

## 2017-07-04 DIAGNOSIS — M25612 Stiffness of left shoulder, not elsewhere classified: Secondary | ICD-10-CM | POA: Diagnosis not present

## 2017-07-04 NOTE — Therapy (Signed)
[place], [place], [address] Phone: [phone]   Fax:  [phone]  Occupational Therapy Treatment  Patient Details  Name: Carla Wise MRN: [id number] Date of Birth: 03/13/1966 Referring Provider: [name], MD  Encounter Date: 07/04/2017      OT End of Session - 07/04/17 1110    Visit Number 23   Number of Visits 27   Date for OT Re-Evaluation 07/10/17   Authorization Type BCBS    Authorization Time Period 30 visit limit with 0 used this year.   Authorization - Visit Number 23   Authorization - Number of Visits 30   OT Start Time 9233   OT Stop Time 1113   OT Time Calculation (min) 41 min   Activity Tolerance Patient tolerated treatment well   Behavior During Therapy WFL for tasks assessed/performed      Past Medical History:  Diagnosis Date  . Constipation   . Gastric adenoma 02/2017  . PONV (postoperative nausea and vomiting)     Past Surgical History:  Procedure Laterality Date  . ESOPHAGOGASTRODUODENOSCOPY N/A 03/01/2017   Dr. Oneida Alar: benign-appearing peptic stricture s/p dilation, gastric polyp (tubular adenoma), gastritis, duodenal diverticulm  . EXAM UNDER ANESTHESIA WITH MANIPULATION OF SHOULDER Left 06/06/2017   Procedure: EXAM UNDER ANESTHESIA WITH MANIPULATION OF SHOULDER;  Surgeon: Carole Civil, MD;  Location: [facility];  Service: Orthopedics;  Laterality: Left;  full relaxation  . FOOT SURGERY Left 2003   hammer toe- great toe  . KNEE ARTHROSCOPY W/ MENISCECTOMY     left knee  . SAVORY DILATION N/A 03/01/2017   Procedure: SAVORY DILATION;  Surgeon: Danie Binder, MD;  Location: [facility];  Service: Endoscopy;  Laterality: N/A;    There were no vitals filed for this visit.      Subjective Assessment - 07/04/17 1040    Subjective  S: I already had heat on my shoulder today trying to loosen up the muscles before therapy.   Currently in Pain? Yes   Pain Score 4    Pain Location Shoulder   Pain Orientation Left   Pain Descriptors / Indicators Constant;Sore   Pain Type Acute pain   Pain Radiating Towards N/A   Pain Onset More than a month ago   Pain Frequency Constant   Aggravating Factors  using it   Pain Relieving Factors ice, heat   Effect of Pain on Daily Activities severe   Multiple Pain Sites No            [facility] OT Assessment - 07/04/17 1041      Assessment   Diagnosis Left shoulder adhesive capsulitis     Precautions   Precautions None            OT Treatments/Exercises (OP) - 07/04/17 1041      Exercises   Exercises Shoulder     Shoulder Exercises: Supine   Protraction PROM;Strengthening;12 reps   Protraction Weight (lbs) 2   Horizontal ABduction PROM;Strengthening;12 reps   Horizontal ABduction Weight (lbs) 2   External Rotation PROM;Strengthening;12 reps   External Rotation Weight (lbs) 2   Internal Rotation PROM;Strengthening;12 reps   Internal Rotation Weight (lbs) 2   Flexion PROM;Strengthening;12 reps   Shoulder Flexion Weight (lbs) 2   ABduction PROM;Strengthening;12 reps   Shoulder ABduction Weight (lbs) 2     Shoulder Exercises: Standing   Protraction Strengthening;10 reps   Protraction Weight (lbs) 2   Horizontal ABduction Strengthening;10 reps  Horizontal ABduction Weight (lbs) 2   External Rotation Strengthening;10 reps   External Rotation Weight (lbs) 2   Internal Rotation Strengthening;10 reps   Internal Rotation Weight (lbs) 2   Flexion Strengthening;10 reps   Shoulder Flexion Weight (lbs) 2   ABduction Strengthening;10 reps   Shoulder ABduction Weight (lbs) 2     Shoulder Exercises: ROM/Strengthening   UBE (Upper Arm Bike) 6' backwards, level 3   Over Head Lace 2'   Proximal Shoulder Strengthening, Seated standing, 10X each, no rest breaks, 2#     Manual Therapy   Manual Therapy Myofascial release;Muscle Energy Technique   Manual therapy comments manual therapy completed prior to  exercises.    Myofascial Release Myofascial release and manual stretching completed to left upper arm, trapezius, and scapular region to decrease fascial restrictions and increase joint mobility in a pain free zone.    Muscle Energy Technique Muscle energy technique completed to left anterior and middle deltoid to relax tone and improve range of motion.             OT Education - 07/04/17 1109    Education provided No          OT Short Term Goals - 06/19/17 1312      OT SHORT TERM GOAL #1   Title Patient will be educated and independent with HEP to increase functional use of LUE during daily tasks.    Time 3   Period Weeks     OT SHORT TERM GOAL #2   Title Patient will increase P/ROM to [facility] to increase ability to get shirts on and off with less difficulty.   Baseline 05/16/17: She can donn and doff her shirts but with the use of compensatory strategies.   Time 3   Period Weeks   Status Partially Met     OT SHORT TERM GOAL #3   Title Patient will increase LUE strength to 3/5 to increase functional reaching ability to increase ability to brush hair.   Time 3   Period Weeks     OT SHORT TERM GOAL #4   Title Patient will decrease pain level in LUE to 5/10 during light household tasks.    Baseline 05/16/17: Her pain typically stays at a 6/10.   Time 3   Period Weeks     OT SHORT TERM GOAL #5   Title patient will decrease fascial restrictions to mod amount to increase ability to complete tasks at shoulder with less difficulty.   Time 3   Period Weeks           OT Long Term Goals - 06/19/17 1312      OT LONG TERM GOAL #1   Title Patient will return to highest level of independence when using LUE for all daily and work related tasks.    Time 6   Period Weeks   Status On-going     OT LONG TERM GOAL #2   Title Patient will decrease fascial restrictions to min amount or less in order to increase functional reaching ability.   Time 6   Period Weeks     OT LONG TERM  GOAL #3   Title Patient will decrease pain level in LUE during work activities to a 3/10 or less.   Time 6   Period Weeks   Status On-going     OT LONG TERM GOAL #4   Title Patient will increase A/ROM to [facility] to increase ability to hook and unhook bra.  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   Time 6   Period Weeks               Plan - 07/04/17 1111    Clinical Impression Statement A: Session continued with manual techniques and stretches to increase ROM. Progressed to Standing shoulder strengthening with 2# weight. Added overhead lacing with VC needed for form and technique.    Plan P: Continue with manual techniques to increase ROM. Attempt prone and sidelying strengthening.      Patient will benefit from skilled therapeutic intervention in order to improve the following deficits and impairments:  Pain, Decreased range of motion, Decreased strength, Increased fascial restricitons, Impaired UE functional use  Visit Diagnosis: Acute pain of left shoulder  Other symptoms and signs involving the musculoskeletal system  Stiffness of left shoulder, not elsewhere classified    Problem List Patient Active Problem List   Diagnosis Date Noted  . Adhesive capsulitis of left shoulder   . Dyspepsia and disorder of function of stomach   . Constipation 02/15/2017  . Dysphagia 02/15/2017  . Encounter for screening colonoscopy 02/15/2017  . Arthritis of knee, degenerative 03/09/2014  . Knee pain 08/21/2011  . Stiffness of joint, not elsewhere classified, lower leg 08/21/2011  . Medial meniscus, posterior horn derangement 08/02/2011  . Torn meniscus 07/19/2011  . HIP PAIN 02/16/2009    Luther Hearing, OT Student [phone] 07/04/2017, 11:15 AM  [place] [address] [place],  [place], [address] Phone: [phone]   Fax:  [phone]  Name: Carla Wise MRN: [id number] Date of Birth: 1966-06-30   Note reviewed by clinical instructor and accurately reflects treatment session.   Ailene Ravel, OTR/L,CBIS  [phone]

## 2017-07-10 ENCOUNTER — Ambulatory Visit (HOSPITAL_COMMUNITY): Payer: BLUE CROSS/BLUE SHIELD | Attending: Orthopedic Surgery

## 2017-07-10 ENCOUNTER — Encounter (HOSPITAL_COMMUNITY): Payer: Self-pay

## 2017-07-10 DIAGNOSIS — M25512 Pain in left shoulder: Secondary | ICD-10-CM | POA: Diagnosis not present

## 2017-07-10 DIAGNOSIS — R29898 Other symptoms and signs involving the musculoskeletal system: Secondary | ICD-10-CM | POA: Insufficient documentation

## 2017-07-10 DIAGNOSIS — M25612 Stiffness of left shoulder, not elsewhere classified: Secondary | ICD-10-CM | POA: Diagnosis not present

## 2017-07-10 NOTE — Therapy (Signed)
[place], [place], [address] Phone: [phone]   Fax:  [phone]  Occupational Therapy Treatment  Patient Details  Name: Carla Wise MRN: [id number] Date of Birth: 1966/03/15 Referring Provider: [name], MD  Encounter Date: 07/10/2017      OT End of Session - 07/10/17 1152    Visit Number 24   Number of Visits 27   Date for OT Re-Evaluation 08/09/17   Authorization Type BC[employer]    Authorization Time Period 30 visit limit with 0 used this year.   Authorization - Visit Number 24   Authorization - Number of Visits 30   OT Start Time 3220   OT Stop Time 1202   OT Time Calculation (min) 46 min   Activity Tolerance Patient tolerated treatment well   Behavior During Therapy WFL for tasks assessed/performed      Past Medical History:  Diagnosis Date  . Constipation   . Gastric adenoma 02/2017  . PONV (postoperative nausea and vomiting)     Past Surgical History:  Procedure Laterality Date  . ESOPHAGOGASTRODUODENOSCOPY N/A 03/01/2017   Dr. Oneida Alar: benign-appearing peptic stricture s/p dilation, gastric polyp (tubular adenoma), gastritis, duodenal diverticulm  . EXAM UNDER ANESTHESIA WITH MANIPULATION OF SHOULDER Left 06/06/2017   Procedure: EXAM UNDER ANESTHESIA WITH MANIPULATION OF SHOULDER;  Surgeon: Carole Civil, MD;  Location: [facility];  Service: Orthopedics;  Laterality: Left;  full relaxation  . FOOT SURGERY Left 2003   hammer toe- great toe  . KNEE ARTHROSCOPY W/ MENISCECTOMY     left knee  . SAVORY DILATION N/A 03/01/2017   Procedure: SAVORY DILATION;  Surgeon: Danie Binder, MD;  Location: [facility];  Service: Endoscopy;  Laterality: N/A;    There were no vitals filed for this visit.      Subjective Assessment - 07/10/17 1121    Subjective  S: I'm okay if we start spacing my appointments out more.   Currently in Pain? Yes   Pain Score 4    Pain Location Shoulder   Pain  Orientation Left   Pain Descriptors / Indicators Constant;Sore   Pain Type Acute pain   Pain Radiating Towards N/A   Pain Onset More than a month ago   Pain Frequency Constant   Aggravating Factors  using it   Pain Relieving Factors ice, heat   Effect of Pain on Daily Activities sevre   Multiple Pain Sites No            [facility] OT Assessment - 07/10/17 1124      Assessment   Diagnosis Left shoulder adhesive capsulitis     Precautions   Precautions None     ROM / Strength   AROM / PROM / Strength AROM;PROM;Strength     AROM   Overall AROM Comments Assessed seated. IR/er abducted.   AROM Assessment Site Shoulder   Right/Left Shoulder Left   Left Shoulder Flexion 126 Degrees  previous: 124   Left Shoulder ABduction 125 Degrees  previous: 122   Left Shoulder Internal Rotation 75 Degrees  previous: 90 adducted   Left Shoulder External Rotation 52 Degrees  previous: 58 adducted     PROM   Overall PROM Comments Assessed supine. IR/er abducted   PROM Assessment Site Shoulder   Right/Left Shoulder Left   Left Shoulder Flexion 141 Degrees  previous: 141   Left Shoulder ABduction 149 Degrees  previous: 135   Left Shoulder Internal Rotation 90 Degrees  same as previous   Left Shoulder External Rotation 60 Degrees  previous: 62 adducted     Strength   Overall Strength Comments Assessed seated. IR/er abducted   Strength Assessment Site Shoulder   Right/Left Shoulder Left   Left Shoulder Flexion 4+/5  previous: 4+/5   Left Shoulder ABduction 4/5  previous: 4-/5   Left Shoulder Internal Rotation 4/5  previous: 4/5   Left Shoulder External Rotation 4/5  previous: 4-/5                  OT Treatments/Exercises (OP) - 07/10/17 1124      Exercises   Exercises Shoulder     Shoulder Exercises: Supine   Protraction PROM;5 reps   Horizontal ABduction PROM;5 reps   External Rotation PROM;5 reps   Internal Rotation PROM;5 reps   Flexion PROM;5 reps    ABduction PROM;5 reps     Shoulder Exercises: Prone   Other Prone Exercises Prone Hughston exercises 10X each     Shoulder Exercises: Sidelying   External Rotation Strengthening;12 reps   External Rotation Weight (lbs) 2   Internal Rotation Strengthening;12 reps   Internal Rotation Weight (lbs) 2   Flexion Strengthening;12 reps   Flexion Weight (lbs) 1   ABduction Strengthening;12 reps   ABduction Weight (lbs) 2   Other Sidelying Exercises protraction 12X, 2#     Shoulder Exercises: Stretch   Other Shoulder Stretches Sleeper stretch; internal and external rotation; 1 set; 1 minute each     Manual Therapy   Manual Therapy Myofascial release;Muscle Energy Technique   Manual therapy comments manual therapy completed prior to exercises.    Myofascial Release Myofascial release and manual stretching completed to left upper arm, trapezius, and scapular region to decrease fascial restrictions and increase joint mobility in a pain free zone.    Muscle Energy Technique Muscle energy technique completed to left anterior and middle deltoid to relax tone and improve range of motion.                 OT Education - 07/10/17 1203    Education provided Yes   Education Details Educated pt on number of remaining visits and spacing the visits out   Person(s) Educated Patient   Methods Explanation   Comprehension Verbalized understanding          OT Short Term Goals - 07/10/17 1153      OT SHORT TERM GOAL #1   Title Patient will be educated and independent with HEP to increase functional use of LUE during daily tasks.    Time 3   Period Weeks     OT SHORT TERM GOAL #2   Title Patient will increase P/ROM to WFL to increase ability to get shirts on and off with less difficulty.   Baseline 05/16/17: She can donn and doff her shirts but with the use of compensatory strategies.   Time 3   Period Weeks   Status Achieved     OT SHORT TERM GOAL #3   Title Patient will increase LUE  strength to 3/5 to increase functional reaching ability to increase ability to brush hair.   Time 3   Period Weeks     OT SHORT TERM GOAL #4   Title Patient will decrease pain level in LUE to 5/10 during light household tasks.    Baseline 05/16/17: Her pain typically stays at a 6/10.   Time 3   Period Weeks     OT SHORT TERM GOAL #  5   Title patient will decrease fascial restrictions to mod amount to increase ability to complete tasks at shoulder with less difficulty.   Time 3   Period Weeks           OT Long Term Goals - 07/10/17 1154      OT LONG TERM GOAL #1   Title Patient will return to highest level of independence when using LUE for all daily and work related tasks.    Time 6   Period Weeks   Status Partially Met     OT LONG TERM GOAL #2   Title Patient will decrease fascial restrictions to min amount or less in order to increase functional reaching ability.   Time 6   Period Weeks     OT LONG TERM GOAL #3   Title Patient will decrease pain level in LUE during work activities to a 3/10 or less.   Time 6   Period Weeks   Status Partially Met     OT LONG TERM GOAL #4   Title Patient will increase A/ROM to WFL to increase ability to hook and unhook bra.   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   Time 6   Period Weeks               Plan - 07/10/17 1155    Clinical Impression Statement A: Session focused on reassesment. Pt achieved one short term goal with all other short term goals already previously achieved. Pt has partially met three long term goals with the rest previously achieved. Pt reports the motions of reaching out and up have gotten easier however reaching behind her back still presents as difficult.  Pt completed shoulder strengthening in sidelying and prone Hugh[place] exercises with VC needed for form and  technique. Shoulder flexion in sidelying presented as mod difficulty for pt.    Plan P: Continue with manual techniques to increase ROM and sidelying/prone strengthening. Complete FOTO.      Patient will benefit from skilled therapeutic intervention in order to improve the following deficits and impairments:  Pain, Decreased range of motion, Decreased strength, Increased fascial restricitons, Impaired UE functional use  Visit Diagnosis: Acute pain of left shoulder  Other symptoms and signs involving the musculoskeletal system  Stiffness of left shoulder, not elsewhere classified    Problem List Patient Active Problem List   Diagnosis Date Noted  . Adhesive capsulitis of left shoulder   . Dyspepsia and disorder of function of stomach   . Constipation 02/15/2017  . Dysphagia 02/15/2017  . Encounter for screening colonoscopy 02/15/2017  . Arthritis of knee, degenerative 03/09/2014  . Knee pain 08/21/2011  . Stiffness of joint, not elsewhere classified, lower leg 08/21/2011  . Medial meniscus, posterior horn derangement 08/02/2011  . Torn meniscus 07/19/2011  . HIP PAIN 02/16/2009    Christina Brown, OT Student [phone] 07/10/2017, 12:16 PM  [facility] [address] [place], [place], [address] Phone: [phone]   Fax:  [phone]  Name: Carla Wise MRN: [id number] Date of Birth: 06/29/1966   Note reviewed by clinical instructor and accurately reflects treatment session.   Laura Essenmacher, OTR/L,CBIS  [phone]  

## 2017-07-12 ENCOUNTER — Ambulatory Visit (HOSPITAL_COMMUNITY): Payer: BLUE CROSS/BLUE SHIELD

## 2017-07-15 ENCOUNTER — Ambulatory Visit (INDEPENDENT_AMBULATORY_CARE_PROVIDER_SITE_OTHER): Payer: BLUE CROSS/BLUE SHIELD | Admitting: Orthopedic Surgery

## 2017-07-15 ENCOUNTER — Encounter (HOSPITAL_COMMUNITY): Payer: Self-pay

## 2017-07-15 ENCOUNTER — Ambulatory Visit (HOSPITAL_COMMUNITY): Payer: BLUE CROSS/BLUE SHIELD

## 2017-07-15 DIAGNOSIS — M25512 Pain in left shoulder: Secondary | ICD-10-CM

## 2017-07-15 DIAGNOSIS — Z4889 Encounter for other specified surgical aftercare: Secondary | ICD-10-CM

## 2017-07-15 DIAGNOSIS — R29898 Other symptoms and signs involving the musculoskeletal system: Secondary | ICD-10-CM | POA: Diagnosis not present

## 2017-07-15 DIAGNOSIS — M7502 Adhesive capsulitis of left shoulder: Secondary | ICD-10-CM

## 2017-07-15 DIAGNOSIS — M25612 Stiffness of left shoulder, not elsewhere classified: Secondary | ICD-10-CM | POA: Diagnosis not present

## 2017-07-15 NOTE — Therapy (Signed)
[place], [place], [address] Phone: [phone]   Fax:  [phone]  Occupational Therapy Treatment  Patient Details  Name: Carla Wise MRN: [id number] Date of Birth: 29-Mar-1966 Referring Provider: [name], MD  Encounter Date: 07/15/2017      OT End of Session - 07/15/17 1231    Visit Number 25   Number of Visits 27   Date for OT Re-Evaluation 08/09/17   Authorization Type BCBS    Authorization Time Period 30 visit limit with 0 used this year.   Authorization - Visit Number 25   Authorization - Number of Visits 30   OT Start Time 7035   OT Stop Time 1207   OT Time Calculation (min) 44 min   Activity Tolerance Patient tolerated treatment well   Behavior During Therapy WFL for tasks assessed/performed      Past Medical History:  Diagnosis Date  . Constipation   . Gastric adenoma 02/2017  . PONV (postoperative nausea and vomiting)     Past Surgical History:  Procedure Laterality Date  . ESOPHAGOGASTRODUODENOSCOPY N/A 03/01/2017   Dr. Oneida Alar: benign-appearing peptic stricture s/p dilation, gastric polyp (tubular adenoma), gastritis, duodenal diverticulm  . EXAM UNDER ANESTHESIA WITH MANIPULATION OF SHOULDER Left 06/06/2017   Procedure: EXAM UNDER ANESTHESIA WITH MANIPULATION OF SHOULDER;  Surgeon: Carole Civil, MD;  Location: [facility];  Service: Orthopedics;  Laterality: Left;  full relaxation  . FOOT SURGERY Left 2003   hammer toe- great toe  . KNEE ARTHROSCOPY W/ MENISCECTOMY     left knee  . SAVORY DILATION N/A 03/01/2017   Procedure: SAVORY DILATION;  Surgeon: Danie Binder, MD;  Location: [facility];  Service: Endoscopy;  Laterality: N/A;    There were no vitals filed for this visit.      Subjective Assessment - 07/15/17 1226    Subjective  S: I have made a lot of progress and I'm feeling really good.   Currently in Pain? Yes   Pain Score 3    Pain Location Shoulder   Pain Orientation Left   Pain Descriptors / Indicators Constant;Sore   Pain Type Acute pain   Pain Radiating Towards N/A   Pain Onset More than a month ago   Pain Frequency Constant   Aggravating Factors  using it   Pain Relieving Factors ice, heat   Effect of Pain on Daily Activities severe   Multiple Pain Sites No            [facility] OT Assessment - 07/15/17 1203      Assessment   Diagnosis Left shoulder adhesive capsulitis     Precautions   Precautions None                  OT Treatments/Exercises (OP) - 07/15/17 1146      Exercises   Exercises Shoulder     Shoulder Exercises: Supine   Protraction PROM;10 reps   Horizontal ABduction PROM;10 reps   External Rotation PROM;10 reps   Internal Rotation PROM;10 reps   Flexion PROM;10 reps   ABduction PROM;10 reps     Shoulder Exercises: Prone   Other Prone Exercises Prone Hughston exercises 12X each     Shoulder Exercises: Sidelying   External Rotation Strengthening;12 reps   External Rotation Weight (lbs) 2   Internal Rotation Strengthening;12 reps   Internal Rotation Weight (lbs) 2   Flexion Strengthening;12 reps   Flexion Weight (lbs) 2   ABduction  Strengthening;12 reps   ABduction Weight (lbs) 2   Other Sidelying Exercises protraction 12X, 2#     Manual Therapy   Manual Therapy Myofascial release;Muscle Energy Technique   Manual therapy comments manual therapy completed prior to exercises.    Myofascial Release Myofascial release and manual stretching completed to left upper arm, trapezius, and scapular region to decrease fascial restrictions and increase joint mobility in a pain free zone.    Muscle Energy Technique Muscle energy technique completed to left anterior and middle deltoid to relax tone and improve range of motion.                 OT Education - 07/15/17 1231    Education provided No          OT Short Term Goals - 07/15/17 1232      OT SHORT TERM GOAL #1   Title  Patient will be educated and independent with HEP to increase functional use of LUE during daily tasks.    Time 3   Period Weeks     OT SHORT TERM GOAL #2   Title Patient will increase P/ROM to [facility] to increase ability to get shirts on and off with less difficulty.   Baseline 05/16/17: She can donn and doff her shirts but with the use of compensatory strategies.   Time 3   Period Weeks     OT SHORT TERM GOAL #3   Title Patient will increase LUE strength to 3/5 to increase functional reaching ability to increase ability to brush hair.   Time 3   Period Weeks     OT SHORT TERM GOAL #4   Title Patient will decrease pain level in LUE to 5/10 during light household tasks.    Baseline 05/16/17: Her pain typically stays at a 6/10.   Time 3   Period Weeks     OT SHORT TERM GOAL #5   Title patient will decrease fascial restrictions to mod amount to increase ability to complete tasks at shoulder with less difficulty.   Time 3   Period Weeks           OT Long Term Goals - 07/15/17 1232      OT LONG TERM GOAL #1   Title Patient will return to highest level of independence when using LUE for all daily and work related tasks.    Time 6   Period Weeks   Status Partially Met     OT LONG TERM GOAL #2   Title Patient will decrease fascial restrictions to min amount or less in order to increase functional reaching ability.   Time 6   Period Weeks     OT LONG TERM GOAL #3   Title Patient will decrease pain level in LUE during work activities to a 3/10 or less.   Time 6   Period Weeks   Status Partially Met     OT LONG TERM GOAL #4   Title Patient will increase A/ROM to [facility] to increase ability to hook and unhook bra.   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   Time 6   Period Weeks               Plan - 07/15/17 1233    Clinical  Impression Statement A:  Completed P/ROM shoulder exercises to increase ROM. Pt completed shoulder strengthening sidelying and prone Hughston exercises with VC needed for form and technique. Increased prone Hughston exercises repetitions. Pt reports receiving a good report about her arm from the doctor this morning.   Plan P: Continue with manual techniques to increase ROM and sidelying strengthening. Add 1# weight to prone Hughston exercises.      Patient will benefit from skilled therapeutic intervention in order to improve the following deficits and impairments:  Pain, Decreased range of motion, Decreased strength, Increased fascial restricitons, Impaired UE functional use  Visit Diagnosis: Acute pain of left shoulder  Other symptoms and signs involving the musculoskeletal system  Stiffness of left shoulder, not elsewhere classified    Problem List Patient Active Problem List   Diagnosis Date Noted  . Adhesive capsulitis of left shoulder   . Dyspepsia and disorder of function of stomach   . Constipation 02/15/2017  . Dysphagia 02/15/2017  . Encounter for screening colonoscopy 02/15/2017  . Arthritis of knee, degenerative 03/09/2014  . Knee pain 08/21/2011  . Stiffness of joint, not elsewhere classified, lower leg 08/21/2011  . Medial meniscus, posterior horn derangement 08/02/2011  . Torn meniscus 07/19/2011  . HIP PAIN 02/16/2009    Luther Hearing, OT Student [phone] 07/15/2017, 12:38 PM  [facility] [address] [place], [place], [address] Phone: [phone]   Fax:  [phone]  Name: Carla Wise MRN: [id number] Date of Birth: 10/18/1966   Note reviewed by clinical instructor and accurately reflects treatment session.   Ailene Ravel, OTR/L,CBIS  [phone]

## 2017-07-15 NOTE — Progress Notes (Signed)
Follow-up  Chief complaint recheck left shoulder after manipulation for adhesive capsulitis   date of surgery June 28, postoperative day 39  Preop diagnosis adhesive capsulitis left shoulder Postop diagnosis same Procedure manipulation and examination under [id number] Surgeon Aline Brochure Anesthesia Gen.  Findings once the patient was under anesthesia her external rotation with her arm at her side was 15 her abduction was 75 and her flexion was 90  At the end of the procedure her abduction was 120 her external rotation was 50   Today she has passive range of motion 150 active 120 flexion, she has 45 external rotation. She still has some soreness over the left deltoid and she has difficulty sleeping at night  Recommend continue therapy for 5 more visits  She lost her job  Return in 3 weeks   Encounter Diagnoses  Name Primary?  . Adhesive capsulitis of left shoulder Yes  . Aftercare following surgery

## 2017-07-16 ENCOUNTER — Telehealth: Payer: Self-pay | Admitting: Orthopedic Surgery

## 2017-07-16 ENCOUNTER — Telehealth: Payer: Self-pay | Admitting: Gastroenterology

## 2017-07-16 NOTE — Telephone Encounter (Signed)
Can we see if things have settled and able to pursue colonoscopy?   Needs EGD in June 2019 for surveillance of gastric polyp (adenoma).

## 2017-07-16 NOTE — Telephone Encounter (Signed)
Please advise she would need to contact her primary care MD for this

## 2017-07-16 NOTE — Telephone Encounter (Signed)
Patient requests something called in to her pharmacy to help her sleep.

## 2017-07-16 NOTE — Telephone Encounter (Signed)
Patient advised of response to her request

## 2017-07-17 NOTE — Telephone Encounter (Signed)
LMOM for a return call.  

## 2017-07-18 ENCOUNTER — Ambulatory Visit (HOSPITAL_COMMUNITY): Payer: BLUE CROSS/BLUE SHIELD

## 2017-07-18 ENCOUNTER — Encounter (HOSPITAL_COMMUNITY): Payer: Self-pay

## 2017-07-18 DIAGNOSIS — M25612 Stiffness of left shoulder, not elsewhere classified: Secondary | ICD-10-CM

## 2017-07-18 DIAGNOSIS — R29898 Other symptoms and signs involving the musculoskeletal system: Secondary | ICD-10-CM

## 2017-07-18 DIAGNOSIS — M25512 Pain in left shoulder: Secondary | ICD-10-CM

## 2017-07-18 NOTE — Therapy (Signed)
[place], [place], [address] Phone: [phone]   Fax:  [phone]  Occupational Therapy Treatment  Patient Details  Name: Carla Wise MRN: [id number] Date of Birth: 1966-11-18 Referring Provider: [name], MD  Encounter Date: 07/18/2017      OT End of Session - 07/18/17 1138    Visit Number 26   Number of Visits 30   Date for OT Re-Evaluation 08/09/17   Authorization Type BCBS    Authorization Time Period 30 visit limit with 0 used this year.   Authorization - Visit Number 26   Authorization - Number of Visits 30   OT Start Time 3335   OT Stop Time 1116   OT Time Calculation (min) 44 min   Activity Tolerance Patient tolerated treatment well   Behavior During Therapy WFL for tasks assessed/performed      Past Medical History:  Diagnosis Date  . Constipation   . Gastric adenoma 02/2017  . PONV (postoperative nausea and vomiting)     Past Surgical History:  Procedure Laterality Date  . ESOPHAGOGASTRODUODENOSCOPY N/A 03/01/2017   Dr. Oneida Alar: benign-appearing peptic stricture s/p dilation, gastric polyp (tubular adenoma), gastritis, duodenal diverticulm  . EXAM UNDER ANESTHESIA WITH MANIPULATION OF SHOULDER Left 06/06/2017   Procedure: EXAM UNDER ANESTHESIA WITH MANIPULATION OF SHOULDER;  Surgeon: Carole Civil, MD;  Location: [facility];  Service: Orthopedics;  Laterality: Left;  full relaxation  . FOOT SURGERY Left 2003   hammer toe- great toe  . KNEE ARTHROSCOPY W/ MENISCECTOMY     left knee  . SAVORY DILATION N/A 03/01/2017   Procedure: SAVORY DILATION;  Surgeon: Danie Binder, MD;  Location: [facility];  Service: Endoscopy;  Laterality: N/A;    There were no vitals filed for this visit.      Subjective Assessment - 07/18/17 1053    Subjective  S: Look at where I am now compared to when I came in.   Currently in Pain? Yes   Pain Score 4    Pain Location Shoulder   Pain  Orientation Left   Pain Descriptors / Indicators Constant;Sore   Pain Type Acute pain   Pain Radiating Towards N/A   Pain Onset More than a month ago   Pain Frequency Constant   Aggravating Factors  using it   Pain Relieving Factors ice, heat   Effect of Pain on Daily Activities severe   Multiple Pain Sites No            [facility] OT Assessment - 07/18/17 1055      Assessment   Diagnosis Left shoulder adhesive capsulitis     Precautions   Precautions None                  OT Treatments/Exercises (OP) - 07/18/17 1055      Exercises   Exercises Shoulder     Shoulder Exercises: Supine   Protraction PROM;10 reps;Strengthening;15 reps   Protraction Weight (lbs) 2   Horizontal ABduction PROM;10 reps;Strengthening;15 reps   Horizontal ABduction Weight (lbs) 2   External Rotation PROM;10 reps;Strengthening;15 reps   External Rotation Weight (lbs) 2   Internal Rotation PROM;10 reps;Strengthening;15 reps   Internal Rotation Weight (lbs) 2   Flexion PROM;10 reps;Strengthening;15 reps   Shoulder Flexion Weight (lbs) 2   ABduction PROM;10 reps;Strengthening;15 reps   Shoulder ABduction Weight (lbs) 2     Shoulder Exercises: Prone   Other Prone Exercises Prone Hughston exercises 10X  each, 1#     Shoulder Exercises: Standing   External Rotation Theraband;12 reps   Theraband Level (Shoulder External Rotation) Level 2 (Red)   Extension Theraband;12 reps   Theraband Level (Shoulder Extension) Level 2 (Red)   Row Theraband;12 reps   Theraband Level (Shoulder Row) Level 2 (Red)   Retraction Theraband;12 reps   Theraband Level (Shoulder Retraction) Level 2 (Red)     Shoulder Exercises: ROM/Strengthening   UBE (Upper Arm Bike) 5' backwards, level 3     Manual Therapy   Manual Therapy Myofascial release;Muscle Energy Technique   Manual therapy comments manual therapy completed prior to exercises.    Myofascial Release Myofascial release and manual stretching completed  to left upper arm, trapezius, and scapular region to decrease fascial restrictions and increase joint mobility in a pain free zone.    Muscle Energy Technique Muscle energy technique completed to left anterior and middle deltoid to relax tone and improve range of motion.                 OT Education - 07/18/17 1054    Education provided No          OT Short Term Goals - 07/15/17 1232      OT SHORT TERM GOAL #1   Title Patient will be educated and independent with HEP to increase functional use of LUE during daily tasks.    Time 3   Period Weeks     OT SHORT TERM GOAL #2   Title Patient will increase P/ROM to [facility] to increase ability to get shirts on and off with less difficulty.   Baseline 05/16/17: She can donn and doff her shirts but with the use of compensatory strategies.   Time 3   Period Weeks     OT SHORT TERM GOAL #3   Title Patient will increase LUE strength to 3/5 to increase functional reaching ability to increase ability to brush hair.   Time 3   Period Weeks     OT SHORT TERM GOAL #4   Title Patient will decrease pain level in LUE to 5/10 during light household tasks.    Baseline 05/16/17: Her pain typically stays at a 6/10.   Time 3   Period Weeks     OT SHORT TERM GOAL #5   Title patient will decrease fascial restrictions to mod amount to increase ability to complete tasks at shoulder with less difficulty.   Time 3   Period Weeks           OT Long Term Goals - 07/15/17 1232      OT LONG TERM GOAL #1   Title Patient will return to highest level of independence when using LUE for all daily and work related tasks.    Time 6   Period Weeks   Status Partially Met     OT LONG TERM GOAL #2   Title Patient will decrease fascial restrictions to min amount or less in order to increase functional reaching ability.   Time 6   Period Weeks     OT LONG TERM GOAL #3   Title Patient will decrease pain level in LUE during work activities to a 3/10 or less.    Time 6   Period Weeks   Status Partially Met     OT LONG TERM GOAL #4   Title Patient will increase A/ROM to [facility] to increase ability to hook and unhook bra.   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   Time 6   Period Weeks               Plan - 07/18/17 1139    Clinical Impression Statement A: Pt presented with significantly increased ROM during today's session. Pt completed shoulder strengthening supine and prone Hughston exercises with VC needed for form and technique. Added scapular theraband exercises and shoulder external rotation on theraband.   Plan P: Continue with sidelying strengthening and prone Hughston strengthening. Increase repetitions if pt can tolerate.      Patient will benefit from skilled therapeutic intervention in order to improve the following deficits and impairments:  Pain, Decreased range of motion, Decreased strength, Increased fascial restricitons, Impaired UE functional use  Visit Diagnosis: Acute pain of left shoulder  Other symptoms and signs involving the musculoskeletal system  Stiffness of left shoulder, not elsewhere classified    Problem List Patient Active Problem List   Diagnosis Date Noted  . Adhesive capsulitis of left shoulder   . Dyspepsia and disorder of function of stomach   . Constipation 02/15/2017  . Dysphagia 02/15/2017  . Encounter for screening colonoscopy 02/15/2017  . Arthritis of knee, degenerative 03/09/2014  . Knee pain 08/21/2011  . Stiffness of joint, not elsewhere classified, lower leg 08/21/2011  . Medial meniscus, posterior horn derangement 08/02/2011  . Torn meniscus 07/19/2011  . HIP PAIN 02/16/2009    Luther Hearing, OT Student [phone] 07/18/2017, 11:50 AM  [place], [place],  [address] Phone: [phone]   Fax:  [phone]  Name: Carla Wise MRN: [id number] Date of Birth: 1966-10-18    Note reviewed by clinical instructor and accurately reflects treatment session.   Ailene Ravel, OTR/L,CBIS  [phone]

## 2017-07-18 NOTE — Telephone Encounter (Signed)
Pt is having shoulder problems and she may need to have another surgery. She will call us when everything is better.

## 2017-07-18 NOTE — Telephone Encounter (Signed)
Forwarding FYI to AB.

## 2017-07-19 ENCOUNTER — Ambulatory Visit (INDEPENDENT_AMBULATORY_CARE_PROVIDER_SITE_OTHER): Payer: BLUE CROSS/BLUE SHIELD | Admitting: Nurse Practitioner

## 2017-07-19 ENCOUNTER — Encounter: Payer: Self-pay | Admitting: Nurse Practitioner

## 2017-07-19 VITALS — BP 126/80 | Ht 61.0 in | Wt 105.4 lb

## 2017-07-19 DIAGNOSIS — M62838 Other muscle spasm: Secondary | ICD-10-CM | POA: Diagnosis not present

## 2017-07-19 DIAGNOSIS — G4700 Insomnia, unspecified: Secondary | ICD-10-CM | POA: Diagnosis not present

## 2017-07-19 MED ORDER — ALPRAZOLAM 0.5 MG PO TABS: 0.5000 mg | ORAL_TABLET | Freq: Every evening | ORAL | 2 refills | Status: DC | PRN

## 2017-07-19 NOTE — Progress Notes (Signed)
Subjective:  Presents for c/o sleep disturbance. Has difficulty going to sleep with frequent awakenings. 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 Currently in PT. Has tried multiple OTC products including Melatonin 10 mg with no relief. Took a few of the Hydrocodone but stopped this because it made her sick.   Objective:   BP 126/80   Ht 5\' 1"  (1.549 m)   Wt 105 lb 6 oz (47.8 kg)   BMI 19.91 kg/m  NAD. Alert, oriented. Lungs clear. Heart RRR. Extremely tight tender muscles noted in the left trapezius, cervical and rhomboid areas (PT has also told her this).   Assessment:  Muscle spasms of neck  Insomnia, unspecified type    Plan:   Meds ordered this encounter  Medications  . DISCONTD: HYDROcodone-acetaminophen (NORCO/VICODIN) 5-325 MG tablet    Refill:  0  . ALPRAZolam (XANAX) 0.5 MG tablet    Sig: Take 1 tablet (0.5 mg total) by mouth at bedtime as needed for sleep.    Dispense:  30 tablet    Refill:  2    Order Specific Question:   Supervising Provider    Answer:   Mikey Kirschner [[id number]]   Stop Hydrocodone. Has taken Xanax in the past without difficulty.    TENS unit Icy hot smart relief Lidocaine patch Consider massage therapy Call back if no improvement.

## 2017-07-19 NOTE — Patient Instructions (Addendum)
TENS unit Icy hot smart relief Lidocaine patch Consider massage therapy Conan Bowens at [employer] chi[facility]ractor

## 2017-07-20 ENCOUNTER — Encounter: Payer: Self-pay | Admitting: Nurse Practitioner

## 2017-07-23 ENCOUNTER — Ambulatory Visit (HOSPITAL_COMMUNITY): Payer: BLUE CROSS/BLUE SHIELD

## 2017-07-23 ENCOUNTER — Encounter (HOSPITAL_COMMUNITY): Payer: Self-pay

## 2017-07-23 DIAGNOSIS — R29898 Other symptoms and signs involving the musculoskeletal system: Secondary | ICD-10-CM | POA: Diagnosis not present

## 2017-07-23 DIAGNOSIS — M25612 Stiffness of left shoulder, not elsewhere classified: Secondary | ICD-10-CM | POA: Diagnosis not present

## 2017-07-23 DIAGNOSIS — M25512 Pain in left shoulder: Secondary | ICD-10-CM | POA: Diagnosis not present

## 2017-07-23 NOTE — Therapy (Signed)
[place], [place], [address] Phone: [phone]   Fax:  [phone]  Occupational Therapy Treatment  Patient Details  Name: Carla Wise MRN: [id number] Date of Birth: 1966-08-07 Referring Provider: [name], MD  Encounter Date: 07/23/2017      OT End of Session - 07/23/17 1006    Visit Number 27   Number of Visits 30   Date for OT Re-Evaluation 08/09/17   Authorization Type BCBS    Authorization Time Period 30 visit limit with 0 used this year.   Authorization - Visit Number 27   Authorization - Number of Visits 30   OT Start Time 202 [id number] [id number]   OT Stop Time 0945   OT Time Calculation (min) 42 min   Activity Tolerance Patient tolerated treatment well   Behavior During Therapy WFL for tasks assessed/performed      Past Medical History:  Diagnosis Date  . Constipation   . Gastric adenoma 02/2017  . PONV (postoperative nausea and vomiting)     Past Surgical History:  Procedure Laterality Date  . ESOPHAGOGASTRODUODENOSCOPY N/A 03/01/2017   Dr. Oneida Alar: benign-appearing peptic stricture s/p dilation, gastric polyp (tubular adenoma), gastritis, duodenal diverticulm  . EXAM UNDER ANESTHESIA WITH MANIPULATION OF SHOULDER Left 06/06/2017   Procedure: EXAM UNDER ANESTHESIA WITH MANIPULATION OF SHOULDER;  Surgeon: Carole Civil, MD;  Location: [facility];  Service: Orthopedics;  Laterality: Left;  full relaxation  . FOOT SURGERY Left 2003   hammer toe- great toe  . KNEE ARTHROSCOPY W/ MENISCECTOMY     left knee  . SAVORY DILATION N/A 03/01/2017   Procedure: SAVORY DILATION;  Surgeon: Danie Binder, MD;  Location: [facility];  Service: Endoscopy;  Laterality: N/A;    There were no vitals filed for this visit.      Subjective Assessment - 07/23/17 0922    Subjective  S: I'm still doing all my stretches at home.    Currently in Pain? Yes   Pain Score 3    Pain Location Shoulder   Pain Orientation  Left   Pain Descriptors / Indicators Constant;Sore   Pain Type Acute pain            [facility] OT Assessment - 07/23/17 0924      Assessment   Diagnosis Left shoulder adhesive capsulitis     Precautions   Precautions None                  OT Treatments/Exercises (OP) - 07/23/17 0924      Exercises   Exercises Shoulder     Shoulder Exercises: Supine   Protraction PROM;10 reps;Strengthening;15 reps   Protraction Weight (lbs) 2   Horizontal ABduction PROM;10 reps;Strengthening;15 reps   Horizontal ABduction Weight (lbs) 2   External Rotation PROM;10 reps;Strengthening;15 reps   External Rotation Weight (lbs) 2   Internal Rotation PROM;10 reps;Strengthening;15 reps   Internal Rotation Weight (lbs) 2   Flexion PROM;10 reps;Strengthening;15 reps   Shoulder Flexion Weight (lbs) 2   ABduction PROM;10 reps;Strengthening;15 reps   Shoulder ABduction Weight (lbs) 2     Shoulder Exercises: Sidelying   External Rotation Strengthening;15 reps   External Rotation Weight (lbs) 2   Internal Rotation Strengthening;15 reps   Internal Rotation Weight (lbs) 2   Flexion Strengthening;15 reps   Flexion Weight (lbs) 2   ABduction Strengthening;15 reps   ABduction Weight (lbs) 2   Other Sidelying Exercises protraction 15X, 2#     Shoulder  Exercises: ROM/Strengthening   Cybex Press 2 plate;10 reps     Manual Therapy   Manual Therapy Myofascial release   Manual therapy comments manual therapy completed prior to exercises.    Myofascial Release Myofascial release and manual stretching completed to left upper arm, trapezius, and scapular region to decrease fascial restrictions and increase joint mobility in a pain free zone.                   OT Short Term Goals - 07/15/17 1232      OT SHORT TERM GOAL #1   Title Patient will be educated and independent with HEP to increase functional use of LUE during daily tasks.    Time 3   Period Weeks     OT SHORT TERM GOAL #2    Title Patient will increase P/ROM to [facility] to increase ability to get shirts on and off with less difficulty.   Baseline 05/16/17: She can donn and doff her shirts but with the use of compensatory strategies.   Time 3   Period Weeks     OT SHORT TERM GOAL #3   Title Patient will increase LUE strength to 3/5 to increase functional reaching ability to increase ability to brush hair.   Time 3   Period Weeks     OT SHORT TERM GOAL #4   Title Patient will decrease pain level in LUE to 5/10 during light household tasks.    Baseline 05/16/17: Her pain typically stays at a 6/10.   Time 3   Period Weeks     OT SHORT TERM GOAL #5   Title patient will decrease fascial restrictions to mod amount to increase ability to complete tasks at shoulder with less difficulty.   Time 3   Period Weeks           OT Long Term Goals - 07/15/17 1232      OT LONG TERM GOAL #1   Title Patient will return to highest level of independence when using LUE for all daily and work related tasks.    Time 6   Period Weeks   Status Partially Met     OT LONG TERM GOAL #2   Title Patient will decrease fascial restrictions to min amount or less in order to increase functional reaching ability.   Time 6   Period Weeks     OT LONG TERM GOAL #3   Title Patient will decrease pain level in LUE during work activities to a 3/10 or less.   Time 6   Period Weeks   Status Partially Met     OT LONG TERM GOAL #4   Title Patient will increase A/ROM to [facility] to increase ability to hook and unhook bra.   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   Time 6   Period Weeks               Plan - 07/23/17 1006    Clinical Impression Statement A: Added Cybex row to increase shoulder strengthening with Mod difficulty. VC for form and technique as needed through out session. Pt presents  with functional ROM during passive stretching.    Plan P: Add cybex row. Continue with functional reaching tasks.      Patient will benefit from skilled  therapeutic intervention in order to improve the following deficits and impairments:  Pain, Decreased range of motion, Decreased strength, Increased fascial restricitons, Impaired UE functional use  Visit Diagnosis: Acute pain of left shoulder  Other symptoms and signs involving the musculoskeletal system  Stiffness of left shoulder, not elsewhere classified    Problem List Patient Active Problem List   Diagnosis Date Noted  . Adhesive capsulitis of left shoulder   . Dyspepsia and disorder of function of stomach   . Constipation 02/15/2017  . Dysphagia 02/15/2017  . Encounter for screening colonoscopy 02/15/2017  . Arthritis of knee, degenerative 03/09/2014  . Knee pain 08/21/2011  . Stiffness of joint, not elsewhere classified, lower leg 08/21/2011  . Medial meniscus, posterior horn derangement 08/02/2011  . Torn meniscus 07/19/2011  . HIP PAIN 02/16/2009   Ailene Ravel, OTR/L,CBIS  [phone]  07/23/2017, 10:12 AM  [facility] [address] [place], [place], [address] Phone: [phone]   Fax:  [phone]  Name: NABA SNEED MRN: [id number] Date of Birth: 1966/11/01

## 2017-07-25 ENCOUNTER — Ambulatory Visit (HOSPITAL_COMMUNITY): Payer: BLUE CROSS/BLUE SHIELD

## 2017-07-25 DIAGNOSIS — M25612 Stiffness of left shoulder, not elsewhere classified: Secondary | ICD-10-CM | POA: Diagnosis not present

## 2017-07-25 DIAGNOSIS — R29898 Other symptoms and signs involving the musculoskeletal system: Secondary | ICD-10-CM

## 2017-07-25 DIAGNOSIS — M25512 Pain in left shoulder: Secondary | ICD-10-CM

## 2017-07-25 NOTE — Therapy (Signed)
[facility] [address] [place], [place], [address] Phone: [phone]   Fax:  [phone]  Occupational Therapy Treatment  Patient Details  Name: Carla Wise MRN: [id number] Date of Birth: [birth year] Referring Provider: Fuller Canada, MD  Encounter Date: 07/25/2017      OT End of Session - 07/25/17 1159    Visit Number 28   Number of Visits 30   Date for OT Re-Evaluation 08/09/17   Authorization Type BCBS    Authorization Time Period 30 visit limit with 0 used this year.   Authorization - Visit Number 28   Authorization - Number of Visits 30   OT Start Time 1115   OT Stop Time 1200   OT Time Calculation (min) 45 min   Activity Tolerance Patient tolerated treatment well   Behavior During Therapy WFL for tasks assessed/performed      Past Medical History:  Diagnosis Date  . Constipation   . Gastric adenoma 02/2017  . PONV (postoperative nausea and vomiting)     Past Surgical History:  Procedure Laterality Date  . ESOPHAGOGASTRODUODENOSCOPY N/A 03/01/2017   Dr. Darrick Penna: benign-appearing peptic stricture s/p dilation, gastric polyp (tubular adenoma), gastritis, duodenal diverticulm  . EXAM UNDER ANESTHESIA WITH MANIPULATION OF SHOULDER Left 06/06/2017   Procedure: EXAM UNDER ANESTHESIA WITH MANIPULATION OF SHOULDER;  Surgeon: Vickki Hearing, MD;  Location: [facility];  Service: Orthopedics;  Laterality: Left;  full relaxation  . FOOT SURGERY Left 2003   hammer toe- great toe  . KNEE ARTHROSCOPY W/ MENISCECTOMY     left knee  . SAVORY DILATION N/A 03/01/2017   Procedure: SAVORY DILATION;  Surgeon: West Bali, MD;  Location: [facility];  Service: Endoscopy;  Laterality: N/A;    There were no vitals filed for this visit.                    OT Treatments/Exercises (OP) - 07/25/17 1143      Exercises   Exercises Shoulder     Shoulder Exercises: Supine   Protraction PROM;Strengthening;10 reps   Protraction Weight (lbs) 3   Horizontal ABduction PROM;Strengthening;10 reps   Horizontal ABduction Weight (lbs) 3   External Rotation PROM;Strengthening;10 reps   External Rotation Weight (lbs) 3   Internal Rotation PROM;Strengthening;10 reps   Internal Rotation Weight (lbs) 3   Flexion PROM;Strengthening;10 reps   Shoulder Flexion Weight (lbs) 3   ABduction PROM;Strengthening;10 reps   Shoulder ABduction Weight (lbs) 3     Shoulder Exercises: Sidelying   External Rotation Strengthening;10 reps   External Rotation Weight (lbs) 3   Internal Rotation Strengthening;10 reps   Internal Rotation Weight (lbs) 3   Flexion Strengthening;15 reps   Flexion Weight (lbs) 2   ABduction Strengthening;10 reps   ABduction Weight (lbs) 3   Other Sidelying Exercises Protraction: 10X; 3#     Shoulder Exercises: Standing   Extension Theraband;12 reps   Theraband Level (Shoulder Extension) Level 2 (Red)   Row Theraband;12 reps   Theraband Level (Shoulder Row) Level 2 (Red)   Retraction Theraband;12 reps   Theraband Level (Shoulder Retraction) Level 2 (Red)     Shoulder Exercises: ROM/Strengthening   Cybex Press 2 plate;10 reps   Cybex Row 2 plate;10 reps     Manual Therapy   Manual Therapy Myofascial release   Manual therapy comments manual therapy completed prior to exercises.    Myofascial Release Myofascial release and manual stretching completed to left upper arm, trapezius,  and scapular region to decrease fascial restrictions and increase joint mobility in a pain free zone.                   OT Short Term Goals - 07/15/17 1232      OT SHORT TERM GOAL #1   Title Patient will be educated and independent with HEP to increase functional use of LUE during daily tasks.    Time 3   Period Weeks     OT SHORT TERM GOAL #2   Title Patient will increase P/ROM to [facility] to increase ability to get shirts on and off with less difficulty.   Baseline 05/16/17: She can donn and doff her shirts  but with the use of compensatory strategies.   Time 3   Period Weeks     OT SHORT TERM GOAL #3   Title Patient will increase LUE strength to 3/5 to increase functional reaching ability to increase ability to brush hair.   Time 3   Period Weeks     OT SHORT TERM GOAL #4   Title Patient will decrease pain level in LUE to 5/10 during light household tasks.    Baseline 05/16/17: Her pain typically stays at a 6/10.   Time 3   Period Weeks     OT SHORT TERM GOAL #5   Title patient will decrease fascial restrictions to mod amount to increase ability to complete tasks at shoulder with less difficulty.   Time 3   Period Weeks           OT Long Term Goals - 07/15/17 1232      OT LONG TERM GOAL #1   Title Patient will return to highest level of independence when using LUE for all daily and work related tasks.    Time 6   Period Weeks   Status Partially Met     OT LONG TERM GOAL #2   Title Patient will decrease fascial restrictions to min amount or less in order to increase functional reaching ability.   Time 6   Period Weeks     OT LONG TERM GOAL #3   Title Patient will decrease pain level in LUE during work activities to a 3/10 or less.   Time 6   Period Weeks   Status Partially Met     OT LONG TERM GOAL #4   Title Patient will increase A/ROM to [facility] to increase ability to hook and unhook bra.   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   Time 6   Period Weeks               Plan - 07/25/17 1239    Clinical Impression Statement A: Added Cybex row and press during session to continue working on shoulder and scapular strengthening. Pt did not struggle with cybex press as much as last session. VC for form and technique as needed.    Plan P: Continue with scapular and shoulder strengthening. Complete all shoulder strengthening with  red theraband.       Patient will benefit from skilled therapeutic intervention in order to improve the following deficits and impairments:  Pain, Decreased range of motion, Decreased strength, Increased fascial restricitons, Impaired UE functional use  Visit Diagnosis: Acute pain of left shoulder  Other  symptoms and signs involving the musculoskeletal system  Stiffness of left shoulder, not elsewhere classified    Problem List Patient Active Problem List   Diagnosis Date Noted  . Adhesive capsulitis of left shoulder   . Dyspepsia and disorder of function of stomach   . Constipation 02/15/2017  . Dysphagia 02/15/2017  . Encounter for screening colonoscopy 02/15/2017  . Arthritis of knee, degenerative 03/09/2014  . Knee pain 08/21/2011  . Stiffness of joint, not elsewhere classified, lower leg 08/21/2011  . Medial meniscus, posterior horn derangement 08/02/2011  . Torn meniscus 07/19/2011  . HIP PAIN 02/16/2009   Ailene Ravel, OTR/L,CBIS  [phone]  07/25/2017, 12:47 PM  [place] [address] [place], [place], [address] Phone: [phone]   Fax:  [phone]  Name: CHACE BISCH MRN: [id number] Date of Birth: 1966/10/12

## 2017-07-29 ENCOUNTER — Encounter: Payer: Self-pay | Admitting: Family Medicine

## 2017-07-29 ENCOUNTER — Ambulatory Visit (INDEPENDENT_AMBULATORY_CARE_PROVIDER_SITE_OTHER): Payer: BLUE CROSS/BLUE SHIELD | Admitting: Family Medicine

## 2017-07-29 VITALS — BP 130/86 | Ht 61.0 in | Wt 102.0 lb

## 2017-07-29 DIAGNOSIS — R300 Dysuria: Secondary | ICD-10-CM | POA: Diagnosis not present

## 2017-07-29 DIAGNOSIS — M545 Low back pain, unspecified: Secondary | ICD-10-CM

## 2017-07-29 LAB — POCT URINALYSIS DIPSTICK
Blood, UA: NEGATIVE
Leukocytes, UA: NEGATIVE
Spec Grav, UA: <=1.005 — AB (ref 1.010–1.025)
pH, UA: 6 (ref 5.0–8.0)

## 2017-07-29 NOTE — Patient Instructions (Signed)

## 2017-07-29 NOTE — Progress Notes (Signed)
   Subjective:    Patient ID: Carla Wise, female    DOB: 1966-04-10, 51 y.o.   MRN: [id number]  Urinary Tract Infection   This is a new problem. The current episode started in the past 7 days.  Complaining of Right lower back pain.She has not used anything for this. Results for orders placed or performed in visit on 07/29/17  POCT urinalysis dipstick  Result Value Ref Range   Color, UA     Clarity, UA     Glucose, UA     Bilirubin, UA     Ketones, UA     Spec Grav, UA <=1.005 (A) 1.010 - 1.025   Blood, UA Negative    pH, UA 6.0 5.0 - 8.0   Protein, UA     Urobilinogen, UA  0.2 or 1.0 E.U./dL   Nitrite, UA     Leukocytes, UA Negative Negative   Stretch helps  no trigger For 1 week Lumbar pain discomfort on right lower back does not radiate down the legs no numbness or tingling denies any chest tightness pressure pain or shortness of breath. Denies dysuria or urinary frequency  Review of Systems  Constitutional: Negative for activity change, fatigue and fever.  HENT: Negative for congestion.   Respiratory: Negative for cough, chest tightness and shortness of breath.   Cardiovascular: Negative for chest pain and leg swelling.  Gastrointestinal: Negative for abdominal pain.  Skin: Negative for color change.  Neurological: Negative for headaches.  Psychiatric/Behavioral: Negative for behavioral problems.   Please see above    Objective:   Physical Exam  Constitutional: She appears well-nourished. No distress.  HENT:  Head: Normocephalic.  Right Ear: External ear normal.  Left Ear: External ear normal.  Eyes: Right eye exhibits no discharge. Left eye exhibits no discharge.  Neck: No tracheal deviation present.  Cardiovascular: Normal rate, regular rhythm and normal heart sounds.   No murmur heard. Pulmonary/Chest: Effort normal and breath sounds normal. No respiratory distress. She has no wheezes. She has no rales.  Musculoskeletal: She exhibits no edema.    Lymphadenopathy:    She has no cervical adenopathy.  Neurological: She is alert.  Psychiatric: Her behavior is normal.  Vitals reviewed.  Lower back subjected discomfort near the lumbar region negative straight leg raise abdomen is soft UA negative       Assessment & Plan:  Musculoskeletal lumbar pain stretching were shown anti-inflammatory when necessary follow-up if progressive troubles no need for any antibiotics currently do not feel this is urinary tract infection should gradually get better over the next week

## 2017-07-30 ENCOUNTER — Encounter (HOSPITAL_COMMUNITY): Payer: Self-pay

## 2017-07-30 ENCOUNTER — Ambulatory Visit (HOSPITAL_COMMUNITY): Payer: BLUE CROSS/BLUE SHIELD

## 2017-07-30 DIAGNOSIS — M25512 Pain in left shoulder: Secondary | ICD-10-CM | POA: Diagnosis not present

## 2017-07-30 DIAGNOSIS — M25612 Stiffness of left shoulder, not elsewhere classified: Secondary | ICD-10-CM

## 2017-07-30 DIAGNOSIS — R29898 Other symptoms and signs involving the musculoskeletal system: Secondary | ICD-10-CM

## 2017-07-30 NOTE — Patient Instructions (Signed)
Complete the following exercises 2-3 times a day.  Doorway Stretch  Place each hand opposite each other on the doorway. (You can change where you feel the stretch by moving arms higher or lower.) Step through with one foot and bend front knee until a stretch is felt and hold. Step through with the opposite foot on the next rep. Hold for __30 seconds - 1 minute___ seconds. Repeat __2__times.        Internal Rotation Across Back  AAROM Shoulder Internal Rotation  Holding on to a belt or towel with your affected arm behind your back, pull the opposite end up as pictured moving your arm into more internal rotation behind your back. Do this exercise in a relatively pain-free range of motion. Hold for 30 seconds-1 minute. Repeat 2 times.      Wall Flexion  Slide your arm up the wall or door frame until a stretch is felt in your shoulder . Hold for 30 seconds-1 minute. Complete 2 times     Shoulder Abduction Stretch  Stand side ways by a wall with affected up on wall. Gently step in toward wall to feel stretch. Hold for 30 seconds- 1 minute. Complete 2 times.       *Complete the band exercises on Tuesday-Thursday-Saturday. 10-15 repetitions each. 1-2 times a day. PNF Strengthening: Resisted Strengthening: Resisted Abduction   Hold tubing with right arm across body. Pull up and away from side. Move through pain-free range of motion. Repeat ____ times per set. Do ____ sets per session. Do ____ sessions per day.  http://[email or web address].us/826  Strengthening: Resisted Flexion   Hold tubing with left arm at side. Pull forward and up. Move shoulder through pain-free range of motion. Repeat ____ times per set. Do ____ sets per session. Do ____ sessions per day.  Strengthening: Resisted Extension   Hold tubing in right hand, arm forward. Pull arm back, elbow straight. Repeat ____ times per set. Do ____ sets per session. Do ____ sessions per day.  http://orth.exer.us/832    Copyright  VHI. All rights reserved.   Strengthening: Resisted External Rotation   Hold tubing in right hand, elbow at side and forearm across body. Rotate forearm out. Repeat ____ times per set. Do ____ sets per session. Do ____ sessions per day.  http://orth.exer.us/828   Copyright  VHI. All rights reserved.    http://orth.exer.us/824   Copyright  VHI. All rights reserved.   Strengthening: Resisted Horizontal Abduction   Hold tubing in right hand, elbow straight, arm in, parallel to floor. Pull arm out from side through pain-free range. Repeat ____ times per set. Do ____ sets per session. Do ____ sessions per day.  http://orth.exer.us/836   Copyright  VHI. All rights reserved.  (Home) Extension: Isometric / Bilateral Arm Retraction - Sitting   Facing anchor, hold hands and elbow at shoulder height, with elbow bent.  Pull arms back to squeeze shoulder blades together.   Copyright  VHI. All rights reserved.   (Home) Retraction: Row - Bilateral (Anchor)   Facing anchor, arms reaching forward, pull hands toward stomach, keeping elbows bent and at your sides and pinching shoulder blades together.   *Complete the strengthening exercises on Monday-Wednesday-Friday. 10-15 repetitions each. 1-2 times a day. Chest Press  Sit in a chair or stand with your head up and back straight. Start with your elbows bent holding the weights at your chest. Push the weights straight out in front of you until your arms are straight. Pull the weights back slowly to  the starting position.     Standing Overhead Press  Standing with shoulder blades squeezed together, bring your arms up to shoulder height, elbows bent at 90 degrees. Straighten arms up overhead, bringing the weights together. Bend elbows, lowering the weights. Repeat.    EXTERNAL ROTATION - ER 90 90 - FREE WEIGHTS  Begin by holding free weights with your arm up at 90 degrees away from your side and elbow bent to 90  degrees. Your forearm should be directed forward in the beginning position as shown.   Next, roll your shoulder back so that your forearm is directed upward. Then, return to original position.   Maintain your shoulder blade in a retracted and downward position the entire time.       FREE WEIGHT - ABDUCTION  While holding a weight and elbow straight, bring up your arm to the side.

## 2017-07-30 NOTE — Therapy (Signed)
[place], [place], [address] Phone: [phone]   Fax:  [phone]  Occupational Therapy Treatment  Patient Details  Name: Carla Wise MRN: [id number] Date of Birth: 07/05/1966 Referring Provider: [name], MD  Encounter Date: 07/30/2017      OT End of Session - 07/30/17 1500    Visit Number 29   Number of Visits 30   Date for OT Re-Evaluation 08/09/17   Authorization Type BCBS    Authorization Time Period 30 visit limit with 0 used this year.   Authorization - Visit Number 29   Authorization - Number of Visits 30   OT Start Time 9983   OT Stop Time 1438   OT Time Calculation (min) 46 min   Activity Tolerance Patient tolerated treatment well   Behavior During Therapy WFL for tasks assessed/performed      Past Medical History:  Diagnosis Date  . Constipation   . Gastric adenoma 02/2017  . PONV (postoperative nausea and vomiting)     Past Surgical History:  Procedure Laterality Date  . ESOPHAGOGASTRODUODENOSCOPY N/A 03/01/2017   Dr. Oneida Alar: benign-appearing peptic stricture s/p dilation, gastric polyp (tubular adenoma), gastritis, duodenal diverticulm  . EXAM UNDER ANESTHESIA WITH MANIPULATION OF SHOULDER Left 06/06/2017   Procedure: EXAM UNDER ANESTHESIA WITH MANIPULATION OF SHOULDER;  Surgeon: Carole Civil, MD;  Location: [facility];  Service: Orthopedics;  Laterality: Left;  full relaxation  . FOOT SURGERY Left 2003   hammer toe- great toe  . KNEE ARTHROSCOPY W/ MENISCECTOMY     left knee  . SAVORY DILATION N/A 03/01/2017   Procedure: SAVORY DILATION;  Surgeon: Danie Binder, MD;  Location: [facility];  Service: Endoscopy;  Laterality: N/A;    There were no vitals filed for this visit.      Subjective Assessment - 07/30/17 1456    Subjective  S: I feel like this bump isn't going away on my arm.   Currently in Pain? Yes   Pain Score 2    Pain Location Shoulder   Pain  Orientation Left   Pain Descriptors / Indicators Aching;Sore   Pain Type Acute pain   Pain Radiating Towards N/A   Pain Onset More than a month ago   Pain Frequency Constant   Aggravating Factors  using it and stretching   Pain Relieving Factors ice, heat   Effect of Pain on Daily Activities severe. Nights are worse   Multiple Pain Sites No            [facility] OT Assessment - 07/30/17 1457      Assessment   Diagnosis Left shoulder adhesive capsulitis     Precautions   Precautions None                  OT Treatments/Exercises (OP) - 07/30/17 1457      Exercises   Exercises Shoulder     Shoulder Exercises: Supine   Protraction PROM;Strengthening;10 reps   Protraction Weight (lbs) 3   Horizontal ABduction PROM;Strengthening;10 reps   Horizontal ABduction Weight (lbs) 3   External Rotation PROM;Strengthening;10 reps   External Rotation Weight (lbs) 3   Internal Rotation PROM;Strengthening;10 reps   Internal Rotation Weight (lbs) 3   Flexion PROM;Strengthening;10 reps   Shoulder Flexion Weight (lbs) 3   ABduction PROM;Strengthening;10 reps   Shoulder ABduction Weight (lbs) 3     Shoulder Exercises: Standing   Horizontal ABduction Theraband;10 reps   Theraband Level (Shoulder  Horizontal ABduction) Level 2 (Red)   External Rotation Theraband;10 reps   Theraband Level (Shoulder External Rotation) Level 2 (Red)   ABduction Theraband;10 reps   Theraband Level (Shoulder ABduction) Level 2 (Red)   Extension Theraband;10 reps   Theraband Level (Shoulder Extension) Level 2 (Red)   Row Theraband;10 reps   Theraband Level (Shoulder Row) Level 2 (Red)   Retraction Theraband;10 reps   Theraband Level (Shoulder Retraction) Level 2 (Red)     Manual Therapy   Manual Therapy Myofascial release   Manual therapy comments manual therapy completed prior to exercises.    Myofascial Release Myofascial release and manual stretching completed to left upper arm, trapezius, and  scapular region to decrease fascial restrictions and increase joint mobility in a pain free zone.                 OT Education - 07/30/17 1459    Education provided Yes   Education Details Pt given updated HEP to complete prior to end of therapy. Shoulder stretches, shoulder strengthening with weight, and theraband strengthening.    Person(s) Educated Patient   Methods Explanation;Demonstration;Verbal cues;Handout   Comprehension Returned demonstration;Verbalized understanding          OT Short Term Goals - 07/15/17 1232      OT SHORT TERM GOAL #1   Title Patient will be educated and independent with HEP to increase functional use of LUE during daily tasks.    Time 3   Period Weeks     OT SHORT TERM GOAL #2   Title Patient will increase P/ROM to [facility] to increase ability to get shirts on and off with less difficulty.   Baseline 05/16/17: She can donn and doff her shirts but with the use of compensatory strategies.   Time 3   Period Weeks     OT SHORT TERM GOAL #3   Title Patient will increase LUE strength to 3/5 to increase functional reaching ability to increase ability to brush hair.   Time 3   Period Weeks     OT SHORT TERM GOAL #4   Title Patient will decrease pain level in LUE to 5/10 during light household tasks.    Baseline 05/16/17: Her pain typically stays at a 6/10.   Time 3   Period Weeks     OT SHORT TERM GOAL #5   Title patient will decrease fascial restrictions to mod amount to increase ability to complete tasks at shoulder with less difficulty.   Time 3   Period Weeks           OT Long Term Goals - 07/15/17 1232      OT LONG TERM GOAL #1   Title Patient will return to highest level of independence when using LUE for all daily and work related tasks.    Time 6   Period Weeks   Status Partially Met     OT LONG TERM GOAL #2   Title Patient will decrease fascial restrictions to min amount or less in order to increase functional reaching ability.    Time 6   Period Weeks     OT LONG TERM GOAL #3   Title Patient will decrease pain level in LUE during work activities to a 3/10 or less.   Time 6   Period Weeks   Status Partially Met     OT LONG TERM GOAL #4   Title Patient will increase A/ROM to [facility] to increase ability to hook and unhook bra.  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   Time 6   Period Weeks               Plan - 07/30/17 1501    Clinical Impression Statement A: sesson focused on shoulder strengthening and ROM needed to complete daily tasks. HEP was updated and reviewed. Pt is to practice and review at home and come to last session with questions and final review. VC for form and technique.    Plan P: Follow up on HEP and review. Reassess and discharge.      Patient will benefit from skilled therapeutic intervention in order to improve the following deficits and impairments:  Pain, Decreased range of motion, Decreased strength, Increased fascial restricitons, Impaired UE functional use  Visit Diagnosis: Acute pain of left shoulder  Other symptoms and signs involving the musculoskeletal system  Stiffness of left shoulder, not elsewhere classified    Problem List Patient Active Problem List   Diagnosis Date Noted  . Adhesive capsulitis of left shoulder   . Dyspepsia and disorder of function of stomach   . Constipation 02/15/2017  . Dysphagia 02/15/2017  . Encounter for screening colonoscopy 02/15/2017  . Arthritis of knee, degenerative 03/09/2014  . Knee pain 08/21/2011  . Stiffness of joint, not elsewhere classified, lower leg 08/21/2011  . Medial meniscus, posterior horn derangement 08/02/2011  . Torn meniscus 07/19/2011  . HIP PAIN 02/16/2009   Ailene Ravel, OTR/L,CBIS  [phone]  07/30/2017, 3:12 PM  [facility] [address] [place], [place], [address] Phone: [phone]   Fax:  [phone]  Name: Carla Wise MRN: [id number] Date of Birth: 05-23-1966

## 2017-08-01 ENCOUNTER — Ambulatory Visit (HOSPITAL_COMMUNITY): Payer: BLUE CROSS/BLUE SHIELD

## 2017-08-01 ENCOUNTER — Encounter (HOSPITAL_COMMUNITY): Payer: Self-pay

## 2017-08-01 DIAGNOSIS — M25612 Stiffness of left shoulder, not elsewhere classified: Secondary | ICD-10-CM | POA: Diagnosis not present

## 2017-08-01 DIAGNOSIS — M25512 Pain in left shoulder: Secondary | ICD-10-CM | POA: Diagnosis not present

## 2017-08-01 DIAGNOSIS — R29898 Other symptoms and signs involving the musculoskeletal system: Secondary | ICD-10-CM

## 2017-08-01 NOTE — Therapy (Signed)
[place], [place], [address] Phone: [phone]   Fax:  [phone]  Occupational Therapy Treatment And reassessment Patient Details  Name: Carla Wise MRN: [id number] Date of Birth: [birth year] Referring Provider: [name], MD  Encounter Date: 08/01/2017      OT End of Session - 08/01/17 1110    Visit Number 30   Number of Visits 30   Date for OT Re-Evaluation --   Authorization Type BCBS    Authorization Time Period 30 visit limit with 0 used this year.   Authorization - Visit Number 30   Authorization - Number of Visits 30   OT Start Time [phone]  reassessment and discharge   OT Stop Time 1032   OT Time Calculation (min) 42 min   Activity Tolerance Patient tolerated treatment well   Behavior During Therapy WFL for tasks assessed/performed      Past Medical History:  Diagnosis Date  . Constipation   . Gastric adenoma 02/2017  . PONV (postoperative nausea and vomiting)     Past Surgical History:  Procedure Laterality Date  . ESOPHAGOGASTRODUODENOSCOPY N/A 03/01/2017   Dr. Oneida Alar: benign-appearing peptic stricture s/p dilation, gastric polyp (tubular adenoma), gastritis, duodenal diverticulm  . EXAM UNDER ANESTHESIA WITH MANIPULATION OF SHOULDER Left 06/06/2017   Procedure: EXAM UNDER ANESTHESIA WITH MANIPULATION OF SHOULDER;  Surgeon: Carole Civil, MD;  Location: [facility];  Service: Orthopedics;  Laterality: Left;  full relaxation  . FOOT SURGERY Left 2003   hammer toe- great toe  . KNEE ARTHROSCOPY W/ MENISCECTOMY     left knee  . SAVORY DILATION N/A 03/01/2017   Procedure: SAVORY DILATION;  Surgeon: Danie Binder, MD;  Location: [facility];  Service: Endoscopy;  Laterality: N/A;    There were no vitals filed for this visit.      Subjective Assessment - 08/01/17 1018    Subjective  S: I had a bad night. I may have worked it too hard.   Currently in Pain? Yes   Pain Score 3     Pain Location Shoulder   Pain Orientation Left   Pain Descriptors / Indicators Aching;Sore   Pain Type Acute pain            [facility] OT Assessment - 08/01/17 0954      Assessment   Diagnosis Left shoulder adhesive capsulitis     Precautions   Precautions None     ROM / Strength   AROM / PROM / Strength AROM;Strength;PROM     AROM   Overall AROM Comments Assessed seated. IR/er abducted.   AROM Assessment Site Shoulder   Right/Left Shoulder Left   Left Shoulder Flexion 130 Degrees  previous: 130   Left Shoulder ABduction 140 Degrees  previous; 125   Left Shoulder Internal Rotation 79 Degrees  previous: 75   Left Shoulder External Rotation 62 Degrees  previous: 52     PROM   Overall PROM Comments Assessed supine. IR/er adducted   PROM Assessment Site Shoulder   Right/Left Shoulder Left   Left Shoulder Flexion 152 Degrees  previous: 142   Left Shoulder ABduction 180 Degrees  previous: 149   Left Shoulder Internal Rotation 90 Degrees  previous: same   Left Shoulder External Rotation 80 Degrees  previous: 60     Strength   Overall Strength Comments Assessed seated. IR/er abducted   Strength Assessment Site Shoulder   Right/Left Shoulder Left   Left Shoulder Flexion 5/5  previous: 4+/5   Left Shoulder ABduction 4+/5  previous: 4/5   Left Shoulder Internal Rotation 4+/5  previous: 4/5   Left Shoulder External Rotation 4+/5  previous: 4/5                  OT Treatments/Exercises (OP) - 08/01/17 1027      Exercises   Exercises Shoulder     Shoulder Exercises: Supine   Protraction PROM;5 reps   Horizontal ABduction PROM;5 reps   External Rotation PROM;5 reps   Internal Rotation PROM;5 reps   Flexion PROM;5 reps   ABduction PROM;5 reps     Shoulder Exercises: Standing   Horizontal ABduction Theraband;10 reps   Theraband Level (Shoulder Horizontal ABduction) Level 2 (Red)   External Rotation Theraband;10 reps   Theraband Level (Shoulder  External Rotation) Level 2 (Red)   Flexion Theraband;10 reps   Theraband Level (Shoulder Flexion) Level 2 (Red)   ABduction Theraband;10 reps   Theraband Level (Shoulder ABduction) Level 2 (Red)   Extension Theraband;10 reps   Theraband Level (Shoulder Extension) Level 2 (Red)     Manual Therapy   Manual Therapy Myofascial release   Manual therapy comments manual therapy completed prior to exercises.    Myofascial Release Myofascial release and manual stretching completed to left upper arm, trapezius, and scapular region to decrease fascial restrictions and increase joint mobility in a pain free zone.                 OT Education - 08/01/17 1109    Education provided Yes   Education Details Discussed progress in therapy. Reviewed HEP and made adjustments as needed.    Person(s) Educated Patient   Methods Demonstration;Explanation;Verbal cues   Comprehension Verbalized understanding;Returned demonstration          OT Short Term Goals - 08/01/17 1015      OT SHORT TERM GOAL #1   Title Patient will be educated and independent with HEP to increase functional use of LUE during daily tasks.    Time 3   Period Weeks     OT SHORT TERM GOAL #2   Title Patient will increase P/ROM to [facility] to increase ability to get shirts on and off with less difficulty.   Baseline 05/16/17: She can donn and doff her shirts but with the use of compensatory strategies.   Time 3   Period Weeks     OT SHORT TERM GOAL #3   Title Patient will increase LUE strength to 3/5 to increase functional reaching ability to increase ability to brush hair.   Time 3   Period Weeks     OT SHORT TERM GOAL #4   Title Patient will decrease pain level in LUE to 5/10 during light household tasks.    Baseline 05/16/17: Her pain typically stays at a 6/10.   Time 3   Period Weeks     OT SHORT TERM GOAL #5   Title patient will decrease fascial restrictions to mod amount to increase ability to complete tasks at  shoulder with less difficulty.   Time 3   Period Weeks           OT Long Term Goals - 08/01/17 1015      OT LONG TERM GOAL #1   Title Patient will return to highest level of independence when using LUE for all daily and work related tasks.    Time 6   Period Weeks   Status Achieved     OT  LONG TERM GOAL #2   Title Patient will decrease fascial restrictions to min amount or less in order to increase functional reaching ability.   Time 6   Period Weeks     OT LONG TERM GOAL #3   Title Patient will decrease pain level in LUE during work activities to a 3/10 or less.   Time 6   Period Weeks   Status Achieved     OT LONG TERM GOAL #4   Title Patient will increase A/ROM to [facility] to increase ability to hook and unhook bra.   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   Time 6   Period Weeks               Plan - 08/01/17 1111    Clinical Impression Statement A: Reassessment completed this date as this is visit 14/30 that insurance will cover. patient has made significant improvement with passive and active ROM measurements. All strength shoulder ranges have improved as well.  Patient has met all therapy goals. Her ROM and use of her left arm is functional although not in normal ranges. Pt is able to independently complete HEP at home composed of shoulder stretches, theraband and free weight strengthening. All exercises were reviewed today and questions were addressed.    Plan P: D/C from therapy with HEP.      Patient will benefit from skilled therapeutic intervention in order to improve the following deficits and impairments:  Pain, Decreased range of motion, Decreased strength, Increased fascial restricitons, Impaired UE functional use  Visit Diagnosis: Acute pain of left shoulder  Other symptoms and signs involving the musculoskeletal  system  Stiffness of left shoulder, not elsewhere classified    Problem List Patient Active Problem List   Diagnosis Date Noted  . Adhesive capsulitis of left shoulder   . Dyspepsia and disorder of function of stomach   . Constipation 02/15/2017  . Dysphagia 02/15/2017  . Encounter for screening colonoscopy 02/15/2017  . Arthritis of knee, degenerative 03/09/2014  . Knee pain 08/21/2011  . Stiffness of joint, not elsewhere classified, lower leg 08/21/2011  . Medial meniscus, posterior horn derangement 08/02/2011  . Torn meniscus 07/19/2011  . HIP PAIN 02/16/2009    OCCUPATIONAL THERAPY DISCHARGE SUMMARY  Visits from Start of Care: 30  Current functional level related to goals / functional outcomes: See above   Remaining deficits: See above   Education / Equipment: See above Plan: Patient agrees to discharge.  Patient goals were met. Patient is being discharged due to meeting the stated rehab goals.  ?????         Ailene Ravel, OTR/L,CBIS  [phone]  08/01/2017, 11:14 AM  [place], [place], [address] Phone: [phone]   Fax:  [phone]  Name: Carla Wise MRN: [id number] Date of Birth: [birth year]

## 2017-08-05 ENCOUNTER — Encounter: Payer: Self-pay | Admitting: Orthopedic Surgery

## 2017-08-05 ENCOUNTER — Ambulatory Visit (INDEPENDENT_AMBULATORY_CARE_PROVIDER_SITE_OTHER): Payer: BLUE CROSS/BLUE SHIELD | Admitting: Orthopedic Surgery

## 2017-08-05 DIAGNOSIS — M7502 Adhesive capsulitis of left shoulder: Secondary | ICD-10-CM

## 2017-08-05 DIAGNOSIS — Z4889 Encounter for other specified surgical aftercare: Secondary | ICD-10-CM

## 2017-08-05 MED ORDER — CYCLOBENZAPRINE HCL 10 MG PO TABS: 10.0000 mg | ORAL_TABLET | Freq: Three times a day (TID) | ORAL | 1 refills | Status: DC | PRN

## 2017-08-05 NOTE — Progress Notes (Signed)
Postop status post manipulation under anesthesia for adhesive capsulitis left shoulder date of surgery June 28  This is postop day #60  She has finished formal physical therapy current range of motion 130 of flexion 45 external rotation 100 abduction  Pain much better and well-controlled with ibuprofen  Recommend muscle relaxer as needed follow-up in 6 weeks continue home exercise program

## 2017-08-06 ENCOUNTER — Encounter (HOSPITAL_COMMUNITY): Payer: Self-pay

## 2017-09-13 ENCOUNTER — Ambulatory Visit (INDEPENDENT_AMBULATORY_CARE_PROVIDER_SITE_OTHER): Payer: BLUE CROSS/BLUE SHIELD | Admitting: Nurse Practitioner

## 2017-09-13 ENCOUNTER — Encounter: Payer: Self-pay | Admitting: Nurse Practitioner

## 2017-09-13 VITALS — BP 100/72 | Temp 98.7°F | Ht 61.0 in | Wt 101.0 lb

## 2017-09-13 DIAGNOSIS — J300 Vasomotor rhinitis: Secondary | ICD-10-CM | POA: Diagnosis not present

## 2017-09-13 MED ORDER — DOXYCYCLINE HYCLATE 100 MG PO TABS: 100.0000 mg | ORAL_TABLET | Freq: Two times a day (BID) | ORAL | 0 refills | Status: DC

## 2017-09-13 NOTE — Patient Instructions (Signed)
[facility] chiropractor

## 2017-09-15 ENCOUNTER — Encounter: Payer: Self-pay | Admitting: Nurse Practitioner

## 2017-09-15 NOTE — Progress Notes (Signed)
Subjective:  Presents for complaints of ear pain sore throat and congested cough for the past several days. No fever. Irritated throat. Runny nose. Cough worse with lying down. Occasionally producing clear mucus. Left ear pain. No headache or wheezing. Has cut down her smoking to less than one half pack per day. Also having some discomfort going into the left side of the neck at times.  Objective:   BP 100/72   Temp 98.7 F (37.1 C) (Oral)   Ht 5\' 1"  (1.549 m)   Wt 101 lb 0.8 oz (45.8 kg)   BMI 19.09 kg/m  NAD. Alert, oriented. TMs clear effusion, no erythema. Posterior pharynx injected with PND noted. Neck supple with mild soft anterior adenopathy. Lungs clear. Heart regular rate rhythm. Slight lymphadenopathy noted on the left side with extremely tight tender lateral muscles on both sides of the neck. Normal ROM of the neck.  Assessment:   Problem List Items Addressed This Visit    None    Visit Diagnoses    Vasomotor rhinitis    -  Primary       Plan:   Meds ordered this encounter  Medications  . doxycycline (VIBRA-TABS) 100 MG tablet    Sig: Take 1 tablet (100 mg total) by mouth 2 (two) times daily.    Dispense:  20 tablet    Refill:  0    Order Specific Question:   Supervising Provider    Answer:   Maggie Font   Given written Rx for Doxycyline to have on hand for the weekend in case symptoms worsen. OTC meds as directed. Call back if worsens or persists. Recommend massage therapy for neck pain.

## 2017-09-16 ENCOUNTER — Encounter: Payer: Self-pay | Admitting: Orthopedic Surgery

## 2017-09-16 ENCOUNTER — Ambulatory Visit (INDEPENDENT_AMBULATORY_CARE_PROVIDER_SITE_OTHER): Payer: BLUE CROSS/BLUE SHIELD | Admitting: Orthopedic Surgery

## 2017-09-16 VITALS — BP 145/94 | HR 40 | Ht 61.0 in | Wt 101.0 lb

## 2017-09-16 DIAGNOSIS — M7551 Bursitis of right shoulder: Secondary | ICD-10-CM | POA: Diagnosis not present

## 2017-09-16 DIAGNOSIS — M7502 Adhesive capsulitis of left shoulder: Secondary | ICD-10-CM | POA: Diagnosis not present

## 2017-09-16 NOTE — Progress Notes (Signed)
Chief Complaint  Patient presents with  . Follow-up    Recheck on left shoulder, DOS 06-06-17.    Status post manipulation under anesthesia for adhesive capsulitis left shoulder  Patient has 155 of forward elevation However, she complains of pain in the right shoulder stiffening but she can flex that 1 170 with painful bursitis and impingement  I recommended injection subacromial space which she agreed to   Procedure note the subacromial injection shoulder RIGHT  Verbal consent was obtained to inject the  RIGHT   Shoulder  Timeout was completed to confirm the injection site is a subacromial space of the  RIGHT  shoulder   Medication used Depo-Medrol 40 mg and lidocaine 1% 3 cc  Anesthesia was provided by ethyl chloride  The injection was performed in the RIGHT  posterior subacromial space. After pinning the skin with alcohol and anesthetized the skin with ethyl chloride the subacromial space was injected using a 20-gauge needle. There were no complications  Sterile dressing was applied.  Continue stretching both shoulders follow-up as needed

## 2017-09-16 NOTE — Patient Instructions (Signed)
Stretching exercises both shoulders

## 2017-10-07 ENCOUNTER — Other Ambulatory Visit: Payer: Self-pay | Admitting: Orthopedic Surgery

## 2017-12-23 ENCOUNTER — Telehealth: Payer: Self-pay | Admitting: Orthopedic Surgery

## 2017-12-23 NOTE — Telephone Encounter (Signed)
Patient called about her right shoulder, last seen 09/16/17; states would like to proceed with physical therapy at [facility] out-patient rehab. Asking if a new visit is needed for therapy to be ordered.  If so, she can bring in her 2019 [facility] insurance card. Please call at ph#[phone].

## 2017-12-24 NOTE — Telephone Encounter (Signed)
Yes, she needs to be seen again, Dr Aline Brochure did not order therapy, he told her Continue stretching both shoulders follow-up as needed

## 2017-12-24 NOTE — Telephone Encounter (Signed)
Called back to patient; appointment scheduled. °

## 2018-01-08 ENCOUNTER — Encounter: Payer: Self-pay | Admitting: Orthopedic Surgery

## 2018-01-08 ENCOUNTER — Ambulatory Visit: Payer: BLUE CROSS/BLUE SHIELD | Admitting: Orthopedic Surgery

## 2018-01-08 ENCOUNTER — Ambulatory Visit (INDEPENDENT_AMBULATORY_CARE_PROVIDER_SITE_OTHER): Payer: BLUE CROSS/BLUE SHIELD

## 2018-01-08 VITALS — BP 138/84 | HR 96 | Ht 61.0 in | Wt 100.0 lb

## 2018-01-08 DIAGNOSIS — M25511 Pain in right shoulder: Secondary | ICD-10-CM

## 2018-01-08 DIAGNOSIS — M7501 Adhesive capsulitis of right shoulder: Secondary | ICD-10-CM | POA: Diagnosis not present

## 2018-01-08 DIAGNOSIS — G8929 Other chronic pain: Secondary | ICD-10-CM | POA: Diagnosis not present

## 2018-01-08 MED ORDER — PREDNISONE 10 MG (48) PO TBPK: ORAL_TABLET | Freq: Every day | ORAL | 1 refills | Status: DC

## 2018-01-08 NOTE — Progress Notes (Signed)
Progress Note    Patient ID: Carla Wise, female   DOB: [birth year], 52 y.o.   MRN: [id number]  Chief Complaint  Patient presents with  . Shoulder Pain    right shoulder pain with decreased ROM Since October 2018 no injury     52 year old female no history of diabetes but she is a smoker presents with history of left shoulder adhesive capsulitis which required long course of physical therapy and then manipulation under anesthesia which was successful  She now complains of 34-month history of progressive pain and stiffness in the right shoulder.  She is concerned that despite her own physical therapy at home that the shoulder is still hurting and may be stiffening  She has a dull constant mild to moderate pain which is worse at night.  She has not really lost a significant amount of motion that he see below.  No history of trauma.     Review of Systems  Constitutional: Negative for chills and fever.  Musculoskeletal: Negative for neck pain.  Skin: Negative for rash.  Neurological: Negative for tingling.   No outpatient medications have been marked as taking for the 01/08/18 encounter (Office Visit) with Carole Civil, MD.    Past Medical History:  Diagnosis Date  . Constipation   . Gastric adenoma 02/2017  . PONV (postoperative nausea and vomiting)      Allergies  Allergen Reactions  . Azithromycin     Patient gets worst whenever she takes a Z-Pak  . Levaquin [Levofloxacin In D5w]     Pt states she gets worst.     BP 138/84   Pulse 96   Ht 5\' 1"  (1.549 m)   Wt 100 lb (45.4 kg)   BMI 18.89 kg/m    Physical Exam  Constitutional: She is oriented to person, place, and time. She appears well-developed and well-nourished.  Neurological: She is alert and oriented to person, place, and time.  Psychiatric: She has a normal mood and affect. Judgment normal.  Vitals reviewed.   Right Shoulder Exam   Tenderness  Right shoulder tenderness location: Tender around  the joint lines posterior anterior and deltoid.  Range of Motion  Active abduction: 90  Passive abduction: 90  Extension: 40  External rotation: 50  Forward flexion: 160  Internal rotation 0 degrees: abnormal  Internal rotation 90 degrees: abnormal   Muscle Strength  The patient has normal right shoulder strength.  Tests  Apprehension: negative  Other  Erythema: absent Sensation: normal Pulse: present   Left Shoulder Exam  Left shoulder exam is normal.  Tenderness  The patient is experiencing no tenderness.   Range of Motion  The patient has normal left shoulder ROM.  Muscle Strength  The patient has normal left shoulder strength.  Tests  Apprehension: negative  Other  Erythema: absent Sensation: normal Pulse: present        Medical decision-making  Imaging: See x-ray dictated report  X-ray shows slight narrowing of the glenohumeral joint greater tuberosity sclerosis  Encounter Diagnoses  Name Primary?  . Chronic right shoulder pain   . Adhesive capsulitis of right shoulder Yes    Recommend continue physical therapy at home hold on ibuprofen start prednisone  No orders of the defined types were placed in this encounter.  Return 1 month  [name], MD 01/08/2018 5:27 PM

## 2018-01-08 NOTE — Patient Instructions (Signed)
Steps to Quit Smoking Smoking tobacco can be bad for your health. It can also affect almost every organ in your body. Smoking puts you and people around you at risk for many serious long-lasting (chronic) diseases. Quitting smoking is hard, but it is one of the best things that you can do for your health. It is never too late to quit. What are the benefits of quitting smoking? When you quit smoking, you lower your risk for getting serious diseases and conditions. They can include:  Lung cancer or lung disease.  Heart disease.  Stroke.  Heart attack.  Not being able to have children (infertility).  Weak bones (osteoporosis) and broken bones (fractures).  If you have coughing, wheezing, and shortness of breath, those symptoms may get better when you quit. You may also get sick less often. If you are pregnant, quitting smoking can help to lower your chances of having a baby of low birth weight. What can I do to help me quit smoking? Talk with your doctor about what can help you quit smoking. Some things you can do (strategies) include:  Quitting smoking totally, instead of slowly cutting back how much you smoke over a period of time.  Going to in-person counseling. You are more likely to quit if you go to many counseling sessions.  Using resources and support systems, such as: ? Online chats with a counselor. ? Phone quitlines. ? Printed self-help materials. ? Support groups or group counseling. ? Text messaging programs. ? Mobile phone apps or applications.  Taking medicines. Some of these medicines may have nicotine in them. If you are pregnant or breastfeeding, do not take any medicines to quit smoking unless your doctor says it is okay. Talk with your doctor about counseling or other things that can help you.  Talk with your doctor about using more than one strategy at the same time, such as taking medicines while you are also going to in-person counseling. This can help make  quitting easier. What things can I do to make it easier to quit? Quitting smoking might feel very hard at first, but there is a lot that you can do to make it easier. Take these steps:  Talk to your family and friends. Ask them to support and encourage you.  Call phone quitlines, reach out to support groups, or work with a counselor.  Ask people who smoke to not smoke around you.  Avoid places that make you want (trigger) to smoke, such as: ? Bars. ? Parties. ? Smoke-break areas at work.  Spend time with people who do not smoke.  Lower the stress in your life. Stress can make you want to smoke. Try these things to help your stress: ? Getting regular exercise. ? Deep-breathing exercises. ? Yoga. ? Meditating. ? Doing a body scan. To do this, close your eyes, focus on one area of your body at a time from head to toe, and notice which parts of your body are tense. Try to relax the muscles in those areas.  Download or buy apps on your mobile phone or tablet that can help you stick to your quit plan. There are many free apps, such as QuitGuide from the CDC (Centers for Disease Control and Prevention). You can find more support from smokefree.gov and other websites.  This information is not intended to replace advice given to you by your health care provider. Make sure you discuss any questions you have with your health care provider. Document Released: 09/22/2009 Document   Revised: 07/24/2016 Document Reviewed: 04/12/2015 Elsevier Interactive Patient Education  2018 Elsevier Inc.  

## 2018-01-22 ENCOUNTER — Ambulatory Visit: Payer: BLUE CROSS/BLUE SHIELD | Admitting: Gastroenterology

## 2018-01-24 ENCOUNTER — Encounter: Payer: Self-pay | Admitting: Nurse Practitioner

## 2018-01-24 ENCOUNTER — Ambulatory Visit: Payer: BLUE CROSS/BLUE SHIELD | Admitting: Nurse Practitioner

## 2018-01-24 VITALS — BP 122/86 | Ht 61.0 in | Wt 104.8 lb

## 2018-01-24 DIAGNOSIS — K644 Residual hemorrhoidal skin tags: Secondary | ICD-10-CM | POA: Diagnosis not present

## 2018-01-24 MED ORDER — HYDROCORTISONE ACE-PRAMOXINE 1-1 % RE FOAM: 1.0000 | Freq: Two times a day (BID) | RECTAL | 0 refills | Status: DC

## 2018-01-24 NOTE — Patient Instructions (Signed)
Hemorrhoids Hemorrhoids are swollen veins in and around the rectum or anus. There are two types of hemorrhoids:  Internal hemorrhoids. These occur in the veins that are just inside the rectum. They may poke through to the outside and become irritated and painful.  External hemorrhoids. These occur in the veins that are outside of the anus and can be felt as a painful swelling or hard lump near the anus.  Most hemorrhoids do not cause serious problems, and they can be managed with home treatments such as diet and lifestyle changes. If home treatments do not help your symptoms, procedures can be done to shrink or remove the hemorrhoids. What are the causes? This condition is caused by increased pressure in the anal area. This pressure may result from various things, including:  Constipation.  Straining to have a bowel movement.  Diarrhea.  Pregnancy.  Obesity.  Sitting for long periods of time.  Heavy lifting or other activity that causes you to strain.  Anal sex.  What are the signs or symptoms? Symptoms of this condition include:  Pain.  Anal itching or irritation.  Rectal bleeding.  Leakage of stool (feces).  Anal swelling.  One or more lumps around the anus.  How is this diagnosed? This condition can often be diagnosed through a visual exam. Other exams or tests may also be done, such as:  Examination of the rectal area with a gloved hand (digital rectal exam).  Examination of the anal canal using a small tube (anoscope).  A blood test, if you have lost a significant amount of blood.  A test to look inside the colon (sigmoidoscopy or colonoscopy).  How is this treated? This condition can usually be treated at home. However, various procedures may be done if dietary changes, lifestyle changes, and other home treatments do not help your symptoms. These procedures can help make the hemorrhoids smaller or remove them completely. Some of these procedures involve  surgery, and others do not. Common procedures include:  Rubber band ligation. Rubber bands are placed at the base of the hemorrhoids to cut off the blood supply to them.  Sclerotherapy. Medicine is injected into the hemorrhoids to shrink them.  Infrared coagulation. A type of light energy is used to get rid of the hemorrhoids.  Hemorrhoidectomy surgery. The hemorrhoids are surgically removed, and the veins that supply them are tied off.  Stapled hemorrhoidopexy surgery. A circular stapling device is used to remove the hemorrhoids and use staples to cut off the blood supply to them.  Follow these instructions at home: Eating and drinking  Eat foods that have a lot of fiber in them, such as whole grains, beans, nuts, fruits, and vegetables. Ask your health care provider about taking products that have added fiber (fiber supplements).  Drink enough fluid to keep your urine clear or pale yellow. Managing pain and swelling  Take warm sitz baths for 20 minutes, 3-4 times a day to ease pain and discomfort.  If directed, apply ice to the affected area. Using ice packs between sitz baths may be helpful. ? Put ice in a plastic bag. ? Place a towel between your skin and the bag. ? Leave the ice on for 20 minutes, 2-3 times a day. General instructions  Take over-the-counter and prescription medicines only as told by your health care provider.  Use medicated creams or suppositories as told.  Exercise regularly.  Go to the bathroom when you have the urge to have a bowel movement. Do not wait.    Avoid straining to have bowel movements.  Keep the anal area dry and clean. Use wet toilet paper or moist towelettes after a bowel movement.  Do not sit on the toilet for long periods of time. This increases blood pooling and pain. Contact a health care provider if:  You have increasing pain and swelling that are not controlled by treatment or medicine.  You have uncontrolled bleeding.  You  have difficulty having a bowel movement, or you are unable to have a bowel movement.  You have pain or inflammation outside the area of the hemorrhoids. This information is not intended to replace advice given to you by your health care provider. Make sure you discuss any questions you have with your health care provider. Document Released: 11/23/2000 Document Revised: 04/25/2016 Document Reviewed: 08/10/2015 Elsevier Interactive Patient Education  2018 Elsevier Inc.  

## 2018-01-25 ENCOUNTER — Encounter: Payer: Self-pay | Admitting: Nurse Practitioner

## 2018-01-25 NOTE — Progress Notes (Signed)
Subjective:  Presents for possible hemorrhoid that began on 2/9. Much better. Has done sitz baths and Preparation H. Also Tucks pads. No diarrhea or constipation. Had a small BM today which she describes as uncomfortable. No bleeding or abdominal pain. No fever. No history of hemorrhoids.   Objective:   BP 122/86   Ht 5\' 1"  (1.549 m)   Wt 104 lb 12.8 oz (47.5 kg)   BMI 19.80 kg/m  NAD. Alert, oriented. Lungs clear. Heart RRR. Abdomen soft, non tender. Rectal area: small pink external hemorrhoid.   Assessment:  External hemorrhoid    Plan:   Meds ordered this encounter  Medications  . hydrocortisone-pramoxine (PROCTOFOAM-HC) rectal foam    Sig: Place 1 applicator rectally 2 (two) times daily.    Dispense:  10 g    Refill:  0    Order Specific Question:   Supervising Provider    Answer:   Mikey Kirschner [[id number]]   Continue current measures. Expect continued gradual resolution. Call back if worsens or persists.

## 2018-02-05 ENCOUNTER — Ambulatory Visit: Payer: BLUE CROSS/BLUE SHIELD | Admitting: Orthopedic Surgery

## 2018-02-05 ENCOUNTER — Encounter: Payer: Self-pay | Admitting: Orthopedic Surgery

## 2018-02-05 VITALS — BP 143/86 | HR 72 | Ht 61.0 in | Wt 100.0 lb

## 2018-02-05 DIAGNOSIS — M7501 Adhesive capsulitis of right shoulder: Secondary | ICD-10-CM | POA: Diagnosis not present

## 2018-02-05 MED ORDER — IBUPROFEN 800 MG PO TABS: 800.0000 mg | ORAL_TABLET | Freq: Three times a day (TID) | ORAL | 1 refills | Status: DC | PRN

## 2018-02-05 NOTE — Progress Notes (Signed)
Follow-up visit  Chief Complaint  Patient presents with  . Shoulder Pain    right     This is a 52 year old female presents for reevaluation of her right shoulder diagnosis adhesive capsulitis she is currently undergoing home exercise program which she does diligently and she says she does have some night pain but her shoulder is not stiff  Review of systems she reports no catching locking or giving way but mild pain in the right shoulder  BP (!) 143/86   Pulse 72   Ht 5\' 1"  (1.549 m)   Wt 100 lb (45.4 kg)   BMI 18.89 kg/m  She presents today as a awake alert and oriented well-developed well-nourished thin female in no acute distress  Right shoulder Examination shows her external rotation is 50 degrees her active forward elevation is 160 degrees she has some mild pain with external rotation her shoulder is stable she is neurovascularly intact  Encounter Diagnosis  Name Primary?  . Adhesive capsulitis of right shoulder Yes    I prescribed ibuprofen for her 800 mg every 8  She will continue home exercises and follow-up with [place] as needed if stiffening occurs

## 2018-02-05 NOTE — Patient Instructions (Signed)
Continue home exercise program.  If things start to stiffen up please call us for reevaluation otherwise no follow-up needed

## 2018-02-07 ENCOUNTER — Other Ambulatory Visit: Payer: Self-pay | Admitting: Nurse Practitioner

## 2018-02-07 ENCOUNTER — Telehealth: Payer: Self-pay | Admitting: Family Medicine

## 2018-02-07 MED ORDER — ALPRAZOLAM 0.5 MG PO TABS: ORAL_TABLET | ORAL | 2 refills | Status: DC

## 2018-02-07 NOTE — Telephone Encounter (Signed)
Requesting refill on Alprazolam 0.5 mg tabs to [facility].  She said she never picked up any refills on this and her Rx has expired.

## 2018-02-07 NOTE — Telephone Encounter (Signed)
No pharmacy listed on message. Sent to [facility].

## 2018-02-07 NOTE — Telephone Encounter (Signed)
Last seen 01/24/2018 by Hoyle Sauer.

## 2018-03-24 ENCOUNTER — Encounter: Payer: Self-pay | Admitting: Nurse Practitioner

## 2018-03-24 ENCOUNTER — Ambulatory Visit: Payer: BLUE CROSS/BLUE SHIELD | Admitting: Nurse Practitioner

## 2018-03-24 VITALS — BP 110/72 | Temp 98.5°F | Ht 61.0 in | Wt 105.2 lb

## 2018-03-24 DIAGNOSIS — Z13220 Encounter for screening for lipoid disorders: Secondary | ICD-10-CM | POA: Diagnosis not present

## 2018-03-24 DIAGNOSIS — J069 Acute upper respiratory infection, unspecified: Secondary | ICD-10-CM | POA: Diagnosis not present

## 2018-03-24 DIAGNOSIS — B9689 Other specified bacterial agents as the cause of diseases classified elsewhere: Secondary | ICD-10-CM | POA: Diagnosis not present

## 2018-03-24 DIAGNOSIS — R5383 Other fatigue: Secondary | ICD-10-CM

## 2018-03-24 MED ORDER — CEFDINIR 300 MG PO CAPS: 300.0000 mg | ORAL_CAPSULE | Freq: Two times a day (BID) | ORAL | 0 refills | Status: DC

## 2018-03-24 NOTE — Patient Instructions (Signed)
Mucinex DM

## 2018-03-25 ENCOUNTER — Encounter: Payer: Self-pay | Admitting: Nurse Practitioner

## 2018-03-25 NOTE — Progress Notes (Signed)
Subjective:  Presents for c/o cough for the past 2 days. No fever. Scratchy throat. Non productive cough all night last night. Left ear pain. No headache, runny nose or wheezing. Also mentions that she has had increased fatigue for 3-4 weeks. No CP or SOB. No unexplained weight loss. Continues to smoke.   Objective:   BP 110/72   Temp 98.5 F (36.9 C) (Oral)   Ht 5\' 1"  (1.549 m)   Wt 105 lb 3.2 oz (47.7 kg)   BMI 19.88 kg/m  NAD. Alert, oriented. TM retracted, clear effusion. Pharynx injected with green PND noted. Neck supple with mild anterior adenopathy. Lungs clear. Heart RRR.   Assessment:  Bacterial upper respiratory infection  Fatigue, unspecified type - Plan: CBC with Differential/Platelet, Basic metabolic panel, Hepatic function panel, TSH, VITAMIN D 25 Hydroxy (Vit-D Deficiency, Fractures)  Screening cholesterol level - Plan: Lipid panel    Plan:   Meds ordered this encounter  Medications  . cefdinir (OMNICEF) 300 MG capsule    Sig: Take 1 capsule (300 mg total) by mouth 2 (two) times daily.    Dispense:  20 capsule    Refill:  0    Order Specific Question:   Supervising Provider    Answer:   Mikey Kirschner [[id number]]   Mucinex DM as directed for cough. Labs pending. Further follow up based on lab results. Call back if fatigue or cough persists.

## 2018-03-27 ENCOUNTER — Telehealth: Payer: Self-pay | Admitting: Family Medicine

## 2018-03-27 ENCOUNTER — Other Ambulatory Visit: Payer: Self-pay | Admitting: *Deleted

## 2018-03-27 MED ORDER — AMOXICILLIN-POT CLAVULANATE 875-125 MG PO TABS: 1.0000 | ORAL_TABLET | Freq: Two times a day (BID) | ORAL | 0 refills | Status: DC

## 2018-03-27 MED ORDER — PREDNISONE 20 MG PO TABS: ORAL_TABLET | ORAL | 0 refills | Status: DC

## 2018-03-27 NOTE — Telephone Encounter (Signed)
Saw Carla Wise on Monday and has not gotten any better. Congestion is still bad and cough is keeping her awake at night.  She said Hoyle Sauer said to call back if the medicine was not working and we would change it.  Also needs something to help with cough at night because she is not getting any sleep.  [place]

## 2018-03-27 NOTE — Telephone Encounter (Signed)
Discussed with pt and meds sent to pharm.  

## 2018-03-27 NOTE — Telephone Encounter (Signed)
I would recommend switching to Augmentin 875 mg 1 twice daily-please explained to the patient that this is a powerful form of amoxicillin rated as the best for upper respiratory bacterial infections.  Take twice daily for 10 days, it is possible this could cause some diarrhea.  Stop Omnicef.  Also prednisone 20 mg tablet 2 daily for 5 days-if high fevers difficulty breathing or if worse follow-up immediately

## 2018-03-27 NOTE — Telephone Encounter (Signed)
Seen 4/15. Prescribed omnicef 300mg . Taking mucinex dm. Pt states she is getting worse. No trouble breathing, no fever, congestion in head, cough is worse, drainage is yellow, ears feel full.  [place]

## 2018-03-31 DIAGNOSIS — Z13220 Encounter for screening for lipoid disorders: Secondary | ICD-10-CM | POA: Diagnosis not present

## 2018-03-31 DIAGNOSIS — R5383 Other fatigue: Secondary | ICD-10-CM | POA: Diagnosis not present

## 2018-04-01 LAB — CBC WITH DIFFERENTIAL/PLATELET
Basophils Absolute: 0 10*3/uL (ref 0.0–0.2)
Basos: 0 %
EOS (ABSOLUTE): 0.1 10*3/uL (ref 0.0–0.4)
Eos: 1 %
Hematocrit: 41.3 % (ref 34.0–46.6)
Hemoglobin: 14 g/dL (ref 11.1–15.9)
Immature Grans (Abs): 0 10*3/uL (ref 0.0–0.1)
Immature Granulocytes: 0 %
Lymphocytes Absolute: 2.7 10*3/uL (ref 0.7–3.1)
Lymphs: 29 %
MCH: 29.8 pg (ref 26.6–33.0)
MCHC: 33.9 g/dL (ref 31.5–35.7)
MCV: 88 fL (ref 79–97)
Monocytes Absolute: 0.7 10*3/uL (ref 0.1–0.9)
Monocytes: 8 %
Neutrophils Absolute: 5.7 10*3/uL (ref 1.4–7.0)
Neutrophils: 62 %
Platelets: 322 10*3/uL (ref 150–379)
RBC: 4.7 x10E6/uL (ref 3.77–5.28)
RDW: 14.8 % (ref 12.3–15.4)
WBC: 9.2 10*3/uL (ref 3.4–10.8)

## 2018-04-01 LAB — BASIC METABOLIC PANEL
BUN/Creatinine Ratio: 19 (ref 9–23)
BUN: 13 mg/dL (ref 6–24)
CO2: 27 mmol/L (ref 20–29)
Calcium: 10.1 mg/dL (ref 8.7–10.2)
Chloride: 105 mmol/L (ref 96–106)
Creatinine, Ser: 0.7 mg/dL (ref 0.57–1.00)
GFR calc Af Amer: 116 mL/min/{1.73_m2} (ref >59)
GFR calc non Af Amer: 101 mL/min/{1.73_m2} (ref >59)
Glucose: 86 mg/dL (ref 65–99)
Potassium: 4 mmol/L (ref 3.5–5.2)
Sodium: 145 mmol/L — ABNORMAL HIGH (ref 134–144)

## 2018-04-01 LAB — HEPATIC FUNCTION PANEL
ALT: 8 IU/L (ref 0–32)
AST: 11 IU/L (ref 0–40)
Albumin: 4.6 g/dL (ref 3.5–5.5)
Alkaline Phosphatase: 70 IU/L (ref 39–117)
Bilirubin Total: <0.2 mg/dL (ref 0.0–1.2)
Bilirubin, Direct: 0.06 mg/dL (ref 0.00–0.40)
Total Protein: 6.3 g/dL (ref 6.0–8.5)

## 2018-04-01 LAB — TSH: TSH: 0.606 u[IU]/mL (ref 0.450–4.500)

## 2018-04-01 LAB — LIPID PANEL
Chol/HDL Ratio: 4.5 ratio — ABNORMAL HIGH (ref 0.0–4.4)
Cholesterol, Total: 193 mg/dL (ref 100–199)
HDL: 43 mg/dL (ref >39)
LDL Calculated: 122 mg/dL — ABNORMAL HIGH (ref 0–99)
Triglycerides: 140 mg/dL (ref 0–149)
VLDL Cholesterol Cal: 28 mg/dL (ref 5–40)

## 2018-04-01 LAB — VITAMIN D 25 HYDROXY (VIT D DEFICIENCY, FRACTURES): Vit D, 25-Hydroxy: 25.5 ng/mL — ABNORMAL LOW (ref 30.0–100.0)

## 2018-05-14 ENCOUNTER — Encounter: Payer: Self-pay | Admitting: Gastroenterology

## 2018-06-16 ENCOUNTER — Telehealth: Payer: Self-pay | Admitting: *Deleted

## 2018-06-16 NOTE — Telephone Encounter (Signed)
Pt called states she has been having chest pain in center of chest when lying down at night. Feels like something heavy of chest. Comes and goes for the past two weeks. Pain last 3 -4 mins. Also having left arm pain that sometimes comes happens with chest pain and sometimes comes without chest pain. Pt advised to go to ED. Pt states she is not going to go to ED that it was not that bad. Pt advised it could be serious but still declined. She wanted appt with carolyn this week after 3:30. Consult with carolyn and ok to give appt tomorrow after 3:30 but advised pt again to go to ED if symptoms return. Pt verbalized understanding.

## 2018-06-16 NOTE — Telephone Encounter (Signed)
Noted  

## 2018-06-17 ENCOUNTER — Encounter: Payer: Self-pay | Admitting: Nurse Practitioner

## 2018-06-17 ENCOUNTER — Ambulatory Visit: Payer: BLUE CROSS/BLUE SHIELD | Admitting: Nurse Practitioner

## 2018-06-17 ENCOUNTER — Ambulatory Visit (HOSPITAL_COMMUNITY)
Admission: RE | Admit: 2018-06-17 | Discharge: 2018-06-17 | Disposition: A | Discharge status: Home / Self Care | Payer: BLUE CROSS/BLUE SHIELD | Source: Ambulatory Visit | Attending: Nurse Practitioner | Admitting: Nurse Practitioner

## 2018-06-17 VITALS — BP 142/82 | Ht 61.0 in | Wt 103.6 lb

## 2018-06-17 DIAGNOSIS — J439 Emphysema, unspecified: Secondary | ICD-10-CM | POA: Diagnosis not present

## 2018-06-17 DIAGNOSIS — F419 Anxiety disorder, unspecified: Secondary | ICD-10-CM | POA: Diagnosis not present

## 2018-06-17 DIAGNOSIS — R079 Chest pain, unspecified: Secondary | ICD-10-CM

## 2018-06-17 DIAGNOSIS — R0602 Shortness of breath: Secondary | ICD-10-CM | POA: Diagnosis not present

## 2018-06-18 ENCOUNTER — Telehealth: Payer: Self-pay | Admitting: Nurse Practitioner

## 2018-06-18 ENCOUNTER — Encounter: Payer: Self-pay | Admitting: Nurse Practitioner

## 2018-06-18 DIAGNOSIS — F419 Anxiety disorder, unspecified: Secondary | ICD-10-CM | POA: Insufficient documentation

## 2018-06-18 NOTE — Telephone Encounter (Signed)
Any chest discomfort?

## 2018-06-18 NOTE — Telephone Encounter (Signed)
Patient wanted to let Hoyle Sauer know that she slept a good 5 hours last night with the medication she gave her.

## 2018-06-18 NOTE — Telephone Encounter (Signed)
Continue Xanax especially at bedtime. Call back if symptoms persist.

## 2018-06-18 NOTE — Progress Notes (Signed)
Subjective:  Presents for c/o chest pressure unassociated with activity or meals. Lasts usually a few seconds, at most 2 minutes. Worse when she lies down on her back. Better with sitting up or lying on her side. Very sporadic, worse on some days. Occasional SOB, not necessarily associated with chest pressure. No fever. No unusual cough. Some sweats, again unassociated with chest pain. Smokes about 8 cigs per day. No LE edema. Reflux stable. Has had extreme stress lately due to some family issues. Takes occasional Xanax. This has helped her anxiety and sleep.   Objective:   BP (!) 142/82   Ht 5\' 1"  (1.549 m)   Wt 103 lb 9.6 oz (47 kg)   BMI 19.58 kg/m  NAD. Alert, oriented. Mildly anxious affect. Lungs clear, BS mildly diminished. Heart RRR. No murmur or gallop. EKG normal, no ischemic changes. LE: no edema. Abdomen soft, non distended non tender. See last labs 03/31/18. O2 sat 96%.  Assessment:   Problem List Items Addressed This Visit      Other   Anxiety    Other Visit Diagnoses    Chest pain, unspecified type    -  Primary   Relevant Orders   PR ELECTROCARDIOGRAM, COMPLETE   DG Chest 2 View (Completed)       Plan:  Xray pending. Discussed importance of stress reduction and smoking cessation. Recommend trial of Xanax tonight to see if this relieves the symptoms. Defers daily medication for stress.  Warning signs reviewed. Seek help immediately if symptoms worsen or change.  25 minutes was spent with the patient.  This statement verifies that 25 minutes was indeed spent with the patient.  More than 50% of this visit-total duration of the visit-was spent in counseling and coordination of care. The issues that the patient came in for today as reflected in the diagnosis (s) please refer to documentation for further details.

## 2018-06-18 NOTE — Telephone Encounter (Signed)
No chest discomfort. Slept good for about 5 hours.

## 2018-06-19 NOTE — Telephone Encounter (Signed)
Discussed with pt. Pt verbalized understanding.  °

## 2018-06-25 ENCOUNTER — Encounter: Payer: Self-pay | Admitting: Family Medicine

## 2018-07-14 ENCOUNTER — Other Ambulatory Visit: Payer: Self-pay | Admitting: Nurse Practitioner

## 2018-07-14 NOTE — Telephone Encounter (Signed)
May have this and 1 refill 

## 2018-07-22 ENCOUNTER — Ambulatory Visit (INDEPENDENT_AMBULATORY_CARE_PROVIDER_SITE_OTHER): Payer: BLUE CROSS/BLUE SHIELD | Admitting: Orthopedic Surgery

## 2018-07-22 ENCOUNTER — Encounter: Payer: Self-pay | Admitting: Orthopedic Surgery

## 2018-07-22 VITALS — BP 121/81 | HR 90 | Ht 62.0 in | Wt 101.0 lb

## 2018-07-22 DIAGNOSIS — M7551 Bursitis of right shoulder: Secondary | ICD-10-CM

## 2018-07-22 DIAGNOSIS — M7501 Adhesive capsulitis of right shoulder: Secondary | ICD-10-CM | POA: Diagnosis not present

## 2018-07-22 MED ORDER — PREDNISONE 10 MG (48) PO TBPK
ORAL_TABLET | Freq: Every day | ORAL | 0 refills | Status: DC
Start: 2018-07-22 — End: 2018-08-06

## 2018-07-22 NOTE — Progress Notes (Signed)
Chief Complaint  Patient presents with  . Shoulder Pain    right shoulder painful and decreased ROM     52 year old female presents with recurrent pain and decreased range of motion right shoulder.  Status post manipulation left shoulder.  Status post injection right shoulder with good result.  She does do home exercises on the right shoulder.  She denies any new trauma just increased pain and increased pain at night  Review of Systems  Constitutional: Negative for chills and fever.  Musculoskeletal: Positive for joint pain.  Neurological: Negative for tingling, focal weakness and weakness.   BP 121/81   Pulse 90   Ht 5\' 2"  (1.575 m)   Wt 101 lb (45.8 kg)   BMI 18.47 kg/m  Physical Exam  Constitutional: She is oriented to person, place, and time. She appears well-developed and well-nourished.  Musculoskeletal:  Tenderness posterior lateral and anterior acromion  Passive external rotation 40 degrees.  Active flexion scapular plane 150 degrees passive 180 degrees  Painful arc of motion between 90 and 180 degrees flexion scapular plane  No weakness in the rotator cuff  Neurological: She is alert and oriented to person, place, and time. She displays no atrophy and no tremor. No sensory deficit.  Skin: Skin is warm and dry. Capillary refill takes less than 2 seconds.  Psychiatric: She has a normal mood and affect. Judgment normal.  Vitals reviewed.   Encounter Diagnoses  Name Primary?  . Bursitis of right shoulder   . Adhesive capsulitis of right shoulder Yes    Meds ordered this encounter  Medications  . predniSONE (STERAPRED UNI-PAK 48 TAB) 10 MG (48) TBPK tablet    Sig: Take by mouth daily. 10 mg ds 12 days    Dispense:  48 tablet    Refill:  0     Procedure note the subacromial injection shoulder RIGHT  Verbal consent was obtained to inject the  RIGHT   Shoulder  Timeout was completed to confirm the injection site is a subacromial space of the  RIGHT   shoulder   Medication used Depo-Medrol 40 mg and lidocaine 1% 3 cc  Anesthesia was provided by ethyl chloride  The injection was performed in the RIGHT  posterior subacromial space. After pinning the skin with alcohol and anesthetized the skin with ethyl chloride the subacromial space was injected using a 20-gauge needle. There were no complications  Sterile dressing was applied.  Fu 2 wks

## 2018-08-06 ENCOUNTER — Ambulatory Visit: Payer: BLUE CROSS/BLUE SHIELD | Admitting: Orthopedic Surgery

## 2018-08-06 ENCOUNTER — Encounter

## 2018-08-06 ENCOUNTER — Encounter: Payer: Self-pay | Admitting: Orthopedic Surgery

## 2018-08-06 VITALS — BP 140/83 | HR 110 | Ht 61.0 in | Wt 98.0 lb

## 2018-08-06 DIAGNOSIS — M7501 Adhesive capsulitis of right shoulder: Secondary | ICD-10-CM | POA: Diagnosis not present

## 2018-08-06 DIAGNOSIS — M7551 Bursitis of right shoulder: Secondary | ICD-10-CM

## 2018-08-06 DIAGNOSIS — F17210 Nicotine dependence, cigarettes, uncomplicated: Secondary | ICD-10-CM | POA: Diagnosis not present

## 2018-08-06 NOTE — Progress Notes (Signed)
Chief Complaint  Patient presents with  . Shoulder Pain    right     Routine follow-up Encounter Diagnoses  Name Primary?  . Adhesive capsulitis of right shoulder Yes  . Bursitis of right shoulder   . Cigarette nicotine dependence without complication     The patient improved significantly with the prednisone  She now has full forward elevation 50 degrees of external rotation and decreased pain  I have counseled her to stop smoking  She does not have diabetes she is had no recent trauma  Recommend continue exercises call us if things start to tighten up to get a new prednisone Dosepak

## 2018-08-13 ENCOUNTER — Ambulatory Visit: Payer: BLUE CROSS/BLUE SHIELD | Admitting: Orthopedic Surgery

## 2018-09-03 ENCOUNTER — Ambulatory Visit: Payer: BLUE CROSS/BLUE SHIELD | Admitting: Family Medicine

## 2018-09-03 ENCOUNTER — Encounter: Payer: Self-pay | Admitting: Family Medicine

## 2018-09-03 VITALS — BP 124/80 | Temp 98.7°F | Ht 61.0 in | Wt 100.6 lb

## 2018-09-03 DIAGNOSIS — J329 Chronic sinusitis, unspecified: Secondary | ICD-10-CM

## 2018-09-03 DIAGNOSIS — F419 Anxiety disorder, unspecified: Secondary | ICD-10-CM

## 2018-09-03 DIAGNOSIS — J310 Chronic rhinitis: Secondary | ICD-10-CM

## 2018-09-03 MED ORDER — ALPRAZOLAM 0.5 MG PO TABS: ORAL_TABLET | ORAL | 5 refills | Status: DC

## 2018-09-03 MED ORDER — AMOXICILLIN-POT CLAVULANATE 875-125 MG PO TABS: 1.0000 | ORAL_TABLET | Freq: Two times a day (BID) | ORAL | 0 refills | Status: DC

## 2018-09-03 NOTE — Progress Notes (Signed)
   Subjective:    Patient ID: Carla Wise, female    DOB: October 09, 1966, 52 y.o.   MRN: [id number]  Sinusitis  This is a new problem. Episode onset: 3 days. Associated symptoms include ear pain and a sore throat. Past treatments include nothing.   Requesting refill on xanax. Took last one today.   Frontal headache   Pos cong and cough   No gunky discharge      Pos  Patient compliant with insomnia medication. Generally takes most nights. No obvious morning drowsiness. Definitely helps patient sleep. Without it patient states would not get a good nights rest.    Review of Systems  HENT: Positive for ear pain and sore throat.        Objective:   Physical Exam   Alert, mild malaise. Hydration good Vitals stable. frontal/ maxillary tenderness evident positive nasal congestion. pharynx normal neck supple  lungs clear/no crackles or wheezes. heart regular in rhythm      Assessment & Plan:  Impression rhinosinusitis likely post viral, discussed with patient. plan antibiotics prescribed. Questions answered. Symptomatic care discussed. warning signs discussed. WSL/encouraged to quit smoking  Chronic anxiety discussed primarily insomnia Xanax refill the patient's request

## 2018-10-07 ENCOUNTER — Other Ambulatory Visit: Payer: Self-pay | Admitting: Orthopedic Surgery

## 2018-10-07 NOTE — Telephone Encounter (Signed)
Patient is asking for another prescription of Prednisone 10 MG.  Patient states Dr. Aline Brochure told her if her shoulder starts to tighten up to call and request.  PATIENT USES [facility]

## 2018-10-08 MED ORDER — PREDNISONE 10 MG (21) PO TBPK: ORAL_TABLET | ORAL | 0 refills | Status: DC

## 2018-10-18 IMAGING — MR MR SHOULDER*L* W/O CM
4 of 5 series · 19 of 40 positions shown · non-contrast
Comparison: Radiographs 02/05/2017

CLINICAL DATA: Left shoulder pain for 3 months.  No known injury.

EXAM:
MRI OF THE LEFT SHOULDER WITHOUT CONTRAST
TECHNIQUE: Multiplanar, multisequence MR imaging of the shoulder was performed.
No intravenous contrast was administered.

[Series 3: t2fs axial · axial · 3.0mm · 0.20mm/px · z∈[-13,+40]mm · 3 of 22 slices shown]
[im 4/22]
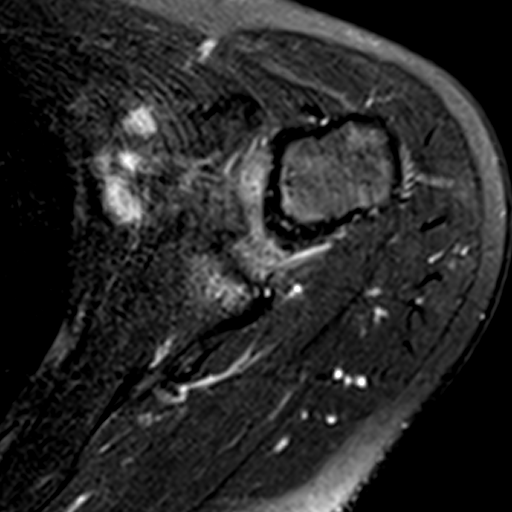
[im 13/22]
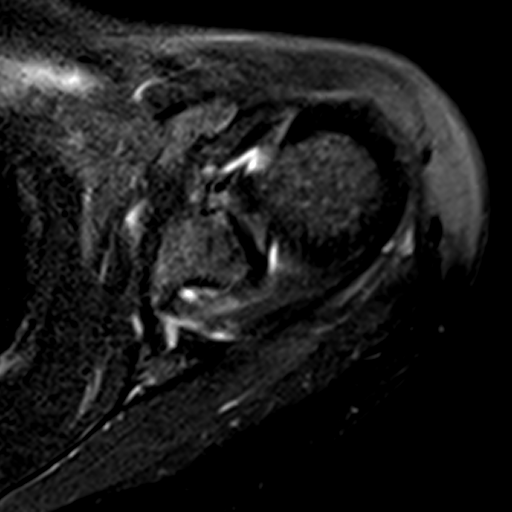
[im 19/22]
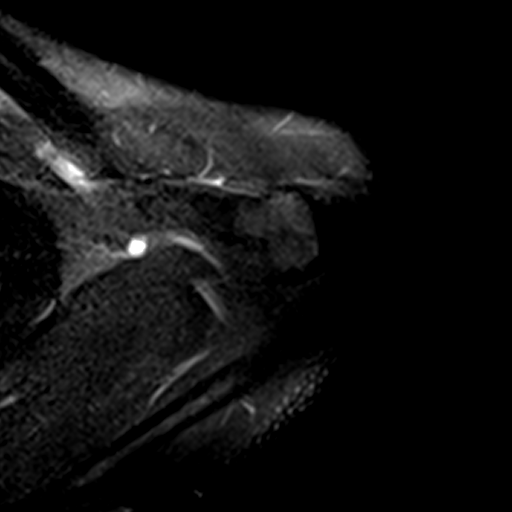

[Series 4: t2fs coronal · oblique · 3.0mm · 0.22mm/px · 3 of 20 slices shown]
[im 4/20]
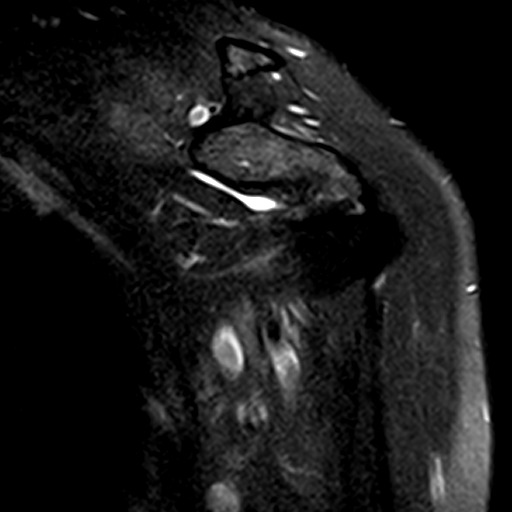
[im 10/20]
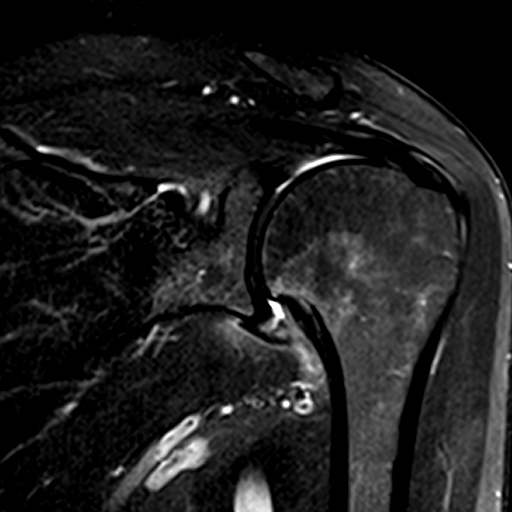
[im 16/20]
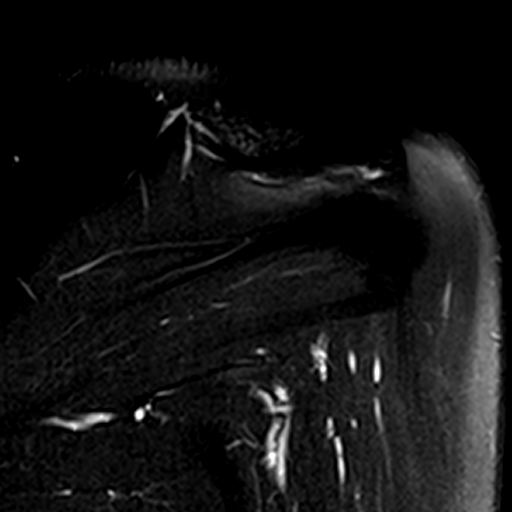

[Series 5: PD · oblique · 3.0mm · 0.22mm/px · 7 of 20 slices shown]
[im 1/20]
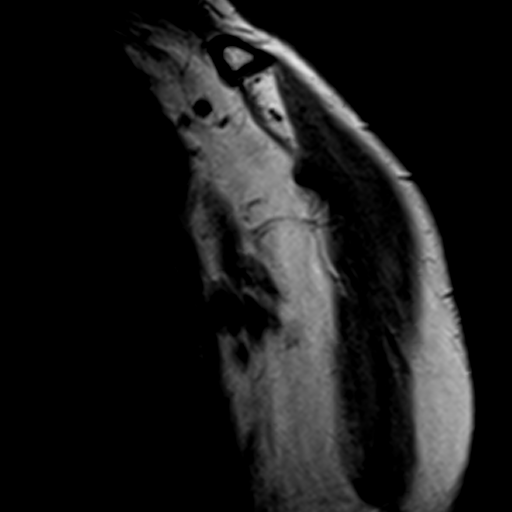
[im 4/20]
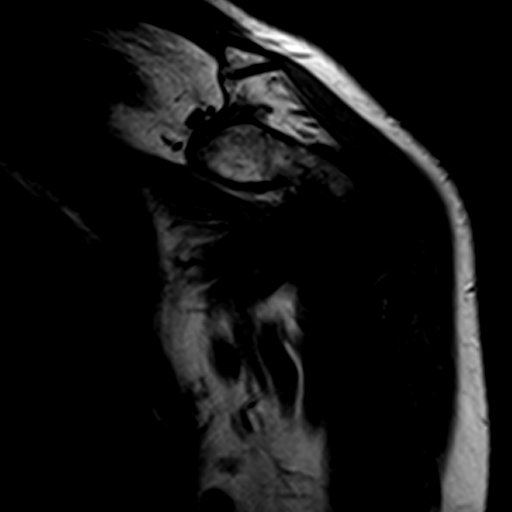
[im 7/20]
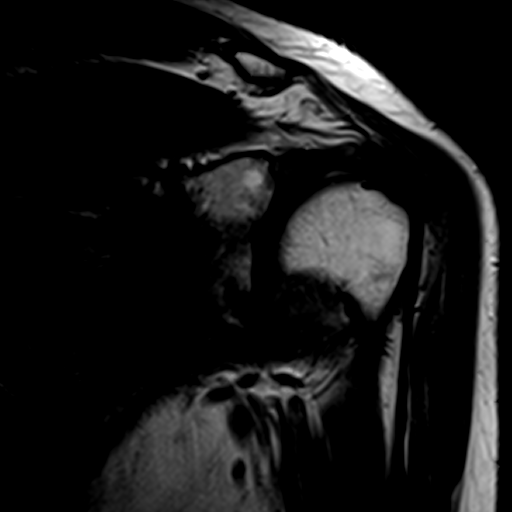
[im 10/20]
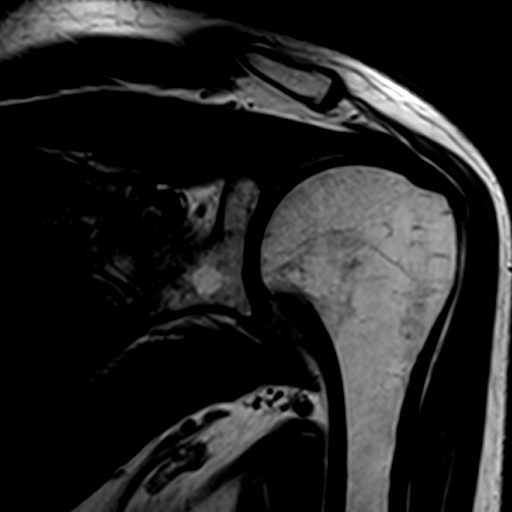
[im 13/20]
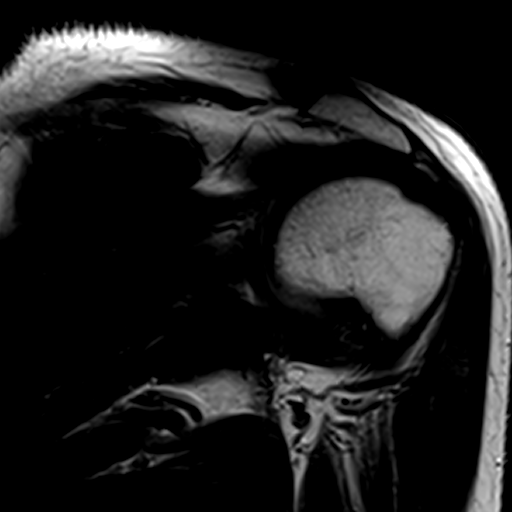
[im 16/20]
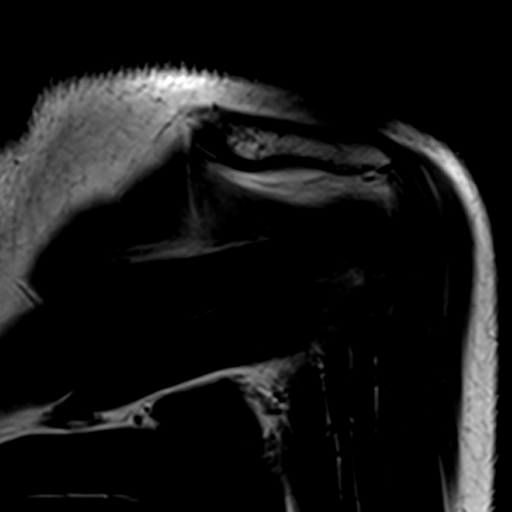
[im 20/20]
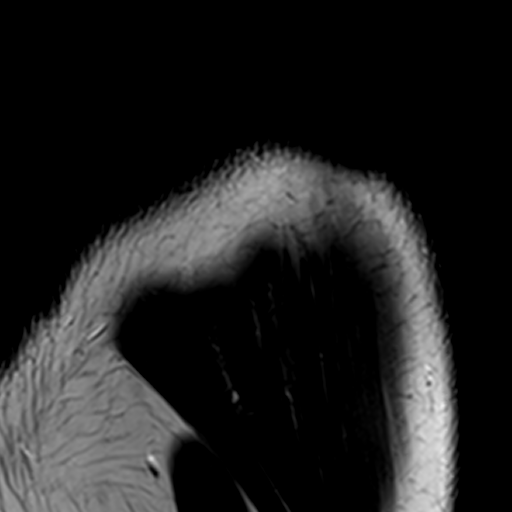

[Series 6: T1 · oblique · 3.0mm · 0.20mm/px · 6 of 24 slices shown]
[im 1/24]
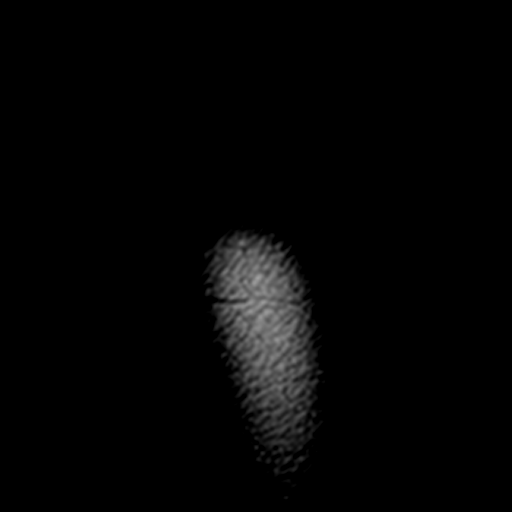
[im 3/24]
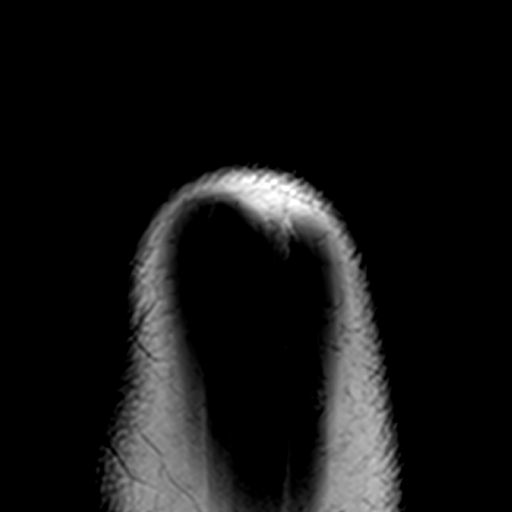
[im 6/24]
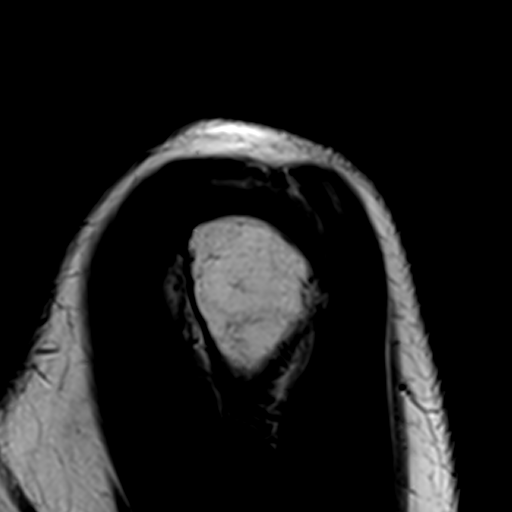
[im 9/24]
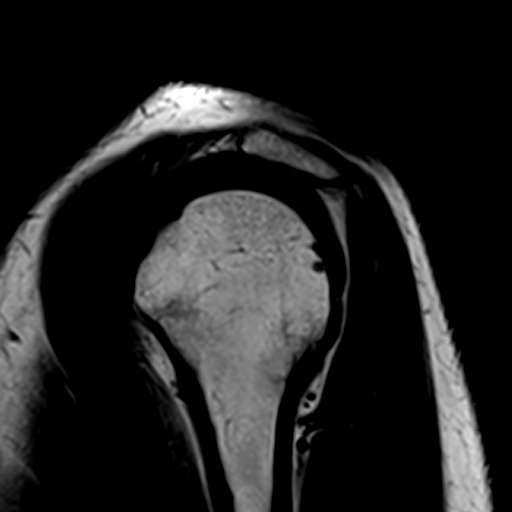
[im 12/24]
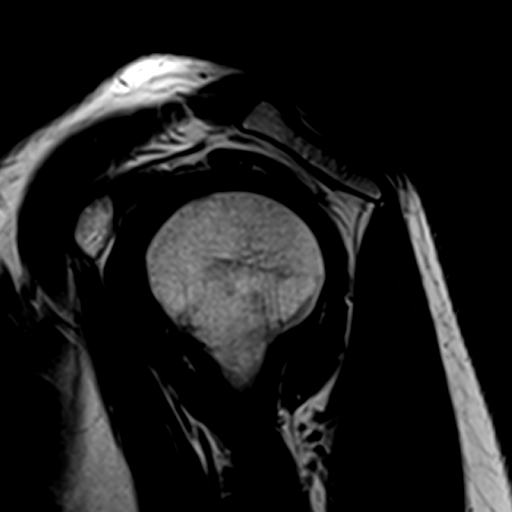
[im 21/24]
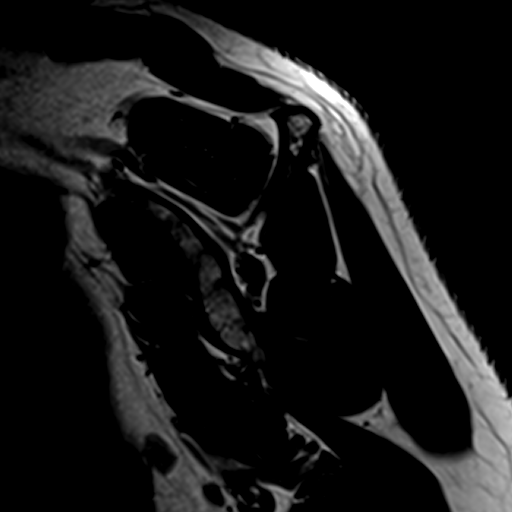

[19 of 40 positions shown; findings below may reference images not displayed]

FINDINGS: Rotator cuff: Mild rotator cuff tendinopathy/ tendinosis but no
partial or full thickness rotator cuff tear.

Muscles:  Normal

Biceps long head:  Intact

Acromioclavicular Joint: No significant degenerative changes. Type I
acromion. Moderate lateral downsloping but no definite undersurface
spurring.

Glenohumeral Joint: Early degenerative changes for age with early
osteophytic spurring and degenerative chondrosis. Small joint
effusion and Moderate synovitis with thickening and edema in the
region of the axillary recess.

Labrum:  No definite labral tears.

Bones: No acute bony findings. Mild reactive marrow edema in the
inferior aspect of the humeral head possibly associated with the
synovitis.

Other: Minimal subacromial/subdeltoid fluid.
IMPRESSION: 1. Mild rotator cuff tendinopathy/tendinosis. No partial or
full-thickness tear.
2. Intact long head biceps tendon and glenoid labrum.
3. Early, age advanced, glenohumeral joint degenerative changes,
joint effusion, synovitis and reactive marrow edema in the humeral
head. Could not exclude an inflammatory arthropathy.
4. Moderate lateral downsloping of the type 1 acromion.

## 2018-11-03 ENCOUNTER — Encounter: Payer: Self-pay | Admitting: Family Medicine

## 2018-11-03 ENCOUNTER — Ambulatory Visit: Payer: BLUE CROSS/BLUE SHIELD | Admitting: Family Medicine

## 2018-11-03 VITALS — BP 112/78 | Temp 97.8°F | Ht 61.0 in | Wt 107.2 lb

## 2018-11-03 DIAGNOSIS — R300 Dysuria: Secondary | ICD-10-CM | POA: Diagnosis not present

## 2018-11-03 DIAGNOSIS — N3000 Acute cystitis without hematuria: Secondary | ICD-10-CM

## 2018-11-03 LAB — POCT URINALYSIS DIPSTICK
Spec Grav, UA: 1.02 (ref 1.010–1.025)
pH, UA: 6 (ref 5.0–8.0)

## 2018-11-03 MED ORDER — NITROFURANTOIN MONOHYD MACRO 100 MG PO CAPS: 100.0000 mg | ORAL_CAPSULE | Freq: Two times a day (BID) | ORAL | 0 refills | Status: AC

## 2018-11-03 MED ORDER — MUPIROCIN 2 % EX OINT: 1.0000 "application " | TOPICAL_OINTMENT | Freq: Two times a day (BID) | CUTANEOUS | 0 refills | Status: DC | PRN

## 2018-11-03 NOTE — Progress Notes (Signed)
   Subjective:    Patient ID: Carla Wise, female    DOB: 1966/01/22, 52 y.o.   MRN: [id number]  HPI  Patient arrives with burinng with urination for a week, reports frequency, reports noting blood when she wipes, No fever, no N/V, no flank pain. Patient tried some OTC cream to see if it was a yeast infection but it did not go away. Denies vaginal discharge.    Review of Systems  Constitutional: Negative for chills and fever.  Gastrointestinal: Negative for abdominal pain, nausea and vomiting.  Genitourinary: Positive for dysuria, frequency and hematuria. Negative for flank pain, pelvic pain and vaginal discharge.       Objective:   Physical Exam  Constitutional: She is oriented to person, place, and time. She appears well-developed and well-nourished. No distress.  HENT:  Head: Normocephalic and atraumatic.  Neck: Neck supple.  Cardiovascular: Normal rate, regular rhythm and normal heart sounds.  No murmur heard. Pulmonary/Chest: Effort normal and breath sounds normal. No respiratory distress.  Abdominal: There is no tenderness.  Neurological: She is alert and oriented to person, place, and time.  Skin: Skin is warm and dry.  Psychiatric: She has a normal mood and affect.  Nursing note and vitals reviewed.  Results for orders placed or performed in visit on 11/03/18  POCT urinalysis dipstick  Result Value Ref Range   Color, UA     Clarity, UA     Glucose, UA     Bilirubin, UA     Ketones, UA     Spec Grav, UA 1.020 1.010 - 1.025   Blood, UA     pH, UA 6.0 5.0 - 8.0   Protein, UA     Urobilinogen, UA     Nitrite, UA     Leukocytes, UA Small (1+) (A) Negative   Appearance     Odor     Microscopic urine: occasional WBC per hpf. No RBCs seen and occasional epithelial cells seen.     Assessment & Plan:  Acute cystitis without hematuria  Dysuria - Plan: POCT urinalysis dipstick, Urine Culture  Likely UTI based on symptoms and positive microscopic exam.  Recommend  treatment with Macrobid and over-the-counter Azo.  Will send urine for culture.  If urine culture is positive recommend follow-up in about 3 weeks for repeat urine.  If urine culture is negative will need referral to urology for further evaluation of her blood on urination.  Discussed with patient and she is in agreement with this plan.  Warning signs discussed, follow-up if symptoms worsen or fail to improve.  Dr. Sallee Lange was consulted on this case and is in agreement with the above treatment plan.  Pt also requesting refill on the Bactroban ointment for fever blisters that she gets intranasally.  We will send this into her pharmacy.  Follow-up if persistent symptoms.

## 2018-11-03 NOTE — Patient Instructions (Signed)
May use over the counter AZO for burning discomfort for the next few days.

## 2018-11-05 LAB — SPECIMEN STATUS REPORT

## 2018-11-05 LAB — URINE CULTURE

## 2018-11-05 MED ORDER — AMOXICILLIN 500 MG PO CAPS: 500.0000 mg | ORAL_CAPSULE | Freq: Three times a day (TID) | ORAL | 0 refills | Status: DC

## 2018-11-05 NOTE — Addendum Note (Signed)
Addended by: [name] on: 11/05/2018 02:23 PM   Modules accepted: Orders

## 2018-12-01 ENCOUNTER — Telehealth: Payer: Self-pay | Admitting: Family Medicine

## 2018-12-01 ENCOUNTER — Ambulatory Visit: Payer: BLUE CROSS/BLUE SHIELD | Admitting: Family Medicine

## 2018-12-01 NOTE — Telephone Encounter (Signed)
Office visit not from [place] states to follow up with he in 3 weeks for repeat urine.  Patient notified and scheduled office visit.

## 2018-12-01 NOTE — Telephone Encounter (Signed)
Pt would like to know if she needs to schedule a follow up for the blood that was seen in her urine or if she just needs to come in and drop off a urine sample. She cant remember what Ria Comment wanted her to do.

## 2018-12-04 ENCOUNTER — Ambulatory Visit: Payer: BLUE CROSS/BLUE SHIELD | Admitting: Family Medicine

## 2018-12-15 ENCOUNTER — Encounter

## 2018-12-15 ENCOUNTER — Encounter: Payer: Self-pay | Admitting: Orthopedic Surgery

## 2018-12-15 ENCOUNTER — Ambulatory Visit: Payer: BLUE CROSS/BLUE SHIELD | Admitting: Orthopedic Surgery

## 2018-12-15 ENCOUNTER — Ambulatory Visit: Payer: BLUE CROSS/BLUE SHIELD | Admitting: Family Medicine

## 2018-12-15 VITALS — BP 123/75 | HR 89 | Ht 61.0 in | Wt 107.0 lb

## 2018-12-15 DIAGNOSIS — M7541 Impingement syndrome of right shoulder: Secondary | ICD-10-CM

## 2018-12-15 MED ORDER — PREDNISONE 10 MG (48) PO TBPK: ORAL_TABLET | Freq: Every day | ORAL | 0 refills | Status: DC

## 2018-12-15 NOTE — Progress Notes (Signed)
Progress Note   Patient ID: DEVONNE KITCHEN, female   DOB: 1966-07-26, 53 y.o.   MRN: [id number]   Chief Complaint  Patient presents with  . Shoulder Pain    right     HPI The patient presents for evaluation of pain right shoulder  She is 53 years old she had a history of adhesive capsulitis of both shoulders most recently the right shoulder but comes in today complaining of anterolateral right shoulder pain of several weeks duration dull aching severe associated with painful forward elevation   Review of Systems  Constitutional: Negative for chills and fever.  Neurological: Negative for tingling.   Current Meds  Medication Sig  . ALPRAZolam (XANAX) 0.5 MG tablet TAKE 1/2-1 TABLET BY MOUTH AT BEDTIME AS NEEDED.    Past Medical History:  Diagnosis Date  . Constipation   . Gastric adenoma 02/2017  . PONV (postoperative nausea and vomiting)      Allergies  Allergen Reactions  . Azithromycin     Patient gets worst whenever she takes a Z-Pak  . Levaquin [Levofloxacin In D5w]     Pt states she gets worst.      BP 123/75   Pulse 89   Ht 5\' 1"  (1.549 m)   Wt 107 lb (48.5 kg)   BMI 20.22 kg/m    Physical Exam General appearance normal Oriented x3 normal Mood pleasant affect normal   Ortho Exam Left shoulder Inspection and palpation revealed no abnormalities Range of motion is full No instability was detected on stress testing Muscle tone and strength was normal without tremor Skin was warm dry and intact Good pulse and temperature were noted in the extremity Sensation revealed no abnormalities to light touch  Right shoulder is tender in the anterolateral portion rotator interval biceps tendon and acromion passive range of motion is full active range of motion is near full except for internal rotation which is probably related to prior adhesive capsulitis muscle tone and strength are excellent skin is warm dry intact she has good pulses normal temperature of the  extremity with normal sensation to light touch   MEDICAL DECISION MAKING   Imaging:  none Prior x-ray shows greater tuberosity sclerosis without glenohumeral arthritis  Encounter Diagnosis  Name Primary?  . Shoulder impingement, right Yes    Procedure note the subacromial injection shoulder RIGHT  Verbal consent was obtained to inject the  RIGHT   Shoulder  Timeout was completed to confirm the injection site is a subacromial space of the  RIGHT  shoulder   Medication used Depo-Medrol 40 mg and lidocaine 1% 3 cc  Anesthesia was provided by ethyl chloride  The injection was performed in the RIGHT  posterior subacromial space. After pinning the skin with alcohol and anesthetized the skin with ethyl chloride the subacromial space was injected using a 20-gauge needle. There were no complications  Sterile dressing was applied.    PLAN: (RX., injection, surgery,frx,mri/ct, XR 2 body ares) Subacromial injection Prednisone Exercises Follow-up as needed  Meds ordered this encounter  Medications  . predniSONE (STERAPRED UNI-PAK 48 TAB) 10 MG (48) TBPK tablet    Sig: Take by mouth daily. 10 mg ds 12 days as directed    Dispense:  48 tablet    Refill:  0   5:04 PM 12/15/2018

## 2018-12-15 NOTE — Patient Instructions (Signed)
Home exercises  Start prednisone

## 2018-12-23 ENCOUNTER — Ambulatory Visit: Payer: BLUE CROSS/BLUE SHIELD | Admitting: Family Medicine

## 2018-12-23 ENCOUNTER — Encounter: Payer: Self-pay | Admitting: Family Medicine

## 2018-12-23 VITALS — BP 128/88 | Temp 98.6°F | Ht 61.0 in | Wt 105.0 lb

## 2018-12-23 DIAGNOSIS — Z87448 Personal history of other diseases of urinary system: Secondary | ICD-10-CM

## 2018-12-23 LAB — POCT URINALYSIS DIPSTICK
Blood, UA: NEGATIVE
Leukocytes, UA: NEGATIVE
Spec Grav, UA: <=1.005 — AB (ref 1.010–1.025)
pH, UA: 7 (ref 5.0–8.0)

## 2018-12-23 NOTE — Progress Notes (Signed)
   Subjective:    Patient ID: Carla Wise, female    DOB: 1966-09-08, 53 y.o.   MRN: [id number]  HPI  Patient is here today to follow up on hematuria. She is asymptomatic with no burning,stinging or abdominal pressure or pain with urinating. She has seen no sight of blood either. Hx of ablation.  Finished amoxicillin, Denies any symptoms.   No other concerns today.  Review of Systems  Constitutional: Negative for fever and unexpected weight change.  Gastrointestinal: Negative for abdominal pain.  Genitourinary: Negative for dysuria, flank pain, frequency, hematuria, pelvic pain, vaginal bleeding and vaginal discharge.        Objective:   Physical Exam Vitals signs and nursing note reviewed.  Constitutional:      General: She is not in acute distress.    Appearance: She is well-developed.  HENT:     Head: Normocephalic and atraumatic.  Neck:     Musculoskeletal: Neck supple.  Cardiovascular:     Rate and Rhythm: Normal rate and regular rhythm.     Heart sounds: Normal heart sounds. No murmur.  Pulmonary:     Effort: Pulmonary effort is normal. No respiratory distress.     Breath sounds: Normal breath sounds.  Skin:    General: Skin is warm and dry.  Neurological:     Mental Status: She is alert and oriented to person, place, and time.    Results for orders placed or performed in visit on 12/23/18  POCT urinalysis dipstick  Result Value Ref Range   Color, UA     Clarity, UA     Glucose, UA     Bilirubin, UA     Ketones, UA     Spec Grav, UA <=1.005 (A) 1.010 - 1.025   Blood, UA Negative    pH, UA 7.0 5.0 - 8.0   Protein, UA     Urobilinogen, UA     Nitrite, UA     Leukocytes, UA Negative Negative   Appearance     Odor     Microscopic exam of urine no evidence of WBCs or RBCs. Rare epithelial cell noted.        Assessment & Plan:  Hx of hematuria - Plan: POCT urinalysis dipstick  Pt with hx of noticing bleeding with wiping along with UTI symptoms.  Pt treated for UTI and symptoms resolved. No blood was noted in urine at that visit or this f/u visit. Discussed with patient would like her to follow up in 3-6 weeks for repeat urine specimen, we can do wellness physical at that time as well. Pt should notify us if she has any further bleeding prior to this appt. She verbalized understanding.  Dr. Sallee Lange was consulted on this case and is in agreement with the above treatment plan.

## 2018-12-30 ENCOUNTER — Ambulatory Visit: Payer: BLUE CROSS/BLUE SHIELD | Admitting: Family Medicine

## 2018-12-30 VITALS — BP 112/82 | Ht 61.0 in | Wt 106.0 lb

## 2018-12-30 DIAGNOSIS — K219 Gastro-esophageal reflux disease without esophagitis: Secondary | ICD-10-CM | POA: Diagnosis not present

## 2018-12-30 MED ORDER — SUCRALFATE 1 GM/10ML PO SUSP: 1.0000 g | Freq: Three times a day (TID) | ORAL | 1 refills | Status: DC

## 2018-12-30 MED ORDER — PANTOPRAZOLE SODIUM 40 MG PO TBEC: 40.0000 mg | DELAYED_RELEASE_TABLET | Freq: Every day | ORAL | 2 refills | Status: DC

## 2018-12-30 NOTE — Progress Notes (Signed)
   Subjective:    Patient ID: Carla Wise, female    DOB: 1966/10/25, 53 y.o.   MRN: [id number]  HPI Patient  Arrives with indigestion -worse for 2 weeks. Patient has tried OTC pepcid but it has not helped. The patient states she doesn't even feel like eating and she is miserable.  Reports burning to mid chest down to epigastric area intermittent for the last 2 weeks, worse the last 6 days. No N/V. Decreased appetite because of it. Reports pain is constant now, waking up at night with this. Not affected by certain foods. Avoids fried and spicy foods. No shortness of breath. No diarrhea or constipation. No fever or abdominal pain. Tums and pepcid not helpful. Denies chest pressure or shortness of breath.  Thinks many years ago had issues with reflux.    Review of Systems  Constitutional: Positive for appetite change. Negative for fever.  Respiratory: Negative for shortness of breath.   Cardiovascular: Negative for chest pain.  Gastrointestinal: Negative for abdominal pain, blood in stool, constipation, diarrhea, nausea and vomiting.       Reflux symptoms, see HPI       Objective:   Physical Exam Vitals signs and nursing note reviewed.  Constitutional:      General: She is not in acute distress.    Appearance: Normal appearance. She is not toxic-appearing.  HENT:     Head: Normocephalic and atraumatic.  Neck:     Musculoskeletal: Neck supple.  Cardiovascular:     Rate and Rhythm: Normal rate and regular rhythm.     Heart sounds: Normal heart sounds.  Pulmonary:     Effort: Pulmonary effort is normal. No respiratory distress.     Breath sounds: Normal breath sounds.  Abdominal:     General: Abdomen is flat. Bowel sounds are normal. There is no distension.     Palpations: Abdomen is soft. There is no mass.     Tenderness: There is no abdominal tenderness. There is no guarding.  Lymphadenopathy:     Cervical: No cervical adenopathy.  Skin:    General: Skin is warm and dry.    Neurological:     Mental Status: She is alert and oriented to person, place, and time.           Assessment & Plan:  Gastroesophageal reflux disease without esophagitis - Plan: Ambulatory referral to Gastroenterology  Likely GERD, will treat with carafate and protonix. Recommend urgent referral to GI, pt prefers Dr. Oneida Alar, for further evaluation and treatment, likely needs repeat endoscopy. Pt with hx of gastric adenoma in 2018, was supposed to have f/u endoscopy 1 year later and needed a colonoscopy done, pt has not followed up on these. Will f/u with [place] in 3 weeks to ensure symptoms have improved, if not may need to consider additional testing. Do not feel her symptoms are cardiac in nature. Will also repeat her U/A to ensure no hematuria on f/u.   Dr. Sallee Lange was consulted on this case and is in agreement with the above treatment plan.

## 2018-12-31 ENCOUNTER — Telehealth: Payer: Self-pay | Admitting: Family Medicine

## 2018-12-31 ENCOUNTER — Ambulatory Visit: Payer: BLUE CROSS/BLUE SHIELD | Admitting: Orthopedic Surgery

## 2018-12-31 NOTE — Telephone Encounter (Signed)
Which medication? Thanks

## 2018-12-31 NOTE — Telephone Encounter (Signed)
Fax from pharmacy stating that liquid is not covered. Can change to tablet for patient to make slurry? PA will not cover liquid due to pills being cheaper. Please advise. Thank you.

## 2019-01-01 ENCOUNTER — Other Ambulatory Visit: Payer: Self-pay | Admitting: Family Medicine

## 2019-01-01 ENCOUNTER — Encounter: Payer: Self-pay | Admitting: Gastroenterology

## 2019-01-01 MED ORDER — SUCRALFATE 1 G PO TABS: 1.0000 g | ORAL_TABLET | Freq: Three times a day (TID) | ORAL | 1 refills | Status: DC

## 2019-01-01 NOTE — Telephone Encounter (Signed)
Please let pt know the pill form was sent into her pharmacy, they should instruct her on how to make it into a slurry. Thank you

## 2019-01-01 NOTE — Telephone Encounter (Signed)
Informed patient that tablet form was sent into [facility]. Pt verbalized understanding.

## 2019-01-01 NOTE — Telephone Encounter (Signed)
I apologize. Its the sulcrafate suspension.

## 2019-01-05 ENCOUNTER — Other Ambulatory Visit: Payer: Self-pay | Admitting: *Deleted

## 2019-01-05 ENCOUNTER — Telehealth: Payer: Self-pay | Admitting: *Deleted

## 2019-01-05 MED ORDER — OSELTAMIVIR PHOSPHATE 75 MG PO CAPS: 75.0000 mg | ORAL_CAPSULE | Freq: Two times a day (BID) | ORAL | 0 refills | Status: DC

## 2019-01-05 NOTE — Telephone Encounter (Signed)
Patient with flulike illness started yesterday Husband being seen today with similar illness diagnosed with the flu started on Tamiflu Husband requested prescription Tamiflu for his wife This was sent in with the recommendation that if the patient starts getting worse or having other complicating matters that she will be seen right away He agrees to this

## 2019-01-05 NOTE — Telephone Encounter (Signed)
Husband calling to check on status of pt's referral for throat. I checked the note under referral tab and the referral was sent to Gi. Their office will contact her and number for pt to call to check on status is [phone]. I called pt and gave her the number to dr Nona Dell office to check on status of referral. Pt states she will call

## 2019-01-05 NOTE — Telephone Encounter (Signed)
Pt having flu like symptoms. Husband in office today and diagnosed with flu. tamiflu 75mg  one bid for 5 days sent to pharm.

## 2019-01-20 ENCOUNTER — Ambulatory Visit: Payer: BLUE CROSS/BLUE SHIELD | Admitting: Family Medicine

## 2019-01-20 ENCOUNTER — Encounter: Payer: Self-pay | Admitting: Family Medicine

## 2019-01-20 VITALS — BP 122/70 | Temp 97.9°F | Ht 61.0 in | Wt 106.0 lb

## 2019-01-20 DIAGNOSIS — Z87448 Personal history of other diseases of urinary system: Secondary | ICD-10-CM | POA: Diagnosis not present

## 2019-01-20 DIAGNOSIS — K219 Gastro-esophageal reflux disease without esophagitis: Secondary | ICD-10-CM

## 2019-01-20 LAB — POCT URINALYSIS DIPSTICK
Blood, UA: NEGATIVE
Spec Grav, UA: 1.02 (ref 1.010–1.025)
pH, UA: 5 (ref 5.0–8.0)

## 2019-01-20 NOTE — Progress Notes (Signed)
Subjective:     Patient ID: Carla Wise, female   DOB: Oct 14, 1966, 53 y.o.   MRN: [id number]  HPI Patient is here today to follow up her last office visit from 12/30/2018 to make sure no hematuria. Hx of blood noted on toilet tissue when wiping during UTI however no blood noted in urine specimen. Denies any urinary symptoms.   She is also following up on her GERD, she is taking Protonix 40 mg once per day and carafate 1 g po four times daily with meals and qhs. States pain has eased up but has come back but not as bad over the last 3 days.  States does not have appt for GI until 03/06/19  Review of Systems  Constitutional: Negative for chills and fever.  HENT: Negative for trouble swallowing.   Respiratory: Negative for cough and shortness of breath.   Cardiovascular: Negative for chest pain.  Gastrointestinal: Negative for abdominal pain, blood in stool, constipation, diarrhea, nausea and vomiting.  Genitourinary: Negative for dysuria, hematuria and vaginal bleeding.       Objective:   Physical Exam Vitals signs and nursing note reviewed.  Constitutional:      General: She is not in acute distress.    Appearance: Normal appearance. She is not toxic-appearing.  HENT:     Head: Normocephalic and atraumatic.  Neck:     Musculoskeletal: Neck supple.  Cardiovascular:     Rate and Rhythm: Normal rate and regular rhythm.     Heart sounds: Normal heart sounds.  Pulmonary:     Effort: Pulmonary effort is normal. No respiratory distress.     Breath sounds: Normal breath sounds. No wheezing or rales.  Abdominal:     General: Abdomen is flat. Bowel sounds are normal. There is no distension.     Palpations: Abdomen is soft. There is no mass.     Tenderness: There is no abdominal tenderness.  Skin:    General: Skin is warm and dry.  Neurological:     Mental Status: She is alert and oriented to person, place, and time.    Results for orders placed or performed in visit on 01/20/19   POCT urinalysis dipstick  Result Value Ref Range   Color, UA     Clarity, UA     Glucose, UA     Bilirubin, UA     Ketones, UA     Spec Grav, UA 1.020 1.010 - 1.025   Blood, UA Negative    pH, UA 5.0 5.0 - 8.0   Protein, UA     Urobilinogen, UA     Nitrite, UA     Leukocytes, UA Trace (A) Negative   Appearance     Odor     Microscopic exam of urine: Rare epithelial cell, Rare WBC. No RBCs noted.     Assessment:     Gastroesophageal reflux disease without esophagitis  Hx of hematuria - Plan: POCT urinalysis dipstick      Plan:     1. Reflux is under better control today with daily use of protonix and carafate. Pt is requesting to use carafate prn, feel this is reasonable. Unfortunately not able to get in to see GI in [place] until end of March. Offered referral to GI in [place] as potentially they could see her sooner but patient declined. She states she will call GI office tomorrow and see if she can be called when there are any cancellations to be seen sooner. Recommend continuing medications until  she sees them. Based on their office noted from 06/20/17 also needs a repeat EGD for surveillance due to gastric polyp (this was due in June 2019) and initial screening Colonoscopy, so should get this scheduled with them when she sees them. Warning signs discussed, f/u if symptoms worsen before seeing GI.   2. Pt has not noted any further bleeding when wiping, no blood in urine or vaginally. Urine today clear on exam, do not feel this needs to be repeated again unless she develops further symptoms. Discussed important for her to f/u if she notices blood again. She verbalized understanding.

## 2019-01-28 ENCOUNTER — Other Ambulatory Visit: Payer: Self-pay

## 2019-01-28 ENCOUNTER — Ambulatory Visit: Payer: BLUE CROSS/BLUE SHIELD | Admitting: Gastroenterology

## 2019-01-28 ENCOUNTER — Encounter: Payer: Self-pay | Admitting: Gastroenterology

## 2019-01-28 VITALS — BP 110/69 | HR 101 | Temp 97.9°F | Ht 62.0 in | Wt 107.2 lb

## 2019-01-28 DIAGNOSIS — Z8719 Personal history of other diseases of the digestive system: Secondary | ICD-10-CM | POA: Insufficient documentation

## 2019-01-28 DIAGNOSIS — K219 Gastro-esophageal reflux disease without esophagitis: Secondary | ICD-10-CM | POA: Diagnosis not present

## 2019-01-28 MED ORDER — DEXLANSOPRAZOLE 60 MG PO CPDR: 60.0000 mg | DELAYED_RELEASE_CAPSULE | Freq: Every day | ORAL | 5 refills | Status: DC

## 2019-01-28 NOTE — Assessment & Plan Note (Signed)
Pleasant 53 year old female presenting with refractory heartburn type symptoms.  Initially some relief with pantoprazole but now she has recurrent symptoms.  Denies any other associated symptoms.  She has heartburn throughout the day regardless of meals.  Denies any medication changes, dietary changes.  She also has a history of gastric polyp removed at time of EGD in March 2018, tubular adenoma.  We had recommended surveillance today in March 2019 the patient was lost to follow-up.  Plan for EGD in the near future.  Patient states she typically has issues with vomiting after anesthesia.  Will provide preoperative Phenergan 12.5 mg IV.  Consider Zofran as well during procedure if felt appropriate.  I have discussed the risks, alternatives, benefits with regards to but not limited to the risk of reaction to medication, bleeding, infection, perforation and the patient is agreeable to proceed. Written consent to be obtained.  Stop pantoprazole.  Start Dexilant 60 mg 30 minutes before breakfast daily.  She may take a dose today.  Samples provided.  Prescription sent to pharmacy and patient provided rebate card.  Reinforced antireflux measures.  Patient is currently not interested in pursuing screening colonoscopy.  She can call anytime she is ready.  If she decides to forego colonoscopy, would recommend minimal of Cologuard testing.

## 2019-01-28 NOTE — Patient Instructions (Addendum)
1. Stop pantoprazole. Start Dexilant once daily before breakfast.  2. Upper endoscopy as scheduled. See separate instructions.  3. Whenever you are ready to have a colonoscopy please let us know. If you decide against a colonoscopy, the Cologuard is the next best test to screen for colon cancer. It is done by collecting a stool specimen which is then checked for blood and DNA linked to colon polyps/cancer.    Gastroesophageal Reflux Disease, Adult Gastroesophageal reflux (GER) happens when acid from the stomach flows up into the tube that connects the mouth and the stomach (esophagus). Normally, food travels down the esophagus and stays in the stomach to be digested. However, when a person has GER, food and stomach acid sometimes move back up into the esophagus. If this becomes a more serious problem, the person may be diagnosed with a disease called gastroesophageal reflux disease (GERD). GERD occurs when the reflux:  Happens often.  Causes frequent or severe symptoms.  Causes problems such as damage to the esophagus. When stomach acid comes in contact with the esophagus, the acid may cause soreness (inflammation) in the esophagus. Over time, GERD may create small holes (ulcers) in the lining of the esophagus. What are the causes? This condition is caused by a problem with the muscle between the esophagus and the stomach (lower esophageal sphincter, or LES). Normally, the LES muscle closes after food passes through the esophagus to the stomach. When the LES is weakened or abnormal, it does not close properly, and that allows food and stomach acid to go back up into the esophagus. The LES can be weakened by certain dietary substances, medicines, and medical conditions, including:  Tobacco use.  Pregnancy.  Having a hiatal hernia.  Alcohol use.  Certain foods and beverages, such as coffee, chocolate, onions, and peppermint. What increases the risk? You are more likely to develop this  condition if you:  Have an increased body weight.  Have a connective tissue disorder.  Use NSAID medicines. What are the signs or symptoms? Symptoms of this condition include:  Heartburn.  Difficult or painful swallowing.  The feeling of having a lump in the throat.  Abitter taste in the mouth.  Bad breath.  Having a large amount of saliva.  Having an upset or bloated stomach.  Belching.  Chest pain. Different conditions can cause chest pain. Make sure you see your health care provider if you experience chest pain.  Shortness of breath or wheezing.  Ongoing (chronic) cough or a night-time cough.  Wearing away of tooth enamel.  Weight loss. How is this diagnosed? Your health care provider will take a medical history and perform a physical exam. To determine if you have mild or severe GERD, your health care provider may also monitor how you respond to treatment. You may also have tests, including:  A test to examine your stomach and esophagus with a small camera (endoscopy).  A test thatmeasures the acidity level in your esophagus.  A test thatmeasures how much pressure is on your esophagus.  A barium swallow or modified barium swallow test to show the shape, size, and functioning of your esophagus. How is this treated? The goal of treatment is to help relieve your symptoms and to prevent complications. Treatment for this condition may vary depending on how severe your symptoms are. Your health care provider may recommend:  Changes to your diet.  Medicine.  Surgery. Follow these instructions at home: Eating and drinking   Follow a diet as recommended by your  health care provider. This may involve avoiding foods and drinks such as: ? Coffee and tea (with or without caffeine). ? Drinks that containalcohol. ? Energy drinks and sports drinks. ? Carbonated drinks or sodas. ? Chocolate and cocoa. ? Peppermint and mint flavorings. ? Garlic and  onions. ? Horseradish. ? Spicy and acidic foods, including peppers, chili powder, curry powder, vinegar, hot sauces, and barbecue sauce. ? Citrus fruit juices and citrus fruits, such as oranges, lemons, and limes. ? Tomato-based foods, such as red sauce, chili, salsa, and pizza with red sauce. ? Fried and fatty foods, such as donuts, french fries, potato chips, and high-fat dressings. ? High-fat meats, such as hot dogs and fatty cuts of red and white meats, such as rib eye steak, sausage, ham, and bacon. ? High-fat dairy items, such as whole milk, butter, and cream cheese.  Eat small, frequent meals instead of large meals.  Avoid drinking large amounts of liquid with your meals.  Avoid eating meals during the 2-3 hours before bedtime.  Avoid lying down right after you eat.  Do not exercise right after you eat. Lifestyle   Do not use any products that contain nicotine or tobacco, such as cigarettes, e-cigarettes, and chewing tobacco. If you need help quitting, ask your health care provider.  Try to reduce your stress by using methods such as yoga or meditation. If you need help reducing stress, ask your health care provider.  If you are overweight, reduce your weight to an amount that is healthy for you. Ask your health care provider for guidance about a safe weight loss goal. General instructions  Pay attention to any changes in your symptoms.  Take over-the-counter and prescription medicines only as told by your health care provider. Do not take aspirin, ibuprofen, or other NSAIDs unless your health care provider told you to do so.  Wear loose-fitting clothing. Do not wear anything tight around your waist that causes pressure on your abdomen.  Raise (elevate) the head of your bed about 6 inches (15 cm).  Avoid bending over if this makes your symptoms worse.  Keep all follow-up visits as told by your health care provider. This is important. Contact a health care provider  if:  You have: ? New symptoms. ? Unexplained weight loss. ? Difficulty swallowing or it hurts to swallow. ? Wheezing or a persistent cough. ? A hoarse voice.  Your symptoms do not improve with treatment. Get help right away if you:  Have pain in your arms, neck, jaw, teeth, or back.  Feel sweaty, dizzy, or light-headed.  Have chest pain or shortness of breath.  Vomit and your vomit looks like blood or coffee grounds.  Faint.  Have stool that is bloody or black.  Cannot swallow, drink, or eat. Summary  Gastroesophageal reflux happens when acid from the stomach flows up into the esophagus. GERD is a disease in which the reflux happens often, causes frequent or severe symptoms, or causes problems such as damage to the esophagus.  Treatment for this condition may vary depending on how severe your symptoms are. Your health care provider may recommend diet and lifestyle changes, medicine, or surgery.  Contact a health care provider if you have new or worsening symptoms.  Take over-the-counter and prescription medicines only as told by your health care provider. Do not take aspirin, ibuprofen, or other NSAIDs unless your health care provider told you to do so.  Keep all follow-up visits as told by your health care provider. This is  important. This information is not intended to replace advice given to you by your health care provider. Make sure you discuss any questions you have with your health care provider. Document Released: 09/05/2005 Document Revised: 06/04/2018 Document Reviewed: 06/04/2018 Elsevier Interactive Patient Education  2019 [employer].

## 2019-01-28 NOTE — Progress Notes (Addendum)
REVIEWED-NO ADDITIONAL RECOMMENDATIONS.   Primary Care Physician: Kathyrn Drown, MD  Primary Gastroenterologist:  Barney Drain, MD   Chief Complaint  Patient presents with  . Gastroesophageal Reflux    HPI: Carla Wise is a 53 y.o. female here at the request of Burnett Kanaris, NP [facility] for follow-up of GERD.  She was last seen in July 2018.  She had an EGD in March 2018 for history of dysphagia with benign-appearing peptic stricture status post dilation on EGD.  She had a gastric polyp (tubular adenoma), gastritis, duodenal diverticulum.  At last visit she also complained on constipation, no prior colonoscopy, patient was waiting till she turned 72 due to insurance reasons. Advised to have one year surveillance EGD March 2019 for tubular adenoma removed from the stomach.  Patient was lost to follow-up.    She presents today with complaints of refractory heartburn which began in early January.  She tried over-the-counter Pepcid without relief.  Denied any change in diet or medication.  Denies NSAID or aspirin use.  Complaint of burning in the mid chest down into the epigastrium.  No nausea or vomiting.  Some decreased appetite.  Really no issues with dysphagia, previous esophageal dilation helped.  She was started on pantoprazole 40 mg daily.  At first it seemed to help.  Couple weeks later her heartburn started coming back.  PCP added Carafate which has not seemed to make any difference.  Denies any melena or rectal bleeding.  No constipation or diarrhea.  No weight loss.  She states the burning is occurring all the time irregardless of whether she eats.  No exertional component.    Patient was placed on omeprazole 20 mg daily after her EGD in 2018.  Cannot remember whether it helped her reflux then.  We discussed the fact that she is never had a colonoscopy.  She is not interested in pursuing at this time.  She wants to have her current symptoms addressed  first.  Current Outpatient Medications  Medication Sig Dispense Refill  . ALPRAZolam (XANAX) 0.5 MG tablet TAKE 1/2-1 TABLET BY MOUTH AT BEDTIME AS NEEDED. 30 tablet 5  . pantoprazole (PROTONIX) 40 MG tablet Take 1 tablet (40 mg total) by mouth daily. 30 tablet 2  . sucralfate (CARAFATE) 1 g tablet Take 1 tablet (1 g total) by mouth 4 (four) times daily -  with meals and at bedtime. 30 tablet 1   No current facility-administered medications for this visit.     Allergies as of 01/28/2019 - Review Complete 01/28/2019  Allergen Reaction Noted  . Azithromycin  11/23/2013  . Levaquin [levofloxacin in d5w]  02/04/2015   Past Medical History:  Diagnosis Date  . Constipation   . Gastric adenoma 02/2017  . PONV (postoperative nausea and vomiting)    Past Surgical History:  Procedure Laterality Date  . ESOPHAGOGASTRODUODENOSCOPY N/A 03/01/2017   Dr. Oneida Alar: benign-appearing peptic stricture s/p dilation, gastric polyp (tubular adenoma), gastritis, duodenal diverticulm  . EXAM UNDER ANESTHESIA WITH MANIPULATION OF SHOULDER Left 06/06/2017   Procedure: EXAM UNDER ANESTHESIA WITH MANIPULATION OF SHOULDER;  Surgeon: Carole Civil, MD;  Location: [facility];  Service: Orthopedics;  Laterality: Left;  full relaxation  . FOOT SURGERY Left 2003   hammer toe- great toe  . KNEE ARTHROSCOPY W/ MENISCECTOMY     left knee  . SAVORY DILATION N/A 03/01/2017   Procedure: SAVORY DILATION;  Surgeon: Danie Binder, MD;  Location: [facility] SUITE;  Service: Endoscopy;  Laterality: N/A;   Family History  Problem Relation Age of Onset  . Heart disease Other   . Cancer Other   . Colon cancer Neg Hx    Social History   Tobacco Use  . Smoking status: Current Every Day Smoker    Packs/day: 0.50    Years: 25.00    Pack years: 12.50    Types: Cigarettes  . Smokeless tobacco: Never Used  Substance Use Topics  . Alcohol use: No  . Drug use: No    ROS:  General: Negative for anorexia, weight loss,  fever, chills, fatigue, weakness. ENT: Negative for hoarseness, difficulty swallowing , nasal congestion. CV: Negative for chest pain, angina, palpitations, dyspnea on exertion, peripheral edema.  Respiratory: Negative for dyspnea at rest, dyspnea on exertion, cough, sputum, wheezing.  GI: See history of present illness. GU:  Negative for dysuria, hematuria, urinary incontinence, urinary frequency, nocturnal urination.  Endo: Negative for unusual weight change.    Physical Examination:   BP 110/69   Pulse (!) 101   Temp 97.9 F (36.6 C) (Oral)   Ht 5\' 2"  (1.575 m)   Wt 107 lb 3.2 oz (48.6 kg)   BMI 19.61 kg/m   General: Well-nourished, well-developed in no acute distress.  Eyes: No icterus. Mouth: Oropharyngeal mucosa moist and pink , no lesions erythema or exudate. Lungs: Clear to auscultation bilaterally.  Heart: Regular rate and rhythm, no murmurs rubs or gallops.  Abdomen: Bowel sounds are normal, nontender, nondistended, no hepatosplenomegaly or masses, no abdominal bruits or hernia , no rebound or guarding.   Extremities: No lower extremity edema. No clubbing or deformities. Neuro: Alert and oriented x 4   Skin: Warm and dry, no jaundice.   Psych: Alert and cooperative, normal mood and affect.

## 2019-01-29 NOTE — Progress Notes (Signed)
cc'd to pcp 

## 2019-01-30 ENCOUNTER — Ambulatory Visit (HOSPITAL_COMMUNITY)
Admission: RE | Admit: 2019-01-30 | Discharge: 2019-01-30 | Disposition: A | Discharge status: Home / Self Care | Payer: BLUE CROSS/BLUE SHIELD | Source: Ambulatory Visit | Attending: Gastroenterology | Admitting: Gastroenterology

## 2019-01-30 ENCOUNTER — Other Ambulatory Visit: Payer: Self-pay

## 2019-01-30 ENCOUNTER — Encounter (HOSPITAL_COMMUNITY)
Admission: RE | Disposition: A | Discharge status: Home / Self Care | Payer: Self-pay | Source: Ambulatory Visit | Attending: Gastroenterology

## 2019-01-30 ENCOUNTER — Encounter (HOSPITAL_COMMUNITY): Payer: Self-pay | Admitting: *Deleted

## 2019-01-30 DIAGNOSIS — Z79899 Other long term (current) drug therapy: Secondary | ICD-10-CM | POA: Diagnosis not present

## 2019-01-30 DIAGNOSIS — K2970 Gastritis, unspecified, without bleeding: Secondary | ICD-10-CM

## 2019-01-30 DIAGNOSIS — K2980 Duodenitis without bleeding: Secondary | ICD-10-CM | POA: Insufficient documentation

## 2019-01-30 DIAGNOSIS — K317 Polyp of stomach and duodenum: Secondary | ICD-10-CM

## 2019-01-30 DIAGNOSIS — R1013 Epigastric pain: Secondary | ICD-10-CM | POA: Diagnosis not present

## 2019-01-30 DIAGNOSIS — K222 Esophageal obstruction: Secondary | ICD-10-CM

## 2019-01-30 DIAGNOSIS — Z8719 Personal history of other diseases of the digestive system: Secondary | ICD-10-CM

## 2019-01-30 DIAGNOSIS — K219 Gastro-esophageal reflux disease without esophagitis: Secondary | ICD-10-CM

## 2019-01-30 HISTORY — PX: ESOPHAGOGASTRODUODENOSCOPY: SHX5428

## 2019-01-30 HISTORY — PX: BIOPSY: SHX5522

## 2019-01-30 SURGERY — EGD (ESOPHAGOGASTRODUODENOSCOPY)
Anesthesia: Moderate Sedation

## 2019-01-30 MED ORDER — MIDAZOLAM HCL 5 MG/5ML IJ SOLN
INTRAMUSCULAR | Status: AC
  Filled 2019-01-30: qty 10

## 2019-01-30 MED ORDER — STERILE WATER FOR IRRIGATION IR SOLN
Status: DC | PRN
  Administered 2019-01-30: 09:00:00

## 2019-01-30 MED ORDER — LIDOCAINE VISCOUS HCL 2 % MT SOLN
OROMUCOSAL | Status: AC
  Filled 2019-01-30: qty 15

## 2019-01-30 MED ORDER — MIDAZOLAM HCL 5 MG/5ML IJ SOLN
INTRAMUSCULAR | Status: DC | PRN
  Administered 2019-01-30 (×2): 2 mg via INTRAVENOUS

## 2019-01-30 MED ORDER — PROMETHAZINE HCL 25 MG/ML IJ SOLN
INTRAMUSCULAR | Status: AC
  Filled 2019-01-30: qty 1

## 2019-01-30 MED ORDER — SODIUM CHLORIDE 0.9 % IV SOLN
INTRAVENOUS | Status: DC
  Administered 2019-01-30: 1000 mL via INTRAVENOUS

## 2019-01-30 MED ORDER — PROMETHAZINE HCL 25 MG/ML IJ SOLN
12.5000 mg | Freq: Once | INTRAMUSCULAR | Status: AC
  Administered 2019-01-30: 12.5 mg via INTRAVENOUS

## 2019-01-30 MED ORDER — SODIUM CHLORIDE 0.9% FLUSH
INTRAVENOUS | Status: AC
  Filled 2019-01-30: qty 10

## 2019-01-30 MED ORDER — MEPERIDINE HCL 100 MG/ML IJ SOLN
INTRAMUSCULAR | Status: AC
  Filled 2019-01-30: qty 2

## 2019-01-30 MED ORDER — LIDOCAINE VISCOUS HCL 2 % MT SOLN
OROMUCOSAL | Status: DC | PRN
  Administered 2019-01-30: 1 via OROMUCOSAL

## 2019-01-30 MED ORDER — MEPERIDINE HCL 100 MG/ML IJ SOLN
INTRAMUSCULAR | Status: DC | PRN
  Administered 2019-01-30: 25 mg

## 2019-01-30 NOTE — Op Note (Signed)
[facility] Patient Name: Carla Wise Procedure Date: 01/30/2019 8:56 AM MRN: [id number] Date of Birth: 10/24/1966 Attending MD: Barney Drain MD, MD CSN: [id number] Age: 53 Admit Type: Outpatient Procedure:                Upper GI endoscopy WITH COLD FORCEPS BIOPSY Indications:              Dyspepsia, Heartburn Providers:                Barney Drain MD, MD, Janeece Riggers, RN, Nelma Rothman,                            Technician Referring MD:             Elayne Snare. Luking Medicines:                Promethazine 12.5 mg IV, Meperidine 25 mg IV,                            Midazolam 4 mg IV Complications:            No immediate complications. Estimated Blood Loss:     Estimated blood loss was minimal. Procedure:                Pre-Anesthesia Assessment:                           - Prior to the procedure, a History and Physical                            was performed, and patient medications and                            allergies were reviewed. The patient's tolerance of                            previous anesthesia was also reviewed. The risks                            and benefits of the procedure and the sedation                            options and risks were discussed with the patient.                            All questions were answered, and informed consent                            was obtained. Prior Anticoagulants: The patient has                            taken no previous anticoagulant or antiplatelet                            agents. ASA Grade Assessment: II - A patient with  mild systemic disease. After reviewing the risks                            and benefits, the patient was deemed in                            satisfactory condition to undergo the procedure.                            After obtaining informed consent, the endoscope was                            passed under direct vision. Throughout the   procedure, the patient's blood pressure, pulse, and                            oxygen saturations were monitored continuously. The                            [id number]-H190 ([id number]) scope was introduced through the                            mouth, and advanced to the second part of duodenum.                            The upper GI endoscopy was accomplished without                            difficulty. The patient tolerated the procedure                            well. Scope In: 8:57:18 AM Scope Out: 9:06:37 AM Total Procedure Duration: 0 hours 9 minutes 19 seconds  Findings:      One benign-appearing, intrinsic mild (non-circumferential scarring)       stenosis was found. This stenosis measured 1.5 cm (inner diameter). The       stenosis was traversed.      A single 4 mm sessile polyp with no stigmata of recent bleeding was       found in the gastric body. The polyp was removed with a cold biopsy       forceps. Resection and retrieval were complete.      Diffuse mild inflammation characterized by congestion (edema) and       erythema was found on the greater curvature of the stomach and in the       gastric antrum. Biopsies were taken with a cold forceps for histology.      The examined duodenum was normal. Biopsies were taken with a cold       forceps for histology. Impression:               - Benign-appearing esophageal stenosis.                           - A single gastric polyp. Resected and retrieved.                           -  MILD Gastritis. Biopsied. Moderate Sedation:      Moderate (conscious) sedation was administered by the endoscopy nurse       and supervised by the endoscopist. The following parameters were       monitored: oxygen saturation, heart rate, blood pressure, and response       to care. Total physician intraservice time was 18 minutes. Recommendation:           - Patient has a contact number available for                            emergencies. The signs and  symptoms of potential                            delayed complications were discussed with the                            patient. Return to normal activities tomorrow.                            Written discharge instructions were provided to the                            patient.                           - Low fat diet. AVOID REFLUX TRIGGERS. CUT DOWN ON                            NICOTINE.                           - Continue present medications. DEXILANT DAILY OR                            ANOTHER PPI FOREVER.                           - Await pathology results.                           - Return to my office in 4 months. Procedure Code(s):        --- Professional ---                           [phone], Esophagogastroduodenoscopy, flexible,                            transoral; with biopsy, single or multiple                           G0500, Moderate sedation services provided by the                            same physician or other qualified health care  professional performing a gastrointestinal                            endoscopic service that sedation supports,                            requiring the presence of an independent trained                            observer to assist in the monitoring of the                            patient's level of consciousness and physiological                            status; initial 15 minutes of intra-service time;                            patient age 41 years or older (additional time may                            be reported with [phone], as appropriate) Diagnosis Code(s):        --- Professional ---                           K22.2, Esophageal obstruction                           K31.7, Polyp of stomach and duodenum                           K29.70, Gastritis, unspecified, without bleeding                           R10.13, Epigastric pain                           R12, Heartburn CPT copyright 2018 American  Medical Association. All rights reserved. The codes documented in this report are preliminary and upon coder review may  be revised to meet current compliance requirements. Barney Drain, MD Barney Drain MD, MD 01/30/2019 9:27:46 AM This report has been signed electronically. Number of Addenda: 0

## 2019-01-30 NOTE — Discharge Instructions (Signed)
You have mild gastritis and a partial esophageal stricture due to uncontrolled reflux. I removed one SMALL STOMACH POLYP. YOUR SMALL BOWEL LOOKED NORMAL. I biopsied your stomach and small bowel.    DRINK WATER TO KEEP YOUR URINE LIGHT YELLOW.  CUT DOWN ON YOUR SMOKING ESPECIALLY WHEN YOUR REFLUX IS NOT CONTROLLED.  AVOID TRIGGERS FOR REFLUX. SEE INFO BELOW.  FOLLOW A LOW FAT DIET. MEATS SHOULD BE BAKED, BROILED, OR BOILED. AVOID FRIED FOODS.    START DEXILANT DAILY. YOU NEED TO BE ON AN ACID BLOCKER FOREVER.  YOUR BIOPSY RESULTS WILL BE BACK IN 5 BUSINESS DAYS.  FOLLOW UP IN 4 MOS.  UPPER ENDOSCOPY AFTER CARE Read the instructions outlined below and refer to this sheet in the next week. These discharge instructions provide you with general information on caring for yourself after you leave the hospital. While your treatment has been planned according to the most current medical practices available, unavoidable complications occasionally occur. If you have any problems or questions after discharge, call DR. Annlouise Gerety, [phone].  ACTIVITY  You may resume your regular activity, but move at a slower pace for the next 24 hours.   Take frequent rest periods for the next 24 hours.   Walking will help get rid of the air and reduce the bloated feeling in your belly (abdomen).   No driving for 24 hours (because of the medicine (anesthesia) used during the test).   You may shower.   Do not sign any important legal documents or operate any machinery for 24 hours (because of the anesthesia used during the test).    NUTRITION  Drink plenty of fluids.   You may resume your normal diet as instructed by your doctor.   Begin with a light meal and progress to your normal diet. Heavy or fried foods are harder to digest and may make you feel sick to your stomach (nauseated).   Avoid alcoholic beverages for 24 hours or as instructed.    MEDICATIONS  You may resume your normal  medications.   WHAT YOU CAN EXPECT TODAY  Some feelings of bloating in the abdomen.   Passage of more gas than usual.    IF YOU HAD A BIOPSY TAKEN DURING THE UPPER ENDOSCOPY:  Eat a soft diet IF YOU HAVE NAUSEA, BLOATING, ABDOMINAL PAIN, OR VOMITING.    FINDING OUT THE RESULTS OF YOUR TEST Not all test results are available during your visit. DR. Oneida Alar WILL CALL YOU WITHIN 14 DAYS OF YOUR PROCEDUE WITH YOUR RESULTS. Do not assume everything is normal if you have not heard from DR. Armon Orvis, CALL HER OFFICE AT [phone].  SEEK IMMEDIATE MEDICAL ATTENTION AND CALL THE OFFICE: [phone] IF:  You have more than a spotting of blood in your stool.   Your belly is swollen (abdominal distention).   You are nauseated or vomiting.   You have a temperature over 101F.   You have abdominal pain or discomfort that is severe or gets worse throughout the day.   Lifestyle and home remedies TO CONTROL HEARTBURN/REFLUX You may eliminate or reduce the frequency of heartburn by making the following lifestyle changes:   Control your weight. Being overweight is a major risk factor for heartburn and GERD. Excess pounds put pressure on your abdomen, pushing up your stomach and causing acid to back up into your esophagus.    Eat smaller meals. 4 TO 6 MEALS A DAY. This reduces pressure on the lower esophageal sphincter, helping to prevent the valve from  opening and acid from washing back into your esophagus.    Loosen your belt. Clothes that fit tightly around your waist put pressure on your abdomen and the lower esophageal sphincter.    Eliminate heartburn triggers. Everyone has specific triggers. Common triggers such as fatty or fried foods, spicy food, tomato sauce, carbonated beverages, alcohol, chocolate, mint, garlic, onion, caffeine and nicotine may make heartburn worse.    Avoid stooping or bending. Tying your shoes is OK. Bending over for longer periods to weed your garden isn't,  especially soon after eating.    Don't lie down after a meal. Wait at least three to four hours after eating before going to bed, and don't lie down right after eating.     SLEEP ON A WEDGE OR PLACE THE HEAD OF YOUR BED ON 6 IN BLOCKS.  Alternative medicine  Several home remedies exist for treating GERD, but they provide only temporary relief. They include drinking baking soda (sodium bicarbonate) added to water or drinking other fluids such as baking soda mixed with cream of tartar and water.  Although these liquids create temporary relief by neutralizing, washing away or buffering acids, eventually they aggravate the situation by adding gas and fluid to your stomach, increasing pressure and causing more acid reflux. Further, adding more sodium to your diet may increase your blood pressure and add stress to your heart, and excessive bicarbonate ingestion can alter the acid-base balance in your body.   Gastritis  Gastritis is an inflammation (the body's way of reacting to injury and/or infection) of the stomach. It is often caused by viral or bacterial (germ) infections. It can also be caused BY ASPIRIN, BC/GOODY POWDER'S, (IBUPROFEN) MOTRIN, OR ALEVE (NAPROXEN), chemicals (including alcohol), SPICY FOODS, and medications. This illness may be associated with generalized malaise (feeling tired, not well), UPPER ABDOMINAL STOMACH cramps, and fever. One common bacterial cause of gastritis is an organism known as H. Pylori. This can be treated with antibiotics.

## 2019-01-30 NOTE — H&P (Signed)
Primary Care Physician:  Kathyrn Drown, MD Primary Gastroenterologist:  Dr. Oneida Alar  Pre-Procedure History & Physical: HPI:  Carla Wise is a 53 y.o. female here for DYSPEPSIA. On PPI prn with history of GERD  Past Medical History:  Diagnosis Date  . Constipation   . Gastric adenoma 02/2017  . PONV (postoperative nausea and vomiting)     Past Surgical History:  Procedure Laterality Date  . ESOPHAGOGASTRODUODENOSCOPY N/A 03/01/2017   Dr. Oneida Alar: benign-appearing peptic stricture s/p dilation, gastric polyp (tubular adenoma), gastritis, duodenal diverticulm  . EXAM UNDER ANESTHESIA WITH MANIPULATION OF SHOULDER Left 06/06/2017   Procedure: EXAM UNDER ANESTHESIA WITH MANIPULATION OF SHOULDER;  Surgeon: Carole Civil, MD;  Location: [facility];  Service: Orthopedics;  Laterality: Left;  full relaxation  . FOOT SURGERY Left 2003   hammer toe- great toe  . KNEE ARTHROSCOPY W/ MENISCECTOMY     left knee  . SAVORY DILATION N/A 03/01/2017   Procedure: SAVORY DILATION;  Surgeon: Danie Binder, MD;  Location: [facility];  Service: Endoscopy;  Laterality: N/A;    Prior to Admission medications   Medication Sig Start Date End Date Taking? Authorizing Provider  ALPRAZolam (XANAX) 0.5 MG tablet TAKE 1/2-1 TABLET BY MOUTH AT BEDTIME AS NEEDED. Patient taking differently: Take 0.25 mg by mouth at bedtime as needed for sleep.  09/03/18  Yes Mikey Kirschner, MD  dexlansoprazole (DEXILANT) 60 MG capsule Take 1 capsule (60 mg total) by mouth daily before breakfast. 01/28/19  Yes Mahala Menghini, PA-C  valACYclovir (VALTREX) 500 MG tablet Take 500-1,000 mg by mouth See admin instructions. Take 1000 mg at first onset of fever blisters, then take 500 mg daily for 3 days   Yes [provider]  sucralfate (CARAFATE) 1 g tablet Take 1 tablet (1 g total) by mouth 4 (four) times daily -  with meals and at bedtime. Patient not taking: Reported on 01/28/2019 01/01/19   Cheyenne Adas, NP     Allergies as of 01/28/2019 - Review Complete 01/28/2019  Allergen Reaction Noted  . Azithromycin  11/23/2013  . Levaquin [levofloxacin in d5w]  02/04/2015    Family History  Problem Relation Age of Onset  . Heart disease Other   . Cancer Other   . Colon cancer Neg Hx     Social History   Socioeconomic History  . Marital status: Married    Spouse name: Not on file  . Number of children: Not on file  . Years of education: 12th grade  . Highest education level: Not on file  Occupational History  . Occupation: [employer]    Comment: Arboriculturist   Social Needs  . Financial resource strain: Not on file  . Food insecurity:    Worry: Not on file    Inability: Not on file  . Transportation needs:    Medical: Not on file    Non-medical: Not on file  Tobacco Use  . Smoking status: Current Every Day Smoker    Packs/day: 0.50    Years: 25.00    Pack years: 12.50    Types: Cigarettes  . Smokeless tobacco: Never Used  Substance and Sexual Activity  . Alcohol use: No  . Drug use: No  . Sexual activity: Yes    Birth control/protection: Surgical  Lifestyle  . Physical activity:    Days per week: Not on file    Minutes per session: Not on file  . Stress: Not on file  Relationships  .  Social connections:    Talks on phone: Not on file    Gets together: Not on file    Attends religious service: Not on file    Active member of club or organization: Not on file    Attends meetings of clubs or organizations: Not on file    Relationship status: Not on file  . Intimate partner violence:    Fear of current or ex partner: Not on file    Emotionally abused: Not on file    Physically abused: Not on file    Forced sexual activity: Not on file  Other Topics Concern  . Not on file  Social History Narrative  . Not on file    Review of Systems: See HPI, otherwise negative ROS   Physical Exam: BP 93/68   Pulse 81   Temp 97.9 F (36.6 C) (Oral)   Resp 14   Ht 5\' 2"  (1.575  m)   Wt 49 kg   SpO2 100%   BMI 19.75 kg/m  General:   Alert,  pleasant and cooperative in NAD Head:  Normocephalic and atraumatic. Neck:  Supple; Lungs:  Clear throughout to auscultation.    Heart:  Regular rate and rhythm. Abdomen:  Soft, nontender and nondistended. Normal bowel sounds, without guarding, and without rebound.   Neurologic:  Alert and  oriented x4;  grossly normal neurologically.  Impression/Plan:     DYSPEPSIA  PLAN:  EGD TODAY. DISCUSSED PROCEDURE, BENEFITS, & RISKS: < 1% chance of medication reaction, bleeding, OR perforation.

## 2019-02-02 ENCOUNTER — Telehealth: Payer: Self-pay | Admitting: Gastroenterology

## 2019-02-02 NOTE — Telephone Encounter (Signed)
Tried to call and mailbox full.

## 2019-02-02 NOTE — Telephone Encounter (Signed)
Called pt, could not leave a message. We sent in [place].

## 2019-02-02 NOTE — Telephone Encounter (Signed)
PT thinks she is going to get the [place], but will let us know if she needs the Pantoprazole called in.

## 2019-02-02 NOTE — Telephone Encounter (Signed)
We switched her to dexilant at office visit because she did not feel like pantoprazole was working well.   She can go back on pantoprazole 40mg  once daily before breakfast. Can send in new RX for #30 11 refills if needed.   If she finds that her symptoms do not settle down then we can try omeprazole or increase pantoprazole to bid.

## 2019-02-02 NOTE — Telephone Encounter (Signed)
Called and mailbox full.  

## 2019-02-02 NOTE — Telephone Encounter (Signed)
PLEASE CALL PATIENT ABOUT HER PRESCRIPTIONS, SAID THE PHARMACY FILLED THE WRONG ONE

## 2019-02-02 NOTE — Telephone Encounter (Signed)
PLEASE CALL PT. SHE HAD ONE BENIGN POLYPS REMOVED FROM HER STOMACH. HER SMALL BOWEL BIOPSIES SHOWS MILD INFLAMMATION.    DRINK WATER TO KEEP YOUR URINE LIGHT YELLOW. CUT DOWN ON YOUR SMOKING ESPECIALLY WHEN YOUR REFLUX IS NOT CONTROLLED. AVOID TRIGGERS FOR REFLUX.  FOLLOW A LOW FAT DIET. MEATS SHOULD BE BAKED, BROILED, OR BOILED. AVOID FRIED FOODS.    START DEXILANT DAILY. YOU NEED TO BE ON AN ACID BLOCKER FOREVER. FOLLOW UP IN 4 MOS.

## 2019-02-02 NOTE — Telephone Encounter (Signed)
Fyi to Neil Crouch, PA who sent in the [place].

## 2019-02-02 NOTE — Telephone Encounter (Signed)
Pt called saying that [facility] wanting her to have Dexilant 60mg  and it was called into [facility]. Pt said the prescription she got from the pharmacy was Protonix 40mg . She is wanting to know is this the same thing or did the pharmacy give her the wrong prescription. I told her to call pharmacy and ask and I would send a message to [facility] nurse to see as well. Please advise. [phone]

## 2019-02-02 NOTE — Telephone Encounter (Signed)
See previous note

## 2019-02-02 NOTE — Telephone Encounter (Signed)
I called [facility] and spoke to pharmacist, Tripp. He said the Dexilant was just too expensive, pt said she could not afford it. No PA required, just too expensive.  He gave her protonix which she had been on from PCP because it only cost her $5.00.

## 2019-02-03 ENCOUNTER — Encounter (HOSPITAL_COMMUNITY): Payer: Self-pay | Admitting: Gastroenterology

## 2019-02-03 NOTE — Telephone Encounter (Signed)
PT was informed on 02/02/2019.

## 2019-02-04 NOTE — Telephone Encounter (Signed)
OV made and pt aware.  

## 2019-02-24 ENCOUNTER — Telehealth: Payer: Self-pay | Admitting: Family Medicine

## 2019-02-24 NOTE — Telephone Encounter (Signed)
Advised pt to go to ED. Pt verbalized understanding.

## 2019-02-24 NOTE — Telephone Encounter (Signed)
Patient called in for an appt for stomach pain since Friday. Not constant, comes and goes with occasional stabbing like pain in the general area of the lower stomach and pelvic area. Not one side more than the other. Not associated with eating, no fever or any other symptoms. No travel. Patient says she is on a point system at work and can only come at 4 or after.  I explained our new schedule so I wasn't sure what to do with this patient?

## 2019-02-24 NOTE — Telephone Encounter (Signed)
Abdominal pain for 4 days, pain in all over abdomen. Movement makes it worse, like walking too fast, has not found any relief, heating pad helped some, tried soaking in a tub of hot water, pain wakes her up at night, hurting most of the time, pretty constant, no nausea, vomiting, no diarrhea, no fever.

## 2019-03-04 ENCOUNTER — Telehealth: Payer: Self-pay | Admitting: *Deleted

## 2019-03-04 MED ORDER — OMEPRAZOLE 20 MG PO CPDR: 20.0000 mg | DELAYED_RELEASE_CAPSULE | Freq: Every day | ORAL | 5 refills | Status: DC

## 2019-03-04 NOTE — Addendum Note (Signed)
Addended by: Mahala Menghini on: 03/04/2019 01:49 PM   Modules accepted: Orders

## 2019-03-04 NOTE — Telephone Encounter (Signed)
Pt says Dexilant is not working for her.  Pt still feels a minty burning feeling.  She says she isn't eating much either.  Pt only has 3 pills left.  She did not want to call [facility] for refill just on case we want to change her medication.

## 2019-03-04 NOTE — Telephone Encounter (Signed)
PT said she doesn't have a preference, just whichever one [facility] recommends.

## 2019-03-04 NOTE — Telephone Encounter (Signed)
I called pt and she said the [place] worked a little for [employer], but not now.  She said she is not eating a lot, and not eating fried foods and avoiding sodas. Still she has that burning feeling after she eats. Forwarding to Neil Crouch, PA to advise!

## 2019-03-04 NOTE — Telephone Encounter (Signed)
Sent in rx for omeprazole once daily.

## 2019-03-04 NOTE — Telephone Encounter (Signed)
Pt is aware.  

## 2019-03-04 NOTE — Telephone Encounter (Signed)
We can try pantoprazole BID (previously did not have good control on once daily) or we can try omeprazole once daily initially. Let me know what she would prefer.   Continue antireflux measures including avoiding fried foods, spicy foods, wait 3 hours between eating and laying down, and cut back on smoking.

## 2019-03-05 ENCOUNTER — Ambulatory Visit: Payer: BLUE CROSS/BLUE SHIELD | Admitting: Nurse Practitioner

## 2019-03-06 ENCOUNTER — Telehealth: Payer: Self-pay | Admitting: Gastroenterology

## 2019-03-06 DIAGNOSIS — K219 Gastro-esophageal reflux disease without esophagitis: Secondary | ICD-10-CM

## 2019-03-06 DIAGNOSIS — R079 Chest pain, unspecified: Secondary | ICD-10-CM

## 2019-03-06 MED ORDER — LIDOCAINE VISCOUS HCL 2 % MT SOLN: OROMUCOSAL | 5 refills | Status: DC

## 2019-03-06 MED ORDER — OMEPRAZOLE 20 MG PO CPDR: DELAYED_RELEASE_CAPSULE | ORAL | 5 refills | Status: DC

## 2019-03-06 NOTE — Telephone Encounter (Signed)
Called patient TO DISCUSS RESULTS. [place] DIDN'T WORK.  CAN'T HALF EAT. DRINKS HALF MT DEW AND LIVING OFF CRACKERS. DOESN'T EAT AFTER 6 PM. IF DOESN'T EAT STILL HAS PAIN JUST NOT AS BAD. MINTY FEELING/BURNING. NO NAUSEA, VOMITING OR SWEATING. NO ABDOMINAL PAIN.   TAKING OMEPRAZOLE ONCE A DAY AND INSTRUCTED TO INCREASE OMEPRAZOLE  TO BID FOR NEXT TWO WEEKS. CALL IN 2 WEEKS IF IT'S NOT BETTER. SCHEDULE PT FOR MANOMETRY/IMPEDANCE/pH IN 2-3 WEEKS IN [place], Dx: GERD UNCONTROLLED. ADD VISCOUS LIDOCAINE 5-10 MLS QACHS AND MAT REPEAT DOSE Q4H .  NEEDS CXR: PA/LAT NEXT WEEK, Dx: CHEST PAIN AND IF CXR NEGATIVE THEN WE COULD CONSIDER CT OF CHEST.

## 2019-03-09 NOTE — Addendum Note (Signed)
Addended by: Hassan Rowan on: 03/09/2019 08:44 AM   Modules accepted: Orders

## 2019-03-09 NOTE — Addendum Note (Signed)
Addended by: Hassan Rowan on: 03/09/2019 08:34 AM   Modules accepted: Orders

## 2019-03-09 NOTE — Telephone Encounter (Signed)
Referral sent to [place] via Epic.  Called [facility] radiology, no routine x-rays are being done at this time d/t COVID-19 restrictions. X-ray order can be put in as STAT ([facility] wants CXR done this week, chest pain). Re-entered order for stat. Informed pt, she will call radiology prior to going for CXR.

## 2019-03-10 ENCOUNTER — Other Ambulatory Visit: Payer: Self-pay

## 2019-03-10 ENCOUNTER — Ambulatory Visit (HOSPITAL_COMMUNITY)
Admission: RE | Admit: 2019-03-10 | Discharge: 2019-03-10 | Disposition: A | Discharge status: Home / Self Care | Payer: BLUE CROSS/BLUE SHIELD | Source: Ambulatory Visit | Attending: Gastroenterology | Admitting: Gastroenterology

## 2019-03-10 DIAGNOSIS — J449 Chronic obstructive pulmonary disease, unspecified: Secondary | ICD-10-CM | POA: Diagnosis not present

## 2019-03-10 DIAGNOSIS — R079 Chest pain, unspecified: Secondary | ICD-10-CM | POA: Insufficient documentation

## 2019-03-19 ENCOUNTER — Telehealth: Payer: Self-pay | Admitting: Gastroenterology

## 2019-03-19 MED ORDER — AMITRIPTYLINE HCL 10 MG PO TABS: ORAL_TABLET | ORAL | 11 refills | Status: DC

## 2019-03-19 MED ORDER — OMEPRAZOLE 20 MG PO CPDR: DELAYED_RELEASE_CAPSULE | ORAL | 5 refills | Status: DC

## 2019-03-19 NOTE — Telephone Encounter (Signed)
CHANGED APPOINTMENT TO [facility]

## 2019-03-19 NOTE — Telephone Encounter (Addendum)
HASN'T HEARD FROM [facility] REGARDING SWALLOWING EVAL. STILL HAVING BURNING AND BELCHING AND IT'S CONSTANT. EATING STEAMED VEGETABLES AND BOILED CHICKEN. NOT INCREASED. OMEPRAZOLE HAS HELPED SOME. CHEST XRAY: WNLs. NO ODYNOPHAGIA OR DYSPHAGIA. NO VOMITING OR NAUSEA. VISCOUS LIDOCAINE NUMBS WHILE EATS AND GETS FOOD DOWN.  TO BETTER CONTROL SYMTPOMS: 1. ADD ELAVIL AND INCREASE EVERY 7 DAYS TO 30 MG DAILY. 2. CONTINUE OMEPRAZOLE TWICE DAILY 3. USE VISCOUS LIDOCAINE QAC AND PRN.   CHANGE APPT TO JUN 3 @330P  WITH [facility] AND CANCEL APPT WITH [facility].  MED SIDE EFFECTS DISCUSSED. PT SHOULD CALL WITH QUESTIONS OR CONCERNS.

## 2019-03-19 NOTE — Telephone Encounter (Signed)
REVIEWED-NO ADDITIONAL RECOMMENDATIONS. 

## 2019-05-13 ENCOUNTER — Ambulatory Visit (INDEPENDENT_AMBULATORY_CARE_PROVIDER_SITE_OTHER): Payer: BC Managed Care – PPO | Admitting: Gastroenterology

## 2019-05-13 ENCOUNTER — Encounter: Payer: Self-pay | Admitting: Gastroenterology

## 2019-05-13 ENCOUNTER — Other Ambulatory Visit: Payer: Self-pay

## 2019-05-13 DIAGNOSIS — R1013 Epigastric pain: Secondary | ICD-10-CM | POA: Diagnosis not present

## 2019-05-13 DIAGNOSIS — K219 Gastro-esophageal reflux disease without esophagitis: Secondary | ICD-10-CM | POA: Diagnosis not present

## 2019-05-13 DIAGNOSIS — K319 Disease of stomach and duodenum, unspecified: Secondary | ICD-10-CM

## 2019-05-13 MED ORDER — AMITRIPTYLINE HCL 25 MG PO TABS: ORAL_TABLET | ORAL | 11 refills | Status: DC

## 2019-05-13 NOTE — Assessment & Plan Note (Signed)
SYMPTOMS FAIRLY WELL CONTROLLED.  DRINK WATER TO KEEP YOUR URINE LIGHT YELLOW. FOLLOW A LOW FAT DIET. MEATS SHOULD BE BAKED, BROILED, OR BOILED. AVOID FRIED FOODS. CONTINUE OMEPRAZOLE.  TAKE 30 MINUTES PRIOR TO YOUR MEALS TWICE DAILY. FOLLOW UP IN 4 MOS.  PLEASE CALL WITH QUESTIONS OR CONCERNS.

## 2019-05-13 NOTE — Patient Instructions (Signed)
DRINK WATER TO KEEP YOUR URINE LIGHT YELLOW.  FOLLOW A LOW FAT DIET. MEATS SHOULD BE BAKED, BROILED, OR BOILED. AVOID FRIED FOODS.  CONTINUE OMEPRAZOLE.  TAKE 30 MINUTES PRIOR TO YOUR MEALS TWICE DAILY.  CONTINUE ELAVIL. INCREASE TO 50 MG AT BEDTIME.  FOLLOW UP IN 4 MOS.   PLEASE CALL WITH QUESTIONS OR CONCERNS.

## 2019-05-13 NOTE — Assessment & Plan Note (Signed)
CLINICALLY IMPROVED. SYMPTOMS NOT IDEALLY CONTROLLED.  CONTINUE ELAVIL. INCREASE TO 50 MG AT BEDTIME. CALL WITH QUESTIONS OR CONCERNS. FOLLOW UP IN 4 MOS.

## 2019-05-13 NOTE — Progress Notes (Signed)
Subjective:    Patient ID: Carla Wise, female    DOB: 1966-05-18, 52 y.o.   MRN: [id number]  Kathyrn Drown, MD  HPI Since FEB 2020 feels she's better. DEXILANT HELPED AND THEN STOPPED AND CHANGED TO BID MEDS. DOES A LITTLE MORE THAN STEAMED VEGGIES. DRINKS HALF 12 OZ BOTTLE SODA. ONE BOOST A DAY. DOESN'T SLEEP GOOD. NO SIDE EFFECTS OF ELAVIL. STRESS LEVEL BETTER. STILL FEELS THE MINTY AND BELCHING SOME.  PT DENIES FEVER, CHILLS, HEMATOCHEZIA, HEMATEMESIS, nausea, vomiting, melena, diarrhea, CHEST PAIN, SHORTNESS OF BREATH, CHANGE IN BOWEL IN HABITS, constipation, abdominal pain, problems swallowing, OR heartburn or indigestion.  Past Medical History:  Diagnosis Date  . Constipation   . Gastric adenoma 02/2017  . PONV (postoperative nausea and vomiting)    Past Surgical History:  Procedure Laterality Date  . BIOPSY  01/30/2019   Procedure: BIOPSY;  Surgeon: Danie Binder, MD;  [facility]: [facility];  Service: Endoscopy;;  . ESOPHAGOGASTRODUODENOSCOPY N/A 03/01/2017   Dr. Oneida Alar: benign-appearing peptic stricture s/p dilation, gastric polyp (tubular adenoma), gastritis, duodenal diverticulm  . ESOPHAGOGASTRODUODENOSCOPY N/A 01/30/2019   Procedure: ESOPHAGOGASTRODUODENOSCOPY (EGD);  Surgeon: Danie Binder, MD;  Location: [facility];  Service: Endoscopy;  Laterality: N/A;  8:30am  . EXAM UNDER ANESTHESIA WITH MANIPULATION OF SHOULDER Left 06/06/2017   Procedure: EXAM UNDER ANESTHESIA WITH MANIPULATION OF SHOULDER;  Surgeon: Carole Civil, MD;  Location: [facility];  Service: Orthopedics;  Laterality: Left;  full relaxation  . FOOT SURGERY Left 2003   hammer toe- great toe  . KNEE ARTHROSCOPY W/ MENISCECTOMY     left knee  . SAVORY DILATION N/A 03/01/2017   Procedure: SAVORY DILATION;  Surgeon: Danie Binder, MD;  [facility];  Service: Endoscopy;  Laterality: N/A;   Allergies  Allergen Reactions  . Azithromycin     Patient gets worst whenever she takes a  Z-Pak  . Levaquin [Levofloxacin In D5w]     Makes symptoms worse    Current Outpatient Medications  Medication Sig    . ALPRAZolam (XANAX) 0.5 MG tablet TAKE 1/2-1 TABLET BY MOUTH AT BEDTIME AS NEEDED. (Patient taking differently: Take 0.25 mg by mouth at bedtime as needed for sleep. )    . amitriptyline (ELAVIL) 10 MG tablet 3 po qhs.    . lidocaine (XYLOCAINE) 2 % solution Using a syringe, 5-10 mL PO q4h or 30 MINS PRIOR TO MEALS AT BEDTIME AND PRN FOR ABDOMINAL/CHEST PAIN. NO MORE> 8 DOSES/DAY.    Marland Kitchen omeprazole (PRILOSEC) 20 MG capsule 1 PO 30 MINS PRIOR TO MEALS TWICE DAILY    . valACYclovir (VALTREX) 500 MG tablet Take 500-1,000 mg by mouth See admin instructions. Take 1000 mg at first onset of fever blisters, then take 500 mg daily for 3 days    .       Review of Systems PER HPI OTHERWISE ALL SYSTEMS ARE NEGATIVE.    Objective:   Physical Exam Vitals signs reviewed.  Constitutional:      General: She is not in acute distress.    Appearance: She is well-developed.  HENT:     Head: Normocephalic and atraumatic.     Mouth/Throat:     Pharynx: No oropharyngeal exudate.  Eyes:     General: No scleral icterus.    Pupils: Pupils are equal, round, and reactive to light.  Neck:     Musculoskeletal: Normal range of motion and neck supple.  Cardiovascular:     Rate  and Rhythm: Normal rate and regular rhythm.     Heart sounds: Normal heart sounds.  Pulmonary:     Effort: Pulmonary effort is normal. No respiratory distress.     Breath sounds: Normal breath sounds.  Abdominal:     General: Bowel sounds are normal. There is no distension.     Palpations: Abdomen is soft.     Tenderness: There is no abdominal tenderness.  Lymphadenopathy:     Cervical: No cervical adenopathy.  Neurological:     Mental Status: She is alert and oriented to person, place, and time. Mental status is at baseline.  Psychiatric:        Mood and Affect: Mood normal.     Comments: NORMAL AFFECT        Assessment & Plan:

## 2019-05-14 ENCOUNTER — Telehealth: Payer: Self-pay

## 2019-05-14 NOTE — Telephone Encounter (Signed)
Dr. Oneida Alar, pt called office and wants you to call her. She has some questions. [phone]

## 2019-05-14 NOTE — Progress Notes (Signed)
cc'ed to pcp °

## 2019-05-14 NOTE — Telephone Encounter (Signed)
Called patient TO DISCUSS CONCERNS. EXPLAINED SHE HAS DYSPEPSIA AND GERD. REASSURED HER SO SHE CAN REASSURE HER HUSBAND THAT NOTHING IS SERIOUSLY WRONG AND SHE IS ON THE RIGHT MEDS TO CONTROL HER CHRONIC SYMPTOMS.

## 2019-05-25 ENCOUNTER — Other Ambulatory Visit: Payer: Self-pay

## 2019-05-25 ENCOUNTER — Ambulatory Visit (INDEPENDENT_AMBULATORY_CARE_PROVIDER_SITE_OTHER): Payer: BC Managed Care – PPO | Admitting: Family Medicine

## 2019-05-25 DIAGNOSIS — S20212A Contusion of left front wall of thorax, initial encounter: Secondary | ICD-10-CM | POA: Diagnosis not present

## 2019-05-25 MED ORDER — HYDROCODONE-ACETAMINOPHEN 5-325 MG PO TABS: 1.0000 | ORAL_TABLET | ORAL | 0 refills | Status: DC | PRN

## 2019-05-25 NOTE — Progress Notes (Signed)
   Subjective:    Patient ID: Carla Wise, female    DOB: 05-06-1966, 53 y.o.   MRN: [id number]  Flank Pain This is a new problem. Pain location: Left side. Radiates to: pt states that the pain goes around to under breast area. Pertinent negatives include no abdominal pain, chest pain or weakness. She has tried ice for the symptoms.   Fell at home about a week ago.Pt states her elbow jabbed into side.  Pt states that when she takes a deep breath. Painful Patient relates that she gets a sharp pain when she takes a deep breath when she coughs she denies any wheezing difficulty breathing denies high fever chills sweats or cough no vomiting or diarrhea  Review of Systems  Constitutional: Negative for activity change and appetite change.  HENT: Negative for congestion and rhinorrhea.   Respiratory: Negative for cough and shortness of breath.   Cardiovascular: Negative for chest pain and leg swelling.  Gastrointestinal: Negative for abdominal pain, nausea and vomiting.  Genitourinary: Positive for flank pain.  Skin: Negative for color change.  Neurological: Negative for dizziness and weakness.  Psychiatric/Behavioral: Negative for agitation and confusion.       Objective:   Physical Exam    Patient had virtual visit Appears to be in no distress Atraumatic Neuro able to relate and oriented No apparent resp distress Color normal     Assessment & Plan:  The area she points to is more in the left anterior axilla line consistent with rib contusion possible tear of the muscles between the ribs more than likely this will take anywhere between 2 and 6 weeks to heal up hydrocodone can be used in the evening time anti-inflammatories during the day currently I do not feel x-rays would be beneficial we did discuss this with the patient she will follow-up if ongoing troubles if she is not seeing improvement over the next couple weeks she will let us know we will go forward with x-rays If she  does not heal up she will also let us know Hydrocodone for evening use only not for long-term use caution drowsiness home use only

## 2019-07-02 ENCOUNTER — Other Ambulatory Visit: Payer: Self-pay | Admitting: Family Medicine

## 2019-09-16 ENCOUNTER — Other Ambulatory Visit: Payer: Self-pay

## 2019-09-16 ENCOUNTER — Encounter: Payer: Self-pay | Admitting: Gastroenterology

## 2019-09-16 ENCOUNTER — Ambulatory Visit: Payer: BC Managed Care – PPO | Admitting: Gastroenterology

## 2019-09-16 DIAGNOSIS — R1013 Epigastric pain: Secondary | ICD-10-CM

## 2019-09-16 DIAGNOSIS — R1310 Dysphagia, unspecified: Secondary | ICD-10-CM | POA: Diagnosis not present

## 2019-09-16 DIAGNOSIS — K319 Disease of stomach and duodenum, unspecified: Secondary | ICD-10-CM

## 2019-09-16 DIAGNOSIS — R1319 Other dysphagia: Secondary | ICD-10-CM

## 2019-09-16 MED ORDER — AMOXICILLIN-POT CLAVULANATE 500-125 MG PO TABS: ORAL_TABLET | ORAL | 0 refills | Status: DC

## 2019-09-16 NOTE — Assessment & Plan Note (Signed)
SYMPTOMS FAIRLY WELL CONTROLLED.  CONTINUE TO MONITOR SYMPTOMS. FOLLOW UP IN 6 MOS.  

## 2019-09-16 NOTE — Progress Notes (Signed)
Subjective:    Patient ID: Carla Wise, female    DOB: 11-01-1966, 53 y.o.   MRN: [id number]  Kathyrn Drown, MD  HPI Gets up belching and feel like something aint right. No fried foods. Soda half. Does it off and on all day. Burning aint as bad. Cigs:3-4/day. HAS FLATULENCE. NEVER TRIED ABX TO RESET GUT. BMs: q2-3 days(#4-5) Tolerated Augmentin. WHEN SHE EATS SHE CAN CHOKE ON CERTAIN FOODS. Today MILD BURNING. CUT BACK ON MT DEW, SWEET TEA, AND CHOCOLATE.  PT DENIES FEVER, CHILLS, HEMATOCHEZIA, HEMATEMESIS, nausea, vomiting, melena, diarrhea, CHEST PAIN, SHORTNESS OF BREATH, CHANGE IN BOWEL IN HABITS, constipation, abdominal pain, poblems with sedation, or heartburn or indigestion.  Past Medical History:  Diagnosis Date  . Constipation   . Gastric adenoma 02/2017  . PONV (postoperative nausea and vomiting)    Past Surgical History:  Procedure Laterality Date  . BIOPSY  01/30/2019   Procedure: BIOPSY;  Surgeon: Danie Binder, MD;  [facility];  Service: Endoscopy;;  . ESOPHAGOGASTRODUODENOSCOPY N/A 03/01/2017   Dr. Oneida Alar: benign-appearing peptic stricture s/p dilation, gastric polyp (tubular adenoma), gastritis, duodenal diverticulm  . ESOPHAGOGASTRODUODENOSCOPY N/A 01/30/2019   Procedure: ESOPHAGOGASTRODUODENOSCOPY (EGD);  Surgeon: Danie Binder, MD;  [facility];  Service: Endoscopy;  Laterality: N/A;  8:30am  . EXAM UNDER ANESTHESIA WITH MANIPULATION OF SHOULDER Left 06/06/2017   Procedure: EXAM UNDER ANESTHESIA WITH MANIPULATION OF SHOULDER;  Surgeon: Carole Civil, MD;  Location: [facility];  Service: Orthopedics;  Laterality: Left;  full relaxation  . FOOT SURGERY Left 2003   hammer toe- great toe  . KNEE ARTHROSCOPY W/ MENISCECTOMY     left knee  . SAVORY DILATION N/A 03/01/2017   Procedure: SAVORY DILATION;  Surgeon: Danie Binder, MD;  [facility];  Service: Endoscopy;  Laterality: N/A;   Allergies  Allergen Reactions  .  Azithromycin     Patient gets worst whenever she takes a Z-Pak  . Levaquin [Levofloxacin In D5w]     Makes symptoms worse    Current Outpatient Medications  Medication Sig    . amitriptyline (ELAVIL) 25 MG tablet 2 PO QHS    . omeprazole (PRILOSEC) 20 MG capsule 1 PO 30 MINS PRIOR TO MEALS TWICE DAILY    . valACYclovir (VALTREX) 500 MG tablet Take 500-1,000 mg by mouth See admin instructions. Take 1000 mg at first onset of fever blisters, then take 500 mg daily for 3 days    .      .      .      .       Review of Systems PER HPI OTHERWISE ALL SYSTEMS ARE NEGATIVE.    Objective:   Physical Exam Vitals signs reviewed.  Constitutional:      General: She is not in acute distress.    Appearance: She is well-developed.  HENT:     Head: Normocephalic and atraumatic.     Mouth/Throat:     Comments: MASK IN PLACE Eyes:     General: No scleral icterus.    Pupils: Pupils are equal, round, and reactive to light.  Neck:     Musculoskeletal: Normal range of motion and neck supple.  Cardiovascular:     Rate and Rhythm: Normal rate and regular rhythm.     Heart sounds: Normal heart sounds.  Pulmonary:     Effort: Pulmonary effort is normal. No respiratory distress.     Breath sounds: Normal breath sounds.  Abdominal:     General: Bowel sounds are normal. There is no distension.     Palpations: Abdomen is soft.     Tenderness: There is no abdominal tenderness.  Musculoskeletal:     Right lower leg: No edema.     Left lower leg: No edema.  Lymphadenopathy:     Cervical: No cervical adenopathy.  Neurological:     Mental Status: She is alert and oriented to person, place, and time.     Comments: NO FOCAL DEFICITS  Psychiatric:        Mood and Affect: Mood normal.     Comments: NORMAL AFFECT       Assessment & Plan:

## 2019-09-16 NOTE — Patient Instructions (Signed)
TO REDUCE BELCHING AND FLATULENCE:    1. DRINK WATER TO KEEP YOUR URINE LIGHT YELLOW.    2. AVOID ITEMS THAT CAUSE BLOATING & GAS. SEE INFO BELOW.    3. TAKE Augmentin 500 mg twice daily for 5-7 days. IT MAY CAUSE ABDOMINAL PAIN, NAUSEA, VOMITING, RASH, OR EXPLOSIVE DIARRHEA.    4. CONTINUE OMEPRAZOLE AND AMITRIPTYLINE.   Please CALL or SEND me A MY CHART MESSAGE IF YOU HAVE QUESTIONS OR CONCERNS.  FOLLOW UP IN 4 MOS.    BLOATING AND GAS PREVENTION  Although gas may be uncomfortable and embarrassing, it is not life-threatening. Understanding causes, ways to reduce symptoms, and treatment will help most people find some relief. Points to remember . Everyone has gas in the digestive tract. Marland Kitchen People often believe normal passage of gas to be excessive. . Gas comes from two main sources: swallowed air and normal breakdown of certain foods by harmless bacteria naturally present in the large intestine. . Many foods with carbohydrates can cause gas. Fats and proteins cause little gas. . Foods that may cause gas include o beans  o vegetables, such as broccoli, cabbage, brussels sprouts, onions, artichokes, and asparagus  o fruits, such as pears, apples, and peaches  o whole grains, such as whole wheat and bran  o soft drinks and fruit drinks  o milk and milk products, such as cheese and ice cream, and packaged foods prepared with lactose, such as bread, cereal, and salad dressing  o foods containing sorbitol, such as dietetic foods and sugar free candies and gums . The most common symptoms of gas are belching, flatulence, bloating, and abdominal pain. However, some of these symptoms are often caused by an intestinal disorder, such as irritable bowel syndrome, rather than too much gas. . The most common ways to reduce the discomfort of gas are changing diet, taking nonprescription medicines, and reducing the amount of air swallowed. . Digestive enzymes, such as lactase supplements, actually  help digest carbohydrates and may allow people to eat foods that normally cause gas.

## 2019-09-16 NOTE — Assessment & Plan Note (Signed)
CLINICALLY IMPROVED BUT SYMPTOMS NOT IDEALLY CONTROLLED.  TO REDUCE BELCHING AND FLATULENCE:    1. DRINK WATER TO KEEP YOUR URINE LIGHT YELLOW.   2. AVOID ITEMS THAT CAUSE BLOATING & GAS.  HANDOUT GIVEN.   3. TAKE Augmentin 500 mg twice daily for 5-7 days. IT MAY CAUSE ABDOMINAL PAIN, NAUSEA, VOMITING, RASH, OR EXPLOSIVE DIARRHEA.   4. CONTINUE OMEPRAZOLE AND AMITRIPTYLINE.  Please CALL or SEND me A MY CHART MESSAGE IF YOU HAVE QUESTIONS OR CONCERNS. FOLLOW UP IN 4 MOS.

## 2019-09-17 NOTE — Progress Notes (Signed)
ON RECALL  °

## 2019-10-02 ENCOUNTER — Other Ambulatory Visit: Payer: Self-pay

## 2019-10-02 ENCOUNTER — Ambulatory Visit: Payer: BC Managed Care – PPO | Admitting: Nurse Practitioner

## 2019-10-02 ENCOUNTER — Encounter: Payer: Self-pay | Admitting: Nurse Practitioner

## 2019-10-02 VITALS — BP 126/80 | Temp 97.3°F | Ht 62.0 in | Wt 109.4 lb

## 2019-10-02 DIAGNOSIS — L989 Disorder of the skin and subcutaneous tissue, unspecified: Secondary | ICD-10-CM | POA: Diagnosis not present

## 2019-10-02 DIAGNOSIS — G4700 Insomnia, unspecified: Secondary | ICD-10-CM

## 2019-10-02 MED ORDER — TRAZODONE HCL 50 MG PO TABS: 25.0000 mg | ORAL_TABLET | Freq: Every evening | ORAL | 2 refills | Status: DC | PRN

## 2019-10-02 NOTE — Progress Notes (Signed)
   Subjective:    Patient ID: Carla Wise, female    DOB: 1965-12-17, 53 y.o.   MRN: [id number]  HPI  Patient arrives for a recheck on a spot on her left arm. Patient states the spot has gotten a little bit bigger. Spot has been present for a month. Nothing makes it worse. Has tried Neosporin with no improvement. Pt having sleep disturbances. Has trouble falling and staying asleep. Reports feeling fatigued. Would like something for sleep. Xanax makes her too drowsy. Has tried hydroxyzine and melatonin with no improvement.  Review of Systems  General: Denies fever or chills. Positive for sleep disturbances. Cardiovascular: Denies chest pain and palpations.  Respiratory: Denies shortness of breath and wheezing. Skin: Positive for new lesion on left arm. Non-tender, non-pruritic lesion.     Objective:   Physical Exam General: Alert and oriented. Well-appearing. Cheerful affect. Cardiovascular. Regular rate and rhythm. No murmurs. Respiratory: Normal lung sounds throughout all lung fields. No wheezing, rhonchi, or crackles. Skin: Mildly erythematous single papule on the lateral, posterior aspect of left forearm with a white, flaky center. Well-defined lesion with shiny edges. No exudate production. Non-tender. Skin is warm and dry.        Assessment & Plan:   Problem List Items Addressed This Visit    None    Visit Diagnoses    Skin lesion of left arm    -  Primary   Relevant Orders   Ambulatory referral to Dermatology     -Will refer to Dermatology. Patient has some mild sun damage and a complete skin examination and evaluation of the lesion is warranted. Start trazodone at the lowest dose, can increase to 50mg . If still having issues with insomnia or new symptoms appear with trazodone, please call office. Recommended patient schedule a well-woman physical.  Return for physical.

## 2019-10-02 NOTE — Progress Notes (Signed)
   Subjective:    Patient ID: Carla Wise, female    DOB: Oct 11, 1966, 53 y.o.   MRN: [id number]  HPI  Patient arrives for a recheck on a spot on her left arm. Patient states the spot has gotten a little bit bigger.  Review of Systems     Objective:   Physical Exam        Assessment & Plan:

## 2019-10-20 ENCOUNTER — Telehealth: Payer: Self-pay | Admitting: Gastroenterology

## 2019-10-20 MED ORDER — AMOXICILLIN-POT CLAVULANATE 500-125 MG PO TABS: ORAL_TABLET | ORAL | 0 refills | Status: DC

## 2019-10-20 NOTE — Telephone Encounter (Signed)
Pt asked if she could get a refill on the amoxicillin. She uses [facility]. She asked for the nurse to call her back. [phone]

## 2019-10-20 NOTE — Telephone Encounter (Signed)
Pt is aware Rx was sent in.  

## 2019-10-20 NOTE — Telephone Encounter (Signed)
Pt last saw Dr. Oneida Alar on 09/16/2019 and was given Augmentin for her extreme gas and flatulence. Dr. Oneida Alar said she would try Augmentin and it helped tremendously. She had a very bad night last night with gas, belching and flatulence and was hoping she could get a refill on the Augmentin. She is aware that Dr. Oneida Alar is out until Thursday and I will check with Neil Crouch, PA who has seen pt in the past.  Magda Paganini, please advise!

## 2019-10-20 NOTE — Telephone Encounter (Signed)
Refill Augmentin.

## 2019-10-20 NOTE — Telephone Encounter (Signed)
I called pt to discuss why she needs the medication. Left VM for a return call.

## 2019-11-02 DIAGNOSIS — Z1283 Encounter for screening for malignant neoplasm of skin: Secondary | ICD-10-CM | POA: Diagnosis not present

## 2019-11-02 DIAGNOSIS — L821 Other seborrheic keratosis: Secondary | ICD-10-CM | POA: Diagnosis not present

## 2019-11-02 DIAGNOSIS — D225 Melanocytic nevi of trunk: Secondary | ICD-10-CM | POA: Diagnosis not present

## 2020-01-07 ENCOUNTER — Other Ambulatory Visit: Payer: Self-pay | Admitting: Family Medicine

## 2020-01-07 NOTE — Telephone Encounter (Signed)
Nurses-may pend prescription then send back thanks if needing frequently then she will need to do a follow-up visit

## 2020-01-07 NOTE — Telephone Encounter (Signed)
Not on med list but called pt and she states she is still taking it but does not take it often. Last visit for xanax was July 2019.

## 2020-01-07 NOTE — Telephone Encounter (Signed)
Pt states she does not take often and med is ready for you to sign

## 2020-02-03 ENCOUNTER — Other Ambulatory Visit: Payer: Self-pay

## 2020-02-03 ENCOUNTER — Encounter: Payer: Self-pay | Admitting: Gastroenterology

## 2020-02-03 ENCOUNTER — Ambulatory Visit (INDEPENDENT_AMBULATORY_CARE_PROVIDER_SITE_OTHER): Payer: BC Managed Care – PPO | Admitting: Gastroenterology

## 2020-02-03 DIAGNOSIS — R1013 Epigastric pain: Secondary | ICD-10-CM

## 2020-02-03 DIAGNOSIS — K319 Disease of stomach and duodenum, unspecified: Secondary | ICD-10-CM

## 2020-02-03 DIAGNOSIS — K219 Gastro-esophageal reflux disease without esophagitis: Secondary | ICD-10-CM | POA: Diagnosis not present

## 2020-02-03 MED ORDER — BACLOFEN 10 MG PO TABS: ORAL_TABLET | ORAL | 11 refills | Status: DC

## 2020-02-03 MED ORDER — AMOXICILLIN-POT CLAVULANATE 500-125 MG PO TABS: ORAL_TABLET | ORAL | 0 refills | Status: DC

## 2020-02-03 NOTE — Assessment & Plan Note (Signed)
SYMPTOMS NOT IDEALLY CONTROLLED.  TO CONTROL THE MINTY TASTE WHICH IS REFLUX:   1. DRINK WATER TO KEEP YOUR URINE LIGHT YELLOW.   2. CONTINUE OMEPRAZOLE.  TAKE 30 MINUTES PRIOR TO YOUR MEALS TWICE DAILY.   3. Avoid reflux triggers.    4. STRICTLY FOLLOW A LOW FAT DIET. MEATS SHOULD BE BAKED, BROILED, OR BOILED. AVOID FRIED FOODS AND FAST FOOD.   5. ADD BACLOFEN 30 MINS PRIOR TO BREAKFAST, & LUNCH. AT A MINIMUM TAKE BACLOFEN BEFORE BREAKFAST AND LUNCH. BACLOFEN MAY CAUSE FATIGUE, NAUSEA, DIZZINESS, OR CONSTIPATION.   6. USE PEPCID OR TAGAMET FOR BREAKTHROUGH HEARTBURN/REFLUX.  PLEASE CALL IN ONE MONTH WITH AN UPDATE. FOLLOW UP IN 2 MOS.

## 2020-02-03 NOTE — Patient Instructions (Addendum)
TO CONTROL THE MINTY TASTE WHICH IS REFLUX:    1. DRINK WATER TO KEEP YOUR URINE LIGHT YELLOW.    2. CONTINUE OMEPRAZOLE.  TAKE 30 MINUTES PRIOR TO YOUR MEALS TWICE DAILY.    3. Avoid reflux triggers.     4. STRICTLY FOLLOW A LOW FAT DIET. MEATS SHOULD BE BAKED, BROILED, OR BOILED. AVOID FRIED FOODS AND FAST FOOD.    5. ADD BACLOFEN 30 MINS PRIOR TO BREAKFAST, & LUNCH. AT A MINIMUM TAKE BACLOFEN BEFORE BREAKFAST AND LUNCH. BACLOFEN MAY CAUSE FATIGUE, NAUSEA, DIZZINESS, OR CONSTIPATION.    6. USE PEPCID OR TAGAMET FOR BREAKTHROUGH HEARTBURN/REFLUX.  TO REDUCE BLOATING, BELCHING, AND FLATULENCE,  ADD AUGMENTIN TWICE DAILY FOR 5 DAYS. IT MAY CAUSE ABDOMINAL PAIN, NAUSEA, VOMITING, RASH, OR EXPLOSIVE DIARRHEA.  PLEASE CALL IN ONE MONTH WITH AN UPDATE.  FOLLOW UP IN 2 MOS.

## 2020-02-03 NOTE — Assessment & Plan Note (Signed)
SYMPTOMS NOT IDEALLY CONTROLLED BUT IMPROVED.  TO REDUCE BLOATING, BELCHING, AND FLATULENCE,  ADD AUGMENTIN TWICE DAILY FOR 5 DAYS. IT MAY CAUSE ABDOMINAL PAIN, NAUSEA, VOMITING, RASH, OR EXPLOSIVE DIARRHEA. PLEASE CALL IN ONE MONTH FOR AN UPDATE. FOLLOW UP IN 2 MOS.

## 2020-02-03 NOTE — Progress Notes (Signed)
Subjective:    Patient ID: Carla Wise, female    DOB: 1966-02-24, 54 y.o.   MRN: [id number]  Kathyrn Drown, MD  HPI  SYMPTOMS COME AND GO. STILL HAS MINTY TASTE IN BACK OF THROAT. HEARTBURN A LITTLE. 5-6 CIGS A DAY. WORK IS ALRIGHT(10-12 6-7 DAYS A WEEK). CLEANS HOUSES AND OFFICES. BMs: OK APPETITE: COMES AND GOES. GOES FROM 6 AM TO 10 AT NIGHT. DRINKS ONE MT DEW A DAY. AUGMENTIN HELPED SOME. CALMED IT DOWN.   PT DENIES FEVER, CHILLS, HEMATOCHEZIA, HEMATEMESIS, nausea, vomiting, melena, diarrhea, CHEST PAIN, SHORTNESS OF BREATH,  CHANGE IN BOWEL IN HABITS, constipation, abdominal pain, problems swallowing, problems with sedation, heartburn or indigestion.  Past Medical History:  Diagnosis Date  . Constipation   . Gastric adenoma 02/2017  . PONV (postoperative nausea and vomiting)    Past Surgical History:  Procedure Laterality Date  . BIOPSY  01/30/2019   Procedure: BIOPSY;  Surgeon: Danie Binder, MD;  Location: [facility];  Service: Endoscopy;;  . ESOPHAGOGASTRODUODENOSCOPY N/A 03/01/2017   Dr. Oneida Alar: benign-appearing peptic stricture s/p dilation, gastric polyp (tubular adenoma), gastritis, duodenal diverticulm  . ESOPHAGOGASTRODUODENOSCOPY N/A 01/30/2019   Procedure: ESOPHAGOGASTRODUODENOSCOPY (EGD);  Surgeon: Danie Binder, MD;  Location: [facility];  Service: Endoscopy;  Laterality: N/A;  8:30am  . EXAM UNDER ANESTHESIA WITH MANIPULATION OF SHOULDER Left 06/06/2017   Procedure: EXAM UNDER ANESTHESIA WITH MANIPULATION OF SHOULDER;  Surgeon: Carole Civil, MD;  Location: [facility];  Service: Orthopedics;  Laterality: Left;  full relaxation  . FOOT SURGERY Left 2003   hammer toe- great toe  . KNEE ARTHROSCOPY W/ MENISCECTOMY     left knee  . SAVORY DILATION N/A 03/01/2017   Procedure: SAVORY DILATION;  Surgeon: Danie Binder, MD;  Location: [facility];  Service: Endoscopy;  Laterality: N/A;   Allergies  Allergen Reactions  . Azithromycin     Patient gets  worst whenever she takes a Z-Pak  . Levaquin [Levofloxacin In D5w]     Makes symptoms worse   Current Outpatient Medications  Medication Sig    . ALPRAZolam (XANAX) 0.5 MG tablet TAKE 1/2-1 TABLET BY MOUTH AT BEDTIME AS NEEDED.    Marland Kitchen amitriptyline (ELAVIL) 25 MG tablet 2 PO QHS    . omeprazole (PRILOSEC) 20 MG capsule 1 PO 30 MINS PRIOR TO MEALS TWICE DAILY    . traZODone (DESYREL) 50 MG tablet Take 0.5-1 tablets (25-50 mg total) by mouth at bedtime as needed for sleep.    . valACYclovir (VALTREX) 500 MG tablet Take 500-1,000 mg by mouth See admin instructions. Take 1000 mg at first onset of fever blisters, then take 500 mg daily for 3 days    .       Review of Systems PER HPI OTHERWISE ALL SYSTEMS ARE NEGATIVE.    Objective:   Physical Exam Constitutional:      General: She is not in acute distress.    Appearance: Normal appearance.  HENT:     Mouth/Throat:     Comments: MASK IN PLACE Eyes:     General: No scleral icterus.    Pupils: Pupils are equal, round, and reactive to light.  Cardiovascular:     Rate and Rhythm: Normal rate and regular rhythm.     Pulses: Normal pulses.     Heart sounds: Normal heart sounds.  Pulmonary:     Effort: Pulmonary effort is normal.     Breath sounds: Normal breath sounds.  Abdominal:     General: Bowel sounds are normal.     Palpations: Abdomen is soft.     Tenderness: There is no abdominal tenderness.  Musculoskeletal:     Cervical back: Normal range of motion.     Right lower leg: No edema.     Left lower leg: No edema.  Lymphadenopathy:     Cervical: No cervical adenopathy.  Skin:    General: Skin is warm and dry.  Neurological:     Mental Status: She is alert and oriented to person, place, and time.     Comments: NO  NEW FOCAL DEFICITS  Psychiatric:        Mood and Affect: Mood normal.     Comments: NORMAL AFFECT       Assessment & Plan:

## 2020-02-04 NOTE — Progress Notes (Signed)
CC'ED TO PCP 

## 2020-02-23 ENCOUNTER — Encounter: Payer: Self-pay | Admitting: Family Medicine

## 2020-02-23 ENCOUNTER — Ambulatory Visit (INDEPENDENT_AMBULATORY_CARE_PROVIDER_SITE_OTHER): Payer: BC Managed Care – PPO | Admitting: Family Medicine

## 2020-02-23 ENCOUNTER — Other Ambulatory Visit: Payer: Self-pay

## 2020-02-23 DIAGNOSIS — G4700 Insomnia, unspecified: Secondary | ICD-10-CM | POA: Diagnosis not present

## 2020-02-23 DIAGNOSIS — F419 Anxiety disorder, unspecified: Secondary | ICD-10-CM | POA: Diagnosis not present

## 2020-02-23 MED ORDER — ZOLPIDEM TARTRATE 5 MG PO TABS: ORAL_TABLET | ORAL | 0 refills | Status: DC

## 2020-02-23 NOTE — Progress Notes (Signed)
   Subjective:    Patient ID: Carla Wise, female    DOB: 1966-11-30, 54 y.o.   MRN: [id number]  Insomnia Primary symptoms: difficulty falling asleep.  The problem occurs nightly. Past treatments include meditation (Alprazolam 0.5mg ; only takes on Saturday nights). The treatment provided no relief. Typical bedtime:  8-10 P.M. (9:30 pm to get up at 5:30 am).   Patient states at times feeling stressed working 2 jobs denies being depressed states she is always on the go can slow down has a difficult time calming her self down.  Denies any major setbacks or problems Pt is currently working 2 jobs.   Virtual Visit via Video Note  I connected with Carla Wise on 02/23/20 at  1:10 PM EDT by a video enabled telemedicine application and verified that I am speaking with the correct person using two identifiers.  Location: Patient: home Provider: office   I discussed the limitations of evaluation and management by telemedicine and the availability of in person appointments. The patient expressed understanding and agreed to proceed.  History of Present Illness:    Observations/Objective:   Assessment and Plan:   Follow Up Instructions:    I discussed the assessment and treatment plan with the patient. The patient was provided an opportunity to ask questions and all were answered. The patient agreed with the plan and demonstrated an understanding of the instructions.   The patient was advised to call back or seek an in-person evaluation if the symptoms worsen or if the condition fails to improve as anticipated.  I provided 20 minutes of non-face-to-face time during this encounter.       Review of Systems  Psychiatric/Behavioral: The patient has insomnia.        Objective:   Physical Exam   Patient had virtual visit Appears to be in no distress Atraumatic Neuro able to relate and oriented No apparent resp distress Color normal      Assessment & Plan:  1.  Insomnia, unspecified type To some degree the patient is having significant insomnia and Xanax alone not doing enough trazodone did not help her melatonin did not help her we will use Ambien but she needs to give at least 8 hours of sleep with the medication with follow-up scheduled I told her if she chooses to use it a few nights a week take it near 8:30 PM go to bed by 9 PM no alcohol no driving and it should be worn off by the time she gets up at 530  2. Anxiety Patient will fill out PHQ-9 and GAD-7 as well as for these back to [place] She is on Elavil so therefore SSRIs would not be a good choice but potentially Wellbutrin would be hold off on deciding until we see these forms Wellness exam later this year colonoscopy recommended later this year

## 2020-02-28 ENCOUNTER — Telehealth: Payer: Self-pay | Admitting: Family Medicine

## 2020-02-28 NOTE — Telephone Encounter (Signed)
Nurses I reviewed over the Paul 9 which totaled out at 52 Also the GAD-7 also total to 19 (Patient states not suicidal)  Based on this he is suffering with depression and anxiety based upon her current medications Wellbutrin would be a reasonable choice to try  I reviewed over the patient's history she has no history of seizures Please confirm this with the patient (Cannot use Wellbutrin if history of seizures)  So if the patient is open to trying Wellbutrin SR 150 mg 1 twice daily #60 with 2 refills with a follow-up office visit either virtual or in person in approximately 3 to 4 weeks.  Please tell the patient it can take up to 4 to 6 weeks to see improvement with the medication.

## 2020-02-29 MED ORDER — BUSPIRONE HCL 10 MG PO TABS: 10.0000 mg | ORAL_TABLET | Freq: Three times a day (TID) | ORAL | 3 refills | Status: DC

## 2020-02-29 NOTE — Telephone Encounter (Signed)
Prescription sent electronically to pharmacy. Patient notified. 

## 2020-02-29 NOTE — Telephone Encounter (Signed)
BuSpar 10 mg, 1 taken 3 times daily, #90, 3 refills

## 2020-02-29 NOTE — Telephone Encounter (Signed)
Patient stated she had seizures when she was young but her last one was when she was 74 or 54 years old- Please advise

## 2020-02-29 NOTE — Telephone Encounter (Signed)
Given her history of seizures as a child Wellbutrin would not be a good choice of medications because of the risk of seizures with the medicine.  It would be contraindicated.  So serotonin reuptake inhibitors could help with feelings of being anxious and nervous as well as feeling down but these medications do not get along with Elavil.  So if she was interested in starting a serotonin reuptake inhibitor we would have to come off of the Elavil.  Other option would be low-dose BuSpar which is a nonaddicting antianxiety pill that is taken 2-3 times per day on a daily basis to help keep anxiousness under control  Please talk with the patient see what she would like to try/consider

## 2020-02-29 NOTE — Addendum Note (Signed)
Addended by: [name] on: 02/29/2020 10:36 AM   Modules accepted: Orders

## 2020-02-29 NOTE — Telephone Encounter (Signed)
Patient states she is willing to try the Buspar to start with - patient also will pick up the ambien today to see if it helps with her sleep as well

## 2020-03-07 ENCOUNTER — Telehealth: Payer: Self-pay | Admitting: *Deleted

## 2020-03-07 NOTE — Telephone Encounter (Signed)
Patient called in. She reports she still having a "minty" feeling in her chest. It is not any better.

## 2020-03-08 NOTE — Telephone Encounter (Signed)
CAlled pt lmom

## 2020-03-08 NOTE — Telephone Encounter (Signed)
PLEASE CALL PT. IF HER SYMPTOMS CAN'T BE CONTROLLED WITH MEDS SHE WILL NEED TO CONSIDER REFLUX SURGERY. PRIOR TO AN EVALUATION FOR REFLUX SURGERY SHE WILL NEED THE FOLLOWING: 1. GASTRIC EMPTYING STUDY,  Dx: NAUSEA AND 2. [place] STUDY/IMPEDANCE/MANOMETRY IN [place], Dx: UNCONTROLLED GERD.   SHE SHOULD INCREASE BACLOFEN TO 10 MG AT 8A, 12N, 4P, AND 8P. IT CAN CAUSE DIZZINESS. PLEASE CALL WITH QUESTIONS OR CONCERNS.

## 2020-03-08 NOTE — Telephone Encounter (Signed)
Pt called and stated she was notified to call back in 4 weeks to follow up and see how she is doing. She stated she is doing the same and nothing she is doing or taking is helping including the omeprazole and BACLOFEN 30 MINS PRIOR TO BREAKFAST, & LUNCH. AT A MINIMUM TAKE BACLOFEN BEFORE BREAKFAST AND LUNCH.   She has also used  PEPCID OR TAGAMET but is still having problems.pt wants to know if there is something else she can take or does she need to be seen sooner than april

## 2020-03-09 NOTE — Telephone Encounter (Signed)
Called pt. Verified dob and notified her IF HER SYMPTOMS CAN'T BE CONTROLLED WITH MEDS SHE WILL NEED TO CONSIDER REFLUX SURGERY. PRIOR TO AN EVALUATION FOR REFLUX SURGERY SHE WILL NEED THE FOLLOWING: 1. GASTRIC EMPTYING STUDY,  Dx: NAUSEA AND 2. [place] STUDY/IMPEDANCE/MANOMETRY IN [place], Dx: UNCONTROLLED GERD. And SHE SHOULD INCREASE BACLOFEN TO 10 MG AT 8A, 12N, 4P, AND 8P. IT CAN CAUSE DIZZINESS. PLEASE CALL WITH QUESTIONS OR CONCERNS. Pt stated she would increase her baclofen and when she comes for her appt in April she will notify us if this helped. If not she will concider doing procedure.

## 2020-03-09 NOTE — Telephone Encounter (Signed)
REVIEWED-NO ADDITIONAL RECOMMENDATIONS. 

## 2020-03-29 ENCOUNTER — Other Ambulatory Visit: Payer: Self-pay

## 2020-03-29 ENCOUNTER — Ambulatory Visit: Payer: BC Managed Care – PPO | Admitting: Family Medicine

## 2020-03-29 VITALS — BP 122/82 | Temp 97.6°F | Ht 62.0 in | Wt 109.2 lb

## 2020-03-29 DIAGNOSIS — H8393 Unspecified disease of inner ear, bilateral: Secondary | ICD-10-CM

## 2020-03-29 MED ORDER — MECLIZINE HCL 25 MG PO TABS: 25.0000 mg | ORAL_TABLET | Freq: Three times a day (TID) | ORAL | 1 refills | Status: DC | PRN

## 2020-03-29 MED ORDER — ZOLPIDEM TARTRATE 5 MG PO TABS: ORAL_TABLET | ORAL | 2 refills | Status: DC

## 2020-03-29 NOTE — Progress Notes (Signed)
   Subjective:    Patient ID: Carla Wise, female    DOB: 1966/10/14, 54 y.o.   MRN: [id number]  HPI   Patient woke up with vertigo yesterday- rooms spinning causing nausea-worse when leans over or gets up fast. Started a couple dasy ago Worse with movements No headache No illness Feels off balance Move slow Denies any unilateral numbness weakness denies double vision denies severe headache  Review of Systems Please see above    Objective:   Physical Exam Negative nystagmus Wavers on Romberg Finger-to-nose normal Pupils responsive light optic discs sharp Lungs clear respiratory rate normal Heart regular no murmurs       Assessment & Plan:  Vertigo Meclizine 3 times daily as needed caution drowsiness infrequent use recommended Epley maneuver was described plus also gave her a printout regarding this Follow-up if progressive troubles If not gone away in the next 7 to 10 days then next step would be referral for further evaluation with ENT and possible physical therapy

## 2020-03-30 ENCOUNTER — Ambulatory Visit: Payer: BC Managed Care – PPO | Admitting: Gastroenterology

## 2020-03-30 ENCOUNTER — Other Ambulatory Visit: Payer: Self-pay | Admitting: *Deleted

## 2020-03-30 ENCOUNTER — Encounter: Payer: Self-pay | Admitting: Gastroenterology

## 2020-03-30 DIAGNOSIS — K219 Gastro-esophageal reflux disease without esophagitis: Secondary | ICD-10-CM

## 2020-03-30 DIAGNOSIS — R110 Nausea: Secondary | ICD-10-CM

## 2020-03-30 NOTE — Progress Notes (Signed)
Subjective:    Patient ID: Carla Wise, female    DOB: [birth year], 54 y.o.   MRN: [id number]   Kathyrn Drown, MD  HPI Has vertigo RIGHT NOW SINCE MON AM. Minty taste still there. Not as bad when she doesn't eat. HEARTBURN NOT AS BAD. STILL MAY EXPERIENCE BREAKTHROUGH:2-3X/WEEK. FOUR TIMES A DAY WAS TOO MUCH(COULDN'T FUNCTION). CAN TOLERATE BACLOFEN AT 8A, 12N, AND PRIOR TO SUPPER. STILL WORKING 7 DAYS A WEEK AND CLEANING. FEELS NAUSEATED DUE TO VERTIGO.BMs: GOOD.  SMOKES 5-6 CIGS A DAY.  PT DENIES FEVER, CHILLS, HEMATOCHEZIA, HEMATEMESIS, vomiting, melena, diarrhea, CHEST PAIN, SHORTNESS OF BREATH, CHANGE IN BOWEL IN HABITS, constipation, abdominal pain, OR problems swallowing.  Past Medical History:  Diagnosis Date  . Constipation   . Gastric adenoma 02/2017  . PONV (postoperative nausea and vomiting)    Past Surgical History:  Procedure Laterality Date  . BIOPSY  01/30/2019   Procedure: BIOPSY;  Surgeon: Danie Binder, MD;  [facility];  Service: Endoscopy;;  . ESOPHAGOGASTRODUODENOSCOPY N/A 03/01/2017   Dr. Oneida Alar: benign-appearing peptic stricture s/p dilation, gastric polyp (tubular adenoma), gastritis, duodenal diverticulm  . ESOPHAGOGASTRODUODENOSCOPY N/A 01/30/2019   Procedure: ESOPHAGOGASTRODUODENOSCOPY (EGD);  Surgeon: Danie Binder, MD;  [facility];  Service: Endoscopy;  Laterality: N/A;  8:30am  . EXAM UNDER ANESTHESIA WITH MANIPULATION OF SHOULDER Left 06/06/2017   Procedure: EXAM UNDER ANESTHESIA WITH MANIPULATION OF SHOULDER;  Surgeon: Carole Civil, MD;  Location: [facility];  Service: Orthopedics;  Laterality: Left;  full relaxation  . FOOT SURGERY Left 2003   hammer toe- great toe  . KNEE ARTHROSCOPY W/ MENISCECTOMY     left knee  . SAVORY DILATION N/A 03/01/2017   Procedure: SAVORY DILATION;  Surgeon: Danie Binder, MD;  [facility];  Service: Endoscopy;  Laterality: N/A;   Allergies  Allergen Reactions  .  Azithromycin     Patient gets worst whenever she takes a Z-Pak  . Levaquin [Levofloxacin In D5w]     Makes symptoms worse    Current Outpatient Medications  Medication Sig    . amitriptyline (ELAVIL) 25 MG tablet 2 PO QHS    . baclofen (LIORESAL) 10 MG tablet 1 PO 30 MINS PRIOR TO MEALS BID WITH OMEPRAZOLE (Patient taking differently: Take 10 mg by mouth 3 (three) times daily. 1 PO 30 MINS PRIOR TO MEALS BID WITH OMEPRAZOLE)    . busPIRone (BUSPAR) 10 MG tablet Take 1 tablet (10 mg total) by mouth 3 (three) times daily.    . meclizine (ANTIVERT) 25 MG tablet Take 1 tablet (25 mg total) by mouth 3 (three) times daily as needed for dizziness or nausea.    Marland Kitchen omeprazole (PRILOSEC) 20 MG capsule 1 PO 30 MINS PRIOR TO MEALS TWICE DAILY    . valACYclovir (VALTREX) 500 MG tablet Take 500-1,000 mg by mouth See admin instructions. Take 1000 mg at first onset of fever blisters, then take 500 mg daily for 3 days    . zolpidem (AMBIEN) 5 MG tablet Take one tablet po qhs prn. DO NOT TAKE WITH XANAX    .      Marland Kitchen       Review of Systems PER HPI OTHERWISE ALL SYSTEMS ARE NEGATIVE.     Objective:   Physical Exam Constitutional:      General: She is not in acute distress.    Appearance: Normal appearance.  HENT:     Mouth/Throat:  Comments: MASK IN PLACE Eyes:     General: No scleral icterus.    Pupils: Pupils are equal, round, and reactive to light.  Cardiovascular:     Rate and Rhythm: Normal rate and regular rhythm.     Pulses: Normal pulses.     Heart sounds: Normal heart sounds.  Pulmonary:     Effort: Pulmonary effort is normal.     Breath sounds: Normal breath sounds.  Abdominal:     General: Bowel sounds are normal.     Palpations: Abdomen is soft.     Tenderness: There is no abdominal tenderness.  Musculoskeletal:     Cervical back: Normal range of motion.     Right lower leg: No edema.     Left lower leg: No edema.  Lymphadenopathy:     Cervical: No cervical adenopathy.    Skin:    General: Skin is warm and dry.  Neurological:     Mental Status: She is alert and oriented to person, place, and time.     Comments: NO  NEW FOCAL DEFICITS  Psychiatric:        Mood and Affect: Mood normal.     Comments: NORMAL AFFECT       Assessment & Plan:

## 2020-03-30 NOTE — Patient Instructions (Addendum)
DRINK WATER TO KEEP YOUR URINE LIGHT YELLOW.  AVOID REFLUX TRIGGERS. SEE INFO BELOW.  CONTINUE Omeprazole and Baclofen.   COMPLETE GASTRIC EMPTYING IN ONE MONTH.  I WILL FIND OUT ABOUT THE REFLUX PROCEDURE CALLED Transoral incisionless fundoplication (TIF), WHICH is an advanced endoscopy procedure that provides relief from acid reflux (heartburn) symptoms associated with chronic gastroesophageal reflux disease (GERD).  FOLLOW UP IN 6 MOS.   Lifestyle and home remedies TO CONTROL HEARTBURN You may eliminate or reduce the frequency of heartburn by making the following lifestyle changes:  . Control your weight. Being overweight is a major risk factor for heartburn and GERD. Excess pounds put pressure on your abdomen, pushing up your stomach and causing acid to back up into your esophagus.   . Eat smaller meals. 4 TO 6 MEALS A DAY. This reduces pressure on the lower esophageal sphincter, helping to prevent the valve from opening and acid from washing back into your esophagus.   Dolphus Jenny your belt. Clothes that fit tightly around your waist put pressure on your abdomen and the lower esophageal sphincter.   . Eliminate heartburn triggers. Everyone has specific triggers. Common triggers such as fatty or fried foods, spicy food, tomato sauce, carbonated beverages, alcohol, chocolate, mint, garlic, onion, caffeine and nicotine may make heartburn worse.   Marland Kitchen Avoid stooping or bending. Tying your shoes is OK. Bending over for longer periods to weed your garden isn't, especially soon after eating.   . Don't lie down after a meal. Wait at least three to four hours after eating before going to bed, and don't lie down right after eating.   Alternative medicine . Several home remedies exist for treating GERD, but they provide only temporary relief. They include drinking baking soda (sodium bicarbonate) added to water or drinking other fluids such as baking soda mixed with cream of tartar and  water. . Although these liquids create temporary relief by neutralizing, washing away or buffering acids, eventually they aggravate the situation by adding gas and fluid to your stomach, increasing pressure and causing more acid reflux. Further, adding more sodium to your diet may increase your blood pressure and add stress to your heart, and excessive bicarbonate ingestion can alter the acid-base balance in your body.

## 2020-03-30 NOTE — Assessment & Plan Note (Signed)
SYMPTOMS NOT IDEALLY CONTROLLED ON [facility].  DRINK WATER TO KEEP YOUR URINE LIGHT YELLOW. AVOID REFLUX TRIGGERS.  HANDOUT GIVEN. CONTINUE Omeprazole and Baclofen. COMPLETE GES. I WILL FIND OUT ABOUT THE REFLUX PROCEDURE CALLED Transoral incisionless fundoplication (TIF), WHICH is an advanced endoscopy procedure that provides relief from acid reflux (heartburn) symptoms associated with chronic gastroesophageal reflux disease (GERD). FOLLOW UP IN 6 MOS.

## 2020-03-31 ENCOUNTER — Telehealth: Payer: Self-pay | Admitting: *Deleted

## 2020-03-31 ENCOUNTER — Encounter: Payer: Self-pay | Admitting: Gastroenterology

## 2020-03-31 ENCOUNTER — Telehealth: Payer: Self-pay | Admitting: Gastroenterology

## 2020-03-31 DIAGNOSIS — K219 Gastro-esophageal reflux disease without esophagitis: Secondary | ICD-10-CM

## 2020-03-31 NOTE — Telephone Encounter (Signed)
-----   Message from [place], DO sent at 03/31/2020  7:59 AM EDT ----- Hi Marlissa Emerick,  Yes, I perform TIF procedures for reflux and small (<2 cm) hiatal hernias. I reviewed the images from the previous EGD x2 on this patient and would be happy to meet with her anytime if she would like to discuss the pros/cons of TIF and whether or not she is an appropriate candidate.   And as Chester Holstein pointed out, I am happy to see any patients for possible TIF for either refractory reflux or if reflux controlled with PPI but either have ADR to the med or just want to stop the med altogether. We can generally get patients off PPIs approx 85-90% of the time.  Some of the few contraindications that would preclude TIF include: BMI >35 (they do better with bariatric surgery, but there are emerging data on this population), EoE, esophageal motility d/o, dysplastic or long segment Barrett's, gastroparesis.   Would you like my office to reach out to this patient?   Thanks!  Vito  ----- Message ----- From: Irving Copas., MD Sent: 03/30/2020   4:42 PM EDT To: Danie Binder, MD, Vito Cirigliano V, DO  Nicholi Ghuman, Good afternoon to you as well. Thanks for reaching out. I do not perform TIF, but Vito (Dr. Bryan Lemma does). I put him on here, but I'm sure he would be happy to talk with you about this patient and any other patients that you have that could find some benefit from TIF. All the best. GM ----- Message ----- From: Danie Binder, MD Sent: 03/30/2020   3:52 PM EDT To: Irving Copas., MD  Good afternoon!  Do you or anyone else in your practice perform transoral incisionless fundoplication for  uncontrolled GERD?  Thanks, [facility]

## 2020-03-31 NOTE — Telephone Encounter (Addendum)
PT NEEDS REFERRAL TO SEE DR. VITO CIRIGLIANO, [id number] GERD, EVALUATE FOR TIF. SCHEDULE AFTER  AFTER MAY 17(GES APPT). Called patient TO DISCUSS PLAN. LVM-CALL [phone] TO DISCUSS.

## 2020-03-31 NOTE — Telephone Encounter (Signed)
Referral placed.

## 2020-03-31 NOTE — Progress Notes (Signed)
Cc'ed to pcp °

## 2020-03-31 NOTE — Addendum Note (Signed)
Addended by: Cheron Every on: 03/31/2020 03:27 PM   Modules accepted: Orders

## 2020-03-31 NOTE — Telephone Encounter (Signed)
GES scheduled for 5/17 at 8:00am, arrival 7:45am, npo midnight, no stomach meds after midnight  LMOVM. Letter mailed.  Checked AIM and no PA is required

## 2020-04-01 ENCOUNTER — Encounter: Payer: Self-pay | Admitting: Gastroenterology

## 2020-04-25 ENCOUNTER — Other Ambulatory Visit: Payer: Self-pay

## 2020-04-25 ENCOUNTER — Encounter (HOSPITAL_COMMUNITY): Payer: Self-pay

## 2020-04-25 ENCOUNTER — Encounter (HOSPITAL_COMMUNITY)
Admission: RE | Admit: 2020-04-25 | Discharge: 2020-04-25 | Disposition: A | Discharge status: Home / Self Care | Payer: BC Managed Care – PPO | Source: Ambulatory Visit | Attending: Gastroenterology | Admitting: Gastroenterology

## 2020-04-25 DIAGNOSIS — K219 Gastro-esophageal reflux disease without esophagitis: Secondary | ICD-10-CM | POA: Insufficient documentation

## 2020-04-25 DIAGNOSIS — R110 Nausea: Secondary | ICD-10-CM | POA: Diagnosis not present

## 2020-04-25 MED ORDER — TECHNETIUM TC 99M SULFUR COLLOID
2.0000 | Freq: Once | INTRAVENOUS | Status: AC | PRN
  Administered 2020-04-25: 1.97 via ORAL

## 2020-05-16 ENCOUNTER — Encounter: Payer: Self-pay | Admitting: Gastroenterology

## 2020-05-16 ENCOUNTER — Ambulatory Visit: Payer: BC Managed Care – PPO | Admitting: Gastroenterology

## 2020-05-16 VITALS — BP 110/74 | HR 93 | Temp 98.0°F | Ht 62.0 in | Wt 109.1 lb

## 2020-05-16 DIAGNOSIS — R12 Heartburn: Secondary | ICD-10-CM | POA: Diagnosis not present

## 2020-05-16 DIAGNOSIS — R142 Eructation: Secondary | ICD-10-CM | POA: Diagnosis not present

## 2020-05-16 DIAGNOSIS — Z01818 Encounter for other preprocedural examination: Secondary | ICD-10-CM

## 2020-05-16 DIAGNOSIS — R1310 Dysphagia, unspecified: Secondary | ICD-10-CM

## 2020-05-16 DIAGNOSIS — Z5320 Procedure and treatment not carried out because of patient's decision for unspecified reasons: Secondary | ICD-10-CM

## 2020-05-16 DIAGNOSIS — K222 Esophageal obstruction: Secondary | ICD-10-CM | POA: Diagnosis not present

## 2020-05-16 DIAGNOSIS — R1319 Other dysphagia: Secondary | ICD-10-CM

## 2020-05-16 NOTE — Patient Instructions (Signed)
If you are age 54 or older, your body mass index should be between 23-30. Your Body mass index is 19.96 kg/m. If this is out of the aforementioned range listed, please consider follow up with your Primary Care Provider.  If you are age 4 or younger, your body mass index should be between 19-25. Your Body mass index is 19.96 kg/m. If this is out of the aformentioned range listed, please consider follow up with your Primary Care Provider.   You have been scheduled for an endoscopy. Please follow written instructions given to you at your visit today. If you use inhalers (even only as needed), please bring them with you on the day of your procedure. Your physician has requested that you go to www.startemmi.com and enter the access code given to you at your visit today. This web site gives a general overview about your procedure. However, you should still follow specific instructions given to you by our office regarding your preparation for the procedure.  Thank you for choosing me and [facility].  Vito Cirigliano, DO

## 2020-05-16 NOTE — Progress Notes (Signed)
Chief Complaint: GERD, evaluation for antireflux surgery  Referring Provider:     Orlena Sheldon, MD   HPI:    Carla Wise is a 54 y.o. female with a longstanding history of GERD, referred to me by Dr. Oneida Alar for evaluation of possible Transoral Incisionless Fundoplication (TIF) with a goal to stop or significantly reduce acid suppression therapy.  She has a history of reflux for the last few years, but largely atypical symptoms.  Was initially started on medical management with Prilosec 20 mg bid, then added amitriptyline (10 mg initially, then increase to 25 mg qhs)in  07/2020 and finally added baclofen bid (started 02/03/2020).  Only eats 1 meal/day, so she only takes the Prilosec and baclofen daily.  Index symptoms are rare heartburn, but no regurgitation.  +belching.  Also describes a "mint/cooling feeling" in the mid sternum, which is actually her most bothersome symptom. +dysphagia with solids, which rsponded to EGD with dil in 2018. No odynophagia.   GERD history: -Index symptoms: Heartburn, belching (post prandial) -Medications trialed: Pepcid OTC, Prilosec, Baclofen, Elavil -Current medications: Prilosec 20 mg twice daily (only takes 1/day), baclofen bid, amitryptiline 25 mg qhs -Complications: Peptic stricture (dilated 2018)  GERD evaluation: -Last EGD: 01/30/2019 -Barium esophagram: None -Esophageal Manometry: None -pH/Impedance: None -GES (04/25/2020): Normal  GERD-HRQL Questionnaire score: 19/50 (on PPI)  Endoscopic history: -EGD (01/30/2019, Dr. Oneida Alar): Benign lower esophageal stenosis, 5 mm benign gastric hyperplastic polyp, gastritis -EGD (03/01/2017, Dr. Oneida Alar): Moderate lower esophageal peptic stricture dilated with 17 mm Savary, 5 mm gastric tubular adenoma, gastritis, 6 mm duodenal diverticulum  No previous colonoscopy.  Declines any colon cancer screening at this time.  Past Medical History:  Diagnosis Date  . Constipation   . Gastric  adenoma 02/2017  . PONV (postoperative nausea and vomiting)      Past Surgical History:  Procedure Laterality Date  . BIOPSY  01/30/2019   Procedure: BIOPSY;  Surgeon: Danie Binder, MD;  Location: [facility];  Service: Endoscopy;;  . ESOPHAGOGASTRODUODENOSCOPY N/A 03/01/2017   Dr. Oneida Alar: benign-appearing peptic stricture s/p dilation, gastric polyp (tubular adenoma), gastritis, duodenal diverticulm  . ESOPHAGOGASTRODUODENOSCOPY N/A 01/30/2019   Procedure: ESOPHAGOGASTRODUODENOSCOPY (EGD);  Surgeon: Danie Binder, MD;  Location: [facility];  Service: Endoscopy;  Laterality: N/A;  8:30am  . EXAM UNDER ANESTHESIA WITH MANIPULATION OF SHOULDER Left 06/06/2017   Procedure: EXAM UNDER ANESTHESIA WITH MANIPULATION OF SHOULDER;  Surgeon: Carole Civil, MD;  Location: [facility];  Service: Orthopedics;  Laterality: Left;  full relaxation  . FOOT SURGERY Left 2003   hammer toe- great toe  . KNEE ARTHROSCOPY W/ MENISCECTOMY     left knee  . SAVORY DILATION N/A 03/01/2017   Procedure: SAVORY DILATION;  Surgeon: Danie Binder, MD;  Location: [facility];  Service: Endoscopy;  Laterality: N/A;   Family History  Problem Relation Age of Onset  . Heart disease Other   . Cancer Other   . Colon cancer Neg Hx   . Esophageal cancer Neg Hx    Social History   Tobacco Use  . Smoking status: Current Every Day Smoker    Packs/day: 0.50    Years: 25.00    Pack years: 12.50    Types: Cigarettes  . Smokeless tobacco: Never Used  . Tobacco comment: Smokes 3 cigs daily  Substance Use Topics  . Alcohol use: No  . Drug use: No   Current Outpatient  Medications  Medication Sig Dispense Refill  . amitriptyline (ELAVIL) 25 MG tablet 2 PO QHS 30 tablet 11  . baclofen (LIORESAL) 10 MG tablet 1 PO 30 MINS PRIOR TO MEALS BID WITH OMEPRAZOLE (Patient taking differently: Take 10 mg by mouth daily. 1 PO 30 MINS PRIOR TO MEALS BID WITH OMEPRAZOLE) 60 each 11  . busPIRone (BUSPAR) 10 MG tablet Take 1  tablet (10 mg total) by mouth 3 (three) times daily. 90 tablet 3  . meclizine (ANTIVERT) 25 MG tablet Take 1 tablet (25 mg total) by mouth 3 (three) times daily as needed for dizziness or nausea. 30 tablet 1  . omeprazole (PRILOSEC) 20 MG capsule 1 PO 30 MINS PRIOR TO MEALS TWICE DAILY (Patient taking differently: Take 20 mg by mouth daily. 1 PO 30 MINS PRIOR TO MEALS TWICE DAILY) 60 capsule 5  . valACYclovir (VALTREX) 500 MG tablet Take 500-1,000 mg by mouth See admin instructions. Take 1000 mg at first onset of fever blisters, then take 500 mg daily for 3 days    . zolpidem (AMBIEN) 5 MG tablet Take one tablet po qhs prn. DO NOT TAKE WITH XANAX 30 tablet 2   No current facility-administered medications for this visit.   Allergies  Allergen Reactions  . Azithromycin     Patient gets worst whenever she takes a Z-Pak  . Levaquin [Levofloxacin In D5w]     Makes symptoms worse     Review of Systems: All systems reviewed and negative except where noted in HPI.     Physical Exam:    Wt Readings from Last 3 Encounters:  05/16/20 109 lb 2 oz (49.5 kg)  03/30/20 109 lb 9.6 oz (49.7 kg)  03/29/20 109 lb 3.2 oz (49.5 kg)    BP 110/74   Pulse 93   Temp 98 F (36.7 C)   Ht 5\' 2"  (1.575 m)   Wt 109 lb 2 oz (49.5 kg)   BMI 19.96 kg/m  Constitutional:  Pleasant, in no acute distress. Psychiatric: Normal mood and affect. Behavior is normal. EENT: Pupils normal.  Conjunctivae are normal. No scleral icterus. Neck supple. No cervical LAD. Cardiovascular: Normal rate, regular rhythm. No edema Pulmonary/chest: Effort normal and breath sounds normal. No wheezing, rales or rhonchi. Abdominal: Soft, nondistended, nontender. Bowel sounds active throughout. There are no masses palpable. No hepatomegaly. Neurological: Alert and oriented to person place and time. Skin: Skin is warm and dry. No rashes noted.   ASSESSMENT AND PLAN;   1) Heartburn 2) Increased belching 54 year old female with a  history of suspected reflux, with some clinical improvement with PPI along with addition of Elavil and baclofen.  Does have a history of peptic stricture, which is a little more convincing for underlying reflux, but symptomatology not as clear.  Discussed pathophysiology of reflux at length today, to include treatment options of medical management, antireflux surgery, etc. and will proceed as below.  -EGD to assess for erosive esophagitis, LES laxity, hiatal hernia -Okay to resume Prilosec for now -Resuming baclofen for now -Will trial off amitriptyline -If considering antireflux surgery, if no evidence of erosive esophagitis on EGD, will have low threshold for pH/impedance testing (or Bravo) to establish clear objective evidence of clinically significant reflux that would be amenable to antireflux surgery  3) Dysphagia 4) History of peptic stricture -EGD with esophageal dilation  5) Colon cancer screening declined -Patient declines CRC screening today   The indications, risks, and benefits of EGD were explained to the patient in detail.  Risks include but are not limited to bleeding, perforation, adverse reaction to medications, and cardiopulmonary compromise. Sequelae include but are not limited to the possibility of surgery, hositalization, and mortality. The patient verbalized understanding and wished to proceed. All questions answered, referred to scheduler. Further recommendations pending results of the exam.    Lavena Bullion, DO, FACG  05/16/2020, 3:36 PM   Wolfgang Phoenix, Elayne Snare, MD

## 2020-05-17 ENCOUNTER — Encounter: Payer: Self-pay | Admitting: Gastroenterology

## 2020-05-17 ENCOUNTER — Encounter: Payer: BC Managed Care – PPO | Admitting: Gastroenterology

## 2020-05-18 ENCOUNTER — Ambulatory Visit (INDEPENDENT_AMBULATORY_CARE_PROVIDER_SITE_OTHER): Payer: BC Managed Care – PPO

## 2020-05-18 ENCOUNTER — Other Ambulatory Visit: Payer: Self-pay | Admitting: Gastroenterology

## 2020-05-18 DIAGNOSIS — Z1159 Encounter for screening for other viral diseases: Secondary | ICD-10-CM

## 2020-05-19 ENCOUNTER — Telehealth: Payer: Self-pay | Admitting: Emergency Medicine

## 2020-05-19 ENCOUNTER — Encounter: Payer: Self-pay | Admitting: Certified Registered Nurse Anesthetist

## 2020-05-19 DIAGNOSIS — K219 Gastro-esophageal reflux disease without esophagitis: Secondary | ICD-10-CM

## 2020-05-19 LAB — SARS CORONAVIRUS 2 (TAT 6-24 HRS): SARS Coronavirus 2: NEGATIVE

## 2020-05-19 MED ORDER — OMEPRAZOLE 20 MG PO CPDR: DELAYED_RELEASE_CAPSULE | ORAL | 5 refills | Status: DC

## 2020-05-19 NOTE — Telephone Encounter (Signed)
Pt is requesting a refill on omeprazole 20mg  caps  Take 1 caps 30 mins before meals twice a day  Amitriptyline hcl 25mg   Take  (2) tabs by mouth at bedtime Sent into [facility]

## 2020-05-19 NOTE — Telephone Encounter (Signed)
I will refill the omeprazole.  For the Amitriptyline, the GI he saw at [facility] a few days ago wanted to trial him off Amitriptyline as part of the workup for possible GERD surgery.  Call if any questions or concerns.

## 2020-05-19 NOTE — Addendum Note (Signed)
Addended by: Gordy Levan, Jobani Sabado A on: 05/19/2020 01:21 PM   Modules accepted: Orders

## 2020-05-19 NOTE — Telephone Encounter (Signed)
Pt.notified

## 2020-05-20 ENCOUNTER — Other Ambulatory Visit: Payer: Self-pay

## 2020-05-20 ENCOUNTER — Encounter: Payer: Self-pay | Admitting: Gastroenterology

## 2020-05-20 ENCOUNTER — Ambulatory Visit (AMBULATORY_SURGERY_CENTER): Payer: BC Managed Care – PPO | Admitting: Gastroenterology

## 2020-05-20 VITALS — BP 104/69 | HR 65 | Temp 96.9°F | Resp 13 | Ht 62.0 in | Wt 109.0 lb

## 2020-05-20 DIAGNOSIS — K449 Diaphragmatic hernia without obstruction or gangrene: Secondary | ICD-10-CM

## 2020-05-20 DIAGNOSIS — K2970 Gastritis, unspecified, without bleeding: Secondary | ICD-10-CM | POA: Diagnosis not present

## 2020-05-20 DIAGNOSIS — R1310 Dysphagia, unspecified: Secondary | ICD-10-CM

## 2020-05-20 DIAGNOSIS — K219 Gastro-esophageal reflux disease without esophagitis: Secondary | ICD-10-CM

## 2020-05-20 DIAGNOSIS — K2210 Ulcer of esophagus without bleeding: Secondary | ICD-10-CM

## 2020-05-20 DIAGNOSIS — K222 Esophageal obstruction: Secondary | ICD-10-CM

## 2020-05-20 DIAGNOSIS — K2950 Unspecified chronic gastritis without bleeding: Secondary | ICD-10-CM | POA: Diagnosis not present

## 2020-05-20 DIAGNOSIS — K2270 Barrett's esophagus without dysplasia: Secondary | ICD-10-CM | POA: Diagnosis not present

## 2020-05-20 DIAGNOSIS — K228 Other specified diseases of esophagus: Secondary | ICD-10-CM | POA: Diagnosis not present

## 2020-05-20 DIAGNOSIS — R12 Heartburn: Secondary | ICD-10-CM

## 2020-05-20 HISTORY — PX: UPPER GASTROINTESTINAL ENDOSCOPY: SHX188

## 2020-05-20 MED ORDER — SODIUM CHLORIDE 0.9 % IV SOLN: 500.0000 mL | Freq: Once | INTRAVENOUS | Status: DC

## 2020-05-20 NOTE — Patient Instructions (Signed)
Impression/Recommendations:  Dilation diet handout, given to patient.  Soft diet today. Gastritis handout given to patient. Hiatal hernia handout given to patient.  Continue present medications. Await pathology results.  Further evaluation of reflux work-up for potential antireflux surgery.  Follow-up with Dr. Bryan Lemma in GI clinic after studies are complete.  YOU HAD AN ENDOSCOPIC PROCEDURE TODAY AT [facility]:   Refer to the procedure report that was given to you for any specific questions about what was found during the examination.  If the procedure report does not answer your questions, please call your gastroenterologist to clarify.  If you requested that your care partner not be given the details of your procedure findings, then the procedure report has been included in a sealed envelope for you to review at your convenience later.  YOU SHOULD EXPECT: Some feelings of bloating in the abdomen. Passage of more gas than usual.  Walking can help get rid of the air that was put into your GI tract during the procedure and reduce the bloating. If you had a lower endoscopy (such as a colonoscopy or flexible sigmoidoscopy) you may notice spotting of blood in your stool or on the toilet paper. If you underwent a bowel prep for your procedure, you may not have a normal bowel movement for a few days.  Please Note:  You might notice some irritation and congestion in your nose or some drainage.  This is from the oxygen used during your procedure.  There is no need for concern and it should clear up in a day or so.  SYMPTOMS TO REPORT IMMEDIATELY:  Following upper endoscopy (EGD)  Vomiting of blood or coffee ground material  New chest pain or pain under the shoulder blades  Painful or persistently difficult swallowing  New shortness of breath  Fever of 100F or higher  Black, tarry-looking stools  For urgent or emergent issues, a gastroenterologist can be reached at any hour  by calling [phone]. Do not use MyChart messaging for urgent concerns.    DIET:  We do recommend a small meal at first, but then you may proceed to your regular diet.  Drink plenty of fluids but you should avoid alcoholic beverages for 24 hours.  ACTIVITY:  You should plan to take it easy for the rest of today and you should NOT DRIVE or use heavy machinery until tomorrow (because of the sedation medicines used during the test).    FOLLOW UP: Our staff will call the number listed on your records 48-72 hours following your procedure to check on you and address any questions or concerns that you may have regarding the information given to you following your procedure. If we do not reach you, we will leave a message.  We will attempt to reach you two times.  During this call, we will ask if you have developed any symptoms of COVID 19. If you develop any symptoms (ie: fever, flu-like symptoms, shortness of breath, cough etc.) before then, please call [phone].  If you test positive for Covid 19 in the 2 weeks post procedure, please call and report this information to [place].    If any biopsies were taken you will be contacted by phone or by letter within the next 1-3 weeks.  Please call us at [phone] if you have not heard about the biopsies in 3 weeks.    SIGNATURES/CONFIDENTIALITY: You and/or your care partner have signed paperwork which will be entered into your electronic medical record.  These signatures attest to the fact that that the information above on your After Visit Summary has been reviewed and is understood.  Full responsibility of the confidentiality of this discharge information lies with you and/or your care-partner.

## 2020-05-20 NOTE — Op Note (Signed)
[place] Patient Name: Carla Wise Procedure Date: 05/20/2020 9:14 AM MRN: [id number] Endoscopist: Gerrit Heck , MD Age: 54 Referring MD:  Date of Birth: 1966-07-14 Gender: Female Account #: [id number] Procedure:                Upper GI endoscopy Indications:              Dysphagia, Esophageal reflux, Preoperative                            assessment for possibe antireflux surgery (TIF) Medicines:                Monitored Anesthesia Care Procedure:                Pre-Anesthesia Assessment:                           - Prior to the procedure, a History and Physical                            was performed, and patient medications and                            allergies were reviewed. The patient's tolerance of                            previous anesthesia was also reviewed. The risks                            and benefits of the procedure and the sedation                            options and risks were discussed with the patient.                            All questions were answered, and informed consent                            was obtained. Prior Anticoagulants: The patient has                            taken no previous anticoagulant or antiplatelet                            agents. ASA Grade Assessment: II - A patient with                            mild systemic disease. After reviewing the risks                            and benefits, the patient was deemed in                            satisfactory condition to undergo the procedure.  After obtaining informed consent, the endoscope was                            passed under direct vision. Throughout the                            procedure, the patient's blood pressure, pulse, and                            oxygen saturations were monitored continuously. The                            Endoscope was introduced through the mouth, and                            advanced to the  second part of duodenum. The upper                            GI endoscopy was accomplished without difficulty.                            The patient tolerated the procedure well. Scope In: Scope Out: Findings:                 One benign-appearing, intrinsic mild stenosis was                            found 38 cm from the incisors. This stenosis                            measured less than one cm (in length). The stenosis                            was traversed. A TTS dilator was passed through the                            scope. Dilation with an 18-19-20 mm balloon dilator                            was performed to 20 mm. The dilation site was                            examined and showed mild mucosal disruption.                            Biopsies were taken with a cold forceps for dual                            purpose of evaluation for histologic evidence of                            reflux along with further fracturing of the ring.  Estimated blood loss was minimal.                           Aside from a small area of very subtle reflux                            change, the Z-line was regular and was found 39 cm                            from the incisors. This was biopsied at the time of                            the above biopsies for the stenosis.                           Two superficial esophageal ulcers with no stigmata                            of recent bleeding were found 37 cm from the                            incisors. The largest lesion was 2 mm in largest                            dimension. Biopsies were taken with a cold forceps                            for histology. Estimated blood loss was minimal.                           A 1 cm sliding type hiatal hernia was present. This                            measured 2 cm in transverse width on retroflexed                            views, but with significant LES laxity.                            The gastroesophageal flap valve was visualized                            endoscopically and classified as Hill Grade III                            (minimal fold, loose to endoscope, hiatal hernia                            likely).                           Mild inflammation characterized by congestion                            (  edema) and erythema was found in the gastric                            fundus, in the gastric body and in the gastric                            antrum. Biopsies were taken with a cold forceps for                            Helicobacter pylori testing. Estimated blood loss                            was minimal.                           The duodenal bulb, first portion of the duodenum                            and second portion of the duodenum were normal. Complications:            No immediate complications. Estimated Blood Loss:     Estimated blood loss was minimal. Impression:               - Benign-appearing esophageal stenosis. Dilated.                            Biopsied.                           - Z-line regular, 39 cm from the incisors.                           - Esophageal ulcers with no stigmata of recent                            bleeding. Biopsied.                           - 1 cm axial x 2 cm transverse width hiatal hernia.                           - Gastroesophageal flap valve classified as Hill                            Grade III (minimal fold, loose to endoscope, hiatal                            hernia likely).                           - Gastritis. Biopsied.                           - Normal duodenal bulb, first portion of the  duodenum and second portion of the duodenum. Recommendation:           - Patient has a contact number available for                            emergencies. The signs and symptoms of potential                            delayed complications were discussed with the                             patient. Return to normal activities tomorrow.                            Written discharge instructions were provided to the                            patient.                           - Soft diet today.                           - Continue present medications.                           - Await pathology results.                           - Perform Esophageal manometry and ambulatory pH                            and impedance monitoring (off PPI) at appointment                            to be scheduled for further evaluation of reflux                            and as part of work-up for a potential antireflux                            surgery.                           - Follow-up with Dr. Bryan Lemma in the GI clinic                            after studies are complete. Gerrit Heck, MD 05/20/2020 9:48:26 AM

## 2020-05-20 NOTE — Progress Notes (Signed)
Temperature taken by J.B., VS taken by D.T. 

## 2020-05-24 ENCOUNTER — Telehealth: Payer: Self-pay

## 2020-05-24 NOTE — Telephone Encounter (Signed)
  Follow up Call-  Call back number 05/20/2020  Post procedure Call Back phone  # [phone]  Permission to leave phone message Yes  Some recent data might be hidden     Patient questions:  Do you have a fever, pain , or abdominal swelling? No. Pain Score  0 *  Have you tolerated food without any problems? Yes.    Have you been able to return to your normal activities? Yes.    Do you have any questions about your discharge instructions: Diet   No. Medications  No. Follow up visit  No.  Do you have questions or concerns about your Care? No.  Actions: * If pain score is 4 or above: 1. No action needed, pain <4.Have you developed a fever since your procedure? no  2.   Have you had an respiratory symptoms (SOB or cough) since your procedure? no  3.   Have you tested positive for COVID 19 since your procedure no  4.   Have you had any family members/close contacts diagnosed with the COVID 19 since your procedure?  no   If yes to any of these questions please route to Joylene John, RN and Erenest Rasher, RN

## 2020-05-25 ENCOUNTER — Other Ambulatory Visit: Payer: Self-pay

## 2020-05-31 ENCOUNTER — Other Ambulatory Visit: Payer: Self-pay | Admitting: Gastroenterology

## 2020-05-31 DIAGNOSIS — K219 Gastro-esophageal reflux disease without esophagitis: Secondary | ICD-10-CM

## 2020-05-31 DIAGNOSIS — K2970 Gastritis, unspecified, without bleeding: Secondary | ICD-10-CM

## 2020-05-31 DIAGNOSIS — K2990 Gastroduodenitis, unspecified, without bleeding: Secondary | ICD-10-CM

## 2020-06-06 ENCOUNTER — Telehealth: Payer: Self-pay | Admitting: Gastroenterology

## 2020-06-06 NOTE — Telephone Encounter (Signed)
Spoke to patient to inform her that there was nota sooner appointment for her esophageal manometry at [facility]. Patient voiced understanding.

## 2020-06-06 NOTE — Telephone Encounter (Signed)
Patient called and would like to see if there is an earlier availability for her procedure. Please advise

## 2020-07-11 DIAGNOSIS — B351 Tinea unguium: Secondary | ICD-10-CM | POA: Diagnosis not present

## 2020-07-11 DIAGNOSIS — M79674 Pain in right toe(s): Secondary | ICD-10-CM | POA: Diagnosis not present

## 2020-07-25 ENCOUNTER — Other Ambulatory Visit (HOSPITAL_COMMUNITY)
Admission: RE | Admit: 2020-07-25 | Discharge: 2020-07-25 | Disposition: A | Discharge status: Home / Self Care | Payer: BC Managed Care – PPO | Source: Ambulatory Visit | Attending: Gastroenterology | Admitting: Gastroenterology

## 2020-07-25 DIAGNOSIS — Z20822 Contact with and (suspected) exposure to covid-19: Secondary | ICD-10-CM | POA: Insufficient documentation

## 2020-07-25 DIAGNOSIS — Z01812 Encounter for preprocedural laboratory examination: Secondary | ICD-10-CM | POA: Insufficient documentation

## 2020-07-25 LAB — SARS CORONAVIRUS 2 (TAT 6-24 HRS): SARS Coronavirus 2: NEGATIVE

## 2020-07-27 ENCOUNTER — Ambulatory Visit (HOSPITAL_COMMUNITY)
Admission: RE | Admit: 2020-07-27 | Discharge: 2020-07-27 | Disposition: A | Discharge status: Home / Self Care | Payer: BC Managed Care – PPO | Attending: Gastroenterology | Admitting: Gastroenterology

## 2020-07-27 ENCOUNTER — Encounter (HOSPITAL_COMMUNITY)
Admission: RE | Disposition: A | Discharge status: Home / Self Care | Payer: Self-pay | Source: Home / Self Care | Attending: Gastroenterology

## 2020-07-27 DIAGNOSIS — K2270 Barrett's esophagus without dysplasia: Secondary | ICD-10-CM | POA: Diagnosis not present

## 2020-07-27 DIAGNOSIS — K219 Gastro-esophageal reflux disease without esophagitis: Secondary | ICD-10-CM

## 2020-07-27 DIAGNOSIS — K449 Diaphragmatic hernia without obstruction or gangrene: Secondary | ICD-10-CM | POA: Insufficient documentation

## 2020-07-27 DIAGNOSIS — Z79899 Other long term (current) drug therapy: Secondary | ICD-10-CM | POA: Diagnosis not present

## 2020-07-27 HISTORY — PX: ESOPHAGEAL MANOMETRY: SHX5429

## 2020-07-27 SURGERY — MANOMETRY, ESOPHAGUS

## 2020-07-27 MED ORDER — LIDOCAINE VISCOUS HCL 2 % MT SOLN
OROMUCOSAL | Status: AC
  Filled 2020-07-27: qty 15

## 2020-07-27 SURGICAL SUPPLY — 2 items
FACESHIELD LNG OPTICON STERILE (SAFETY) IMPLANT
GLOVE BIO SURGEON STRL SZ8 (GLOVE) ×4 IMPLANT

## 2020-07-27 NOTE — Progress Notes (Signed)
Esophageal manometry performed per protocol without complications.  Patient tolerated well. 

## 2020-07-27 NOTE — Progress Notes (Signed)
Per conversation with Dr. Bryan Lemma, patient will need 24h pH probe study in addition to manometry ordered today.  Discussed options with patient.  She is willing to have manometry today.  She will return tomorrow for the pH probe study with understanding to return on Friday to have probe removed.  Instructions given for npo 4 hours prior to arrival and to stay off of her PPI per MD.

## 2020-07-28 ENCOUNTER — Ambulatory Visit (HOSPITAL_COMMUNITY)
Admission: RE | Admit: 2020-07-28 | Discharge: 2020-07-28 | Disposition: A | Discharge status: Home / Self Care | Payer: BC Managed Care – PPO | Attending: Gastroenterology | Admitting: Gastroenterology

## 2020-07-28 ENCOUNTER — Encounter (HOSPITAL_COMMUNITY)
Admission: RE | Disposition: A | Discharge status: Home / Self Care | Payer: Self-pay | Source: Home / Self Care | Attending: Gastroenterology

## 2020-07-28 ENCOUNTER — Encounter (HOSPITAL_COMMUNITY): Payer: Self-pay | Admitting: Gastroenterology

## 2020-07-28 DIAGNOSIS — R142 Eructation: Secondary | ICD-10-CM

## 2020-07-28 DIAGNOSIS — K219 Gastro-esophageal reflux disease without esophagitis: Secondary | ICD-10-CM

## 2020-07-28 DIAGNOSIS — R12 Heartburn: Secondary | ICD-10-CM | POA: Insufficient documentation

## 2020-07-28 HISTORY — PX: 24 HOUR PH STUDY: SHX5419

## 2020-07-28 SURGERY — MONITORING, ESOPHAGEAL PH, 24 HOUR

## 2020-07-28 MED ORDER — LIDOCAINE VISCOUS HCL 2 % MT SOLN
OROMUCOSAL | Status: AC
  Filled 2020-07-28: qty 15

## 2020-07-28 NOTE — H&P (Signed)
P  Chief Complaint:    GERD   HPI:     Patient is a 54 y.o. female with a history of GERD presenting to [place] for esophageal manometry and pH/impedance testing for further work-up for antireflux surgery (TIF)   She has a history of reflux for the last few years, but largely atypical symptoms.  Was initially started on medical management with Prilosec 20 mg bid, then added amitriptyline (10 mg initially, then increase to 25 mg qhs)in  07/2020 and finally added baclofen bid (started 02/03/2020).  Only eats 1 meal/day, so she only takes the Prilosec and baclofen daily.  Index symptoms are rare heartburn, but no regurgitation.  +belching.  Also describes a "mint/cooling feeling" in the mid sternum, which is actually her most bothersome symptom. +dysphagia with solids, which rsponded to EGD with dil in 2018. No odynophagia.   GERD history: -Index symptoms: Heartburn, belching (post prandial) -Medications trialed: Pepcid OTC, Prilosec, Baclofen, Elavil -Current medications: Prilosec 20 mg twice daily (only takes 1/day), baclofen bid, amitryptiline 25 mg qhs -Complications: Peptic stricture (dilated 2018)  GERD evaluation: -Last EGD: 01/30/2019 -Barium esophagram: None -Esophageal Manometry: None -pH/Impedance: None -GES (04/25/2020): Normal  GERD-HRQL Questionnaire score: 19/50 (on PPI)  Endoscopic history: -EGD (05/2020, Dr. Bryan Lemma): Benign stricture dilated with 20 mm TTS balloon, subtle reflux esophagitis with biopsies with ultrashort segment nondysplastic Barrett's esophagus, 1 cm HH, Hill grade 3 valve, mild gastritis -EGD (01/30/2019, Dr. Oneida Alar): Benign lower esophageal stenosis, 5 mm benign gastric hyperplastic polyp, gastritis -EGD (03/01/2017, Dr. Oneida Alar): Moderate lower esophageal peptic stricture dilated with 17 mm Savary, 5 mm gastric tubular adenoma, gastritis, 6 mm duodenal diverticulum  Review of systems:     No chest pain, no SOB,  no fevers, no urinary sx   Past Medical History:  Diagnosis Date  . Constipation   . Gastric adenoma 02/2017  . GERD (gastroesophageal reflux disease)   . PONV (postoperative nausea and vomiting)     Patient's surgical history, family medical history, social history, medications and allergies were all reviewed in Epic    No current facility-administered medications for this encounter.   Current Outpatient Medications  Medication Sig Dispense Refill  . amitriptyline (ELAVIL) 25 MG tablet 2 PO QHS 30 tablet 11  . baclofen (LIORESAL) 10 MG tablet 1 PO 30 MINS PRIOR TO MEALS BID WITH OMEPRAZOLE (Patient taking differently: Take 10 mg by mouth daily. 1 PO 30 MINS PRIOR TO MEALS BID WITH OMEPRAZOLE) 60 each 11  . busPIRone (BUSPAR) 10 MG tablet Take 1 tablet (10 mg total) by mouth 3 (three) times daily. 90 tablet 3  . meclizine (ANTIVERT) 25 MG tablet Take 1 tablet (25 mg total) by mouth 3 (three) times daily as needed for dizziness or nausea. 30 tablet 1  . omeprazole (PRILOSEC) 20 MG capsule 1 PO 30 MINS PRIOR TO MEALS TWICE DAILY 60 capsule 5  . valACYclovir (VALTREX) 500 MG tablet Take 500-1,000 mg by mouth See admin instructions. Take 1000 mg at first onset of fever blisters, then take 500 mg daily for 3 days    . zolpidem (AMBIEN) 5 MG tablet Take one tablet po qhs prn. DO NOT TAKE WITH XANAX 30 tablet 2    Physical Exam:     There were no vitals taken for this visit.    IMPRESSION and PLAN:    1) GERD 2) Hiatal hernia 3) Ultrashort segment nondysplastic Barrett's esophagus  -Plan for esophageal manometry and pH/impedance testing (  on PPI) for further characterization of grade/severity of reflux and as part of ongoing work-up for possible antireflux surgery. -To follow-up in the GI clinic as scheduled to review results          Lavena Bullion ,DO, FACG 07/28/2020, 1:22 PM

## 2020-07-28 NOTE — Progress Notes (Signed)
PH probe placed per protocol at 33 cm at left nare. Patient tolerated well.  Patent verbalized understanding of written education material and use of equipment.  Patient also know to return to endo tomorrow at 1505 to have probe removed.  Report to be sent to Dr. Harl Bowie.

## 2020-07-29 ENCOUNTER — Encounter (HOSPITAL_COMMUNITY): Payer: Self-pay | Admitting: Gastroenterology

## 2020-08-02 ENCOUNTER — Ambulatory Visit: Payer: BC Managed Care – PPO | Admitting: Gastroenterology

## 2020-08-09 DIAGNOSIS — R142 Eructation: Secondary | ICD-10-CM

## 2020-08-16 ENCOUNTER — Other Ambulatory Visit: Payer: Self-pay

## 2020-08-16 ENCOUNTER — Telehealth (INDEPENDENT_AMBULATORY_CARE_PROVIDER_SITE_OTHER): Payer: BC Managed Care – PPO | Admitting: Gastroenterology

## 2020-08-16 ENCOUNTER — Encounter: Payer: Self-pay | Admitting: Gastroenterology

## 2020-08-16 VITALS — Ht 62.0 in | Wt 110.0 lb

## 2020-08-16 DIAGNOSIS — F458 Other somatoform disorders: Secondary | ICD-10-CM

## 2020-08-16 DIAGNOSIS — R1319 Other dysphagia: Secondary | ICD-10-CM

## 2020-08-16 DIAGNOSIS — K2270 Barrett's esophagus without dysplasia: Secondary | ICD-10-CM

## 2020-08-16 DIAGNOSIS — R1310 Dysphagia, unspecified: Secondary | ICD-10-CM | POA: Diagnosis not present

## 2020-08-16 DIAGNOSIS — R12 Heartburn: Secondary | ICD-10-CM | POA: Diagnosis not present

## 2020-08-16 DIAGNOSIS — R142 Eructation: Secondary | ICD-10-CM | POA: Diagnosis not present

## 2020-08-16 DIAGNOSIS — K222 Esophageal obstruction: Secondary | ICD-10-CM

## 2020-08-16 MED ORDER — BACLOFEN 10 MG PO TABS: ORAL_TABLET | ORAL | 0 refills | Status: DC

## 2020-08-16 NOTE — Progress Notes (Signed)
Chief Complaint: Discuss manometry and pH/Impedance study results   GI Hx: Carla Wise is a 54 y.o. female with a longstanding history of GERD, referred to me by Dr. Oneida Alar for evaluation of possible Transoral Incisionless Fundoplication (TIF) with a goal to stop or significantly reduce acid suppression therapy. Initially seen by me in 05/2020.   She has a history of reflux for the last few years, but largely atypical symptoms.  Was initially started on medical management with Prilosec 20 mg bid, then added amitriptyline (10 mg initially, then increase to 25 mg qhs)in  07/2020 and finally added baclofen bid (started 02/03/2020).  Only eats 1 meal/day, so she only takes the Prilosec and baclofen daily; had since stopped baclofen when I first saw her.  Index symptoms are rare heartburn, but no regurgitation.  +belching.  Also describes a "mint/cooling feeling" in the mid sternum, which is actually her most bothersome symptom. +dysphagia with solids, which rsponded to EGD with dil in 2018. No odynophagia.   GERD history: -Index symptoms: Heartburn, belching (post prandial) -Medications trialed: Pepcid OTC, Prilosec, Baclofen, Elavil -Current medications: Prilosec 20 mg twice daily (only takes 1/day), amitryptiline 25 mg qhs -Complications: Peptic stricture (dilated 2018, 2021), short segment nondysplastic Barrett's esophagus (2021)  GERD evaluation: -Last EGD: 01/30/2019 -Barium esophagram: None -Esophageal Manometry: Completed 07/2020 -pH/Impedance: Completed 07/2020 -GES (04/25/2020): Normal  GERD-HRQL Questionnaire score: 19/50 (on PPI)  Endoscopic history: -EGD (05/20/2020, Dr. Bryan Lemma): Benign stricture dilated 20 mm TTS balloon then fractured with forceps, 1 cm sliding-type HH, esophageal ulcer x2, Hill grade 3 valve, mild gastritis.  Short segment nondysplastic Barrett's Esophagus on biopsy -EGD (01/30/2019, Dr. Oneida Alar): Benign lower esophageal stenosis, 5 mm  benign gastric hyperplastic polyp, gastritis -EGD (03/01/2017, Dr. Oneida Alar): Moderate lower esophageal peptic stricture dilated with 17 mm Savary, 5 mm gastric tubular adenoma, gastritis, 6 mm duodenal diverticulum   -Esophageal Manometry (07/2020): Official report pending -pH/impedance (07/2020, off PPI): Official report pending, but preliminary report demonstrates normal DeMeester score, predominantly weakly acidic exposure, positive belching positive aerophagia with recommendation to consider baclofen.    HPI:    Due to current restrictions/limitations of in-office visits due to the COVID-19 pandemic, this scheduled clinical appointment was converted to a telehealth virtual consultation using [employer].  Patient was at work and unable to establish video link, so converted to telephone call.  -Time of medical discussion: 21 minutes -The patient did consent to this virtual visit and is aware of possible charges through their insurance for this visit.  -Names of all parties present: [name] (patient), Gerrit Heck, DO, [facility] (physician) -Patient location: Work -Physician location: Office  Carla Wise is a 54 y.o. female presentging to the Gastroenterology Clinic for procedure f/u. Reports that she took omeprazole during the study, despite holding 5 days prior, so a little difficult to interpret study results.   Has trialed baclofen in the past, but unsure if this was efficacious in the past.  Was no longer taking in the time of my initial appointment with her.  She states she otherwise feels fine today.  When she thought about everything more, she thinks she would prefer medical management rather than antireflux surgery.  Past medical history, past surgical history, social history, family history, medications, and allergies reviewed in the chart and with patient.    Past Medical History:  Diagnosis Date  . Constipation   . Gastric adenoma 02/2017  . GERD (gastroesophageal  reflux disease)   . PONV (postoperative nausea and vomiting)      Past Surgical History:  Procedure Laterality Date  . [place] STUDY N/A 07/28/2020   Procedure: [place] STUDY;  Surgeon: Lavena Bullion, DO;  Location: [facility];  Service: Gastroenterology;  Laterality: N/A;  . BIOPSY  01/30/2019   Procedure: BIOPSY;  Surgeon: Danie Binder, MD;  Location: [facility];  Service: Endoscopy;;  . ESOPHAGEAL MANOMETRY N/A 07/27/2020   Procedure: ESOPHAGEAL MANOMETRY (EM);  Surgeon: Lavena Bullion, DO;  Location: [facility];  Service: Gastroenterology;  Laterality: N/A;  . ESOPHAGOGASTRODUODENOSCOPY N/A 03/01/2017   Dr. Oneida Alar: benign-appearing peptic stricture s/p dilation, gastric polyp (tubular adenoma), gastritis, duodenal diverticulm  . ESOPHAGOGASTRODUODENOSCOPY N/A 01/30/2019   Procedure: ESOPHAGOGASTRODUODENOSCOPY (EGD);  Surgeon: Danie Binder, MD;  Location: [facility];  Service: Endoscopy;  Laterality: N/A;  8:30am  . EXAM UNDER ANESTHESIA WITH MANIPULATION OF SHOULDER Left 06/06/2017   Procedure: EXAM UNDER ANESTHESIA WITH MANIPULATION OF SHOULDER;  Surgeon: Carole Civil, MD;  Location: [facility] ORS;  Service: Orthopedics;  Laterality: Left;  full relaxation  . FOOT SURGERY Left 2003   hammer toe- great toe  . KNEE ARTHROSCOPY W/ MENISCECTOMY     left knee  . SAVORY DILATION N/A 03/01/2017   Procedure: SAVORY DILATION;  Surgeon: Danie Binder, MD;  Location: [facility];  Service: Endoscopy;  Laterality: N/A;  . UPPER GASTROINTESTINAL ENDOSCOPY     Family History  Problem Relation Age of Onset  . Heart disease Other   . Cancer Other   . Colon cancer Neg Hx   . Esophageal cancer Neg Hx   . Rectal cancer Neg Hx   . Stomach cancer Neg Hx    Social History   Tobacco Use  . Smoking status: Current Every Day Smoker    Packs/day: 0.50    Years: 25.00    Pack years: 12.50    Types: Cigarettes  . Smokeless tobacco: Never Used  . Tobacco comment:  Smokes 3 cigs daily  Vaping Use  . Vaping Use: Never used  Substance Use Topics  . Alcohol use: No  . Drug use: No   Current Outpatient Medications  Medication Sig Dispense Refill  . baclofen (LIORESAL) 10 MG tablet 1 PO 30 MINS PRIOR TO MEALS BID WITH OMEPRAZOLE 60 each 11  . omeprazole (PRILOSEC) 20 MG capsule 1 PO 30 MINS PRIOR TO MEALS TWICE DAILY 60 capsule 5  . zolpidem (AMBIEN) 5 MG tablet Take one tablet po qhs prn. DO NOT TAKE WITH XANAX (Patient taking differently: Take 5 mg by mouth as needed. Take one tablet po qhs prn. DO NOT TAKE WITH XANAX) 30 tablet 2  . amitriptyline (ELAVIL) 25 MG tablet 2 PO QHS (Patient not taking: Reported on 08/16/2020) 30 tablet 11  . busPIRone (BUSPAR) 10 MG tablet Take 1 tablet (10 mg total) by mouth 3 (three) times daily. (Patient not taking: Reported on 08/16/2020) 90 tablet 3  . meclizine (ANTIVERT) 25 MG tablet Take 1 tablet (25 mg total) by mouth 3 (three) times daily as needed for dizziness or nausea. (Patient not taking: Reported on 08/16/2020) 30 tablet 1  . valACYclovir (VALTREX) 500 MG tablet Take 500-1,000 mg by mouth See admin instructions. Take 1000 mg at first onset of fever blisters, then take 500 mg daily for 3 days (Patient not taking: Reported on 08/16/2020)     No current facility-administered medications for this visit.  Allergies  Allergen Reactions  . Azithromycin     Patient gets worst whenever she takes a Z-Pak  . Levaquin [Levofloxacin In D5w]     Makes symptoms worse     Review of Systems: All systems reviewed and negative except where noted in HPI.     Physical Exam:    Complete physical exam not completed due to the nature of this telehealth communication.     ASSESSMENT AND PLAN;   1) Heartburn 2) Belching 3) Aerophagia  - Start Baclofen 5 mg qhs and can uptitrate to TID as tolerated and depending on efficacy.  - Resume omeprazole  - Patient o/w does NOT want to proceed with antireflux surgery (i.e. cTIF) at  this juncture, and based on study results, I agree that medical management is preferred option - Can f/u with me prn, otherwise, to f/u with [facility] as scheduled on October 26  4) Dysphagia/Peptic stricture -Resolved with recent EGD with dilation  5) Colon cancer screening -Previously declined CRC screening.  Can discuss again with her primary Gastroenterologist next month  6) Barrett's Esophagus -EGD in 05/2020 with short segment nondysplastic Barrett's esophagus -Repeat EGD in 2022   [place], DO, FACG  08/16/2020, 1:25 PM   Wolfgang Phoenix, Elayne Snare, MD

## 2020-08-16 NOTE — Patient Instructions (Signed)
-   Start Baclofen 5 mg qhs and can uptitrate to TID as tolerated and depending on efficacy.  - Resume omeprazole  - Patient o./w does NOT want to proceed with antireflux surgery at this juncture, and based on study results, I agree that medical management is preferred option - Can f/u with me prn, otherwise, to f/u with [facility] as scheduled on October 26   Vito V Clive, DO, [facility]  08/16/2020, 1:25 PM

## 2020-08-23 ENCOUNTER — Encounter: Payer: Self-pay | Admitting: Family Medicine

## 2020-08-23 ENCOUNTER — Telehealth: Payer: Self-pay | Admitting: *Deleted

## 2020-08-23 ENCOUNTER — Other Ambulatory Visit: Payer: Self-pay

## 2020-08-23 ENCOUNTER — Telehealth (INDEPENDENT_AMBULATORY_CARE_PROVIDER_SITE_OTHER): Payer: BC Managed Care – PPO | Admitting: Family Medicine

## 2020-08-23 DIAGNOSIS — R5383 Other fatigue: Secondary | ICD-10-CM | POA: Insufficient documentation

## 2020-08-23 DIAGNOSIS — Z13220 Encounter for screening for lipoid disorders: Secondary | ICD-10-CM

## 2020-08-23 NOTE — Telephone Encounter (Signed)
[name], cly are scheduled for a virtual visit with your provider today.    Just as we do with appointments in the office, we must obtain your consent to participate.  Your consent will be active for this visit and any virtual visit you may have with one of our providers in the next 365 days.    If you have a MyChart account, I can also send a copy of this consent to you electronically.  All virtual visits are billed to your insurance company just like a traditional visit in the office.  As this is a virtual visit, video technology does not allow for your provider to perform a traditional examination.  This may limit your provider's ability to fully assess your condition.  If your provider identifies any concerns that need to be evaluated in person or the need to arrange testing such as labs, EKG, etc, we will make arrangements to do so.    Although advances in technology are sophisticated, we cannot ensure that it will always work on either your end or our end.  If the connection with a video visit is poor, we may have to switch to a telephone visit.  With either a video or telephone visit, we are not always able to ensure that we have a secure connection.   I need to obtain your verbal consent now.   Are you willing to proceed with your visit today?   Carla Wise has provided verbal consent on 08/23/2020 for a virtual visit (video or telephone).   [name], LPN [id number]/[id number]  0:60 AM

## 2020-08-23 NOTE — Progress Notes (Signed)
Patient ID: Carla Wise, female    DOB: 09-27-1966, 54 y.o.   MRN: [id number]   Chief Complaint  Patient presents with  . Fatigue    Patient reports extreme fatigue x 1 month. Patient states she may feel good for a few hours in the day and then just feels completely drained.    Subjective:  CC: fatigue  HPI This is a new problem. Wakes up rested and tired after lunch for one month.  No changes in health reported except fatigue.   Medical History Carla Wise has a past medical history of Constipation, Gastric adenoma (02/2017), GERD (gastroesophageal reflux disease), and PONV (postoperative nausea and vomiting).   Outpatient Encounter Medications as of 08/23/2020  Medication Sig  . amitriptyline (ELAVIL) 25 MG tablet 2 PO QHS (Patient not taking: Reported on 08/16/2020)  . baclofen (LIORESAL) 10 MG tablet Take 1/2 at bedtime. Can titrate up to three times as tolerated and depending on efficacy. (Patient not taking: Reported on 08/23/2020)  . busPIRone (BUSPAR) 10 MG tablet Take 1 tablet (10 mg total) by mouth 3 (three) times daily. (Patient not taking: Reported on 08/16/2020)  . meclizine (ANTIVERT) 25 MG tablet Take 1 tablet (25 mg total) by mouth 3 (three) times daily as needed for dizziness or nausea. (Patient not taking: Reported on 08/16/2020)  . omeprazole (PRILOSEC) 20 MG capsule 1 PO 30 MINS PRIOR TO MEALS TWICE DAILY (Patient not taking: Reported on 08/23/2020)  . valACYclovir (VALTREX) 500 MG tablet Take 500-1,000 mg by mouth See admin instructions. Take 1000 mg at first onset of fever blisters, then take 500 mg daily for 3 days (Patient not taking: Reported on 08/16/2020)  . zolpidem (AMBIEN) 5 MG tablet Take one tablet po qhs prn. DO NOT TAKE WITH XANAX (Patient taking differently: Take 5 mg by mouth as needed. Take one tablet po qhs prn. DO NOT TAKE WITH XANAX)   No facility-administered encounter medications on file as of 08/23/2020.  Virtual Visit via Video Note  I connected with  Carla Wise on 08/23/20 at  9:30 AM EDT by a video enabled telemedicine application and verified that I am speaking with the correct person using two identifiers.  Location: Patient: work place Provider: office   I discussed the limitations of evaluation and management by telemedicine and the availability of in person appointments. The patient expressed understanding and agreed to proceed.  History of Present Illness: Carla Wise presents today via video visit.  She is complaining of feeling really tired after lunch for 1 month.  It has been a a while since she has had labs drawn at this office.  Last labs were April 2019.   Observations/Objective:   Assessment and Plan: Carla Wise seems to be sleeping adequately at night.  Feels rested when she wakes but tires by lunchtime.  We will begin this with getting current lab work.  We will also get lipid profile as she will follow-up with her annual exam after these labs are resulted.  Follow Up Instructions:    I discussed the assessment and treatment plan with the patient. The patient was provided an opportunity to ask questions and all were answered. The patient agreed with the plan and demonstrated an understanding of the instructions.   The patient was advised to call back or seek an in-person evaluation if the symptoms worsen or if the condition fails to improve as anticipated.  I provided 15 minutes of non-face-to-face time during this encounter.   Chalmers Guest, NP  Review of Systems  Constitutional: Positive for fatigue. Negative for chills, fever and unexpected weight change.  Respiratory: Negative.   Cardiovascular: Negative.   Endocrine: Negative.   Genitourinary: Negative.   Neurological: Negative.   Hematological: Negative.   Psychiatric/Behavioral: Negative.      Vitals There were no vitals taken for this visit.  This was a video visit  Objective:   Physical Exam Physical exam was unable to be performed today due  to being a video visit.  Assessment and Plan   1. Fatigue, unspecified type - VITAMIN D 25 Hydroxy (Vit-D Deficiency, Fractures) - TSH - Comprehensive Metabolic Panel (CMET) - Lipid panel - CBC with Differential/Platelet  2. Screening for lipid disorders - Lipid panel   We briefly discussed diet.  She reports she does not eat fried food basically eats meat and vegetables.  We will notify her once lab results are available.  She is encouraged to follow-up in 2 to 3 weeks for her annual exam. She reports that she goes to bed around 9:30 and gets up at 6:00 and sleeps fairly well most nights, but not all nights. This issue of fatigue will be much better addressed once lab information is obtained. There are no red flag symptoms observed today.   Agrees with plan of care discussed today. Understands warning signs to seek further care: any changes in health status.  Understands to follow-up in 2-3 weeks for her annual exam, and lab results will be reviewed in depth at that time. If anything is concerning we will notify her immediately.

## 2020-08-24 DIAGNOSIS — Z8639 Personal history of other endocrine, nutritional and metabolic disease: Secondary | ICD-10-CM | POA: Diagnosis not present

## 2020-08-24 DIAGNOSIS — R5383 Other fatigue: Secondary | ICD-10-CM | POA: Diagnosis not present

## 2020-08-24 DIAGNOSIS — Z13220 Encounter for screening for lipoid disorders: Secondary | ICD-10-CM | POA: Diagnosis not present

## 2020-08-25 LAB — CBC WITH DIFFERENTIAL/PLATELET
Basophils Absolute: 0.1 10*3/uL (ref 0.0–0.2)
Basos: 1 %
EOS (ABSOLUTE): 0.2 10*3/uL (ref 0.0–0.4)
Eos: 2 %
Hematocrit: 43.2 % (ref 34.0–46.6)
Hemoglobin: 14.4 g/dL (ref 11.1–15.9)
Immature Grans (Abs): 0 10*3/uL (ref 0.0–0.1)
Immature Granulocytes: 0 %
Lymphocytes Absolute: 1.6 10*3/uL (ref 0.7–3.1)
Lymphs: 24 %
MCH: 29.9 pg (ref 26.6–33.0)
MCHC: 33.3 g/dL (ref 31.5–35.7)
MCV: 90 fL (ref 79–97)
Monocytes Absolute: 0.3 10*3/uL (ref 0.1–0.9)
Monocytes: 5 %
Neutrophils Absolute: 4.4 10*3/uL (ref 1.4–7.0)
Neutrophils: 68 %
Platelets: 262 10*3/uL (ref 150–450)
RBC: 4.82 x10E6/uL (ref 3.77–5.28)
RDW: 13 % (ref 11.7–15.4)
WBC: 6.6 10*3/uL (ref 3.4–10.8)

## 2020-08-25 LAB — COMPREHENSIVE METABOLIC PANEL
ALT: 9 IU/L (ref 0–32)
AST: 18 IU/L (ref 0–40)
Albumin/Globulin Ratio: 2.5 — ABNORMAL HIGH (ref 1.2–2.2)
Albumin: 4.7 g/dL (ref 3.8–4.9)
Alkaline Phosphatase: 90 IU/L (ref 44–121)
BUN/Creatinine Ratio: 12 (ref 9–23)
BUN: 9 mg/dL (ref 6–24)
Bilirubin Total: 0.3 mg/dL (ref 0.0–1.2)
CO2: 25 mmol/L (ref 20–29)
Calcium: 9.7 mg/dL (ref 8.7–10.2)
Chloride: 103 mmol/L (ref 96–106)
Creatinine, Ser: 0.78 mg/dL (ref 0.57–1.00)
GFR calc Af Amer: 100 mL/min/{1.73_m2} (ref >59)
GFR calc non Af Amer: 86 mL/min/{1.73_m2} (ref >59)
Globulin, Total: 1.9 g/dL (ref 1.5–4.5)
Glucose: 100 mg/dL — ABNORMAL HIGH (ref 65–99)
Potassium: 4.3 mmol/L (ref 3.5–5.2)
Sodium: 142 mmol/L (ref 134–144)
Total Protein: 6.6 g/dL (ref 6.0–8.5)

## 2020-08-25 LAB — VITAMIN D 25 HYDROXY (VIT D DEFICIENCY, FRACTURES): Vit D, 25-Hydroxy: 41.4 ng/mL (ref 30.0–100.0)

## 2020-08-25 LAB — LIPID PANEL
Chol/HDL Ratio: 5.2 ratio — ABNORMAL HIGH (ref 0.0–4.4)
Cholesterol, Total: 213 mg/dL — ABNORMAL HIGH (ref 100–199)
HDL: 41 mg/dL (ref >39)
LDL Chol Calc (NIH): 140 mg/dL — ABNORMAL HIGH (ref 0–99)
Triglycerides: 180 mg/dL — ABNORMAL HIGH (ref 0–149)
VLDL Cholesterol Cal: 32 mg/dL (ref 5–40)

## 2020-08-25 LAB — TSH: TSH: 0.8 u[IU]/mL (ref 0.450–4.500)

## 2020-09-12 ENCOUNTER — Other Ambulatory Visit: Payer: Self-pay

## 2020-09-12 ENCOUNTER — Ambulatory Visit (INDEPENDENT_AMBULATORY_CARE_PROVIDER_SITE_OTHER): Payer: BC Managed Care – PPO | Admitting: Family Medicine

## 2020-09-12 ENCOUNTER — Ambulatory Visit (HOSPITAL_COMMUNITY)
Admission: RE | Admit: 2020-09-12 | Discharge: 2020-09-12 | Disposition: A | Discharge status: Home / Self Care | Payer: BC Managed Care – PPO | Source: Ambulatory Visit | Attending: Family Medicine | Admitting: Family Medicine

## 2020-09-12 ENCOUNTER — Encounter: Payer: Self-pay | Admitting: Family Medicine

## 2020-09-12 VITALS — BP 102/66 | HR 98 | Temp 97.3°F | Ht 62.0 in | Wt 107.0 lb

## 2020-09-12 DIAGNOSIS — J439 Emphysema, unspecified: Secondary | ICD-10-CM | POA: Diagnosis not present

## 2020-09-12 DIAGNOSIS — R0781 Pleurodynia: Secondary | ICD-10-CM | POA: Insufficient documentation

## 2020-09-12 NOTE — Progress Notes (Signed)
Patient ID: Carla Wise, female    DOB: 1966/09/07, 54 y.o.   MRN: [id number]   Chief Complaint  Patient presents with  . Fall   Subjective:  CC: slipped on rug, fell onto floor and hit right chest/breast area.  HPIfell one week ago and started having pain around right breast 4 days ago. Denies bruising, swelling, fever. Reports difficulty taking a deep breath due to the pain.    Medical History Tracee has a past medical history of Constipation, Gastric adenoma (02/2017), GERD (gastroesophageal reflux disease), and PONV (postoperative nausea and vomiting).   Outpatient Encounter Medications as of 09/12/2020  Medication Sig  . zolpidem (AMBIEN) 5 MG tablet Take one tablet po qhs prn. DO NOT TAKE WITH XANAX (Patient taking differently: Take 5 mg by mouth as needed. Take one tablet po qhs prn. DO NOT TAKE WITH XANAX)  . [DISCONTINUED] amitriptyline (ELAVIL) 25 MG tablet 2 PO QHS (Patient not taking: Reported on 08/16/2020)  . [DISCONTINUED] baclofen (LIORESAL) 10 MG tablet Take 1/2 at bedtime. Can titrate up to three times as tolerated and depending on efficacy. (Patient not taking: Reported on 08/23/2020)  . [DISCONTINUED] busPIRone (BUSPAR) 10 MG tablet Take 1 tablet (10 mg total) by mouth 3 (three) times daily. (Patient not taking: Reported on 08/16/2020)  . [DISCONTINUED] meclizine (ANTIVERT) 25 MG tablet Take 1 tablet (25 mg total) by mouth 3 (three) times daily as needed for dizziness or nausea. (Patient not taking: Reported on 08/16/2020)  . [DISCONTINUED] omeprazole (PRILOSEC) 20 MG capsule 1 PO 30 MINS PRIOR TO MEALS TWICE DAILY (Patient not taking: Reported on 08/23/2020)  . [DISCONTINUED] valACYclovir (VALTREX) 500 MG tablet Take 500-1,000 mg by mouth See admin instructions. Take 1000 mg at first onset of fever blisters, then take 500 mg daily for 3 days (Patient not taking: Reported on 08/16/2020)   No facility-administered encounter medications on file as of 09/12/2020.     Review  of Systems  Constitutional: Negative for fever.  Respiratory: Negative for cough, chest tightness and shortness of breath.   Cardiovascular: Positive for chest pain.       Chest wall and rib pain.  Gastrointestinal: Negative.      Vitals BP 102/66   Pulse 98   Temp (!) 97.3 F (36.3 C)   Ht 5\' 2"  (1.575 m)   Wt 107 lb (48.5 kg)   SpO2 100%   BMI 19.57 kg/m   Objective:   Physical Exam Vitals and nursing note reviewed.  Constitutional:      Appearance: Normal appearance.  Cardiovascular:     Rate and Rhythm: Regular rhythm.     Heart sounds: Normal heart sounds.  Pulmonary:     Effort: Pulmonary effort is normal.     Breath sounds: Normal breath sounds.     Comments: Difficulty taking deep breaths due to rib pain from falling on 9/27. Skin:    General: Skin is warm and dry.  Neurological:     Mental Status: She is alert.  Psychiatric:        Mood and Affect: Mood normal.        Behavior: Behavior normal.        Thought Content: Thought content normal.        Judgment: Judgment normal.      Assessment and Plan   1. Rib pain on right side   Pain and tenderness noted over right side beside breast and over right chest ribs. Due to fall, will get chest  x-ray to rule out fracture of ribs. No bruising or swelling noted today. Tenderness and pain noted over ribs.   Will continue to treat conservatively with heating pad and anti inflammatories. No fracture noted, changes due to smoking history (notified patient via telephone after hours with x-ray results). Actively cutting back on cigarette smoking. Encouraged to take deep breaths to decrease risk of developing pneumonia.   Agrees with plan of care discussed today. Understands warning signs to seek further care: increased pain and swelling.  Understands to follow-up if symptoms do not improve or if they get worse.   Pecolia Ades, FNP-C 09/12/2020

## 2020-09-12 NOTE — Patient Instructions (Signed)
  It is possible that you fractured your rib. Will send you to [facility] for an x-ray to make sure. I will call you with the results later this evening. Protect your ribs, but please take some deep breaths to prevent getting pneumonia.    Rib Fracture  A rib fracture is a break or crack in one of the bones of the ribs. The ribs are like a cage that goes around your upper chest. A broken or cracked rib is often painful, but most do not cause other problems. Most rib fractures usually heal on their own in 1-3 months. Follow these instructions at home: Managing pain, stiffness, and swelling  If directed, apply ice to the injured area. ? Put ice in a plastic bag. ? Place a towel between your skin and the bag. ? Leave the ice on for 20 minutes, 2-3 times a day.  Take over-the-counter and prescription medicines only as told by your doctor. Activity  Avoid activities that cause pain to the injured area. Protect your injured area.  Slowly increase activity as told by your doctor. General instructions  Do deep breathing as told by your doctor. You may be told to: ? Take deep breaths many times a day. ? Cough many times a day while hugging a pillow. ? Use a device (incentive spirometer) to do deep breathing many times a day.  Drink enough fluid to keep your pee (urine) clear or pale yellow.  Do not wear a rib belt or binder. These do not allow you to breathe deeply.  Keep all follow-up visits as told by your doctor. This is important. Contact a doctor if:  You have a fever. Get help right away if:  You have trouble breathing.  You are short of breath.  You cannot stop coughing.  You cough up thick or bloody spit (sputum).  You feel sick to your stomach (nauseous), throw up (vomit), or have belly (abdominal) pain.  Your pain gets worse and medicine does not help. Summary  A rib fracture is a break or crack in one of the bones of the ribs.  Apply ice to the injured area  and take medicines for pain as told by your doctor.  Take deep breaths and cough many times a day. Hug a pillow every time you cough. This information is not intended to replace advice given to you by your health care provider. Make sure you discuss any questions you have with your health care provider. Document Revised: 11/08/2017 Document Reviewed: 02/26/2017 Elsevier Patient Education  2020 [employer].

## 2020-09-16 ENCOUNTER — Telehealth: Payer: Self-pay | Admitting: *Deleted

## 2020-09-16 ENCOUNTER — Ambulatory Visit (INDEPENDENT_AMBULATORY_CARE_PROVIDER_SITE_OTHER): Payer: BC Managed Care – PPO | Admitting: Nurse Practitioner

## 2020-09-16 ENCOUNTER — Other Ambulatory Visit: Payer: Self-pay

## 2020-09-16 ENCOUNTER — Encounter: Payer: Self-pay | Admitting: Nurse Practitioner

## 2020-09-16 VITALS — BP 118/80 | HR 102 | Temp 97.4°F | Wt 108.0 lb

## 2020-09-16 DIAGNOSIS — Z01419 Encounter for gynecological examination (general) (routine) without abnormal findings: Secondary | ICD-10-CM | POA: Diagnosis not present

## 2020-09-16 DIAGNOSIS — G4700 Insomnia, unspecified: Secondary | ICD-10-CM

## 2020-09-16 DIAGNOSIS — Z79899 Other long term (current) drug therapy: Secondary | ICD-10-CM

## 2020-09-16 DIAGNOSIS — E785 Hyperlipidemia, unspecified: Secondary | ICD-10-CM | POA: Diagnosis not present

## 2020-09-16 DIAGNOSIS — Z1151 Encounter for screening for human papillomavirus (HPV): Secondary | ICD-10-CM | POA: Diagnosis not present

## 2020-09-16 DIAGNOSIS — Z124 Encounter for screening for malignant neoplasm of cervix: Secondary | ICD-10-CM | POA: Diagnosis not present

## 2020-09-16 DIAGNOSIS — Z1231 Encounter for screening mammogram for malignant neoplasm of breast: Secondary | ICD-10-CM

## 2020-09-16 MED ORDER — ROSUVASTATIN CALCIUM 5 MG PO TABS: ORAL_TABLET | ORAL | 2 refills | Status: DC

## 2020-09-16 MED ORDER — ZOLPIDEM TARTRATE 5 MG PO TABS: ORAL_TABLET | ORAL | 2 refills | Status: DC

## 2020-09-16 NOTE — Telephone Encounter (Signed)
PLease refer Carla Wise to [facility] for screening mammo/US  -IM from carolyn.  Patient scheduled for mammo at [facility] Wednesday oct 13th @ 12:30.
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
## 2020-09-18 ENCOUNTER — Encounter: Payer: Self-pay | Admitting: Nurse Practitioner

## 2020-09-18 NOTE — Progress Notes (Signed)
   Subjective:    Patient ID: Carla Wise, female    DOB: 03-21-1966, 54 y.o.   MRN: [id number]  HPI    Review of Systems     Objective:   Physical Exam        Assessment & Plan:

## 2020-09-19 DIAGNOSIS — B351 Tinea unguium: Secondary | ICD-10-CM | POA: Diagnosis not present

## 2020-09-19 DIAGNOSIS — M79674 Pain in right toe(s): Secondary | ICD-10-CM | POA: Diagnosis not present

## 2020-09-22 LAB — IGP, APTIMA HPV: HPV Aptima: POSITIVE — AB

## 2020-09-23 ENCOUNTER — Encounter: Payer: Self-pay | Admitting: Nurse Practitioner

## 2020-09-23 DIAGNOSIS — R8781 Cervical 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virus (HPV) DNA test positive: Secondary | ICD-10-CM | POA: Insufficient documentation

## 2020-09-28 ENCOUNTER — Telehealth: Payer: Self-pay

## 2020-09-28 DIAGNOSIS — Z1239 Encounter for other screening for malignant neoplasm of breast: Secondary | ICD-10-CM

## 2020-09-28 NOTE — Telephone Encounter (Signed)
(  Message For [name])Patient  Has some confession on when she went for her mammogram in [place] she thought it was an ultra  Mammogram she states her orders were wrong. Please Advise

## 2020-09-30 NOTE — Telephone Encounter (Signed)
Contacted [facility]. They did go ahead and schedule ultrasound of both breast. They do a order faxed over. Written out and will fax once provider signs. Ultrasound schedule for 10/07/20 at 3 pm. Pt is aware.

## 2020-09-30 NOTE — Telephone Encounter (Signed)
Pt states that she usually has an ultrasound because she is unable to have a regular mammo due to knot. Pt states she had a mammogram at [facility] once but passed out. Pt would like to go to [place] and Tuesday 3:30 or 4 works better for her. Please advise. Thank you

## 2020-09-30 NOTE — Telephone Encounter (Signed)
That is fine to order [place]. [facility] may recommend a mammogram. They are a breast center so they are used to different size breasts. Let's see what they say when we order [place]. Thanks.

## 2020-09-30 NOTE — Telephone Encounter (Signed)
Please clarify this message. Not sure what it means. Thanks.

## 2020-09-30 NOTE — Telephone Encounter (Signed)
Definitely not a diagnostic mammogram. Patient has very small breasts and has been unable to do regular mammogram but she agrees to do the [place]. If Carla Wise has another solution that would be great.

## 2020-09-30 NOTE — Telephone Encounter (Signed)
Do we need to order diagnostic mammo and ultrasound or just the ultrasound? Please advise. Thank you

## 2020-10-04 ENCOUNTER — Ambulatory Visit: Payer: BC Managed Care – PPO | Admitting: Gastroenterology

## 2020-10-09 NOTE — Progress Notes (Signed)
Referring Provider: Kathyrn Drown, MD Primary Care Physician:  Kathyrn Drown, MD Primary GI Physician: Dr. Abbey Chatters  Chief Complaint  Patient presents with  . Gastroesophageal Reflux    belches, has mint taste; had procedure; hasn't started Baclofen-confused about med    HPI:   Carla Wise is a 54 y.o. female presenting today for follow-up. History of GERD, dyspepsia, dysphagia with peptic stricture, gastritis, gastric polyps (tubular adenoma) in 2018, duodenal diverticulum, constipation. She was on omeprazole, amitriptyline, and baclofen in the past but continued with breakthrough symptoms at her last visit in April 2021. Subsequently, she was referred to [facility] for possible TIF. Previously on Protonix, omeprazole, and Dexilant. GES also ordered and within normal limits on 04/25/20.   She established with Dr. Bryan Lemma in June 2021. 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 No H. Pylori on pathology.  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 Needs repeat EGD in 1 year.  Manometry 07/27/20: Normal.  Ph/impeedance 07/28/20: No evidence of pathologic gastroesophageal reflux.  Reflux symptoms slightly elevated, predominantly weekly.  Increase weekly acid reflux symptoms could be secondary to supragastric belching. Recommended trial of baclofen.  Follow-up telemedicine visit 08/16/20. Patient decided she preferred medication management rather than antireflux surgery. She was started on baclofen 5 mg. Stated she can titrate up to TID as tolerated and depending on efficacy. Resume omeprazole. Follow-up prn.   Today:  Dysphagia improved.   Has a lot of belching. Present after eating. Last 15-20 minutes. Resolves until she eats again. Not drinking through a straw. Not eating  fried/fatty/greast foods. Eats some broccoli, cabbage, turnip greens. Puts vinegar on her vegetables. Mostly chicken or baked prokchop. Little beef occasionally. Constant minty feeling in her esophagus. No typical reflux.  Chocolate worsens minty sensation.   Eats cheez-its and Mountain dew for breakfast. Has been doing this for 20 years. Doesn't eat lunch. Never has. Eating a smaller portion for dinner and notes improvement in belching. Drinks tea for dinner, no soda for dinner.   Doesn't remember anything helping with the minty sensation in her esophagus.   No nausea or vomiting. No abdominal pain.   BMs daily. No constipation or diarrhea. No blood in the stool or black stool.   Doesn't want to have colonoscopy. States she may have it with EGD next year.   Down to 5 cigarettes daily.   Past Medical History:  Diagnosis Date  . Barrett's esophagus   . Constipation   . Gastric adenoma 02/2017  . GERD (gastroesophageal reflux disease)   . PONV (postoperative nausea and vomiting)     Past Surgical History:  Procedure Laterality Date  . [place] STUDY N/A 07/28/2020   Procedure: [place] STUDY;  Surgeon: Lavena Bullion, DO;  [facility]: [facility];  Service: Gastroenterology;  Laterality: N/A;  . BIOPSY  01/30/2019   Procedure: BIOPSY;  Surgeon: Danie Binder, MD;  [facility];  Service: Endoscopy;;  . ESOPHAGEAL MANOMETRY N/A 07/27/2020   Procedure: ESOPHAGEAL MANOMETRY (EM);  Surgeon: Lavena Bullion, DO;  [facility];  Service: Gastroenterology;  Laterality: N/A;  . ESOPHAGOGASTRODUODENOSCOPY N/A 03/01/2017   Dr. Oneida Alar: benign-appearing peptic stricture s/p dilation, gastric polyp (tubular adenoma), gastritis, duodenal diverticulm  . ESOPHAGOGASTRODUODENOSCOPY N/A 01/30/2019   Procedure: ESOPHAGOGASTRODUODENOSCOPY (EGD);  Surgeon: Danie Binder, MD;  Benign-appearing esophageal stenosis, single 4 mm sessile polyp (hyperplastic), mild gastritis,  normal examined duodenum (pathology- Peptic duodenitis)  . EXAM UNDER ANESTHESIA WITH MANIPULATION OF SHOULDER Left 06/06/2017   Procedure: EXAM UNDER ANESTHESIA WITH MANIPULATION OF SHOULDER;  Surgeon: Carole Civil, MD;  Location: [facility];  Service: Orthopedics;  Laterality: Left;  full relaxation  . FOOT SURGERY Left 2003   hammer toe- great toe  . KNEE ARTHROSCOPY W/ MENISCECTOMY     left knee  . SAVORY DILATION N/A 03/01/2017   Procedure: SAVORY DILATION;  Surgeon: Danie Binder, MD;  [facility];  Service: Endoscopy;  Laterality: N/A;  . UPPER GASTROINTESTINAL ENDOSCOPY  05/20/2020   Dr. Bryan Lemma: Benign stricture dilated 20 mm TTS balloon then fractured with forceps, 1 cm sliding-type HH, esophageal ulcer x2, Hill grade 3 valve, mild gastritis, no H. pylori.  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 Needs repeat EGD in 1 year.     Current Outpatient Medications  Medication Sig Dispense Refill  . Baclofen 5 MG TABS Take 5 mg by mouth.    . rosuvastatin (CRESTOR) 5 MG tablet Take 1 tab PO Monday, Wednesday, Friday for cholesterol 12 tablet 2  . zolpidem (AMBIEN) 5 MG tablet Take one tablet po qhs prn sleep 30 tablet 2  . esomeprazole (NEXIUM) 40 MG capsule Take 1 capsule (40 mg total) by mouth 2 (two) times daily before a meal. 60 capsule 5   No current facility-administered medications for this visit.    Allergies as of 10/10/2020 - Review Complete 10/10/2020  Allergen Reaction Noted  . Azithromycin  11/23/2013  . Levaquin [levofloxacin in d5w]  02/04/2015    Family History  Problem Relation Age of Onset  . Heart disease Other   . Cancer Other   . Colon cancer Neg Hx   . Esophageal cancer Neg Hx   . Rectal cancer Neg Hx   . Stomach cancer Neg Hx     Social History   Socioeconomic History  . Marital status: Married    Spouse name: Not on file  . Number of children: Not on file  . Years of education: 12th grade  . Highest education  level: Not on file  Occupational History  . 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 Smoking status: Current Every Day Smoker    Packs/day: 0.50    Years: 25.00    Pack years: 12.50    Types: Cigarettes  . Smokeless tobacco: Never Used  . Tobacco comment: Smokes 5 cigs daily  Vaping Use  . Vaping Use: Never used  Substance and Sexual Activity  . Alcohol use: No  . Drug use: No  . Sexual activity: Yes    Birth control/protection: Surgical  Other Topics Concern  . Not on file  Social History Narrative  . Not on file   Social Determinants of Health   Financial Resource Strain:   . Difficulty of Paying Living Expenses: Not on file  Food Insecurity:   . Worried About Charity fundraiser in the Last Year: Not on file  . Ran Out of Food in the Last Year: Not on file  Transportation Needs:   . Lack of Transportation (Medical): Not on file  . Lack of Transportation (Non-Medical): Not on file  Physical Activity:   . Days of Exercise per Week: Not on file  . Minutes of Exercise per Session: Not on file  Stress:   . Feeling  of Stress : Not on file  Social Connections:   . Frequency of Communication with Friends and Family: Not on file  . Frequency of Social Gatherings with Friends and Family: Not on file  . Attends Religious Services: Not on file  . Active Member of Clubs or Organizations: Not on file  . Attends Archivist Meetings: Not on file  . Marital Status: Not on file    Review of Systems: Gen: Denies fever, chills, cold or flulike symptoms, lightheadedness, dizziness, presyncope, syncope. CV: Denies chest pain or palpitations Resp: Denies dyspnea or cough GI: See HPI Heme: See HPI  Physical Exam: BP 103/65   Pulse 85   Temp (!) 97.3 F (36.3 C) (Temporal)   Ht 5\' 2"  (1.575 m)   Wt 106 lb 9.6 oz (48.4 kg)   BMI 19.50 kg/m  General:   Alert and oriented. No distress noted. Pleasant and cooperative.  Head:  Normocephalic and  atraumatic. Eyes:  Conjuctiva clear without scleral icterus. Heart:  S1, S2 present without murmurs appreciated. Lungs:  Clear to auscultation bilaterally. No wheezes, rales, or rhonchi. No distress.  Abdomen:  +BS, soft, non-tender and non-distended. No rebound or guarding. No HSM or masses noted. Msk:  Symmetrical without gross deformities. Normal posture. Extremities:  Without edema. Neurologic:  Alert and  oriented x4 Psych:  Normal mood and affect.

## 2020-10-10 ENCOUNTER — Encounter: Payer: Self-pay | Admitting: Gastroenterology

## 2020-10-10 ENCOUNTER — Ambulatory Visit: Payer: BC Managed Care – PPO | Admitting: Gastroenterology

## 2020-10-10 ENCOUNTER — Other Ambulatory Visit: Payer: Self-pay

## 2020-10-10 VITALS — BP 103/65 | HR 85 | Temp 97.3°F | Ht 62.0 in | Wt 106.6 lb

## 2020-10-10 DIAGNOSIS — K2270 Barrett's esophagus without dysplasia: Secondary | ICD-10-CM

## 2020-10-10 DIAGNOSIS — R142 Eructation: Secondary | ICD-10-CM | POA: Diagnosis not present

## 2020-10-10 DIAGNOSIS — K2100 Gastro-esophageal reflux disease with esophagitis, without bleeding: Secondary | ICD-10-CM | POA: Diagnosis not present

## 2020-10-10 DIAGNOSIS — R143 Flatulence: Secondary | ICD-10-CM | POA: Insufficient documentation

## 2020-10-10 MED ORDER — ESOMEPRAZOLE MAGNESIUM 40 MG PO CPDR: 40.0000 mg | DELAYED_RELEASE_CAPSULE | Freq: Two times a day (BID) | ORAL | 5 refills | Status: DC

## 2020-10-10 NOTE — Assessment & Plan Note (Addendum)
Continues with postprandial belching. Also notes ongoing increased flatus. Belching improve with decreasing meal size. After discussion today, she reports eating very fast as she is always in a hurry. Also consuming several common gas producing items (broccoli, cabbage, turnip greens, and soda).  pH/impedance testing August 2021 ordered by Dr. Bryan Lemma with slight increase in reflux, predominantly weekly which may be secondary to supra gastric belching with recommendations to try baclofen.  Baclofen 5 mg daily with ability to titrate up to 3 times daily was prescribed, but patient has not started this medication.  Dietary and lifestyle habits could be contributing to belching.  She does have history of GERD, and although she denies typical GERD symptoms, she was recently found to have ulcerative reflux esophagitis and barrett's esophagus in June 2021; therefore, symptoms may also be secondary to atypical GERD. She is not currently on a PPI, but has previously been on omeprazole 20 mg BID, Protonix 40 mg daily, and Dexilant 60 mg daily. Considering she also has increased flatus, she could have SIBO.  Plan: 1.  Start baclofen as previously prescribed by Dr. Bryan Lemma.  She will take 5 mg daily 30 minutes before dinner and may increase to 3 times daily as tolerated. 2.  Avoid common gas producing triggers including broccoli, cabbage, cauliflower, Brussels sprouts, beans, artificial sweeteners, carbonated beverages, drinking through a straw, chewing gum. 3.  Slow down while eating.  4.  Trial high-dose PPI therapy.  Start Nexium 40 mg twice daily 30 minutes before breakfast and dinner. 5.  Counseled on GERD diet/lifestyle.  Information was provided on this. 6.  May also try daily probiotic to help with increased flatulence.  Advised that align, digestive advantage, or [facility] colon [facility] were good options. 7.  Follow-up in 8 weeks.  Call with questions or concerns prior.  Could consider treating for SIBO  in the future if no clinical improvement.

## 2020-10-10 NOTE — Assessment & Plan Note (Addendum)
Recently underwent EGD with Dr. Bryan Lemma in June 2021 revealing esophageal stricture s/p dilation, esophageal ulcers x2, and esophageal biopsy pathology consistent with Barrett's esophagus without dysplasia.  Recommended repeat EGD in 1 year.

## 2020-10-10 NOTE — Patient Instructions (Addendum)
Please start Nexium 40 mg twice daily 30 minutes before breakfast and dinner.   Follow a GERD diet:  Avoid fried, fatty, greasy, spicy, citrus foods. Avoid caffeine and carbonated beverages. Avoid chocolate. Do not eat within 3 hours of laying down. Prop head of bed up on wood or bricks to create a 6 inch incline.  You may start Baclofen  As Dr. Bryan Lemma prescribed. Start with once daily 30 minutes before dinner. You may increase to 3 times daily as tolerated.   Please be sure you are eating slowly and chewing well as this will influence your belching.   Avoid common items that cause increased gas production including broccoli, cabbage, cauliflower, Brussels sprouts, beans, artificial sweeteners, carbonated beverages, drinking through a straw, chewing gum.  You may also try starting a daily probiotic.  If you good options include align, [facility] Neg[facility] colon health, or digestive advantage.  We will follow up with you in about 8 weeks. Do not hesitate to call with questions or concerns prior.   Aliene Altes, PA-C [facility] Gastroenterology

## 2020-10-10 NOTE — Assessment & Plan Note (Signed)
Addressed under belching.

## 2020-10-10 NOTE — Assessment & Plan Note (Addendum)
Chronic history of GERD.  Patient denies typical reflux symptoms but notes a constant "minty" sensation in her esophagus and postprandial belching.  Chocolate worsen symptoms.  She has history of recurrent peptic stricture.  Recent EGD 05/20/2020 with Dr. Bryan Lemma with benign stricture s/p dilation, esophageal ulcers x2 with pathology consistent with Barrett's esophagus without dysplasia with recommendations to repeat EGD in 1 year.  Also with gastritis with no H. Pylori.  Manometry and pH impedance testing in August 2021.  Manometry was normal.  pH impedance testing with slight increased reflux, predominantly weekly which may be secondary to supra gastric belching with recommendations to trial baclofen.  She initially saw Dr. Bryan Lemma for consideration of transoral incision less fundoplication but patient opted for medical management.  Per patient, Dr. Bryan Lemma told her that surgery would not correct her symptoms. She is not currently on any PPI and has not started baclofen as she was confused about this prescription.  Previously on omeprazole 20 mg twice daily, Protonix 40 mg daily, and Dexilant 60 mg daily.    With findings of ulcerative reflux esophagitis and Barrett's esophagus on EGD, I will try her on high-dose PPI therapy to see if this provides any improvement in her symptoms.  Plan: 1. Start Nexium 40 mg twice daily 30 minutes before breakfast and dinner. 2. Counseled on GERD diet/lifestyle.  Information was provided on this. 3. May start baclofen as Dr. Bryan Lemma prescribed.  Advise she take this 30 minutes before dinner as most of her belching occurs after dinner. May increase to 3 times daily as tolerated and pending clinical improvement.  4. Counseled on importance of eating slowly. 5. Advised she avoid common gas producing triggers including broccoli, cabbage, cauliflower, Brussels sprouts, beans, artificial sweeteners, carbonated beverages, drinking through a straw, chewing gum. 6.  Follow-up in 8 weeks.

## 2020-10-11 NOTE — Progress Notes (Signed)
Cc'ed to pcp °

## 2020-11-01 DIAGNOSIS — N6489 Other specified disorders of breast: Secondary | ICD-10-CM | POA: Diagnosis not present

## 2020-11-01 DIAGNOSIS — N6002 Solitary cyst of left breast: Secondary | ICD-10-CM | POA: Diagnosis not present

## 2020-11-02 DIAGNOSIS — H16223 Keratoconjunctivitis sicca, not specified as Sjogren's, bilateral: Secondary | ICD-10-CM | POA: Diagnosis not present

## 2020-11-12 DIAGNOSIS — Z1231 Encounter for screening mammogram for malignant neoplasm of breast: Secondary | ICD-10-CM | POA: Diagnosis not present

## 2020-12-12 ENCOUNTER — Ambulatory Visit: Payer: BC Managed Care – PPO | Admitting: Gastroenterology

## 2020-12-14 DIAGNOSIS — E785 Hyperlipidemia, unspecified: Secondary | ICD-10-CM | POA: Diagnosis not present

## 2020-12-14 DIAGNOSIS — Z79899 Other long term (current) drug therapy: Secondary | ICD-10-CM | POA: Diagnosis not present

## 2020-12-15 ENCOUNTER — Encounter: Payer: Self-pay | Admitting: Orthopedic Surgery

## 2020-12-15 ENCOUNTER — Ambulatory Visit: Payer: BC Managed Care – PPO | Admitting: Orthopedic Surgery

## 2020-12-15 ENCOUNTER — Ambulatory Visit: Payer: BC Managed Care – PPO

## 2020-12-15 ENCOUNTER — Other Ambulatory Visit: Payer: Self-pay

## 2020-12-15 VITALS — BP 130/75 | HR 92 | Ht 62.0 in | Wt 107.0 lb

## 2020-12-15 DIAGNOSIS — M7062 Trochanteric bursitis, left hip: Secondary | ICD-10-CM | POA: Diagnosis not present

## 2020-12-15 DIAGNOSIS — M25552 Pain in left hip: Secondary | ICD-10-CM

## 2020-12-15 DIAGNOSIS — M25551 Pain in right hip: Secondary | ICD-10-CM

## 2020-12-15 DIAGNOSIS — M7061 Trochanteric bursitis, right hip: Secondary | ICD-10-CM | POA: Diagnosis not present

## 2020-12-15 LAB — LIPID PANEL
Chol/HDL Ratio: 4 ratio (ref 0.0–4.4)
Cholesterol, Total: 176 mg/dL (ref 100–199)
HDL: 44 mg/dL (ref >39)
LDL Chol Calc (NIH): 108 mg/dL — ABNORMAL HIGH (ref 0–99)
Triglycerides: 135 mg/dL (ref 0–149)
VLDL Cholesterol Cal: 24 mg/dL (ref 5–40)

## 2020-12-15 LAB — HEPATIC FUNCTION PANEL
ALT: 9 IU/L (ref 0–32)
AST: 12 IU/L (ref 0–40)
Albumin: 4.8 g/dL (ref 3.8–4.9)
Alkaline Phosphatase: 88 IU/L (ref 44–121)
Bilirubin Total: 0.3 mg/dL (ref 0.0–1.2)
Bilirubin, Direct: <0.1 mg/dL (ref 0.00–0.40)
Total Protein: 6.6 g/dL (ref 6.0–8.5)

## 2020-12-15 NOTE — Patient Instructions (Addendum)
Use ice packs On both hips  ITs okay to take over-the-counter anti-inflammatory medicine such as ibuprofen 2 to 3 tablets 2-3 times a day  Hip Bursitis  Hip bursitis is inflammation of a fluid-filled sac (bursa) in the hip joint. The bursa prevents the bones in the hip joint from rubbing against each other. Hip bursitis can cause mild to moderate pain, and symptoms often come and go over time. What are the causes? This condition may be caused by:  Injury to the hip.  Overuse of the muscles that surround the hip joint.  Previous injury or surgery of the hip.  Arthritis or gout.  Diabetes.  Thyroid disease.  Infection. In some cases, the cause may not be known. What are the signs or symptoms? Symptoms of this condition include:  Mild or moderate pain in the hip area. Pain may get worse with movement.  Tenderness and swelling of the hip, especially on the outer side of the hip.  In rare cases, the bursa may become infected. This may cause a fever, as well as warmth and redness in the area. Symptoms may come and go. How is this diagnosed? This condition may be diagnosed based on:  A physical exam.  Your medical history.  X-rays.  Removal of fluid from your inflamed bursa for testing (biopsy). You may be sent to a health care provider who specializes in bone diseases (orthopedist) or a provider who specializes in joint inflammation (rheumatologist). How is this treated? This condition is treated by resting, icing, applying pressure (compression), and raising (elevating) the injured area. This is called RICE treatment. In some cases, this may be enough to make your symptoms go away. Treatment may also include:  Using crutches.  Draining fluid out of the bursa to help relieve swelling.  Injecting medicine that helps to reduce inflammation (cortisone).  Additional medicines if the bursa is infected. Follow these instructions at home: Managing pain, stiffness, and  swelling   If directed, put ice on the painful area. ? Put ice in a plastic bag. ? Place a towel between your skin and the bag. ? Leave the ice on for 20 minutes, 2-3 times a day. ? Raise (elevate) your hip above the level of your heart as much as you can without pain. To do this, try putting a pillow under your hips while you lie down. Activity  Return to your normal activities as told by your health care provider. Ask your health care provider what activities are safe for you.  Rest and protect your hip as much as possible until your pain and swelling get better. General instructions  Take over-the-counter and prescription medicines only as told by your health care provider.  Wear compression wraps only as told by your health care provider.  Do not use your hip to support your body weight until your health care provider says that you can. Use crutches as told by your health care provider.  Gently massage and stretch your injured area as often as is comfortable.  Keep all follow-up visits as told by your health care provider. This is important. How is this prevented?  Exercise regularly, as told by your health care provider.  Warm up and stretch before being active.  Cool down and stretch after being active.  If an activity irritates your hip or causes pain, avoid the activity as much as possible.  Avoid sitting down for long periods at a time. Contact a health care provider if you:  Have a fever.  Develop new symptoms.  Have difficulty walking or doing everyday activities.  Have pain that gets worse or does not get better with medicine.  Develop red skin or a feeling of warmth in your hip area. Get help right away if you:  Cannot move your hip.  Have severe pain. Summary  Hip bursitis is inflammation of a fluid-filled sac (bursa) in the hip joint.  Hip bursitis can cause mild to moderate pain, and symptoms often come and go over time.  This condition is  treated with rest, ice, compression, elevation, and medicines. This information is not intended to replace advice given to you by your health care provider. Make sure you discuss any questions you have with your health care provider. Document Revised: 08/04/2018 Document Reviewed: 08/04/2018 Elsevier Patient Education  [place].

## 2020-12-15 NOTE — Progress Notes (Signed)
Carla Wise  12/15/2020  Body mass index is 19.57 kg/m.  ASSESSMENT AND PLAN:     Trochanteric bursitis both hips  Recommend ice  Anti-inflammatories  Bilateral core cortisone injections   Encounter Diagnoses  Name Primary?  . Pain in left hip   . Pain in right hip   . Trochanteric bursitis, left hip Yes  . Trochanteric bursitis, right hip     Imaging [facility] lateral both hips including a pelvis normal hip joints  HISTORY SECTION :  Chief Complaint  Patient presents with  . Hip Pain    Lt > Rt getting worse over past 2 mos. States she also has knots on both hips.    HPI The patient presents for evaluation of severe mild mod severe /  right or left /right and left hip pain for several weeks associated with difficulty rolling onto the right and left side denies back pain or groin pain  Review of Systems  Neurological: Negative for tingling.  All other systems reviewed and are negative.    has a past medical history of Barrett's esophagus, Constipation, Gastric adenoma (02/2017), GERD (gastroesophageal reflux disease), and PONV (postoperative nausea and vomiting).   Past Surgical History:  Procedure Laterality Date  . 24 HOUR PH STUDY N/A 07/28/2020   Procedure: 24 HOUR PH STUDY;  Surgeon: Shellia Cleverly, DO;  [facility];  Service: Gastroenterology;  Laterality: N/A;  . BIOPSY  01/30/2019   Procedure: BIOPSY;  Surgeon: West Bali, MD;  [facility];  Service: Endoscopy;;  . ESOPHAGEAL MANOMETRY N/A 07/27/2020   Procedure: ESOPHAGEAL MANOMETRY (EM);  Surgeon: Shellia Cleverly, DO;  [facility];  Service: Gastroenterology;  Laterality: N/A;  . ESOPHAGOGASTRODUODENOSCOPY N/A 03/01/2017   Dr. Darrick Penna: benign-appearing peptic stricture s/p dilation, gastric polyp (tubular adenoma), gastritis, duodenal diverticulm  . ESOPHAGOGASTRODUODENOSCOPY N/A 01/30/2019   Procedure: ESOPHAGOGASTRODUODENOSCOPY (EGD);  Surgeon: West Bali,  MD;  Benign-appearing esophageal stenosis, single 4 mm sessile polyp (hyperplastic), mild gastritis, normal examined duodenum (pathology- Peptic duodenitis)  . EXAM UNDER ANESTHESIA WITH MANIPULATION OF SHOULDER Left 06/06/2017   Procedure: EXAM UNDER ANESTHESIA WITH MANIPULATION OF SHOULDER;  Surgeon: Vickki Hearing, MD;  Location: [facility];  Service: Orthopedics;  Laterality: Left;  full relaxation  . FOOT SURGERY Left 2003   hammer toe- great toe  . KNEE ARTHROSCOPY W/ MENISCECTOMY     left knee  . SAVORY DILATION N/A 03/01/2017   Procedure: SAVORY DILATION;  Surgeon: West Bali, MD;  [facility];  Service: Endoscopy;  Laterality: N/A;  . UPPER GASTROINTESTINAL ENDOSCOPY  05/20/2020   Dr. Barron Alvine: Benign stricture dilated 20 mm TTS balloon then fractured with forceps, 1 cm sliding-type HH, esophageal ulcer x2, Hill grade 3 valve, mild gastritis, no H. pylori.  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 Needs repeat EGD in 1 year.     Social History   Tobacco Use  . Smoking status: Current Every Day Smoker    Packs/day: 0.50    Years: 25.00    Pack years: 12.50    Types: Cigarettes  . Smokeless tobacco: Never Used  . Tobacco comment: Smokes 5 cigs daily  Vaping Use  . Vaping Use: Never used  Substance Use Topics  . Alcohol use: No  . Drug use: No    Family History  Problem Relation Age of Onset  . Heart disease Other   . Cancer Other   . Colon cancer Neg Hx   .  Esophageal cancer Neg Hx   . Rectal cancer Neg Hx   . Stomach cancer Neg Hx       Allergies  Allergen Reactions  . Azithromycin     Patient gets worst whenever she takes a Z-Pak  . Levaquin [Levofloxacin In D5w]     Makes symptoms worse     Current Outpatient Medications:  .  esomeprazole (NEXIUM) 40 MG capsule, Take 1 capsule (40 mg total) by mouth 2 (two) times daily before a meal., Disp: 60 capsule, Rfl: 5 .  zolpidem (AMBIEN) 5 MG tablet, Take one tablet po qhs prn  sleep, Disp: 30 tablet, Rfl: 2 .  Baclofen 5 MG TABS, Take 5 mg by mouth. (Patient not taking: Reported on 12/15/2020), Disp: , Rfl:  .  rosuvastatin (CRESTOR) 5 MG tablet, Take 1 tab PO Monday, Wednesday, Friday for cholesterol (Patient not taking: Reported on 12/15/2020), Disp: 12 tablet, Rfl: 2   PHYSICAL EXAM SECTION: BP 130/75   Pulse 92   Ht 5\' 2"  (1.575 m)   Wt 107 lb (48.5 kg)   BMI 19.57 kg/m   Body mass index is 19.57 kg/m.   General appearance: Well-developed well-nourished no gross deformities  Eyes clear normal vision no evidence of conjunctivitis or jaundice, extraocular muscles intact  Lymph nodes: No lymphadenopathy  Neck is supple without palpable mass, full range of motion  Cardiovascular normal pulse and perfusion in all 4 extremities normal color without edema  Neurologically  no sensation loss or deficits no pathologic reflexes  Psychological: Awake alert and oriented x3 mood and affect normal  Skin no lacerations or ulcerations no nodularity no palpable masses, no erythema or nodularity  Musculoskeletal:   Tenderness over the right and left hip nontender over the lumbar spine normal range of motion both hips both leg lengths are equal, swelling over both trochanteric bursa  Procedure note injection for right hip bursitis  Verbal consent was obtained for injection of the right hip  Timeout was completed to confirm the injection site  The medications used were 40 mg of Depo-Medrol and 1% lidocaine 3 cc  Anesthesia was provided by ethyl chloride and the skin was prepped with alcohol.  After cleaning the skin with alcohol a 25-gauge needle was used to inject the greater trochanteric bursa right hip   No complications were noted  Procedure note injection for left hip bursitis  Verbal consent was obtained for injection of the  left hip   Timeout was completed to confirm the injection site  The medications used were 40 mg of Depo-Medrol and 1%  lidocaine 3 cc  Anesthesia was provided by ethyl chloride and the skin was prepped with alcohol.  After cleaning the skin with alcohol a 25-gauge needle was used to inject the left hip greater trochanteric bursa  4:08 PM

## 2020-12-19 DIAGNOSIS — B351 Tinea unguium: Secondary | ICD-10-CM | POA: Diagnosis not present

## 2020-12-19 DIAGNOSIS — M79674 Pain in right toe(s): Secondary | ICD-10-CM | POA: Diagnosis not present

## 2020-12-29 ENCOUNTER — Other Ambulatory Visit: Payer: Self-pay | Admitting: Orthopedic Surgery

## 2020-12-29 ENCOUNTER — Telehealth: Payer: Self-pay | Admitting: Orthopedic Surgery

## 2020-12-29 MED ORDER — IBUPROFEN 800 MG PO TABS: 800.0000 mg | ORAL_TABLET | Freq: Three times a day (TID) | ORAL | 1 refills | Status: DC | PRN

## 2020-12-29 NOTE — Telephone Encounter (Signed)
Patient states she is not doing well with the Ibuprofen that she is taking at home.  She said this happened before.    She asks that he send in Ibuprofen 800 mgs. For her to [facility]

## 2021-01-04 ENCOUNTER — Other Ambulatory Visit: Payer: Self-pay

## 2021-01-08 NOTE — Progress Notes (Signed)
Referring Provider: Kathyrn Drown, MD Primary Care Physician:  Kathyrn Drown, MD Primary GI Physician: Dr. Abbey Chatters  Chief Complaint  Patient presents with  . Gastroesophageal Reflux    Take nexium BID. Still has some issues    HPI:   Carla Wise is a 55 y.o. female presenting today for follow-up. History of GERD with belching and minty sensation in esophagus, barrett's esophagus and ulcerative esophagitis due for repeat EGD in June 2022, dyspepsia, dysphagia with peptic stricture, gastritis, gastric polyps (tubular adenoma) in 2018, duodenal diverticulum, constipation. On multiple PPIs for GERD including Protonix 40 mg daily, omeprazole 20 mg twice daily, Dexilant 60 mg daily, and now nexium 40 mg twice daily. GES also ordered and within normal limits on 04/25/20. Previously referred to [facility] for possible TIF.   She established with Dr. Bryan Lemma in June 2021. 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 No H. 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 Needs repeat EGD in 1 year.  Manometry 07/27/20: Normal.  Ph/impeedance 07/28/20: No evidence of pathologic gastroesophageal reflux.  Reflux symptoms slightly elevated, predominantly weekly.  Increase weekly acid reflux symptoms could be secondary to supragastric belching. Recommended trial of baclofen.  Follow-up telemedicine visit 08/16/20. Patient preferred medication management rather than antireflux surgery. She was started on baclofen 5 mg. Stated she can titrate up to TID as tolerated and depending on efficacy. Resume omeprazole. Follow-up prn.   Patient was last seen in our office 10/10/20. Dysphagia had improved. Reported having a lot of belching postprandially lasting 15-20 minutes. Worse after  dinner. Some increased flatus as well.  Also constant minty sensation in esophagus, worsened by chocolate. No typical reflux symptoms. Reported eating very fast. Drinking [facility] every morning. Eats broccoli, cabbage, turnip greens. Puts vinegar on her vegetables. Eating small portions for dinner which improves belching. Cannot member anything about helping minty sensation in her esophagus. No nausea, vomiting, or abdominal pain. No other lower GI symptoms. Smoking 5 cigarettes daily. Never tried baclofen as recommended by Dr. Bryan Lemma. Also not on a PPI.  Plan to start baclofen 5 mg daily and may increase up to 3 times daily as tolerated, start Nexium 40 mg twice daily, avoid common gas producing items, eat slowly, try daily probiotic, follow-up in 8 weeks.  Could consider treating for SIBO if no clinical improvement.  Today: She is taking Nexium BID. Not taking Baclofen. Can't remember if she started Baclofen after her last visit. States she got confused about which medication to take.   Symptoms of belching and minty sensation come and go. Symptoms about 5/7 days a week. Not as severe. Worse with chili beans. States she is trying to limit gas producing items. Later states she has started eating what she wants because she wants to live and it seems she continues with symptoms regardless of what she eats. Also reports passing a lot of gas as well. She is trying to limit beans. Eating green beans, turnip greens, boiled chicken. No fried foods. Drinking 8 oz mountain dew daily. Doesn't eat dinner within 3 hours of going to bed. Trying to slow down with eating.   Has bursitis in her hips. She was started on ibuprofen but only taking 1-2 times a week.   No dysphagia. No abdominal pain. No constipation or diarrhea. No BRBPR or melena.  No prior colonoscopy. Interested in having colonoscopy with EGD in June.    Past Medical History:  Diagnosis Date  . Barrett's esophagus   . Constipation   .  Gastric adenoma 02/2017  . GERD (gastroesophageal reflux disease)   . PONV (postoperative nausea and vomiting)     Past Surgical History:  Procedure Laterality Date  . [place] STUDY N/A 07/28/2020   Procedure: [place] STUDY;  Surgeon: Lavena Bullion, DO;  Location: [facility];  Service: Gastroenterology;  Laterality: N/A;  . BIOPSY  01/30/2019   Procedure: BIOPSY;  Surgeon: Danie Binder, MD;  Location: [facility];  Service: Endoscopy;;  . ESOPHAGEAL MANOMETRY N/A 07/27/2020   Procedure: ESOPHAGEAL MANOMETRY (EM);  Surgeon: Lavena Bullion, DO;  Location: [facility];  Service: Gastroenterology;  Laterality: N/A;  . ESOPHAGOGASTRODUODENOSCOPY N/A 03/01/2017   Dr. Oneida Alar: benign-appearing peptic stricture s/p dilation, gastric polyp (tubular adenoma), gastritis, duodenal diverticulm  . ESOPHAGOGASTRODUODENOSCOPY N/A 01/30/2019   Procedure: ESOPHAGOGASTRODUODENOSCOPY (EGD);  Surgeon: Danie Binder, MD;  Benign-appearing esophageal stenosis, single 4 mm sessile polyp (hyperplastic), mild gastritis, normal examined duodenum (pathology- Peptic duodenitis)  . EXAM UNDER ANESTHESIA WITH MANIPULATION OF SHOULDER Left 06/06/2017   Procedure: EXAM UNDER ANESTHESIA WITH MANIPULATION OF SHOULDER;  Surgeon: Carole Civil, MD;  Location: [facility];  Service: Orthopedics;  Laterality: Left;  full relaxation  . FOOT SURGERY Left 2003   hammer toe- great toe  . KNEE ARTHROSCOPY W/ MENISCECTOMY     left knee  . SAVORY DILATION N/A 03/01/2017   Procedure: SAVORY DILATION;  Surgeon: Danie Binder, MD;  Location: [facility];  Service: Endoscopy;  Laterality: N/A;  . UPPER GASTROINTESTINAL ENDOSCOPY  05/20/2020   Dr. Bryan Lemma: Benign stricture dilated 20 mm TTS balloon then fractured with forceps, 1 cm sliding-type HH, esophageal ulcer x2, Hill grade 3 valve, mild gastritis, no H. pylori.  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 Needs repeat EGD in 1 year.      Current Outpatient Medications  Medication Sig Dispense Refill  . esomeprazole (NEXIUM) 40 MG capsule Take 1 capsule (40 mg total) by mouth 2 (two) times daily before a meal. 60 capsule 5  . ibuprofen (ADVIL) 800 MG tablet Take 1 tablet (800 mg total) by mouth every 8 (eight) hours as needed. 90 tablet 1  . rosuvastatin (CRESTOR) 5 MG tablet Take 1 tab PO Monday, Wednesday, Friday for cholesterol 12 tablet 2  . zolpidem (AMBIEN) 5 MG tablet Take one tablet po qhs prn sleep 30 tablet 2  . Baclofen 5 MG TABS Take 5 mg by mouth. (Patient not taking: Reported on 01/09/2021)     No current facility-administered medications for this visit.    Allergies as of 01/09/2021 - Review Complete 01/09/2021  Allergen Reaction Noted  . Azithromycin  11/23/2013  . Levaquin [levofloxacin in d5w]  02/04/2015    Family History  Problem Relation Age of Onset  . Heart disease Other   . Cancer Other   . Colon cancer Neg Hx   . Esophageal cancer Neg Hx   . Rectal cancer Neg Hx   . Stomach cancer Neg Hx     Social History   Socioeconomic History  . Marital status: Married    Spouse name: Not on file  . Number of children: Not on file  . Years of education: 12th grade  . Highest education level: Not on file  Occupational History  . 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 Smoking status: Current Every Day Smoker    Packs/day: 0.50    Years: 25.00    Pack years: 12.50    Types: Cigarettes  . Smokeless tobacco: Never Used  . Tobacco comment: Smokes 5 cigs daily  Vaping Use  . Vaping Use: Never used  Substance and Sexual Activity  . Alcohol use: No  . Drug use: No  . Sexual activity: Yes    Birth control/protection: Surgical  Other Topics Concern  . Not on file  Social History Narrative  . Not on file   Social Determinants of Health   Financial Resource Strain: Not on file  Food Insecurity: Not on file  Transportation Needs: Not on file  Physical Activity:  Not on file  Stress: Not on file  Social Connections: Not on file    Review of Systems: Gen: Denies fever, chills, cold or flulike symptoms, lightheadedness, dizziness, presyncope, syncope. CV: Denies chest pain or palpitations. Resp: Denies dyspnea or cough. GI: See HPI. Heme: See HPI  Physical Exam: BP 109/68   Pulse 70   Temp (!) 97.5 F (36.4 C)   Ht 5\' 2"  (1.575 m)   Wt 108 lb (49 kg)   BMI 19.75 kg/m  General:   Alert and oriented. No distress noted. Pleasant and cooperative.  Head:  Normocephalic and atraumatic. Eyes:  Conjuctiva clear without scleral icterus. Heart:  S1, S2 present without murmurs appreciated. Lungs:  Clear to auscultation bilaterally. No wheezes, rales, or rhonchi. No distress.  Abdomen: +BS, soft, non-tender and non-distended. No rebound or guarding. No HSM or masses noted. Msk:  Symmetrical without gross deformities. Normal posture. Extremities:  Without edema. Neurologic:  Alert and  oriented x4 Psych: Normal mood and affect.

## 2021-01-09 ENCOUNTER — Ambulatory Visit (INDEPENDENT_AMBULATORY_CARE_PROVIDER_SITE_OTHER): Payer: BC Managed Care – PPO | Admitting: Gastroenterology

## 2021-01-09 ENCOUNTER — Encounter: Payer: Self-pay | Admitting: Gastroenterology

## 2021-01-09 ENCOUNTER — Other Ambulatory Visit: Payer: Self-pay

## 2021-01-09 VITALS — BP 109/68 | HR 70 | Temp 97.5°F | Ht 62.0 in | Wt 108.0 lb

## 2021-01-09 DIAGNOSIS — Z1211 Encounter for screening for malignant neoplasm of colon: Secondary | ICD-10-CM | POA: Diagnosis not present

## 2021-01-09 DIAGNOSIS — K2100 Gastro-esophageal reflux disease with esophagitis, without bleeding: Secondary | ICD-10-CM

## 2021-01-09 DIAGNOSIS — R143 Flatulence: Secondary | ICD-10-CM | POA: Diagnosis not present

## 2021-01-09 DIAGNOSIS — R142 Eructation: Secondary | ICD-10-CM | POA: Diagnosis not present

## 2021-01-09 NOTE — Patient Instructions (Addendum)
Start Baclofen 5 mg daily and increase to 3 times daily as tolerated.  Continue Nexium 40 mg twice daily 30 minutes before breakfast and dinner.  Try adding a daily probiotic.  Good options include align, digestive advantage, [employer] colon health.  Continue to avoid gas producing items including broccoli, cauliflower, cabbage, Brussels sprouts, carbonated beverages, drinking through a straw, hard candies, artificial sweeteners.  We will plan to see you back in 4-6 weeks.  Do not hesitate to call with questions or concerns prior.  Aliene Altes, PA-C [facility]

## 2021-01-10 ENCOUNTER — Encounter: Payer: Self-pay | Admitting: Internal Medicine

## 2021-01-12 ENCOUNTER — Encounter: Payer: Self-pay | Admitting: Gastroenterology

## 2021-01-12 NOTE — Assessment & Plan Note (Addendum)
Chronic history of GERD with associated minty sensation in esophagus and belching. Previously on Protonix 40 mg daily, omeprazole 20 mg twice daily, Dexilant 60 mg daily, and now nexium 40 mg twice daily. Typical GERD symptoms seem fairly well controlled and she has noticed some improvement in minty sensation and belching, but continues with frequent symptoms. No dysphagia. Still consuming some common gas producing items. She was to start Baclofen after her last visit in November, but never started this medication. Previously referred to Dr. Bryan Lemma for consideration of TIF.  Ultimately, she declined surgical management.  With Dr. 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 No H. Pylori on pathology but evidence of short segment nondysplastic Barrett's Esophagus. Recommended repeat EGD in 1 year. PH/impedance in August with no evidence of pathologic reflux.  Reflux symptoms slightly elevated, predominantly weekly.  Stated increased weekly acid reflux symptoms could be secondary to supra gastric belching.    As typical GERD symptoms seem fairly well controlled, will focus on management of belching.  I suspect if we can get this under control, patient will feel much better. Notably, she also reports having a lot of flatulence as well. May need to consider treatment for SIBO, but will trial baclofen first.   Plan:  1.  Continue Nexium 40 mg BID.  2.  Start Baclofen 5 mg daily and increase to 3 times daily as tolerated.  3.  Avoid gas producing items including broccoli, cauliflower, cabbage, Brussels sprouts, carbonated beverages, drinking through a straw, hard candies, artificial sweeteners. 4.  Due for repeat EGD in June 2022.  5.  Follow-up in 4-6 weeks.

## 2021-01-12 NOTE — Assessment & Plan Note (Addendum)
Patient reports having a lot of gas/flatulence. Also with increased belching as discussed under GERD. May be dietary related as she continues to consume some common gas producing items (tunip greens, soda). SIBO also in the differential.   Plan:  1. Counseled on avoiding gas producing items.  2.  Advised to add daily probiotic- Align, Digestive Advantage, [employer].  3.  Starting Baclofen for belching as discussed under GERD.  4.  Follow-up in 4-6 weeks. If no significant improvement, consider treating for SIBO.

## 2021-01-12 NOTE — Assessment & Plan Note (Signed)
No prior screening colonoscopy.  No alarm symptoms.  Desires to have colonoscopy at time of repeat EGD in June 2022.  We will discuss scheduling her next office visit in 4-6 weeks.

## 2021-01-12 NOTE — Assessment & Plan Note (Signed)
Addressed under GERD 

## 2021-01-23 ENCOUNTER — Encounter: Payer: Self-pay | Admitting: Family Medicine

## 2021-01-23 ENCOUNTER — Ambulatory Visit: Payer: BC Managed Care – PPO | Admitting: Family Medicine

## 2021-01-23 ENCOUNTER — Other Ambulatory Visit: Payer: Self-pay

## 2021-01-23 VITALS — BP 105/71 | HR 98 | Temp 97.6°F | Ht 62.0 in | Wt 107.8 lb

## 2021-01-23 DIAGNOSIS — K644 Residual hemorrhoidal skin tags: Secondary | ICD-10-CM | POA: Diagnosis not present

## 2021-01-23 MED ORDER — HYDROCORTISONE ACETATE 25 MG RE SUPP: 25.0000 mg | Freq: Two times a day (BID) | RECTAL | 0 refills | Status: DC

## 2021-01-23 NOTE — Progress Notes (Signed)
Pt here for hemorrhoids. Pt has had them for a week and has been using Preparation H but no relief.     Patient ID: Carla Wise, female    DOB: October 05, 1966, 55 y.o.   MRN: [id number]   Chief Complaint  Patient presents with  . Hemorrhoids   Subjective:  CC: hemorrhoid   This is a new problem.  Presents today for an acute visit with a complaint of an external hemorrhoid.  Symptoms have been present for 1 week, denies bleeding.  Has tried Preparation H, which has helped some but has not resolved the problem.  Has also tried soaking in warm water at night without much relief.  Denies fever, chills, chest pain, shortness of breath.    Medical History Nevayah has a past medical history of Barrett's esophagus, Constipation, Gastric adenoma (02/2017), GERD (gastroesophageal reflux disease), and PONV (postoperative nausea and vomiting).   Outpatient Encounter Medications as of 01/23/2021  Medication Sig  . Baclofen 5 MG TABS Take 5 mg by mouth.  . esomeprazole (NEXIUM) 40 MG capsule Take 1 capsule (40 mg total) by mouth 2 (two) times daily before a meal.  . hydrocortisone (ANUSOL-HC) 25 MG suppository Place 1 suppository (25 mg total) rectally 2 (two) times daily.  Marland Kitchen ibuprofen (ADVIL) 800 MG tablet Take 1 tablet (800 mg total) by mouth every 8 (eight) hours as needed.  . rosuvastatin (CRESTOR) 5 MG tablet Take 1 tab PO Monday, Wednesday, Friday for cholesterol  . zolpidem (AMBIEN) 5 MG tablet Take one tablet po qhs prn sleep   No facility-administered encounter medications on file as of 01/23/2021.     Review of Systems  Constitutional: Negative for chills and fever.  Respiratory: Negative for cough and shortness of breath.   Cardiovascular: Negative for chest pain.  Gastrointestinal: Negative for abdominal pain, blood in stool, constipation, diarrhea and rectal pain.       Large external hemorrhoid since last week. Last Tuesday started Prep H.   Genitourinary: Negative for dysuria.   Neurological: Negative for headaches.     Vitals BP 105/71   Pulse 98   Temp 97.6 F (36.4 C)   Ht 5\' 2"  (1.575 m)   Wt 107 lb 12.8 oz (48.9 kg)   SpO2 96%   BMI 19.72 kg/m   Objective:   Physical Exam Cardiovascular:     Rate and Rhythm: Regular rhythm.     Heart sounds: Normal heart sounds.  Pulmonary:     Effort: Pulmonary effort is normal.     Breath sounds: Normal breath sounds.  Genitourinary:    Rectum: External hemorrhoid present. No anal fissure.  Neurological:     Mental Status: She is alert.      Assessment and Plan   1. External hemorrhoid - hydrocortisone (ANUSOL-HC) 25 MG suppository; Place 1 suppository (25 mg total) rectally 2 (two) times daily.  Dispense: 14 suppository; Refill: 0   External hemorrhoid visualized, no anal fissure, no bleeding.  Will order hydrocortisone suppository to use twice per day for 7 days.  She will continue warm water soaks for comfort.  She will stop using Preparation H.  Agrees with plan of care discussed today. Understands warning signs to seek further care: chest pain, shortness of breath, any significant change in health.  Understands to follow-up if symptoms do not improve, or worsen.  Will consider general surgery referral if needed.  Declines this today.   Chalmers Guest, NP 01/23/2021

## 2021-01-23 NOTE — Patient Instructions (Signed)
Hemorrhoids Hemorrhoids are swollen veins that may develop:  In the butt (rectum). These are called internal hemorrhoids.  Around the opening of the butt (anus). These are called external hemorrhoids. Hemorrhoids can cause pain, itching, or bleeding. Most of the time, they do not cause serious problems. They usually get better with diet changes, lifestyle changes, and other home treatments. What are the causes? This condition may be caused by:  Having trouble pooping (constipation).  Pushing hard (straining) to poop.  Watery poop (diarrhea).  Pregnancy.  Being very overweight (obese).  Sitting for long periods of time.  Heavy lifting or other activity that causes you to strain.  Anal sex.  Riding a bike for a long period of time. What are the signs or symptoms? Symptoms of this condition include:  Pain.  Itching or soreness in the butt.  Bleeding from the butt.  Leaking poop.  Swelling in the area.  One or more lumps around the opening of your butt. How is this diagnosed? A doctor can often diagnose this condition by looking at the affected area. The doctor may also:  Do an exam that involves feeling the area with a gloved hand (digital rectal exam).  Examine the area inside your butt using a small tube (anoscope).  Order blood tests. This may be done if you have lost a lot of blood.  Have you get a test that involves looking inside the colon using a flexible tube with a camera on the end (sigmoidoscopy or colonoscopy). How is this treated? This condition can usually be treated at home. Your doctor may tell you to change what you eat, make lifestyle changes, or try home treatments. If these do not help, procedures can be done to remove the hemorrhoids or make them smaller. These may involve:  Placing rubber bands at the base of the hemorrhoids to cut off their blood supply.  Injecting medicine into the hemorrhoids to shrink them.  Shining a type of light  energy onto the hemorrhoids to cause them to fall off.  Doing surgery to remove the hemorrhoids or cut off their blood supply. Follow these instructions at home: Eating and drinking  Eat foods that have a lot of fiber in them. These include whole grains, beans, nuts, fruits, and vegetables.  Ask your doctor about taking products that have added fiber (fibersupplements).  Reduce the amount of fat in your diet. You can do this by: ? Eating low-fat dairy products. ? Eating less red meat. ? Avoiding processed foods.  Drink enough fluid to keep your pee (urine) pale yellow.   Managing pain and swelling  Take a warm-water bath (sitz bath) for 20 minutes to ease pain. Do this 3-4 times a day. You may do this in a bathtub or using a portable sitz bath that fits over the toilet.  If told, put ice on the painful area. It may be helpful to use ice between your warm baths. ? Put ice in a plastic bag. ? Place a towel between your skin and the bag. ? Leave the ice on for 20 minutes, 2-3 times a day.   General instructions  Take over-the-counter and prescription medicines only as told by your doctor. ? Medicated creams and medicines may be used as told.  Exercise often. Ask your doctor how much and what kind of exercise is best for you.  Go to the bathroom when you have the urge to poop. Do not wait.  Avoid pushing too hard when you poop.  Keep your butt dry and clean. Use wet toilet paper or moist towelettes after pooping.  Do not sit on the toilet for a long time.  Keep all follow-up visits as told by your doctor. This is important. Contact a doctor if you:  Have pain and swelling that do not get better with treatment or medicine.  Have trouble pooping.  Cannot poop.  Have pain or swelling outside the area of the hemorrhoids. Get help right away if you have:  Bleeding that will not stop. Summary  Hemorrhoids are swollen veins in the butt or around the opening of the  butt.  They can cause pain, itching, or bleeding.  Eat foods that have a lot of fiber in them. These include whole grains, beans, nuts, fruits, and vegetables.  Take a warm-water bath (sitz bath) for 20 minutes to ease pain. Do this 3-4 times a day. This information is not intended to replace advice given to you by your health care provider. Make sure you discuss any questions you have with your health care provider. Document Revised: 12/04/2018 Document Reviewed: 04/17/2018 Elsevier Patient Education  [place].

## 2021-01-30 ENCOUNTER — Other Ambulatory Visit: Payer: Self-pay | Admitting: Nurse Practitioner

## 2021-01-30 NOTE — Telephone Encounter (Signed)
3 refills needs follow-up visit by spring

## 2021-02-04 ENCOUNTER — Emergency Department (HOSPITAL_COMMUNITY)
Admission: EM | Admit: 2021-02-04 | Discharge: 2021-02-04 | Disposition: A | Discharge status: Home / Self Care | Payer: BC Managed Care – PPO | Attending: Emergency Medicine | Admitting: Emergency Medicine

## 2021-02-04 DIAGNOSIS — F17210 Nicotine dependence, cigarettes, uncomplicated: Secondary | ICD-10-CM | POA: Insufficient documentation

## 2021-02-04 DIAGNOSIS — K6289 Other specified diseases of anus and rectum: Secondary | ICD-10-CM | POA: Diagnosis not present

## 2021-02-04 DIAGNOSIS — Z79899 Other long term (current) drug therapy: Secondary | ICD-10-CM | POA: Diagnosis not present

## 2021-02-04 DIAGNOSIS — K5900 Constipation, unspecified: Secondary | ICD-10-CM

## 2021-02-04 DIAGNOSIS — K644 Residual hemorrhoidal skin tags: Secondary | ICD-10-CM | POA: Insufficient documentation

## 2021-02-04 LAB — CBC WITH DIFFERENTIAL/PLATELET
Abs Immature Granulocytes: 0.06 10*3/uL (ref 0.00–0.07)
Basophils Absolute: 0.1 10*3/uL (ref 0.0–0.1)
Basophils Relative: 1 %
Eosinophils Absolute: 0.1 10*3/uL (ref 0.0–0.5)
Eosinophils Relative: 1 %
HCT: 43.3 % (ref 36.0–46.0)
Hemoglobin: 14.1 g/dL (ref 12.0–15.0)
Immature Granulocytes: 1 %
Lymphocytes Relative: 8 %
Lymphs Abs: 1 10*3/uL (ref 0.7–4.0)
MCH: 30.1 pg (ref 26.0–34.0)
MCHC: 32.6 g/dL (ref 30.0–36.0)
MCV: 92.5 fL (ref 80.0–100.0)
Monocytes Absolute: 0.5 10*3/uL (ref 0.1–1.0)
Monocytes Relative: 4 %
Neutro Abs: 10.4 10*3/uL — ABNORMAL HIGH (ref 1.7–7.7)
Neutrophils Relative %: 85 %
Platelets: 281 10*3/uL (ref 150–400)
RBC: 4.68 MIL/uL (ref 3.87–5.11)
RDW: 13.3 % (ref 11.5–15.5)
WBC: 12.1 10*3/uL — ABNORMAL HIGH (ref 4.0–10.5)
nRBC: 0 % (ref 0.0–0.2)

## 2021-02-04 LAB — BASIC METABOLIC PANEL
Anion gap: 9 (ref 5–15)
BUN: 14 mg/dL (ref 6–20)
CO2: 25 mmol/L (ref 22–32)
Calcium: 8.9 mg/dL (ref 8.9–10.3)
Chloride: 102 mmol/L (ref 98–111)
Creatinine, Ser: 0.72 mg/dL (ref 0.44–1.00)
GFR, Estimated: >60 mL/min (ref >60)
Glucose, Bld: 152 mg/dL — ABNORMAL HIGH (ref 70–99)
Potassium: 3.8 mmol/L (ref 3.5–5.1)
Sodium: 136 mmol/L (ref 135–145)

## 2021-02-04 LAB — POC OCCULT BLOOD, ED: Fecal Occult Bld: NEGATIVE

## 2021-02-04 MED ORDER — SODIUM CHLORIDE 0.9 % IV BOLUS
500.0000 mL | Freq: Once | INTRAVENOUS | Status: AC
  Administered 2021-02-04: 500 mL via INTRAVENOUS

## 2021-02-04 MED ORDER — LORAZEPAM 2 MG/ML IJ SOLN
0.5000 mg | Freq: Once | INTRAMUSCULAR | Status: DC
  Filled 2021-02-04: qty 1

## 2021-02-04 MED ORDER — GOLYTELY 236 G PO SOLR: ORAL | 0 refills | Status: DC

## 2021-02-04 NOTE — ED Triage Notes (Signed)
States she is being treated for hemorrhoids, states she tried to have a bowel movement and feels like she has a block in her rectum

## 2021-02-04 NOTE — Discharge Instructions (Signed)
Follow-up with your family doctor next week if not improving.  Get your prescription today and start taking it

## 2021-02-04 NOTE — ED Provider Notes (Signed)
[facility] EMERGENCY DEPARTMENT Provider Note   CSN: [id number] Arrival date & time: 02/04/21  1139     History Chief Complaint  Patient presents with  . Rectal Pain    Carla Wise is a 55 y.o. female.  Patient complains of rectal pain.  And constipation  The history is provided by the patient and medical records. No language interpreter was used.  Constipation Severity:  Moderate Timing:  Constant Progression:  Worsening Chronicity:  New Context: not dehydration   Stool description:  None produced Relieved by:  Nothing Worsened by:  Nothing Ineffective treatments:  None tried Associated symptoms: no abdominal pain, no back pain and no diarrhea        Past Medical History:  Diagnosis Date  . Barrett's esophagus   . Constipation   . Gastric adenoma 02/2017  . GERD (gastroesophageal reflux disease)   . PONV (postoperative nausea and vomiting)     Patient Active Problem List   Diagnosis Date Noted  . External hemorrhoid 01/23/2021  . Barrett's esophagus 10/10/2020  . Flatulence 10/10/2020  . Papanicolaou smear of cervix with positive 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 virus (HPV) test 09/23/2020  . Insomnia 09/16/2020  . Hyperlipidemia 09/16/2020  . Rib pain on right side 09/12/2020  . Fatigue 08/23/2020  . Belching   . History of gastric polyp 01/28/2019  . Gastroesophageal reflux disease 12/30/2018  . Anxiety 06/18/2018  . Adhesive capsulitis of left shoulder   . Dyspepsia and disorder of function of stomach   . Dysphagia 02/15/2017  . Colon cancer screening 02/15/2017  . Arthritis of knee, degenerative 03/09/2014  . Knee pain 08/21/2011  . Stiffness of joint, not elsewhere classified, lower leg 08/21/2011  . Medial meniscus, posterior horn derangement 08/02/2011  . Torn meniscus 07/19/2011  . HIP PAIN 02/16/2009    Past Surgical History:  Procedure Laterality Date  . [place] STUDY N/A 07/28/2020   Procedure: [place] STUDY;  Surgeon:  Lavena Bullion, DO;  Location: [facility] ENDOSCOPY;  Service: Gastroenterology;  Laterality: N/A;  . BIOPSY  01/30/2019   Procedure: BIOPSY;  Surgeon: Danie Binder, MD;  Location: [facility] ENDO SUITE;  Service: Endoscopy;;  . ESOPHAGEAL MANOMETRY N/A 07/27/2020   Procedure: ESOPHAGEAL MANOMETRY (EM);  Surgeon: Lavena Bullion, DO;  Location: [facility]OSCOPY;  Service: Gastroenterology;  Laterality: N/A;  . ESOPHAGOGASTRODUODENOSCOPY N/A 03/01/2017   Dr. Oneida Alar: benign-appearing peptic stricture s/p dilation, gastric polyp (tubular adenoma), gastritis, duodenal diverticulm  . ESOPHAGOGASTRODUODENOSCOPY N/A 01/30/2019   Procedure: ESOPHAGOGASTRODUODENOSCOPY (EGD);  Surgeon: Danie Binder, MD;  Benign-appearing esophageal stenosis, single 4 mm sessile polyp (hyperplastic), mild gastritis, normal examined duodenum (pathology- Peptic duodenitis)  . EXAM UNDER ANESTHESIA WITH MANIPULATION OF SHOULDER Left 06/06/2017   Procedure: EXAM UNDER ANESTHESIA WITH MANIPULATION OF SHOULDER;  Surgeon: Carole Civil, MD;  Location: [facility] ORS;  Service: Orthopedics;  Laterality: Left;  full relaxation  . FOOT SURGERY Left 2003   hammer toe- great toe  . KNEE ARTHROSCOPY W/ MENISCECTOMY     left knee  . SAVORY DILATION N/A 03/01/2017   Procedure: SAVORY DILATION;  Surgeon: Danie Binder, MD;  Location: [facility];  Service: Endoscopy;  Laterality: N/A;  . UPPER GASTROINTESTINAL ENDOSCOPY  05/20/2020   Dr. Bryan Lemma: Benign stricture dilated 20 mm TTS balloon then fractured with forceps, 1 cm sliding-type HH, esophageal ulcer x2, Hill grade 3 valve, mild gastritis, no H. pylori.  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 Needs repeat EGD in  1 year.      OB History   No obstetric history on file.     Family History  Problem Relation Age of Onset  . Heart disease Other   . Cancer Other   . Colon cancer Neg Hx   . Esophageal cancer Neg Hx   . Rectal cancer Neg Hx   . Stomach cancer Neg Hx      Social History   Tobacco Use  . Smoking status: Current Every Day Smoker    Packs/day: 0.50    Years: 25.00    Pack years: 12.50    Types: Cigarettes  . Smokeless tobacco: Never Used  . Tobacco comment: Smokes 5 cigs daily  Vaping Use  . Vaping Use: Never used  Substance Use Topics  . Alcohol use: No  . Drug use: No    Home Medications Prior to Admission medications   Medication Sig Start Date End Date Taking? Authorizing Provider  polyethylene glycol (GOLYTELY) 236 g solution Drink one 8 ounce glass every 8 hours until you have a bowel movement. 02/04/21  Yes Milton Ferguson, MD  Baclofen 5 MG TABS Take 5 mg by mouth.    [provider]  esomeprazole (NEXIUM) 40 MG capsule Take 1 capsule (40 mg total) by mouth 2 (two) times daily before a meal. 10/10/20   Jodi Mourning, Tivis Ringer, PA-C  hydrocortisone (ANUSOL-HC) 25 MG suppository Place 1 suppository (25 mg total) rectally 2 (two) times daily. 01/23/21   Chalmers Guest, NP  ibuprofen (ADVIL) 800 MG tablet Take 1 tablet (800 mg total) by mouth every 8 (eight) hours as needed. 12/29/20   Carole Civil, MD  rosuvastatin (CRESTOR) 5 MG tablet TAKE 1 TABLET BY MOUTH ON MONDAY, [facility] AND FRIDAY FOR CHOLESTEROL. 01/30/21   Kathyrn Drown, MD  zolpidem (AMBIEN) 5 MG tablet Take one tablet po qhs prn sleep 09/16/20   Nilda Simmer, NP    Allergies    Azithromycin and Levaquin [levofloxacin in d5w]  Review of Systems   Review of Systems  Constitutional: Negative for appetite change and fatigue.  HENT: Negative for congestion, ear discharge and sinus pressure.   Eyes: Negative for discharge.  Respiratory: Negative for cough.   Cardiovascular: Negative for chest pain.  Gastrointestinal: Positive for constipation. Negative for abdominal pain and diarrhea.       Rectal pain  Genitourinary: Negative for frequency and hematuria.  Musculoskeletal: Negative for back pain.  Skin: Negative for rash.  Neurological: Negative  for seizures and headaches.  Psychiatric/Behavioral: Negative for hallucinations.    Physical Exam Updated Vital Signs BP (!) 145/81   Pulse 98   Temp 97.7 F (36.5 C) (Oral)   Resp 17   Ht 5\' 3"  (1.6 m)   Wt 49.4 kg   SpO2 100%   BMI 19.31 kg/m   Physical Exam Vitals reviewed.  Constitutional:      Appearance: She is well-developed.  HENT:     Head: Normocephalic.     Nose: Nose normal.  Eyes:     General: No scleral icterus.    Extraocular Movements: EOM normal.     Conjunctiva/sclera: Conjunctivae normal.  Neck:     Thyroid: No thyromegaly.  Cardiovascular:     Rate and Rhythm: Normal rate and regular rhythm.     Heart sounds: No murmur heard. No friction rub. No gallop.   Pulmonary:     Breath sounds: No stridor. No wheezing or rales.  Chest:  Chest wall: No tenderness.  Abdominal:     General: There is no distension.     Tenderness: There is no abdominal tenderness. There is no rebound.  Genitourinary:    Comments: Small external hemorrhoid.  On digital exam patient had stool in vault and I attempted to evacuate it but was not successful Musculoskeletal:        General: No edema. Normal range of motion.     Cervical back: Neck supple.  Lymphadenopathy:     Cervical: No cervical adenopathy.  Skin:    Findings: No erythema or rash.  Neurological:     Mental Status: She is alert and oriented to person, place, and time.     Motor: No abnormal muscle tone.     Coordination: Coordination normal.  Psychiatric:        Mood and Affect: Mood and affect normal.        Behavior: Behavior normal.     ED Results / Procedures / Treatments   Labs (all labs ordered are listed, but only abnormal results are displayed) Labs Reviewed  CBC WITH DIFFERENTIAL/PLATELET - Abnormal; Notable for the following components:      Result Value   WBC 12.1 (*)    Neutro Abs 10.4 (*)    All other components within normal limits  BASIC METABOLIC PANEL - Abnormal; Notable for  the following components:   Glucose, Bld 152 (*)    All other components within normal limits  POC OCCULT BLOOD, ED    EKG None  Radiology No results found.  Procedures Procedures   Medications Ordered in ED Medications  LORazepam (ATIVAN) injection 0.5 mg (0.5 mg Intravenous Not Given 02/04/21 1313)  sodium chloride 0.9 % bolus 500 mL (0 mLs Intravenous Stopped 02/04/21 1349)    ED Course  I have reviewed the triage vital signs and the nursing notes.  Pertinent labs & imaging results that were available during my care of the patient were reviewed by me and considered in my medical decision making (see chart for details).    MDM Rules/Calculators/A&P                          Patient with constipation.  She had 2 enemas in the emergency department but continued to have significant stool in her vault.  She will be discharged home with GoLYTELY and follow-up with her doctor Final Clinical Impression(s) / ED Diagnoses Final diagnoses:  None    Rx / DC Orders ED Discharge Orders         Ordered    polyethylene glycol (GOLYTELY) 236 g solution       Note to Pharmacy: May substitute any generic that similar   02/04/21 1505           Milton Ferguson, MD 02/04/21 1508

## 2021-02-05 ENCOUNTER — Telehealth: Payer: Self-pay | Admitting: Family Medicine

## 2021-02-05 NOTE — Telephone Encounter (Signed)
Nurses  Patient was seen in the ER with rectal pain and constipation.  Please connect with patient-if she needs follow-up with [place] this week please schedule Otherwise remind the patient that she has an appointment with gastroenterology in March.  Encourage her to discuss having gastroenterology set her up for colonoscopy she is due

## 2021-02-06 ENCOUNTER — Telehealth: Payer: Self-pay

## 2021-02-06 ENCOUNTER — Other Ambulatory Visit: Payer: Self-pay | Admitting: Gastroenterology

## 2021-02-06 DIAGNOSIS — K649 Unspecified hemorrhoids: Secondary | ICD-10-CM

## 2021-02-06 MED ORDER — LIDOCAINE 5 % EX OINT: 1.0000 "application " | TOPICAL_OINTMENT | Freq: Two times a day (BID) | CUTANEOUS | 1 refills | Status: DC | PRN

## 2021-02-06 MED ORDER — HYDROCORTISONE (PERIANAL) 2.5 % EX CREA: 1.0000 "application " | TOPICAL_CREAM | Freq: Four times a day (QID) | CUTANEOUS | 1 refills | Status: DC

## 2021-02-06 NOTE — Telephone Encounter (Signed)
Spoke with patient.  She has had 2 good bowel movements since taking GoLYTELY.  States she has not one that usually has a bowel movement every day.  Typically 2-3 BMs per week.  She did not feel particularly constipated prior to the hemorrhoid flare.   In addition to recommendations for hemorrhoids on prior telephone note, I have advised that she start MiraLAX 17 g daily as well as Benefiber 2 teaspoons daily x1 week then increase to 3 times daily as tolerated.  Requested progress report on constipation in 1 week.  If MiraLAX and Benefiber are not helping, we can try low-dose Linzess.  I am sending prescriptions for Anusol rectal cream and Lidocaine ointment to her pharmacy.

## 2021-02-06 NOTE — Telephone Encounter (Signed)
Per review of records, appears she is dealing with an external hemorrhoid as well as constipation.  Looks like emergency room discharged her with GoLYTELY for constipation.    We do not band external hemorrhoids.  However, I am not sure if she also has internal hemorrhoids.  Would not set her up for banding until we have evaluated her in the office.  Looks like she has an office visit with me on March 16.  We need to gain better control of her constipation as this is likely the cause of her hemorrhoid flare.  Did she take Golytely and did this help? What is she taking for constipation routinely?   To help with hemorrhoids:  1.  Recommend Anusol rectal cream 2-4 times daily x10-14 days.  I can send a prescription to her pharmacy. 2.  I can also call in lidocaine ointment that she can apply as needed for rectal discomfort. 3.  Sitz bath's twice daily. 4.  Limit toilet time to 2-3 minutes. 5.  No straining.

## 2021-02-06 NOTE — Telephone Encounter (Signed)
Phoned and spoke to the pt was advised she had to drink 3 cups of the GoLYTELY and after 48 hrs throw it away (per pharmacist). So she has not drank anymore. She would like the Anusol rectal cream and Lidocaine cream called in to the pharmacy as well as anything else that will help her.. agreed to follow all the instructions given to her.

## 2021-02-06 NOTE — Telephone Encounter (Signed)
Pt states GI did send her in some meds and she does not need anything from our office at this time.

## 2021-02-06 NOTE — Telephone Encounter (Signed)
Carla Wise This pt called she was seen in the ER over the weekend for really bad hemorrhoids and she had suppositories but she is out. They gave her 2 enemas which didn't help. So she was advised to get in here to be seen. Is this pt a candidate for Vicente Males or would she need to see you first.

## 2021-02-06 NOTE — Telephone Encounter (Signed)
Called and discussed with pt. Pt states she called gi this morning and they sent a message to the nurse to see if she could get worked intoday. Pt to let us know.

## 2021-02-06 NOTE — Telephone Encounter (Signed)
Carla Wise had called called Carla Wise this morning and  Carla Wise  did call her in some meds so she is good.

## 2021-02-13 ENCOUNTER — Telehealth: Payer: Self-pay | Admitting: Internal Medicine

## 2021-02-13 NOTE — Telephone Encounter (Signed)
Pt called to let us know that the miralax and fiber wasn't helping, nothing has changed. She wants to know what to do. Her hemorrhoid is enlarged. She is aware of her OV next week. [phone]

## 2021-02-21 NOTE — H&P (View-Only) (Signed)
Referring Provider: Kathyrn Drown, MD Primary Care Physician:  Kathyrn Drown, MD Primary GI Physician: Dr. Abbey Chatters  Chief Complaint  Patient presents with  . Gastroesophageal Reflux    Still having some belching and gas but not as much    HPI:   Carla Wise is a 55 y.o. female presenting today for follow-up. History of GERD with belching and minty sensation in esophagus,barrett's esophagus and ulcerative esophagitis due for repeat EGD in June 2022, dyspepsia,dysphagia with peptic stricture, gastritis, gastric polyps (tubular adenoma) in 2018, duodenal diverticulum, and constipation.  Has been on multiple PPIs for GERD including Protonix 40 mg daily, omeprazole 20 mg twice daily, Dexilant 60 mg daily, and now nexium 40 mg twice daily. GES also orderedandwithin normal limits on 04/25/20.Previously referred to [facility] for possible TIF.   She established with Dr. Bryan Lemma in June 2021. 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 Needs repeat EGD in 1 year.  Manometry 07/27/20: Normal.  Ph/impeedance 07/28/20:No evidence of pathologic gastroesophageal reflux.Reflux symptoms slightly elevated, predominantly weekly. Increase weekly acid reflux symptoms could be secondary to supragastric belching. Recommended trial of baclofen.  Follow-up telemedicine visit 08/16/20.Patient preferred medication management rather than antireflux surgery. She was started on baclofen 5 mg. Stated she can titrate up to TID as tolerated and depending on efficacy. Resume omeprazole. Follow-up prn.   Last seen in our office 01/09/2021.  She was taking Nexium twice daily but had not started baclofen as she was confused about her  medications.  Symptoms of belching and minty sensation come and go, present 5/7 days a week, not as severe with trying to limit gas producing items and eating slower.  Later stated she had started eating what she wants because she wants to live and it seemed she continued to have symptoms regardless of what she ate.  Also with increased flatulence.  Try to limit beans.  Eating green beans, total grains, boiled chicken.  Drinking 8 ounces of [facility] daily.  She never had a colonoscopy.  She was interested in having this completed in June when she also would need EGD.  Plan to continue Nexium twice daily, start baclofen 5 mg daily and increase to 3 times daily as tolerated, add daily probiotic, continue to avoid gas producing items, follow-up in 4-6 weeks.  Consider treatment for SIBO if no significant improvement.  Today: Baclofen didn't make a difference in belching. Still with intermittent symptoms several days a week though not as bad with Nexium and limiting portion sizes. States it doesn't seem to matter what she eats, it is more related to the portion size. The less she eats, the less belching she gets. Beano wasn't helpful. Probiotic not helpful. Had a little burning in her throat the other day when she ate onion, but other wise, no GERD symptoms typically.  Over the last couple weeks, she has experienced dysphagia 6-7 times with foods not going down her esophagus all the way.  No regurgitation.  They will pass with liquids.  Also increased flatulence/gas.  Worsens with meals but also occurs without eating.  Constipation: Her baseline is BM every 4-5 days.  MiraLAX and Benefiber unhelpful.  Recently started mag citrate.  With this, she has watery stools.  She had significant throbbing rectal pain in the latter  part of February along with worsening constipation and was seen in the emergency room for this.  Diagnosed with hemorrhoids.  She called our office and was prescribed Anusol rectal cream.   States she uses cream for about 4 days and symptoms improved.  She had a little rectal discomfort yesterday morning, but this resolved with Anusol.  Denies rectal bleeding or melena.  No abdominal pain.   Consider GES.   Past Medical History:  Diagnosis Date  . Barrett's esophagus   . Constipation   . Gastric adenoma 02/2017  . GERD (gastroesophageal reflux disease)   . PONV (postoperative nausea and vomiting)     Past Surgical History:  Procedure Laterality Date  . [place] STUDY N/A 07/28/2020   Procedure: [place] STUDY;  Surgeon: Lavena Bullion, DO;  No evidence of pathologic gastroesophageal reflux.Reflux symptoms slightly elevated, predominantly weekly. Increase weekly acid reflux symptoms could be secondary to supragastric belching. Recommended trial of baclofen, not helpful  . BIOPSY  01/30/2019   Procedure: BIOPSY;  Surgeon: Danie Binder, MD;  Location: [facility];  Service: Endoscopy;;  . ESOPHAGEAL MANOMETRY N/A 07/27/2020   Procedure: ESOPHAGEAL MANOMETRY (EM);  Surgeon: Lavena Bullion, DO;  Normal  . ESOPHAGOGASTRODUODENOSCOPY N/A 03/01/2017   Dr. Oneida Alar: benign-appearing peptic stricture s/p dilation, gastric polyp (tubular adenoma), gastritis, duodenal diverticulm  . ESOPHAGOGASTRODUODENOSCOPY N/A 01/30/2019   Procedure: ESOPHAGOGASTRODUODENOSCOPY (EGD);  Surgeon: Danie Binder, MD;  Benign-appearing esophageal stenosis, single 4 mm sessile polyp (hyperplastic), mild gastritis, normal examined duodenum (pathology- Peptic duodenitis)  . EXAM UNDER ANESTHESIA WITH MANIPULATION OF SHOULDER Left 06/06/2017   Procedure: EXAM UNDER ANESTHESIA WITH MANIPULATION OF SHOULDER;  Surgeon: Carole Civil, MD;  Location: [facility];  Service: Orthopedics;  Laterality: Left;  full relaxation  . FOOT SURGERY Left 2003   hammer toe- great toe  . KNEE ARTHROSCOPY W/ MENISCECTOMY     left knee  . SAVORY DILATION N/A 03/01/2017   Procedure: SAVORY DILATION;  Surgeon:  Danie Binder, MD;  Location: [facility];  Service: Endoscopy;  Laterality: N/A;  . UPPER GASTROINTESTINAL ENDOSCOPY  05/20/2020   Dr. Bryan Lemma: Benign stricture dilated 20 mm TTS balloon then fractured with forceps, 1 cm sliding-type HH, esophageal ulcer x2, Hill grade 3 valve, mild gastritis, no H. pylori.  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 Needs repeat EGD in 1 year.     Current Outpatient Medications  Medication Sig Dispense Refill  . esomeprazole (NEXIUM) 40 MG capsule Take 1 capsule (40 mg total) by mouth 2 (two) times daily before a meal. 60 capsule 5  . hydrocortisone (ANUSOL-HC) 2.5 % rectal cream Place 1 application rectally 4 (four) times daily. Apply small pea sized amount per your rectum 2-4 times daily x10-14 days. 30 g 1  . ibuprofen (ADVIL) 800 MG tablet Take 1 tablet (800 mg total) by mouth every 8 (eight) hours as needed. 90 tablet 1  . lidocaine (XYLOCAINE) 5 % ointment Apply 1 application topically 2 (two) times daily as needed. Apply a small pea sized amount per rectum twice daily for rectal pain related to hemorrhoids. 30 g 1  . rifaximin (XIFAXAN) 550 MG TABS tablet Take 1 tablet (550 mg total) by mouth 3 (three) times daily. 42 tablet 0  . rosuvastatin (CRESTOR) 5 MG tablet TAKE 1 TABLET BY MOUTH ON MONDAY, WEDNESDAY AND FRIDAY FOR CHOLESTEROL. 12 tablet 3  . zolpidem (AMBIEN) 5 MG tablet Take one tablet po qhs prn sleep  30 tablet 2  . polyethylene glycol-electrolytes (TRILYTE) 420 g solution Take 4,000 mLs by mouth as directed. 4000 mL 0   No current facility-administered medications for this visit.    Allergies as of 02/22/2021 - Review Complete 02/22/2021  Allergen Reaction Noted  . Azithromycin  11/23/2013  . Levaquin [levofloxacin in d5w]  02/04/2015    Family History  Problem Relation Age of Onset  . Heart disease Other   . Cancer Other   . Colon cancer Neg Hx   . Esophageal cancer Neg Hx   . Rectal cancer Neg Hx   . Stomach  cancer Neg Hx     Social History   Socioeconomic History  . Marital status: Married    Spouse name: Not on file  . Number of children: Not on file  . Years of education: 12th grade  . Highest education level: Not on file  Occupational History  . 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 Smoking status: Current Every Day Smoker    Packs/day: 0.50    Years: 25.00    Pack years: 12.50    Types: Cigarettes  . Smokeless tobacco: Never Used  . Tobacco comment: Smokes 5 cigs daily  Vaping Use  . Vaping Use: Never used  Substance and Sexual Activity  . Alcohol use: No  . Drug use: No  . Sexual activity: Yes    Birth control/protection: Surgical  Other Topics Concern  . Not on file  Social History Narrative  . Not on file   Social Determinants of Health   Financial Resource Strain: Not on file  Food Insecurity: Not on file  Transportation Needs: Not on file  Physical Activity: Not on file  Stress: Not on file  Social Connections: Not on file    Review of Systems: Gen: Denies fever, chills, cold or flulike symptoms, lightheadedness, dizziness, presyncope, syncope. CV: Denies chest pain or palpitations. Resp: Denies dyspnea or cough. GI: See HPI Heme: See HPI  Physical Exam: BP 116/71   Pulse 96   Temp (!) 97.3 F (36.3 C)   Ht 5\' 2"  (1.575 m)   Wt 106 lb 9.6 oz (48.4 kg)   BMI 19.50 kg/m  General:   Alert and oriented. No distress noted. Pleasant and cooperative.  Head:  Normocephalic and atraumatic. Eyes:  Conjuctiva clear without scleral icterus. Heart:  S1, S2 present without murmurs appreciated. Lungs:  Clear to auscultation bilaterally. No wheezes, rales, or rhonchi. No distress.  Abdomen:  +BS, soft, non-tender and non-distended. No rebound or guarding. No HSM or masses noted. Rectal: External hemorrhoid tissue/skin tag, no anal fissure appreciated, maybe some small internal hemorrhoids. No masses appreciated.  Msk:  Symmetrical  without gross deformities. Normal posture. Extremities:  Without edema. Neurologic:  Alert and  oriented x4 Psych: Normal mood and affect.   Assessment: 55 year old female with history of GERD, chronic belching, Barrett's esophagus due for repeat EGD in June 2022, ulcerative esophagitis, dysphagia with peptic stricture and esophageal stenosis dilated previously, gastric polyps (tubular adenoma) in 2018, duodenal diverticulum presenting today for follow-up of belching, flatulence, constipation, rectal pain, and reporting dysphagia. Also in need of colon cancer screening.   Belching/flatulence: Chronic.  Extensive evaluation thus far. No evidence of celiac disease on duodenal biopsies in 2020. Repeat EGD, manometry, ph/impeedance testing in June 2021 revealing ulcerative esophagitis with evidence of Barrett's esophagus, gastritis, no pathologic gastroesophageal reflux on pH/impedance, manometry normal.  Suspected symptoms could be secondary to  supragastric belching with recommendations to trial baclofen.  Unfortunately, baclofen has not been helpful.  She has noticed the most improvement with Nexium twice daily.  In general, she has very rare breakthrough typical GERD symptoms though belching continues.  Notes belching seems to coincide closely with quantity of food rather than types of food.  She has tried minimizing several food items at this point without significant change. Notably, she also reports having quite a bit of gas/flatulence. Query whether she may have SIBO.   I had originally planned to send her for hydrogen breath test.  However, I was informed we no longer offer this.  I will opt to go ahead and treat her with Xifaxan 3 times daily x14 days.  She will also continue Nexium 40 mg twice daily.  Advise she discontinue baclofen as this has not been helpful.  Dysphagia: History of dysphagia requiring esophageal dilation.  Last EGD in June 2021 withbenign-appearing esophageal stenosis s/p  dilated and biopsied, esophageal ulcers x2 s/p biopsied.  Evidence of nondysplastic Barrett's esophagus on biopsy with recommendations repeat EGD in 1 year.  Notably, patient reports return of dysphagia over the last couple of weeks with 6-7 episodes of foods not going down her esophagus completely.  No regurgitation.  Items will pass with liquids.  Notably, typical GERD symptoms are well controlled on Nexium 40 mg  We will plan to go ahead and move forward with EGD +/- dilation at this time for dysphagia as well as follow-up on Barrett's esophagus.   Constipation: Chronic. Not adequately managed. Tried and failed benefiber and MiraLAX. Currently using mag citrate which is causing watery stools. We will plan to try her on Linzess 72 mcg daily.   Rectal Pain: Recently with throbbing rectal discomfort in the setting of constipation and likely secondary to hemorrhoids. Responds very well to anusol rectal cream. No brbpr or melena. Patient has never had a colonoscopy. We will plan to proceed with colonoscopy for colon cancer screening. This will also help evaluate for other etiology of rectal pain.   Colon Cancer Screening: No prior colonoscopy. No family history of colon cancer. Chronic history of constipation and recently with rectal pain likely secondary to hemorrhoids as per above. We will plan to proceed with colonoscopy in the near future.   Plan:  1.  Xifaxan 550 mg 3 times daily x14 days. 2.  Stop baclofen. 3.  Continue Nexium 40 mg twice daily. 4.  Counseled on GERD diet/lifestyle.  Written instructions provided. 5.  Again advised to avoid common gas producing items.  Written instructions and separate handout provided. 6.  Avoid NSAIDs. 7.  Start Linzess 72 mcg daily 30 minutes before first meal.  Samples provided.  Requested 1-2-week progress report. 8.  Continue using Anusol rectal cream BID as needed for 7-10 days at a time. 9.  Proceed with EGD +/- dilation + colonoscopy with propofol  with Dr. Abbey Chatters in the near future. The risks, benefits, and alternatives have been discussed with the patient in detail. The patient states understanding and desires to proceed.  ASA II *Stressed the importance of bowels moving well and the need to call with a progress report on efficacy of Linzess in order to ensure a good colon prep. 10.  Follow-up in 3 months with Dr. Abbey Chatters.   Aliene Altes, PA-C [facility] 02/22/2021

## 2021-02-21 NOTE — Progress Notes (Signed)
Referring Provider: Kathyrn Drown, MD Primary Care Physician:  Kathyrn Drown, MD Primary GI Physician: Dr. Abbey Chatters  Chief Complaint  Patient presents with  . Gastroesophageal Reflux    Still having some belching and gas but not as much    HPI:   Carla Wise is a 55 y.o. female presenting today for follow-up. History of GERD with belching and minty sensation in esophagus,barrett's esophagus and ulcerative esophagitis due for repeat EGD in June 2022, dyspepsia,dysphagia with peptic stricture, gastritis, gastric polyps (tubular adenoma) in 2018, duodenal diverticulum, and constipation.  Has been on multiple PPIs for GERD including Protonix 40 mg daily, omeprazole 20 mg twice daily, Dexilant 60 mg daily, and now nexium 40 mg twice daily. GES also orderedandwithin normal limits on 04/25/20.Previously referred to [facility] for possible TIF.   She established with Dr. Bryan Lemma in June 2021. 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 Needs repeat EGD in 1 year.  Manometry 07/27/20: Normal.  Ph/impeedance 07/28/20:No evidence of pathologic gastroesophageal reflux.Reflux symptoms slightly elevated, predominantly weekly. Increase weekly acid reflux symptoms could be secondary to supragastric belching. Recommended trial of baclofen.  Follow-up telemedicine visit 08/16/20.Patient preferred medication management rather than antireflux surgery. She was started on baclofen 5 mg. Stated she can titrate up to TID as tolerated and depending on efficacy. Resume omeprazole. Follow-up prn.   Last seen in our office 01/09/2021.  She was taking Nexium twice daily but had not started baclofen as she was confused about her  medications.  Symptoms of belching and minty sensation come and go, present 5/7 days a week, not as severe with trying to limit gas producing items and eating slower.  Later stated she had started eating what she wants because she wants to live and it seemed she continued to have symptoms regardless of what she ate.  Also with increased flatulence.  Try to limit beans.  Eating green beans, total grains, boiled chicken.  Drinking 8 ounces of [facility] daily.  She never had a colonoscopy.  She was interested in having this completed in June when she also would need EGD.  Plan to continue Nexium twice daily, start baclofen 5 mg daily and increase to 3 times daily as tolerated, add daily probiotic, continue to avoid gas producing items, follow-up in 4-6 weeks.  Consider treatment for SIBO if no significant improvement.  Today: Baclofen didn't make a difference in belching. Still with intermittent symptoms several days a week though not as bad with Nexium and limiting portion sizes. States it doesn't seem to matter what she eats, it is more related to the portion size. The less she eats, the less belching she gets. Beano wasn't helpful. Probiotic not helpful. Had a little burning in her throat the other day when she ate onion, but other wise, no GERD symptoms typically.  Over the last couple weeks, she has experienced dysphagia 6-7 times with foods not going down her esophagus all the way.  No regurgitation.  They will pass with liquids.  Also increased flatulence/gas.  Worsens with meals but also occurs without eating.  Constipation: Her baseline is BM every 4-5 days.  MiraLAX and Benefiber unhelpful.  Recently started mag citrate.  With this, she has watery stools.  She had significant throbbing rectal pain in the latter  part of February along with worsening constipation and was seen in the emergency room for this.  Diagnosed with hemorrhoids.  She called our office and was prescribed Anusol rectal cream.   States she uses cream for about 4 days and symptoms improved.  She had a little rectal discomfort yesterday morning, but this resolved with Anusol.  Denies rectal bleeding or melena.  No abdominal pain.   Consider GES.   Past Medical History:  Diagnosis Date  . Barrett's esophagus   . Constipation   . Gastric adenoma 02/2017  . GERD (gastroesophageal reflux disease)   . PONV (postoperative nausea and vomiting)     Past Surgical History:  Procedure Laterality Date  . [place] STUDY N/A 07/28/2020   Procedure: [place] STUDY;  Surgeon: Lavena Bullion, DO;  No evidence of pathologic gastroesophageal reflux.Reflux symptoms slightly elevated, predominantly weekly. Increase weekly acid reflux symptoms could be secondary to supragastric belching. Recommended trial of baclofen, not helpful  . BIOPSY  01/30/2019   Procedure: BIOPSY;  Surgeon: Danie Binder, MD;  Location: [facility];  Service: Endoscopy;;  . ESOPHAGEAL MANOMETRY N/A 07/27/2020   Procedure: ESOPHAGEAL MANOMETRY (EM);  Surgeon: Lavena Bullion, DO;  Normal  . ESOPHAGOGASTRODUODENOSCOPY N/A 03/01/2017   Dr. Oneida Alar: benign-appearing peptic stricture s/p dilation, gastric polyp (tubular adenoma), gastritis, duodenal diverticulm  . ESOPHAGOGASTRODUODENOSCOPY N/A 01/30/2019   Procedure: ESOPHAGOGASTRODUODENOSCOPY (EGD);  Surgeon: Danie Binder, MD;  Benign-appearing esophageal stenosis, single 4 mm sessile polyp (hyperplastic), mild gastritis, normal examined duodenum (pathology- Peptic duodenitis)  . EXAM UNDER ANESTHESIA WITH MANIPULATION OF SHOULDER Left 06/06/2017   Procedure: EXAM UNDER ANESTHESIA WITH MANIPULATION OF SHOULDER;  Surgeon: Carole Civil, MD;  Location: [facility] ORS;  Service: Orthopedics;  Laterality: Left;  full relaxation  . FOOT SURGERY Left 2003   hammer toe- great toe  . KNEE ARTHROSCOPY W/ MENISCECTOMY     left knee  . SAVORY DILATION N/A 03/01/2017   Procedure: SAVORY DILATION;  Surgeon:  Danie Binder, MD;  Location: [facility];  Service: Endoscopy;  Laterality: N/A;  . UPPER GASTROINTESTINAL ENDOSCOPY  05/20/2020   Dr. Bryan Lemma: Benign stricture dilated 20 mm TTS balloon then fractured with forceps, 1 cm sliding-type HH, esophageal ulcer x2, Hill grade 3 valve, mild gastritis, no H. pylori.  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 Needs repeat EGD in 1 year.     Current Outpatient Medications  Medication Sig Dispense Refill  . esomeprazole (NEXIUM) 40 MG capsule Take 1 capsule (40 mg total) by mouth 2 (two) times daily before a meal. 60 capsule 5  . hydrocortisone (ANUSOL-HC) 2.5 % rectal cream Place 1 application rectally 4 (four) times daily. Apply small pea sized amount per your rectum 2-4 times daily x10-14 days. 30 g 1  . ibuprofen (ADVIL) 800 MG tablet Take 1 tablet (800 mg total) by mouth every 8 (eight) hours as needed. 90 tablet 1  . lidocaine (XYLOCAINE) 5 % ointment Apply 1 application topically 2 (two) times daily as needed. Apply a small pea sized amount per rectum twice daily for rectal pain related to hemorrhoids. 30 g 1  . rifaximin (XIFAXAN) 550 MG TABS tablet Take 1 tablet (550 mg total) by mouth 3 (three) times daily. 42 tablet 0  . rosuvastatin (CRESTOR) 5 MG tablet TAKE 1 TABLET BY MOUTH ON MONDAY, WEDNESDAY AND FRIDAY FOR CHOLESTEROL. 12 tablet 3  . zolpidem (AMBIEN) 5 MG tablet Take one tablet po qhs prn sleep  30 tablet 2  . polyethylene glycol-electrolytes (TRILYTE) 420 g solution Take 4,000 mLs by mouth as directed. 4000 mL 0   No current facility-administered medications for this visit.    Allergies as of 02/22/2021 - Review Complete 02/22/2021  Allergen Reaction Noted  . Azithromycin  11/23/2013  . Levaquin [levofloxacin in d5w]  02/04/2015    Family History  Problem Relation Age of Onset  . Heart disease Other   . Cancer Other   . Colon cancer Neg Hx   . Esophageal cancer Neg Hx   . Rectal cancer Neg Hx   . Stomach  cancer Neg Hx     Social History   Socioeconomic History  . Marital status: Married    Spouse name: Not on file  . Number of children: Not on file  . Years of education: 12th grade  . Highest education level: Not on file  Occupational History  . 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 Smoking status: Current Every Day Smoker    Packs/day: 0.50    Years: 25.00    Pack years: 12.50    Types: Cigarettes  . Smokeless tobacco: Never Used  . Tobacco comment: Smokes 5 cigs daily  Vaping Use  . Vaping Use: Never used  Substance and Sexual Activity  . Alcohol use: No  . Drug use: No  . Sexual activity: Yes    Birth control/protection: Surgical  Other Topics Concern  . Not on file  Social History Narrative  . Not on file   Social Determinants of Health   Financial Resource Strain: Not on file  Food Insecurity: Not on file  Transportation Needs: Not on file  Physical Activity: Not on file  Stress: Not on file  Social Connections: Not on file    Review of Systems: Gen: Denies fever, chills, cold or flulike symptoms, lightheadedness, dizziness, presyncope, syncope. CV: Denies chest pain or palpitations. Resp: Denies dyspnea or cough. GI: See HPI Heme: See HPI  Physical Exam: BP 116/71   Pulse 96   Temp (!) 97.3 F (36.3 C)   Ht 5\' 2"  (1.575 m)   Wt 106 lb 9.6 oz (48.4 kg)   BMI 19.50 kg/m  General:   Alert and oriented. No distress noted. Pleasant and cooperative.  Head:  Normocephalic and atraumatic. Eyes:  Conjuctiva clear without scleral icterus. Heart:  S1, S2 present without murmurs appreciated. Lungs:  Clear to auscultation bilaterally. No wheezes, rales, or rhonchi. No distress.  Abdomen:  +BS, soft, non-tender and non-distended. No rebound or guarding. No HSM or masses noted. Rectal: External hemorrhoid tissue/skin tag, no anal fissure appreciated, maybe some small internal hemorrhoids. No masses appreciated.  Msk:  Symmetrical  without gross deformities. Normal posture. Extremities:  Without edema. Neurologic:  Alert and  oriented x4 Psych: Normal mood and affect.   Assessment: 55 year old female with history of GERD, chronic belching, Barrett's esophagus due for repeat EGD in June 2022, ulcerative esophagitis, dysphagia with peptic stricture and esophageal stenosis dilated previously, gastric polyps (tubular adenoma) in 2018, duodenal diverticulum presenting today for follow-up of belching, flatulence, constipation, rectal pain, and reporting dysphagia. Also in need of colon cancer screening.   Belching/flatulence: Chronic.  Extensive evaluation thus far. No evidence of celiac disease on duodenal biopsies in 2020. Repeat EGD, manometry, ph/impeedance testing in June 2021 revealing ulcerative esophagitis with evidence of Barrett's esophagus, gastritis, no pathologic gastroesophageal reflux on pH/impedance, manometry normal.  Suspected symptoms could be secondary to  supragastric belching with recommendations to trial baclofen.  Unfortunately, baclofen has not been helpful.  She has noticed the most improvement with Nexium twice daily.  In general, she has very rare breakthrough typical GERD symptoms though belching continues.  Notes belching seems to coincide closely with quantity of food rather than types of food.  She has tried minimizing several food items at this point without significant change. Notably, she also reports having quite a bit of gas/flatulence. Query whether she may have SIBO.   I had originally planned to send her for hydrogen breath test.  However, I was informed we no longer offer this.  I will opt to go ahead and treat her with Xifaxan 3 times daily x14 days.  She will also continue Nexium 40 mg twice daily.  Advise she discontinue baclofen as this has not been helpful.  Dysphagia: History of dysphagia requiring esophageal dilation.  Last EGD in June 2021 withbenign-appearing esophageal stenosis s/p  dilated and biopsied, esophageal ulcers x2 s/p biopsied.  Evidence of nondysplastic Barrett's esophagus on biopsy with recommendations repeat EGD in 1 year.  Notably, patient reports return of dysphagia over the last couple of weeks with 6-7 episodes of foods not going down her esophagus completely.  No regurgitation.  Items will pass with liquids.  Notably, typical GERD symptoms are well controlled on Nexium 40 mg  We will plan to go ahead and move forward with EGD +/- dilation at this time for dysphagia as well as follow-up on Barrett's esophagus.   Constipation: Chronic. Not adequately managed. Tried and failed benefiber and MiraLAX. Currently using mag citrate which is causing watery stools. We will plan to try her on Linzess 72 mcg daily.   Rectal Pain: Recently with throbbing rectal discomfort in the setting of constipation and likely secondary to hemorrhoids. Responds very well to anusol rectal cream. No brbpr or melena. Patient has never had a colonoscopy. We will plan to proceed with colonoscopy for colon cancer screening. This will also help evaluate for other etiology of rectal pain.   Colon Cancer Screening: No prior colonoscopy. No family history of colon cancer. Chronic history of constipation and recently with rectal pain likely secondary to hemorrhoids as per above. We will plan to proceed with colonoscopy in the near future.   Plan:  1.  Xifaxan 550 mg 3 times daily x14 days. 2.  Stop baclofen. 3.  Continue Nexium 40 mg twice daily. 4.  Counseled on GERD diet/lifestyle.  Written instructions provided. 5.  Again advised to avoid common gas producing items.  Written instructions and separate handout provided. 6.  Avoid NSAIDs. 7.  Start Linzess 72 mcg daily 30 minutes before first meal.  Samples provided.  Requested 1-2-week progress report. 8.  Continue using Anusol rectal cream BID as needed for 7-10 days at a time. 9.  Proceed with EGD +/- dilation + colonoscopy with propofol  with Dr. Abbey Chatters in the near future. The risks, benefits, and alternatives have been discussed with the patient in detail. The patient states understanding and desires to proceed.  ASA II *Stressed the importance of bowels moving well and the need to call with a progress report on efficacy of Linzess in order to ensure a good colon prep. 10.  Follow-up in 3 months with Dr. Abbey Chatters.   Aliene Altes, PA-C [facility] 02/22/2021

## 2021-02-22 ENCOUNTER — Ambulatory Visit: Payer: BC Managed Care – PPO | Admitting: Gastroenterology

## 2021-02-22 ENCOUNTER — Other Ambulatory Visit: Payer: Self-pay

## 2021-02-22 ENCOUNTER — Encounter: Payer: Self-pay | Admitting: Gastroenterology

## 2021-02-22 VITALS — BP 116/71 | HR 96 | Temp 97.3°F | Ht 62.0 in | Wt 106.6 lb

## 2021-02-22 DIAGNOSIS — R142 Eructation: Secondary | ICD-10-CM

## 2021-02-22 DIAGNOSIS — K2100 Gastro-esophageal reflux disease with esophagitis, without bleeding: Secondary | ICD-10-CM | POA: Diagnosis not present

## 2021-02-22 DIAGNOSIS — R1310 Dysphagia, unspecified: Secondary | ICD-10-CM

## 2021-02-22 DIAGNOSIS — K2270 Barrett's esophagus without dysplasia: Secondary | ICD-10-CM | POA: Diagnosis not present

## 2021-02-22 DIAGNOSIS — K6289 Other specified diseases of anus and rectum: Secondary | ICD-10-CM | POA: Insufficient documentation

## 2021-02-22 DIAGNOSIS — K5900 Constipation, unspecified: Secondary | ICD-10-CM

## 2021-02-22 DIAGNOSIS — Z1211 Encounter for screening for malignant neoplasm of colon: Secondary | ICD-10-CM

## 2021-02-22 DIAGNOSIS — R143 Flatulence: Secondary | ICD-10-CM

## 2021-02-22 MED ORDER — PEG 3350-KCL-NA BICARB-NACL 420 G PO SOLR: 4000.0000 mL | ORAL | 0 refills | Status: DC

## 2021-02-22 MED ORDER — RIFAXIMIN 550 MG PO TABS: 550.0000 mg | ORAL_TABLET | Freq: Three times a day (TID) | ORAL | 0 refills | Status: DC

## 2021-02-22 NOTE — Patient Instructions (Addendum)
We will arrange for you to have a hydrogen breath test to evaluate for a condition called Small Intestinal Bacterial Overgrowth.   We will arrange for an upper endoscopy with dilation of your esophagus and colonoscopy in the near future with Dr. Abbey Chatters.  Start Linzess 72 mcg daily 30 minutes before your first meal to help with constipation.  We are providing you samples today.   Call with a progress report in 1-2 weeks.  You may stop baclofen as this is not helping with your belching.  Continue Nexium 40 mg twice daily 30 minutes before breakfast and dinner.  Follow a GERD diet:  Avoid fried, fatty, greasy, spicy, citrus foods. Avoid caffeine and carbonated beverages. Avoid chocolate. Try eating 4-6 small meals a day rather than 3 large meals. Do not eat within 3 hours of laying down. Prop head of bed up on wood or bricks to create a 6 inch incline.  Hold NSAIDs including ibuprofen, Aleve, Advil, Goody powders, BC powders, naproxen, and anything that says "NSAID" on the package.   Avoid common gas producing items including broccoli, cabbage, cauliflower, Brussels sprouts, beans, drinking through a straw, artificial sweeteners, chewing gum, hard candies. See handout below.  Continue using Anusol cream as needed for rectal discomfort related to hemorrhoids.  If you experience rectal discomfort, use Anusol cream twice daily for 7-10 days at a time.  We will plan to see back in the office after your procedures.  Aliene Altes, PA-C [facility]  Abdominal Bloating When you have abdominal bloating, your abdomen may feel full, tight, or painful. It may also look bigger than normal or swollen (distended). Common causes of abdominal bloating include:  Swallowing air.  Constipation.  Problems digesting food.  Eating too much.  Irritable bowel syndrome. This is a condition that affects the large intestine.  Lactose intolerance. This is an inability to digest lactose,  a natural sugar in dairy products.  Celiac disease. This is a condition that affects the ability to digest gluten, a protein found in some grains.  Gastroparesis. This is a condition that slows down the movement of food in the stomach and small intestine. It is more common in people with diabetes mellitus.  Gastroesophageal reflux disease (GERD). This is a digestive condition that makes stomach acid flow back into the esophagus.  Urinary retention. This means that the body is holding onto urine, and the bladder cannot be emptied all the way. Follow these instructions at home: Eating and drinking  Avoid eating too much.  Try not to swallow air while talking or eating.  Avoid eating while lying down.  Avoid these foods and drinks: ? Foods that cause gas, such as broccoli, cabbage, cauliflower, and baked beans. ? Carbonated drinks. ? Hard candy. ? Chewing gum. Medicines  Take over-the-counter and prescription medicines only as told by your health care provider.  Take probiotic medicines. These medicines contain live bacteria or yeasts that can help digestion.  Take coated peppermint oil capsules. Activity  Try to exercise regularly. Exercise may help to relieve bloating that is caused by gas and relieve constipation. General instructions  Keep all follow-up visits as told by your health care provider. This is important. Contact a health care provider if:  You have nausea and vomiting.  You have diarrhea.  You have abdominal pain.  You have unusual weight loss or weight gain.  You have severe pain, and medicines do not help. Get help right away if:  You have severe chest pain.  You have trouble breathing.  You have shortness of breath.  You have trouble urinating.  You have darker urine than normal.  You have blood in your stools or have dark, tarry stools. Summary  Abdominal bloating means that the abdomen is swollen.  Common causes of abdominal bloating  are swallowing air, constipation, and problems digesting food.  Avoid eating too much and avoid swallowing air.  Avoid foods that cause gas, carbonated drinks, hard candy, and chewing gum. This information is not intended to replace advice given to you by your health care provider. Make sure you discuss any questions you have with your health care provider. Document Revised: 03/16/2019 Document Reviewed: 12/28/2016 Elsevier Patient Education  2021 [employer].

## 2021-02-23 ENCOUNTER — Telehealth: Payer: Self-pay

## 2021-02-23 ENCOUNTER — Encounter: Payer: Self-pay | Admitting: Gastroenterology

## 2021-02-23 NOTE — Telephone Encounter (Signed)
Vickie Epley I got over a PA from [facility] but on the pt's medication list where Doreene Nest is listed at the bottom near your name it has "prohibited". Was this something done so pt could not get this Rx again or what. Please advise.

## 2021-02-23 NOTE — Telephone Encounter (Signed)
No. Patient needs this medication for suspected SIBO. We will need to fill out the PA.

## 2021-02-23 NOTE — Telephone Encounter (Signed)
noted 

## 2021-02-23 NOTE — Telephone Encounter (Signed)
This will be Xifaxan 550 mg TID x14 days. #42 Refills 0

## 2021-02-24 NOTE — Progress Notes (Signed)
Cc'ed to pcp °

## 2021-02-27 DIAGNOSIS — B351 Tinea unguium: Secondary | ICD-10-CM | POA: Diagnosis not present

## 2021-02-27 DIAGNOSIS — M79674 Pain in right toe(s): Secondary | ICD-10-CM | POA: Diagnosis not present

## 2021-03-03 ENCOUNTER — Other Ambulatory Visit: Payer: Self-pay | Admitting: Gastroenterology

## 2021-03-03 ENCOUNTER — Telehealth: Payer: Self-pay

## 2021-03-03 DIAGNOSIS — K5900 Constipation, unspecified: Secondary | ICD-10-CM

## 2021-03-03 MED ORDER — LINACLOTIDE 72 MCG PO CAPS: 72.0000 ug | ORAL_CAPSULE | Freq: Every day | ORAL | 3 refills | Status: DC

## 2021-03-03 NOTE — Telephone Encounter (Signed)
Routing to [facility].  Spoke to pt.  She would like to reschedule to sometime sooner.

## 2021-03-03 NOTE — Telephone Encounter (Signed)
Rx for Linzess 72 mcg daily was sent to her pharmacy. She should let us know if she has any trouble getting the medication.   Carla Wise, can we make sure she is scheduled for follow-up with Dr. Abbey Chatters after her procedures? I believe I requested 3 month return. Just want to make sure she gets scheduled with him.

## 2021-03-03 NOTE — Telephone Encounter (Signed)
Spoke with pt. Pt is aware that RX was sent into her pharmacy. Pt will call back if she has problems.

## 2021-03-03 NOTE — Telephone Encounter (Signed)
Levada Dy, please call pt. She has some questions for you.   Aliene Altes, PA- the Linzess 72 mcg is working well and pt would like a prescription sent into her pharmacy.

## 2021-03-03 NOTE — Telephone Encounter (Signed)
Called pt. She has been scheduled to 4/8 pm appt. Aware endo will call with arrival time. covid test r/s'd to 4/6 at 3:30pm. Advised will leave instructions at front desk for pick up. She already has prep at home. Called endo and made aware of appt procedure change

## 2021-03-06 ENCOUNTER — Telehealth: Payer: Self-pay | Admitting: *Deleted

## 2021-03-06 NOTE — Telephone Encounter (Signed)
Pt came by the office and informed me that Linzess was going to cost $700 per [facility].  She wants to know if we can send in something cheaper for her.

## 2021-03-06 NOTE — Telephone Encounter (Signed)
Carla Wise, can we find out if a PA is needed for Linzess? If its not a PA, can we find out what medications patients insurance will cover for constipation? Amitiza, Motegrity, Trulance?  We can provide her with a few more samples of Linzess 72 mcg while we are working on getting medication covered.

## 2021-03-06 NOTE — Telephone Encounter (Signed)
Please give her 3 boxes. Hopefully we will know something by then in regards to [place]. Thank you!

## 2021-03-06 NOTE — Telephone Encounter (Signed)
I'm already working on [employer] for the [employer] at the same time for this pt once it is approved or denied. How much Linzess do you want to give this pt

## 2021-03-07 NOTE — Telephone Encounter (Signed)
SHE IS ON A RECALL LIST FOR APPOINTMENT WITH DR Abbey Chatters IN [place]

## 2021-03-07 NOTE — Telephone Encounter (Signed)
Noted    Pt has 3 boxes of Linzess samples up front to pick up. (phoned and advised they were ready).

## 2021-03-07 NOTE — Telephone Encounter (Signed)
Noted  

## 2021-03-09 ENCOUNTER — Telehealth: Payer: Self-pay

## 2021-03-09 NOTE — Telephone Encounter (Signed)
Noted. We were trying to get Xifaxan covered for suspected SIBO.   I spoke with patient this morning. She is having some improvement in her bloating and flatulence with Linzess now that her bowels are moving better. Discussed the possibility of trying other antibiotics for SIBO vs waiting to see how she continues to do with Linzess and pending colonoscopy. She would like to hold off on antibiotics for now.   Dena FYI

## 2021-03-09 NOTE — Telephone Encounter (Signed)
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   Her notes do not support this. It contradicts because at the same time we are trying to get approved Linzess for constipation. There is nothing else we can do to get this Xifaxan. I'm deleting it from [facility] @ [phone]. The pt's [employer] is denying it.

## 2021-03-09 NOTE — Telephone Encounter (Signed)
Phoned and LM on pts vm to return call at her earliest convenience.

## 2021-03-09 NOTE — Telephone Encounter (Signed)
Pt returned call advised regarding both PA and appeals for Xifaxan and Linzess being denied. Pt was given discount cards for both medications to activate to use at her pharmacy to get these medications. Cards left up front for the pt.

## 2021-03-12 NOTE — Telephone Encounter (Signed)
Carla Wise, would she possibly be a candidate for patient assistance for Linzess?

## 2021-03-13 NOTE — Telephone Encounter (Signed)
Great idea, I will phone the pt and speak with her and let her know what details are involved (like w2, paystub).

## 2021-03-13 NOTE — Telephone Encounter (Signed)
Ok, great.

## 2021-03-15 ENCOUNTER — Other Ambulatory Visit (HOSPITAL_COMMUNITY)
Admission: RE | Admit: 2021-03-15 | Discharge: 2021-03-15 | Disposition: A | Discharge status: Home / Self Care | Payer: BC Managed Care – PPO | Source: Ambulatory Visit | Attending: Internal Medicine | Admitting: Internal Medicine

## 2021-03-15 ENCOUNTER — Other Ambulatory Visit: Payer: Self-pay

## 2021-03-15 ENCOUNTER — Telehealth: Payer: Self-pay | Admitting: Internal Medicine

## 2021-03-15 ENCOUNTER — Other Ambulatory Visit: Payer: Self-pay | Admitting: Nurse Practitioner

## 2021-03-15 DIAGNOSIS — Z79899 Other long term (current) drug therapy: Secondary | ICD-10-CM | POA: Diagnosis not present

## 2021-03-15 DIAGNOSIS — F17210 Nicotine dependence, cigarettes, uncomplicated: Secondary | ICD-10-CM | POA: Diagnosis not present

## 2021-03-15 DIAGNOSIS — R1013 Epigastric pain: Secondary | ICD-10-CM | POA: Diagnosis not present

## 2021-03-15 DIAGNOSIS — K2970 Gastritis, unspecified, without bleeding: Secondary | ICD-10-CM | POA: Diagnosis not present

## 2021-03-15 DIAGNOSIS — K222 Esophageal obstruction: Secondary | ICD-10-CM | POA: Diagnosis not present

## 2021-03-15 DIAGNOSIS — Z808 Family history of malignant neoplasm of other organs or systems: Secondary | ICD-10-CM | POA: Diagnosis not present

## 2021-03-15 DIAGNOSIS — K648 Other hemorrhoids: Secondary | ICD-10-CM | POA: Diagnosis not present

## 2021-03-15 DIAGNOSIS — K5730 Diverticulosis of large intestine without perforation or abscess without bleeding: Secondary | ICD-10-CM | POA: Diagnosis not present

## 2021-03-15 DIAGNOSIS — Z8249 Family history of ischemic heart disease and other diseases of the circulatory system: Secondary | ICD-10-CM | POA: Diagnosis not present

## 2021-03-15 DIAGNOSIS — Z881 Allergy status to other antibiotic agents status: Secondary | ICD-10-CM | POA: Diagnosis not present

## 2021-03-15 DIAGNOSIS — Z01812 Encounter for preprocedural laboratory examination: Secondary | ICD-10-CM | POA: Insufficient documentation

## 2021-03-15 DIAGNOSIS — K219 Gastro-esophageal reflux disease without esophagitis: Secondary | ICD-10-CM | POA: Diagnosis not present

## 2021-03-15 DIAGNOSIS — Z8719 Personal history of other diseases of the digestive system: Secondary | ICD-10-CM | POA: Diagnosis not present

## 2021-03-15 DIAGNOSIS — D123 Benign neoplasm of transverse colon: Secondary | ICD-10-CM | POA: Diagnosis not present

## 2021-03-15 DIAGNOSIS — Z20822 Contact with and (suspected) exposure to covid-19: Secondary | ICD-10-CM | POA: Insufficient documentation

## 2021-03-15 DIAGNOSIS — Z791 Long term (current) use of non-steroidal anti-inflammatories (NSAID): Secondary | ICD-10-CM | POA: Diagnosis not present

## 2021-03-15 DIAGNOSIS — Z1211 Encounter for screening for malignant neoplasm of colon: Secondary | ICD-10-CM | POA: Diagnosis not present

## 2021-03-15 DIAGNOSIS — Z888 Allergy status to other drugs, medicaments and biological substances status: Secondary | ICD-10-CM | POA: Diagnosis not present

## 2021-03-15 NOTE — Telephone Encounter (Signed)
Patient was to call Carla Wise if her Linzess was too expensive, after her discount card it was still $302 at the pharmacy.  Can she have some samples or can a prior auth be done with her insurance

## 2021-03-15 NOTE — Telephone Encounter (Signed)
Phoned and spoke with the pt and advised to pick up patient assistance forms and 3 boxes of Linzess 72 mcg. Pt husband will come by tomorrow and pick up these items. I advised her to get the paperwork back ASAP so she can get some assistance. She agreed.

## 2021-03-15 NOTE — Telephone Encounter (Signed)
See other phone note

## 2021-03-16 LAB — SARS CORONAVIRUS 2 (TAT 6-24 HRS): SARS Coronavirus 2: NEGATIVE

## 2021-03-17 ENCOUNTER — Ambulatory Visit (HOSPITAL_COMMUNITY): Payer: BC Managed Care – PPO | Admitting: Anesthesiology

## 2021-03-17 ENCOUNTER — Ambulatory Visit (HOSPITAL_COMMUNITY)
Admission: RE | Admit: 2021-03-17 | Discharge: 2021-03-17 | Disposition: A | Discharge status: Home / Self Care | Payer: BC Managed Care – PPO | Source: Ambulatory Visit | Attending: Internal Medicine | Admitting: Internal Medicine

## 2021-03-17 ENCOUNTER — Other Ambulatory Visit: Payer: Self-pay

## 2021-03-17 ENCOUNTER — Encounter (HOSPITAL_COMMUNITY): Payer: Self-pay

## 2021-03-17 ENCOUNTER — Encounter (HOSPITAL_COMMUNITY)
Admission: RE | Disposition: A | Discharge status: Home / Self Care | Payer: Self-pay | Source: Ambulatory Visit | Attending: Internal Medicine

## 2021-03-17 DIAGNOSIS — K30 Functional dyspepsia: Secondary | ICD-10-CM

## 2021-03-17 DIAGNOSIS — K219 Gastro-esophageal reflux disease without esophagitis: Secondary | ICD-10-CM | POA: Insufficient documentation

## 2021-03-17 DIAGNOSIS — Z888 Allergy status to other drugs, medicaments and biological substances status: Secondary | ICD-10-CM | POA: Diagnosis not present

## 2021-03-17 DIAGNOSIS — Z79899 Other long term (current) drug therapy: Secondary | ICD-10-CM | POA: Insufficient documentation

## 2021-03-17 DIAGNOSIS — Z8249 Family history of ischemic heart disease and other diseases of the circulatory system: Secondary | ICD-10-CM | POA: Diagnosis not present

## 2021-03-17 DIAGNOSIS — K635 Polyp of colon: Secondary | ICD-10-CM

## 2021-03-17 DIAGNOSIS — K2970 Gastritis, unspecified, without bleeding: Secondary | ICD-10-CM | POA: Diagnosis not present

## 2021-03-17 DIAGNOSIS — K5730 Diverticulosis of large intestine without perforation or abscess without bleeding: Secondary | ICD-10-CM | POA: Insufficient documentation

## 2021-03-17 DIAGNOSIS — D123 Benign neoplasm of transverse colon: Secondary | ICD-10-CM | POA: Diagnosis not present

## 2021-03-17 DIAGNOSIS — K2289 Other specified disease of esophagus: Secondary | ICD-10-CM | POA: Diagnosis not present

## 2021-03-17 DIAGNOSIS — R1013 Epigastric pain: Secondary | ICD-10-CM | POA: Insufficient documentation

## 2021-03-17 DIAGNOSIS — Z791 Long term (current) use of non-steroidal anti-inflammatories (NSAID): Secondary | ICD-10-CM | POA: Insufficient documentation

## 2021-03-17 DIAGNOSIS — R1310 Dysphagia, unspecified: Secondary | ICD-10-CM | POA: Diagnosis not present

## 2021-03-17 DIAGNOSIS — F17210 Nicotine dependence, cigarettes, uncomplicated: Secondary | ICD-10-CM | POA: Insufficient documentation

## 2021-03-17 DIAGNOSIS — Z808 Family history of malignant neoplasm of other organs or systems: Secondary | ICD-10-CM | POA: Insufficient documentation

## 2021-03-17 DIAGNOSIS — Z1211 Encounter for screening for malignant neoplasm of colon: Secondary | ICD-10-CM | POA: Diagnosis not present

## 2021-03-17 DIAGNOSIS — Z20822 Contact with and (suspected) exposure to covid-19: Secondary | ICD-10-CM | POA: Insufficient documentation

## 2021-03-17 DIAGNOSIS — Z8719 Personal history of other diseases of the digestive system: Secondary | ICD-10-CM | POA: Insufficient documentation

## 2021-03-17 DIAGNOSIS — K648 Other hemorrhoids: Secondary | ICD-10-CM | POA: Diagnosis not present

## 2021-03-17 DIAGNOSIS — K222 Esophageal obstruction: Secondary | ICD-10-CM | POA: Insufficient documentation

## 2021-03-17 DIAGNOSIS — F419 Anxiety disorder, unspecified: Secondary | ICD-10-CM | POA: Diagnosis not present

## 2021-03-17 DIAGNOSIS — C189 Malignant neoplasm of colon, unspecified: Secondary | ICD-10-CM | POA: Diagnosis not present

## 2021-03-17 DIAGNOSIS — Z881 Allergy status to other antibiotic agents status: Secondary | ICD-10-CM | POA: Diagnosis not present

## 2021-03-17 HISTORY — PX: ESOPHAGOGASTRODUODENOSCOPY (EGD) WITH PROPOFOL: SHX5813

## 2021-03-17 HISTORY — PX: BALLOON DILATION: SHX5330

## 2021-03-17 HISTORY — PX: POLYPECTOMY: SHX5525

## 2021-03-17 HISTORY — PX: COLONOSCOPY WITH PROPOFOL: SHX5780

## 2021-03-17 HISTORY — PX: BIOPSY: SHX5522

## 2021-03-17 SURGERY — COLONOSCOPY WITH PROPOFOL
Anesthesia: General

## 2021-03-17 MED ORDER — PROPOFOL 500 MG/50ML IV EMUL
INTRAVENOUS | Status: DC | PRN
  Administered 2021-03-17: 150 ug/kg/min via INTRAVENOUS

## 2021-03-17 MED ORDER — LACTATED RINGERS IV SOLN: INTRAVENOUS | Status: DC

## 2021-03-17 MED ORDER — PROPOFOL 10 MG/ML IV BOLUS
INTRAVENOUS | Status: DC | PRN
  Administered 2021-03-17: 50 mg via INTRAVENOUS
  Administered 2021-03-17 (×2): 20 mg via INTRAVENOUS
  Administered 2021-03-17: 30 mg via INTRAVENOUS
  Administered 2021-03-17 (×2): 20 mg via INTRAVENOUS
  Administered 2021-03-17: 50 mg via INTRAVENOUS

## 2021-03-17 NOTE — Discharge Instructions (Signed)
EGD Discharge instructions Please read the instructions outlined below and refer to this sheet in the next few weeks. These discharge instructions provide you with general information on caring for yourself after you leave the hospital. Your doctor may also give you specific instructions. While your treatment has been planned according to the most current medical practices available, unavoidable complications occasionally occur. If you have any problems or questions after discharge, please call your doctor. ACTIVITY  You may resume your regular activity but move at a slower pace for the next 24 hours.   Take frequent rest periods for the next 24 hours.   Walking will help expel (get rid of) the air and reduce the bloated feeling in your abdomen.   No driving for 24 hours (because of the anesthesia (medicine) used during the test).   You may shower.   Do not sign any important legal documents or operate any machinery for 24 hours (because of the anesthesia used during the test).  NUTRITION  Drink plenty of fluids.   You may resume your normal diet.   Begin with a light meal and progress to your normal diet.   Avoid alcoholic beverages for 24 hours or as instructed by your caregiver.  MEDICATIONS  You may resume your normal medications unless your caregiver tells you otherwise.  WHAT YOU CAN EXPECT TODAY  You may experience abdominal discomfort such as a feeling of fullness or "gas" pains.  FOLLOW-UP  Your doctor will discuss the results of your test with you.  SEEK IMMEDIATE MEDICAL ATTENTION IF ANY OF THE FOLLOWING OCCUR:  Excessive nausea (feeling sick to your stomach) and/or vomiting.   Severe abdominal pain and distention (swelling).   Trouble swallowing.   Temperature over 101 F (37.8 C).   Rectal bleeding or vomiting of blood.     Colonoscopy Discharge Instructions  Read the instructions outlined below and refer to this sheet in the next few weeks. These  discharge instructions provide you with general information on caring for yourself after you leave the hospital. Your doctor may also give you specific instructions. While your treatment has been planned according to the most current medical practices available, unavoidable complications occasionally occur.   ACTIVITY  You may resume your regular activity, but move at a slower pace for the next 24 hours.   Take frequent rest periods for the next 24 hours.   Walking will help get rid of the air and reduce the bloated feeling in your belly (abdomen).   No driving for 24 hours (because of the medicine (anesthesia) used during the test).    Do not sign any important legal documents or operate any machinery for 24 hours (because of the anesthesia used during the test).  NUTRITION  Drink plenty of fluids.   You may resume your normal diet as instructed by your doctor.   Begin with a light meal and progress to your normal diet. Heavy or fried foods are harder to digest and may make you feel sick to your stomach (nauseated).   Avoid alcoholic beverages for 24 hours or as instructed.  MEDICATIONS  You may resume your normal medications unless your doctor tells you otherwise.  WHAT YOU CAN EXPECT TODAY  Some feelings of bloating in the abdomen.   Passage of more gas than usual.   Spotting of blood in your stool or on the toilet paper.  IF YOU HAD POLYPS REMOVED DURING THE COLONOSCOPY:  No aspirin products for 7 days or as instructed.  No alcohol for 7 days or as instructed.   Eat a soft diet for the next 24 hours.  FINDING OUT THE RESULTS OF YOUR TEST Not all test results are available during your visit. If your test results are not back during the visit, make an appointment with your caregiver to find out the results. Do not assume everything is normal if you have not heard from your caregiver or the medical facility. It is important for you to follow up on all of your test results.   SEEK IMMEDIATE MEDICAL ATTENTION IF:  You have more than a spotting of blood in your stool.   Your belly is swollen (abdominal distention).   You are nauseated or vomiting.   You have a temperature over 101.   You have abdominal pain or discomfort that is severe or gets worse throughout the day.   Your EGD revealed a mild amount of inflammation in your stomach.  I took biopsies of this to rule out infection with bacteria called H. pylori.  Continue on Nexium.  You did have a Schatzki's ring and slight narrowing of your esophagus so I stretched this with the balloon.  Hopefully this helps with your swallowing.  Await pathology results, my office will contact you next week.    Your colonoscopy revealed 2 polyp(s) which I removed successfully. One of these polyps was greater than 1 cm.  Await pathology results, my office will contact you. I recommend repeating colonoscopy in 3 years for surveillance purposes.    Follow-up with GI as previously scheduled.  I hope you have a great rest of your week!  Elon Alas. Abbey Chatters, D.O. Gastroenterology and Hepatology [facility]

## 2021-03-17 NOTE — Transfer of Care (Signed)
Immediate Anesthesia Transfer of Care Note  Patient: Carla Wise  Procedure(s) Performed: COLONOSCOPY WITH PROPOFOL (N/A ) ESOPHAGOGASTRODUODENOSCOPY (EGD) WITH PROPOFOL (N/A ) BALLOON DILATION (N/A ) POLYPECTOMY BIOPSY  Patient Location: Endoscopy Unit  Anesthesia Type:General  Level of Consciousness: awake  Airway & Oxygen Therapy: Patient Spontanous Breathing  Post-op Assessment: Report given to RN  Post vital signs: Reviewed and stable  Last Vitals:  Vitals Value Taken Time  BP 121/67 03/17/21 1109  Temp 36.5 C 03/17/21 1109  Pulse 86 03/17/21 1109  Resp 22 03/17/21 1109  SpO2 98 % 03/17/21 1109    Last Pain:  Vitals:   03/17/21 1109  TempSrc: Oral  PainSc: 0-No pain      Patients Stated Pain Goal: 6 (75/05/18 3358)  Complications: No complications documented.

## 2021-03-17 NOTE — Interval H&P Note (Signed)
History and Physical Interval Note:  03/17/2021 9:54 AM  Carla Wise  has presented today for surgery, with the diagnosis of dysphagia, history of Barrett's esophagus, colon cancer screening.  The various methods of treatment have been discussed with the patient and family. After consideration of risks, benefits and other options for treatment, the patient has consented to  Procedure(s) with comments: COLONOSCOPY WITH PROPOFOL (N/A) - PM ESOPHAGOGASTRODUODENOSCOPY (EGD) WITH PROPOFOL (N/A) BALLOON DILATION (N/A) as a surgical intervention.  The patient's history has been reviewed, patient examined, no change in status, stable for surgery.  I have reviewed the patient's chart and labs.  Questions were answered to the patient's satisfaction.     [name]

## 2021-03-17 NOTE — Anesthesia Postprocedure Evaluation (Signed)
Anesthesia Post Note  Patient: Carla Wise  Procedure(s) Performed: COLONOSCOPY WITH PROPOFOL (N/A ) ESOPHAGOGASTRODUODENOSCOPY (EGD) WITH PROPOFOL (N/A ) BALLOON DILATION (N/A ) POLYPECTOMY BIOPSY  Patient location during evaluation: Endoscopy Anesthesia Type: General Level of consciousness: awake and alert and oriented Pain management: pain level controlled Vital Signs Assessment: post-procedure vital signs reviewed and stable Respiratory status: spontaneous breathing Cardiovascular status: blood pressure returned to baseline and stable Postop Assessment: no apparent nausea or vomiting Anesthetic complications: no   No complications documented.   Last Vitals:  Vitals:   03/17/21 0908 03/17/21 1109  BP:  121/67  Pulse:  86  Resp: (!) 21 (!) 22  Temp: 37.4 C 36.5 C  SpO2: 97% 98%    Last Pain:  Vitals:   03/17/21 1109  TempSrc: Oral  PainSc: 0-No pain                 Drelyn Pistilli

## 2021-03-17 NOTE — Anesthesia Preprocedure Evaluation (Signed)
Anesthesia Evaluation  Patient identified by MRN, date of birth, ID band Patient awake    Reviewed: Allergy & Precautions, H&P , NPO status , Patient's Chart, lab work & pertinent test results, reviewed documented beta blocker date and time   History of Anesthesia Complications (+) PONV and history of anesthetic complications  Airway Mallampati: II  TM Distance: >3 FB Neck ROM: full    Dental no notable dental hx. (+) Teeth Intact   Pulmonary neg pulmonary ROS, Current Smoker and Patient abstained from smoking.,    Pulmonary exam normal breath sounds clear to auscultation       Cardiovascular Exercise Tolerance: Good negative cardio ROS   Rhythm:regular Rate:Normal     Neuro/Psych PSYCHIATRIC DISORDERS Anxiety negative neurological ROS     GI/Hepatic Neg liver ROS, GERD  Medicated,  Endo/Other  negative endocrine ROS  Renal/GU negative Renal ROS  negative genitourinary   Musculoskeletal   Abdominal   Peds  Hematology negative hematology ROS (+)   Anesthesia Other Findings   Reproductive/Obstetrics negative OB ROS                             Anesthesia Physical Anesthesia Plan  ASA: II  Anesthesia Plan: General   Post-op Pain Management:    Induction:   PONV Risk Score and Plan: Propofol infusion  Airway Management Planned:   Additional Equipment:   Intra-op Plan:   Post-operative Plan:   Informed Consent: I have reviewed the patients History and Physical, chart, labs and discussed the procedure including the risks, benefits and alternatives for the proposed anesthesia with the patient or authorized representative who has indicated his/her understanding and acceptance.     Dental Advisory Given  Plan Discussed with: CRNA  Anesthesia Plan Comments:         Anesthesia Quick Evaluation

## 2021-03-17 NOTE — Op Note (Signed)
[facility] Patient Name: Carla Wise Procedure Date: 03/17/2021 10:22 AM MRN: [id number] Date of Birth: [birth year] Attending MD: Elon Alas. Abbey Chatters DO CSN: [id number] Age: 55 Admit Type: Outpatient Procedure:                Upper GI endoscopy Indications:              Functional Dyspepsia Providers:                Elon Alas. Abbey Chatters, DO, Gwenlyn Fudge, RN, Nelma Rothman,                            Technician Referring MD:              Medicines:                See the Anesthesia note for documentation of the                            administered medications Complications:            No immediate complications. Estimated Blood Loss:     Estimated blood loss was minimal. Procedure:                Pre-Anesthesia Assessment:                           - The anesthesia plan was to use monitored                            anesthesia care (MAC).                           After obtaining informed consent, the endoscope was                            passed under direct vision. Throughout the                            procedure, the patient's blood pressure, pulse, and                            oxygen saturations were monitored continuously. The                            GIF-H190 ([id number]) scope was introduced through the                            mouth, and advanced to the second part of duodenum.                            The upper GI endoscopy was accomplished without                            difficulty. The patient tolerated the procedure                            well. Scope In: 10:33:44 AM Scope  Out: 10:40:06 AM Total Procedure Duration: 0 hours 6 minutes 22 seconds  Findings:      A mild Schatzki ring was found at the gastroesophageal junction. A TTS       dilator was passed through the scope. Dilation with an 18-19-20 mm       balloon dilator was performed to 20 mm. Cold forceps then used to       obliterate ring. Samples placed in bottle 2.      Diffuse mild  inflammation characterized by erythema was found in the       entire examined stomach. Biopsies were taken with a cold forceps for       Helicobacter pylori testing.      The duodenal bulb, first portion of the duodenum and second portion of       the duodenum were normal. Impression:               - Mild Schatzki ring. Dilated. Biopsied.                           - Gastritis. Biopsied.                           - Normal duodenal bulb, first portion of the                            duodenum and second portion of the duodenum. Moderate Sedation:      Per Anesthesia Care Recommendation:           - Patient has a contact number available for                            emergencies. The signs and symptoms of potential                            delayed complications were discussed with the                            patient. Return to normal activities tomorrow.                            Written discharge instructions were provided to the                            patient.                           - Resume previous diet.                           - Continue present medications.                           - Await pathology results.                           - Repeat upper endoscopy PRN for retreatment.                           -  Return to GI clinic as previously scheduled. Procedure Code(s):        --- Professional ---                           [phone], Esophagogastroduodenoscopy, flexible,                            transoral; with transendoscopic balloon dilation of                            esophagus (less than 30 mm diameter)                           43239, 59, Esophagogastroduodenoscopy, flexible,                            transoral; with biopsy, single or multiple Diagnosis Code(s):        --- Professional ---                           K22.2, Esophageal obstruction                           K29.70, Gastritis, unspecified, without bleeding                           K30, Functional  dyspepsia CPT copyright 2019 American Medical Association. All rights reserved. The codes documented in this report are preliminary and upon coder review may  be revised to meet current compliance requirements. Elon Alas. Abbey Chatters, DO [place] Abbey Chatters, DO 03/17/2021 10:43:23 AM This report has been signed electronically. Number of Addenda: 0

## 2021-03-17 NOTE — Op Note (Signed)
[facility] Patient Name: Carla Wise Procedure Date: 03/17/2021 10:42 AM MRN: [id number] Date of Birth: 1966/06/07 Attending MD: Elon Alas. Edgar Frisk CSN: [id number] Age: 55 Admit Type: Outpatient Procedure:                Colonoscopy Indications:              Screening for colorectal malignant neoplasm Providers:                Elon Alas. Abbey Chatters, DO, Gwenlyn Fudge, RN, Nelma Rothman,                            Technician Referring MD:              Medicines:                See the Anesthesia note for documentation of the                            administered medications Complications:            No immediate complications. Estimated Blood Loss:     Estimated blood loss was minimal. Procedure:                Pre-Anesthesia Assessment:                           - The anesthesia plan was to use monitored                            anesthesia care (MAC).                           After obtaining informed consent, the colonoscope                            was passed under direct vision. Throughout the                            procedure, the patient's blood pressure, pulse, and                            oxygen saturations were monitored continuously. The                            PCF-H190DL ([id number]) was introduced through the                            anus and advanced to the the cecum, identified by                            appendiceal orifice and ileocecal valve. The                            colonoscopy was performed without difficulty. The                            patient tolerated the procedure well. The quality  of the bowel preparation was evaluated using the                            BBPS [facility] Bowel Preparation Scale) with scores                            of: Right Colon = 3, Transverse Colon = 3 and Left                            Colon = 3 (entire mucosa seen well with no residual                            staining, small  fragments of stool or opaque                            liquid). The total BBPS score equals 9. Scope In: 10:44:16 AM Scope Out: 11:05:38 AM Scope Withdrawal Time: 0 hours 14 minutes 7 seconds  Total Procedure Duration: 0 hours 21 minutes 22 seconds  Findings:      The perianal and digital rectal examinations were normal.      Non-bleeding internal hemorrhoids were found during endoscopy.      A few small-mouthed diverticula were found in the sigmoid colon.      A 11 mm polyp was found in the hepatic flexure. The polyp was flat.       Preparations were made for mucosal resection. Eleview was injected with       adequate lift of the lesion from the muscularis propria. Margins were       well demarcated. Snare mucosal resection was performed. A 13 mm area was       resected. Resection and retrieval were complete. There was no bleeding       at the end of the procedure.      A 4 mm polyp was found in the transverse colon. The polyp was sessile.       The polyp was removed with a cold snare. Resection and retrieval were       complete.      The exam was otherwise without abnormality. Impression:               - Non-bleeding internal hemorrhoids.                           - Diverticulosis in the sigmoid colon.                           - One 11 mm polyp at the hepatic flexure, removed                            with mucosal resection. Resected and retrieved.                           - One 4 mm polyp in the transverse colon, removed                            with a cold snare. Resected and retrieved.                           -  The examination was otherwise normal.                           - Mucosal resection was performed. Resection and                            retrieval were complete. Moderate Sedation:      Per Anesthesia Care Recommendation:           - Patient has a contact number available for                            emergencies. The signs and symptoms of potential                             delayed complications were discussed with the                            patient. Return to normal activities tomorrow.                            Written discharge instructions were provided to the                            patient.                           - Resume previous diet.                           - Continue present medications.                           - Await pathology results.                           - Repeat colonoscopy in 3 years for surveillance.                           - Return to GI clinic as previously scheduled. Procedure Code(s):        --- Professional ---                           [phone], Colonoscopy, flexible; with endoscopic                            mucosal resection                           45385, 40, Colonoscopy, flexible; with removal of                            tumor(s), polyp(s), or other lesion(s) by snare                            technique Diagnosis Code(s):        --- Professional ---  Z12.11, Encounter for screening for malignant                            neoplasm of colon                           K64.8, Other hemorrhoids                           K63.5, Polyp of colon                           K57.30, Diverticulosis of large intestine without                            perforation or abscess without bleeding CPT copyright 2019 American Medical Association. All rights reserved. The codes documented in this report are preliminary and upon coder review may  be revised to meet current compliance requirements. Elon Alas. Abbey Chatters, DO [place] Abbey Chatters, DO 03/17/2021 11:08:49 AM This report has been signed electronically. Number of Addenda: 0

## 2021-03-20 LAB — SURGICAL PATHOLOGY

## 2021-03-22 ENCOUNTER — Encounter (HOSPITAL_COMMUNITY): Payer: Self-pay | Admitting: Internal Medicine

## 2021-03-30 ENCOUNTER — Telehealth: Payer: Self-pay | Admitting: Internal Medicine

## 2021-03-30 NOTE — Telephone Encounter (Signed)
Phoned and spoke with the pt advised that pt assistance can take anywhere from a week to months (it just depends). I advised the pt is more than welcome to check with [place] weekly if she had not heard from me. Pt agreed.

## 2021-03-30 NOTE — Telephone Encounter (Signed)
Please call patient regarding her [place]. [phone]

## 2021-03-31 ENCOUNTER — Other Ambulatory Visit (HOSPITAL_COMMUNITY): Payer: BC Managed Care – PPO

## 2021-04-17 ENCOUNTER — Other Ambulatory Visit: Payer: Self-pay

## 2021-04-17 ENCOUNTER — Encounter: Payer: Self-pay | Admitting: Orthopedic Surgery

## 2021-04-17 ENCOUNTER — Ambulatory Visit: Payer: BC Managed Care – PPO | Admitting: Orthopedic Surgery

## 2021-04-17 VITALS — BP 134/80 | HR 95 | Ht 61.0 in | Wt 107.0 lb

## 2021-04-17 DIAGNOSIS — M7061 Trochanteric bursitis, right hip: Secondary | ICD-10-CM | POA: Diagnosis not present

## 2021-04-17 DIAGNOSIS — M7062 Trochanteric bursitis, left hip: Secondary | ICD-10-CM

## 2021-04-17 MED ORDER — PREDNISONE 10 MG (48) PO TBPK
ORAL_TABLET | Freq: Every day | ORAL | 0 refills | Status: DC
Start: 2021-04-17 — End: 2021-05-16

## 2021-04-17 NOTE — Patient Instructions (Signed)
Ice daily   Start   Meds ordered this encounter  Medications  . predniSONE (STERAPRED UNI-PAK 48 TAB) 10 MG (48) TBPK tablet    Sig: Take by mouth daily. 12 days 10 mg as directed    Dispense:  48 tablet    Refill:  0

## 2021-04-17 NOTE — Progress Notes (Signed)
Chief Complaint  Patient presents with  . Hip Pain    Bilateral, some relief with injections last time    55 year old female with chronic trochanteric bursitis left hip now progressing to the right hip as well  Patient planes of severe pain in both hips over the greater trochanter with no history of trauma  Exam shows tenderness over the right and left greater trochanter just superiorly and a little posterior  Mild swelling over the trochanteric bursa as well  Recommend bilateral injections  Stop ibuprofen for 2 weeks start Medrol Dosepak  Meds ordered this encounter  Medications  . predniSONE (STERAPRED UNI-PAK 48 TAB) 10 MG (48) TBPK tablet    Sig: Take by mouth daily. 12 days 10 mg as directed    Dispense:  48 tablet    Refill:  0     Inject right hip Medication Celestone 6 mg lidocaine 1% 2 cc Site confirmed as right hip Patient agreed to injection Alcohol used to clean the skin and ethyl chloride used for anesthesia Injection performed  Same procedure was repeated with left hip

## 2021-04-24 ENCOUNTER — Ambulatory Visit: Payer: BC Managed Care – PPO | Admitting: Family Medicine

## 2021-04-24 ENCOUNTER — Other Ambulatory Visit: Payer: Self-pay

## 2021-04-24 ENCOUNTER — Encounter: Payer: Self-pay | Admitting: Family Medicine

## 2021-04-24 VITALS — HR 89 | Temp 98.0°F | Wt 102.6 lb

## 2021-04-24 DIAGNOSIS — J40 Bronchitis, not specified as acute or chronic: Secondary | ICD-10-CM | POA: Diagnosis not present

## 2021-04-24 DIAGNOSIS — H66001 Acute suppurative otitis media without spontaneous rupture of ear drum, right ear: Secondary | ICD-10-CM | POA: Diagnosis not present

## 2021-04-24 DIAGNOSIS — H9203 Otalgia, bilateral: Secondary | ICD-10-CM | POA: Diagnosis not present

## 2021-04-24 MED ORDER — AMOXICILLIN-POT CLAVULANATE 875-125 MG PO TABS: 1.0000 | ORAL_TABLET | Freq: Two times a day (BID) | ORAL | 0 refills | Status: DC

## 2021-04-24 MED ORDER — ALBUTEROL SULFATE HFA 108 (90 BASE) MCG/ACT IN AERS: 2.0000 | INHALATION_SPRAY | Freq: Four times a day (QID) | RESPIRATORY_TRACT | 0 refills | Status: DC | PRN

## 2021-04-24 NOTE — Progress Notes (Signed)
Patient ID: Carla Wise, female    DOB: May 28, 1966, 55 y.o.   MRN: [id number]   Chief Complaint  Patient presents with  . Ear Pain    And burning like from drainage since Wednesday    Subjective:  CC: ear pain both sides   This is a new problem.  Presents today for an acute visit with a complaint of ear pain and burning.  Symptoms have been present since Wednesday.  Difficult to describe symptoms, feels like she is getting an upper respiratory infection with ear pain.  Denies fever, chills, chest pain, shortness of breath.  Endorses ear pain and an occasional cough.    Medical History Levern has a past medical history of Barrett's esophagus, Constipation, Gastric adenoma (02/2017), GERD (gastroesophageal reflux disease), and PONV (postoperative nausea and vomiting).   Outpatient Encounter Medications as of 04/24/2021  Medication Sig  . albuterol (VENTOLIN HFA) 108 (90 Base) MCG/ACT inhaler Inhale 2 puffs into the lungs every 6 (six) hours as needed for wheezing or shortness of breath.  Marland Kitchen amoxicillin-clavulanate (AUGMENTIN) 875-125 MG tablet Take 1 tablet by mouth 2 (two) times daily.  Marland Kitchen esomeprazole (NEXIUM) 40 MG capsule Take 1 capsule (40 mg total) by mouth 2 (two) times daily before a meal. (Patient taking differently: Take 40 mg by mouth daily as needed (acid reflux).)  . hydrocortisone (ANUSOL-HC) 2.5 % rectal cream Place 1 application rectally 4 (four) times daily. Apply small pea sized amount per your rectum 2-4 times daily x10-14 days. (Patient taking differently: Place 1 application rectally 4 (four) times daily as needed for hemorrhoids or anal itching.)  . ibuprofen (ADVIL) 800 MG tablet Take 1 tablet (800 mg total) by mouth every 8 (eight) hours as needed.  . lidocaine (XYLOCAINE) 5 % ointment Apply 1 application topically 2 (two) times daily as needed. Apply a small pea sized amount per rectum twice daily for rectal pain related to hemorrhoids.  Marland Kitchen linaclotide (LINZESS)  72 MCG capsule Take 1 capsule (72 mcg total) by mouth daily before breakfast.  . polyethylene glycol-electrolytes (TRILYTE) 420 g solution Take 4,000 mLs by mouth as directed.  . predniSONE (STERAPRED UNI-PAK 48 TAB) 10 MG (48) TBPK tablet Take by mouth daily. 12 days 10 mg as directed  . rifaximin (XIFAXAN) 550 MG TABS tablet Take 1 tablet (550 mg total) by mouth 3 (three) times daily.  . rosuvastatin (CRESTOR) 5 MG tablet TAKE 1 TABLET BY MOUTH ON MONDAY, WEDNESDAY AND FRIDAY FOR CHOLESTEROL. (Patient taking differently: Take 5 mg by mouth every Monday, Wednesday, and Friday.)  . zolpidem (AMBIEN) 5 MG tablet TAKE ONE TABLET BY MOUTH AT BEDTIME AS NEEDED. DO NOT TAKE WITH XANAX.   No facility-administered encounter medications on file as of 04/24/2021.     Review of Systems  Constitutional: Negative for chills, fatigue and fever.  HENT: Positive for ear pain. Negative for congestion, rhinorrhea, sinus pressure, sinus pain and sore throat.   Respiratory: Positive for cough (occassional ).   Gastrointestinal: Negative for abdominal pain.  Neurological: Negative for headaches.     Vitals Pulse 89   Temp 98 F (36.7 C) (Oral)   Wt 102 lb 9.6 oz (46.5 kg)   SpO2 99%   BMI 19.39 kg/m   Objective:   Physical Exam Vitals reviewed.  Constitutional:      Appearance: Normal appearance.  HENT:     Right Ear: Tympanic membrane is erythematous.     Left Ear: Tympanic membrane normal.  Nose:     Right Turbinates: Swollen.     Left Turbinates: Swollen.     Right Sinus: No maxillary sinus tenderness or frontal sinus tenderness.     Left Sinus: No maxillary sinus tenderness or frontal sinus tenderness.     Mouth/Throat:     Pharynx: Posterior oropharyngeal erythema present. No oropharyngeal exudate.     Tonsils: 0 on the right. 0 on the left.  Cardiovascular:     Rate and Rhythm: Normal rate and regular rhythm.     Heart sounds: Normal heart sounds.  Pulmonary:     Effort:  Pulmonary effort is normal.     Breath sounds: Wheezing present.  Skin:    General: Skin is warm and dry.  Neurological:     General: No focal deficit present.     Mental Status: She is alert.  Psychiatric:        Behavior: Behavior normal.      Assessment and Plan   1. Otalgia of both ears  2. Non-recurrent acute suppurative otitis media of right ear without spontaneous rupture of tympanic membrane - amoxicillin-clavulanate (AUGMENTIN) 875-125 MG tablet; Take 1 tablet by mouth 2 (two) times daily.  Dispense: 20 tablet; Refill: 0  3. Bronchitis with wheezing - albuterol (VENTOLIN HFA) 108 (90 Base) MCG/ACT inhaler; Inhale 2 puffs into the lungs every 6 (six) hours as needed for wheezing or shortness of breath.  Dispense: 8 g; Refill: 0   Right TM erythematous with pain, will treat with antibiotic for 10 days.  Patient is a smoker, expiratory wheezing noted upon physical exam, will treat with albuterol 2 inhalations every 6 hours as needed for wheezing.  Reports that she has cut her smoking down to 5 to 6 cigarettes/day.  Agrees with plan of care discussed today. Understands warning signs to seek further care: chest pain, shortness of breath, any significant change in health.  Understands to follow-up if symptoms worsen or do not improve. Encourage smoking cessation, she has made progress.   Pecolia Ades, NP 04/24/21

## 2021-04-24 NOTE — Patient Instructions (Signed)
Otitis Media, Adult  Otitis media is a condition in which the middle ear is red and swollen (inflamed) and full of fluid. The middle ear is the part of the ear that contains bones for hearing as well as air that helps send sounds to the brain. The condition usually goes away on its own. What are the causes? This condition is caused by a blockage in the eustachian tube. The eustachian tube connects the middle ear to the back of the nose. It normally allows air into the middle ear. The blockage is caused by fluid or swelling. Problems that can cause blockage include:  A cold or infection that affects the nose, mouth, or throat.  Allergies.  An irritant, such as tobacco smoke.  Adenoids that have become large. The adenoids are soft tissue located in the back of the throat, behind the nose and the roof of the mouth.  Growth or swelling in the upper part of the throat, just behind the nose (nasopharynx).  Damage to the ear caused by change in pressure. This is called barotrauma. What are the signs or symptoms? Symptoms of this condition include:  Ear pain.  Fever.  Problems with hearing.  Being tired.  Fluid leaking from the ear.  Ringing in the ear. How is this treated? This condition can go away on its own within 3-5 days. But if the condition is caused by bacteria or does not go away on its own, or if it keeps coming back, your doctor may:  Give you antibiotic medicines.  Give you medicines for pain. Follow these instructions at home:  Take over-the-counter and prescription medicines only as told by your doctor.  If you were prescribed an antibiotic medicine, take it as told by your doctor. Do not stop taking the antibiotic even if you start to feel better.  Keep all follow-up visits as told by your doctor. This is important. Contact a doctor if:  You have bleeding from your nose.  There is a lump on your neck.  You are not feeling better in 5 days.  You feel worse  instead of better. Get help right away if:  You have pain that is not helped with medicine.  You have swelling, redness, or pain around your ear.  You get a stiff neck.  You cannot move part of your face (paralysis).  You notice that the bone behind your ear hurts when you touch it.  You get a very bad headache. Summary  Otitis media means that the middle ear is red, swollen, and full of fluid.  This condition usually goes away on its own.  If the problem does not go away, treatment may be needed. You may be given medicines to treat the infection or to treat your pain.  If you were prescribed an antibiotic medicine, take it as told by your doctor. Do not stop taking the antibiotic even if you start to feel better.  Keep all follow-up visits as told by your doctor. This is important. This information is not intended to replace advice given to you by your health care provider. Make sure you discuss any questions you have with your health care provider. Document Revised: 10/29/2019 Document Reviewed: 10/29/2019 Elsevier Patient Education  2021 Elsevier Inc.  

## 2021-05-01 ENCOUNTER — Other Ambulatory Visit: Payer: Self-pay | Admitting: Orthopedic Surgery

## 2021-05-01 ENCOUNTER — Telehealth: Payer: Self-pay | Admitting: Orthopedic Surgery

## 2021-05-01 DIAGNOSIS — M79674 Pain in right toe(s): Secondary | ICD-10-CM | POA: Diagnosis not present

## 2021-05-01 DIAGNOSIS — B351 Tinea unguium: Secondary | ICD-10-CM | POA: Diagnosis not present

## 2021-05-01 MED ORDER — IBUPROFEN 800 MG PO TABS: 800.0000 mg | ORAL_TABLET | Freq: Three times a day (TID) | ORAL | 1 refills | Status: DC | PRN

## 2021-05-01 MED ORDER — GABAPENTIN 100 MG PO CAPS: 100.0000 mg | ORAL_CAPSULE | Freq: Three times a day (TID) | ORAL | 2 refills | Status: DC

## 2021-05-01 NOTE — Telephone Encounter (Signed)
I called her Told her if not better in a couple weeks on meds to make appointment for follow up

## 2021-05-01 NOTE — Telephone Encounter (Signed)
Will try gabapentin and ibuprofen   DO NOT NEED new appt yet

## 2021-05-01 NOTE — Telephone Encounter (Signed)
Patient states no better after prednisone. Wants to know what else she can do used prednisone taper AND had injection troch. [place] still painful and swollen  She wants to know what she can do next. She states you told her to call

## 2021-05-01 NOTE — Progress Notes (Signed)
Meds ordered this encounter  Medications  . gabapentin (NEURONTIN) 100 MG capsule    Sig: Take 1 capsule (100 mg total) by mouth 3 (three) times daily.    Dispense:  90 capsule    Refill:  2  . ibuprofen (ADVIL) 800 MG tablet    Sig: Take 1 tablet (800 mg total) by mouth every 8 (eight) hours as needed.    Dispense:  90 tablet    Refill:  1  . ibuprofen (ADVIL) 800 MG tablet    Sig: Take 1 tablet (800 mg total) by mouth every 8 (eight) hours as needed.    Dispense:  90 tablet    Refill:  1

## 2021-05-01 NOTE — Telephone Encounter (Signed)
Patient called following last visit 04/17/21, and said was advised to let Dr Aline Brochure know how she is doing - asking if she is to set up a virtual visit or a return call only - ph# [phone]

## 2021-05-15 ENCOUNTER — Telehealth: Payer: Self-pay | Admitting: Orthopedic Surgery

## 2021-05-15 NOTE — Telephone Encounter (Signed)
Bring her in tues pm at 130 for exam so I can order mri

## 2021-05-15 NOTE — Telephone Encounter (Signed)
Carla Wise called and stated that she saw Dr. Aline Brochure at a graduation party and he told her to call the office.  She said that her hip was not better.  It is still swollen and painful.  I offered to schedule her an appointment but she said she thought he wanted to maybe schedule some kind of test for her.  Can you get with him regarding this patient?  Thanks

## 2021-05-16 ENCOUNTER — Ambulatory Visit: Payer: BC Managed Care – PPO | Admitting: Orthopedic Surgery

## 2021-05-16 ENCOUNTER — Ambulatory Visit: Payer: BC Managed Care – PPO

## 2021-05-16 ENCOUNTER — Other Ambulatory Visit: Payer: Self-pay

## 2021-05-16 ENCOUNTER — Encounter: Payer: Self-pay | Admitting: Orthopedic Surgery

## 2021-05-16 VITALS — Ht 61.0 in | Wt 105.0 lb

## 2021-05-16 DIAGNOSIS — M25552 Pain in left hip: Secondary | ICD-10-CM

## 2021-05-16 DIAGNOSIS — M545 Low back pain, unspecified: Secondary | ICD-10-CM | POA: Diagnosis not present

## 2021-05-16 DIAGNOSIS — M25551 Pain in right hip: Secondary | ICD-10-CM | POA: Diagnosis not present

## 2021-05-16 DIAGNOSIS — M7062 Trochanteric bursitis, left hip: Secondary | ICD-10-CM

## 2021-05-16 DIAGNOSIS — M7061 Trochanteric bursitis, right hip: Secondary | ICD-10-CM

## 2021-05-16 NOTE — Telephone Encounter (Signed)
Patient called; relayed/done.

## 2021-05-16 NOTE — Progress Notes (Signed)
Follow-up  Chief Complaint  Patient presents with  . Hip Pain    Bilateral / no relief with injections c/o pain and swelling     55 year old female treated for trochanteric bursitis with multiple injections in the left and right hip comes in unable to sleep at night with bilateral lower back pain radiating into the left and right greater trochanter left worse than right  Patient says her back does not hurt but she has some tenderness in the buttock area  Her left and right hip exam shows normal hip flexion extension normal leg lengths no pain with internal rotation flexion adduction test is normal  She complains of persistent swelling over both hips over both greater trochanters  Previous treatment included ibuprofen, gabapentin, steroid Dosepaks without improvement  I did x-ray her back today and I did see some questionable spondylolysis at L5-S1 and mild scoliosis  However, exam points to the trochanteric region as the site of pain and tenderness and I would like to get an MRI of her hip  Note symptoms began in November 2021 presented to the office on December 15, 2020 then again on Apr 17, 2021

## 2021-05-17 ENCOUNTER — Telehealth: Payer: Self-pay | Admitting: Radiology

## 2021-05-17 ENCOUNTER — Ambulatory Visit: Payer: BC Managed Care – PPO | Admitting: Orthopedic Surgery

## 2021-05-17 DIAGNOSIS — M7061 Trochanteric bursitis, right hip: Secondary | ICD-10-CM

## 2021-05-17 DIAGNOSIS — M25552 Pain in left hip: Secondary | ICD-10-CM

## 2021-05-17 DIAGNOSIS — M25551 Pain in right hip: Secondary | ICD-10-CM

## 2021-05-17 NOTE — Telephone Encounter (Signed)
Will call patient to advise MRI denied by insurance she has to do physical therapy first

## 2021-05-17 NOTE — Telephone Encounter (Signed)
Advised patient will order the therapy since MRI denied by [employer] told her to make appointment for 6 weeks after she is done with therapy she voiced understanding  To you FYI

## 2021-05-23 ENCOUNTER — Encounter: Payer: Self-pay | Admitting: Internal Medicine

## 2021-05-24 ENCOUNTER — Other Ambulatory Visit: Payer: Self-pay

## 2021-05-24 ENCOUNTER — Ambulatory Visit (HOSPITAL_COMMUNITY): Payer: BC Managed Care – PPO | Attending: Orthopedic Surgery | Admitting: Physical Therapy

## 2021-05-24 DIAGNOSIS — M25652 Stiffness of left hip, not elsewhere classified: Secondary | ICD-10-CM | POA: Diagnosis not present

## 2021-05-24 DIAGNOSIS — M25552 Pain in left hip: Secondary | ICD-10-CM | POA: Insufficient documentation

## 2021-05-24 NOTE — Therapy (Signed)
[place], [place], [address] Phone: [phone]   Fax:  [phone]  Physical Therapy Evaluation  Patient Details  Name: Carla Wise MRN: [id number] Date of Birth: September 29, 1966 Referring Provider (PT): [name]   Encounter Date: 05/24/2021   PT End of Session - 05/24/21 0824     Visit Number 1    Number of Visits 12    Date for PT Re-Evaluation 07/05/21    Authorization Type [employer]    Progress Note Due on Visit 30    PT Start Time 0830    PT Stop Time 0910    PT Time Calculation (min) 40 min    Activity Tolerance Patient tolerated treatment well    Behavior During Therapy [facility] for tasks assessed/performed             Past Medical History:  Diagnosis Date   Barrett's esophagus    Constipation    Gastric adenoma 02/2017   GERD (gastroesophageal reflux disease)    PONV (postoperative nausea and vomiting)     Past Surgical History:  Procedure Laterality Date   18 HOUR [place] STUDY N/A 07/28/2020   Procedure: 24 HOUR [place] STUDY;  Surgeon: Lavena Bullion, DO;   No evidence of pathologic gastroesophageal reflux.  Reflux symptoms slightly elevated, predominantly weekly.  Increase weekly acid reflux symptoms could be secondary to supragastric belching. Recommended trial of baclofen, not helpful   BALLOON DILATION N/A 03/17/2021   Procedure: BALLOON DILATION;  Surgeon: [name], DO;  [facility];  Service: Endoscopy;  Laterality: N/A;   BIOPSY  01/30/2019   Procedure: BIOPSY;  Surgeon: Danie Binder, MD;  [facility];  Service: Endoscopy;;   BIOPSY  03/17/2021   Procedure: BIOPSY;  Surgeon: [name], DO;  [facility];  Service: Endoscopy;;  gastric GEJ   COLONOSCOPY WITH PROPOFOL N/A 03/17/2021   Procedure: COLONOSCOPY WITH PROPOFOL;  Surgeon: [name], DO;  [facility];  Service: Endoscopy;  Laterality: N/A;  PM   ESOPHAGEAL  MANOMETRY N/A 07/27/2020   Procedure: ESOPHAGEAL MANOMETRY (EM);  Surgeon: Lavena Bullion, DO;  Normal   ESOPHAGOGASTRODUODENOSCOPY N/A 03/01/2017   Dr. Oneida Alar: benign-appearing peptic stricture s/p dilation, gastric polyp (tubular adenoma), gastritis, duodenal diverticulm   ESOPHAGOGASTRODUODENOSCOPY N/A 01/30/2019   Procedure: ESOPHAGOGASTRODUODENOSCOPY (EGD);  Surgeon: Danie Binder, MD;  Benign-appearing esophageal stenosis, single 4 mm sessile polyp (hyperplastic), mild gastritis, normal examined duodenum (pathology- Peptic duodenitis)   ESOPHAGOGASTRODUODENOSCOPY (EGD) WITH PROPOFOL N/A 03/17/2021   Procedure: ESOPHAGOGASTRODUODENOSCOPY (EGD) WITH PROPOFOL;  Surgeon: [name], DO;  [facility];  Service: Endoscopy;  Laterality: N/A;   EXAM UNDER ANESTHESIA WITH MANIPULATION OF SHOULDER Left 06/06/2017   Procedure: EXAM UNDER ANESTHESIA WITH MANIPULATION OF SHOULDER;  Surgeon: Carole Civil, MD;  Location: [facility];  Service: Orthopedics;  Laterality: Left;  full relaxation   FOOT SURGERY Left 2003   hammer toe- great toe   KNEE ARTHROSCOPY W/ MENISCECTOMY     left knee   POLYPECTOMY  03/17/2021   Procedure: POLYPECTOMY;  Surgeon: [name], DO;  [facility];  Service: Endoscopy;;   SAVORY DILATION N/A 03/01/2017   Procedure: SAVORY DILATION;  Surgeon: Danie Binder, MD;  [facility];  Service: Endoscopy;  Laterality: N/A;   UPPER GASTROINTESTINAL ENDOSCOPY  05/20/2020   Dr. Bryan Lemma: Benign stricture dilated 20 mm TTS balloon then fractured  with forceps, 1 cm sliding-type HH, esophageal ulcer x2, Hill grade 3 valve, mild gastritis, no H. pylori.  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 Needs repeat EGD in 1 year.     There were no vitals filed for this visit.    Subjective Assessment - 05/24/21 0826     Subjective Pt states that this is a waste as her insurance refused the MRI until she gets therapy and that  is the only reason she is here.  She states she has iced and used a TENS unit which did not assist in her pain.  She has been progressive since January.  She has had injections which has not helped her pain.  Her Lt hip hurts as well but not as bad.    Pertinent History Lt SALK    Limitations Sitting;Standing;Walking;House hold activities;Lifting    How long can you sit comfortably? 10 minutes    How long can you stand comfortably? 30 minutes    How long can you walk comfortably? PT will start feeling pain in avout 15 minuere    Patient Stated Goals Pt has trouble getting to sleep and wakes up at least 6 times a night.    Currently in Pain? Yes    Pain Score 7    hightest 11/10;   6   Pain Location Hip    Pain Orientation Left    Pain Descriptors / Indicators Aching;Throbbing;Sharp    Pain Type Chronic pain    Pain Onset More than a month ago    Pain Frequency Constant    Aggravating Factors  walking    Pain Relieving Factors nothing    Effect of Pain on Daily Activities limits                [facility] PT Assessment - 05/24/21 0001       Assessment   Medical Diagnosis Lt hip pain    Referring Provider (PT) [name]    Prior Therapy none      Precautions   Precautions None      Restrictions   Weight Bearing Restrictions No      Balance Screen   Has the patient fallen in the past 6 months No    Has the patient had a decrease in activity level because of a fear of falling?  Yes    Is the patient reluctant to leave their home because of a fear of falling?  No      Home Ecologist residence      Prior Function   Level of Independence Independent    Vocation Full time employment    Vocation Requirements Full time ; stand      Cognition   Overall Cognitive Status Within Functional Limits for tasks assessed      Observation/Other Assessments   Focus on Therapeutic Outcomes (FOTO)  52; 48% affected      Functional Tests   Functional  tests Single leg stance;Sit to Stand      Single Leg Stance   Comments Rt : 60" ; LT : 50"      Sit to Stand   Comments 11 in 30 second period of time.      Posture/Postural Control   Posture Comments Pt sits on Rt hip at all times      ROM / Strength   AROM / PROM / Strength Strength      Strength   Strength Assessment Site Knee;Hip  Right/Left Hip Right;Left    Right Hip Flexion 5/5    Right Hip Extension 5/5    Right Hip ABduction 5/5    Left Hip Flexion 4/5    Left Hip Extension 5/5    Left Hip ABduction 4/5    Right/Left Knee Right;Left    Right Knee Flexion 4/5    Right Knee Extension 5/5    Left Knee Flexion 4/5    Left Knee Extension 5/5      Flexibility   Soft Tissue Assessment /Muscle Length yes    Obturator Internus --   knee to chest limited on Lt compared to Rt                       Objective measurements completed on examination: See above findings.               PT Education - 05/24/21 0908     Education Details HEp    Person(s) Educated Patient    Methods Explanation;Verbal cues;Tactile cues;Handout    Comprehension Verbalized understanding;Returned demonstration              PT Short Term Goals - 05/24/21 0915       PT SHORT TERM GOAL #1   Title Pt to be I in HEP to allow pain level to be no greater than a 7/10    Time 3    Period Weeks    Status New    Target Date 06/14/21      PT SHORT TERM GOAL #2   Title Pt to be able to sit for 20 minutes in comfort.    Time 3    Period Weeks    Status New      PT SHORT TERM GOAL #3   Title PT hip strength to be wnl to allow pt to be able to ascend and descend 4 steps in a reciprocal manner without increased pain.    Time 3    Period Weeks    Status New      PT SHORT TERM GOAL #4   Title Pt to be walking no greater than 3x a night    Time 3    Period Weeks    Status New               PT Long Term Goals - 05/24/21 6073       PT LONG TERM GOAL #1    Title Pt to be I in an advanced HEP to allow pt pain to be  not greater than a 3/10 to allow pt to sleep with waking no greater than one time a night secondary to Lt hip pain.    Time 6    Period Weeks    Status New    Target Date 07/05/21      PT LONG TERM GOAL #2   Title Pt to be I in self manual techniques to decrease sx to reduce distress    Time 6    Period Weeks    Status New      PT LONG TERM GOAL #3   Title Pt Lt hip to have improved power demonstrated by being able to complete 16 or greater sit to stands in a 30 second period.    Time 6    Period Weeks    Status New      PT LONG TERM GOAL #4   Title PT to be able to sit for 40 minutes  in comfort to allow traveling and eating out .    Time 6    Period Weeks    Status New      PT LONG TERM GOAL #5   Title Pt to be able to walk for 30 mintues to allow pt to be able to shop in comfort.    Time 6    Period Weeks    Status New                    Plan - 05/24/21 0909     Clinical Impression Statement Carla Wise is a 55 yo female who has been having progressive Lt hip pain since January.  She has had injections without significant relief.  She states that the hip stays swollen and the pain is so bad she can not sleep at night.  She has not stopped her regular activities she just powers thru them.  She has been referred to skilled PT .  Evaluation demonstrates increased pain with palpation, decreased flexibility, decreased strength and decreased functional mobility.  Carla Wise will benefit from skilled PT to address these issues and decrease her pain.    Personal Factors and Comorbidities Behavior Pattern    Examination-Activity Limitations Locomotion Level;Stand;Sit;Sleep    Examination-Participation Restrictions Church;Cleaning;Community Activity;Driving;Occupation;Meal Prep;Yard Work;Shop    Stability/Clinical Decision Making Evolving/Moderate complexity    Clinical Decision Making Moderate    Rehab Potential  Good    PT Frequency 2x / week    PT Duration 6 weeks    PT Treatment/Interventions ADLs/Self Care Home Management;Patient/family education;Manual techniques;Dry needling;Passive range of motion;Gait training;Functional mobility training;Therapeutic activities;Therapeutic exercise    PT Next Visit Plan Assess if pt is a candidate for dry needling if pt is with Lysbeth Galas or Sharyn Lull.  2 minute walk test, begin manual , hip flexor stretch, hip abduction and flexor strengthening    PT Home Exercise Plan knee to chest, standing IT band stretches.    Consulted and Agree with Plan of Care Patient             Patient will benefit from skilled therapeutic intervention in order to improve the following deficits and impairments:  Decreased activity tolerance, Decreased strength, Increased edema, Pain, Increased fascial restricitons, Difficulty walking  Visit Diagnosis: Pain in left hip  Stiffness of left hip, not elsewhere classified     Problem List Patient Active Problem List   Diagnosis Date Noted   Otalgia of both ears 04/24/2021   Non-recurrent acute suppurative otitis media of right ear without spontaneous rupture of tympanic membrane 04/24/2021   Bronchitis with wheezing 04/24/2021   Rectal pain 02/22/2021   External hemorrhoid 01/23/2021   Barrett's esophagus 10/10/2020   Flatulence 10/10/2020   Papanicolaou smear of cervix with positive 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 virus (HPV) test 09/23/2020   Insomnia 09/16/2020   Hyperlipidemia 09/16/2020   Rib pain on right side 09/12/2020   Fatigue 08/23/2020   Belching    History of gastric polyp 01/28/2019   Gastroesophageal reflux disease 12/30/2018   Anxiety 06/18/2018   Adhesive capsulitis of left shoulder    Dyspepsia and disorder of function of stomach    Constipation 02/15/2017   Dysphagia 02/15/2017   Colon cancer screening 02/15/2017   Arthritis of knee, degenerative 03/09/2014   Knee pain 08/21/2011   Stiffness of joint,  not elsewhere classified, lower leg 08/21/2011   Medial meniscus, posterior horn derangement 08/02/2011   Torn meniscus 07/19/2011   HIP PAIN 02/16/2009   Caren Griffins  Joneen Caraway, [place] [phone]  05/24/2021, 9:23 AM  [facility] [address] [place], [place], [address] Phone: [phone]   Fax:  [phone]  Name: JULLIANNA GABOR MRN: [id number] Date of Birth: 1966-12-10

## 2021-05-29 ENCOUNTER — Ambulatory Visit (HOSPITAL_COMMUNITY): Payer: BC Managed Care – PPO | Admitting: Physical Therapy

## 2021-05-30 ENCOUNTER — Ambulatory Visit (HOSPITAL_COMMUNITY): Payer: BC Managed Care – PPO | Admitting: Physical Therapy

## 2021-05-30 ENCOUNTER — Encounter (HOSPITAL_COMMUNITY): Payer: Self-pay | Admitting: Physical Therapy

## 2021-05-30 ENCOUNTER — Other Ambulatory Visit: Payer: Self-pay

## 2021-05-30 DIAGNOSIS — M25652 Stiffness of left hip, not elsewhere classified: Secondary | ICD-10-CM

## 2021-05-30 DIAGNOSIS — M25552 Pain in left hip: Secondary | ICD-10-CM | POA: Diagnosis not present

## 2021-05-30 NOTE — Therapy (Signed)
[place], [place], [address] Phone: [phone]   Fax:  [phone]  Physical Therapy Treatment  Patient Details  Name: Carla Wise MRN: [id number] Date of Birth: May 26, 1966 Referring Provider (PT): [name]   Encounter Date: 05/30/2021   PT End of Session - 05/30/21 1522     Visit Number 2    Number of Visits 12    Date for PT Re-Evaluation 07/05/21    Authorization Type BCBS    Progress Note Due on Visit 30    PT Start Time 1512    PT Stop Time 1553    PT Time Calculation (min) 41 min    Activity Tolerance Patient tolerated treatment well    Behavior During Therapy [facility] for tasks assessed/performed             Past Medical History:  Diagnosis Date   Barrett's esophagus    Constipation    Gastric adenoma 02/2017   GERD (gastroesophageal reflux disease)    PONV (postoperative nausea and vomiting)     Past Surgical History:  Procedure Laterality Date   86 HOUR [place] STUDY N/A 07/28/2020   Procedure: 24 HOUR [place] STUDY;  Surgeon: Lavena Bullion, DO;   No evidence of pathologic gastroesophageal reflux.  Reflux symptoms slightly elevated, predominantly weekly.  Increase weekly acid reflux symptoms could be secondary to supragastric belching. Recommended trial of baclofen, not helpful   BALLOON DILATION N/A 03/17/2021   Procedure: BALLOON DILATION;  Surgeon: [name], DO;  [facility];  Service: Endoscopy;  Laterality: N/A;   BIOPSY  01/30/2019   Procedure: BIOPSY;  Surgeon: Danie Binder, MD;  [facility];  Service: Endoscopy;;   BIOPSY  03/17/2021   Procedure: BIOPSY;  Surgeon: [name], DO;  [facility];  Service: Endoscopy;;  gastric GEJ   COLONOSCOPY WITH PROPOFOL N/A 03/17/2021   Procedure: COLONOSCOPY WITH PROPOFOL;  Surgeon: [name], DO;  [facility];  Service: Endoscopy;  Laterality: N/A;  PM   ESOPHAGEAL  MANOMETRY N/A 07/27/2020   Procedure: ESOPHAGEAL MANOMETRY (EM);  Surgeon: Lavena Bullion, DO;  Normal   ESOPHAGOGASTRODUODENOSCOPY N/A 03/01/2017   Dr. Oneida Alar: benign-appearing peptic stricture s/p dilation, gastric polyp (tubular adenoma), gastritis, duodenal diverticulm   ESOPHAGOGASTRODUODENOSCOPY N/A 01/30/2019   Procedure: ESOPHAGOGASTRODUODENOSCOPY (EGD);  Surgeon: Danie Binder, MD;  Benign-appearing esophageal stenosis, single 4 mm sessile polyp (hyperplastic), mild gastritis, normal examined duodenum (pathology- Peptic duodenitis)   ESOPHAGOGASTRODUODENOSCOPY (EGD) WITH PROPOFOL N/A 03/17/2021   Procedure: ESOPHAGOGASTRODUODENOSCOPY (EGD) WITH PROPOFOL;  Surgeon: [name], DO;  [facility];  Service: Endoscopy;  Laterality: N/A;   EXAM UNDER ANESTHESIA WITH MANIPULATION OF SHOULDER Left 06/06/2017   Procedure: EXAM UNDER ANESTHESIA WITH MANIPULATION OF SHOULDER;  Surgeon: Carole Civil, MD;  [facility]: [facility];  Service: Orthopedics;  Laterality: Left;  full relaxation   FOOT SURGERY Left 2003   hammer toe- great toe   KNEE ARTHROSCOPY W/ MENISCECTOMY     left knee   POLYPECTOMY  03/17/2021   Procedure: POLYPECTOMY;  Surgeon: [name], DO;  [facility];  Service: Endoscopy;;   SAVORY DILATION N/A 03/01/2017   Procedure: SAVORY DILATION;  Surgeon: Danie Binder, MD;  [facility];  Service: Endoscopy;  Laterality: N/A;   UPPER GASTROINTESTINAL ENDOSCOPY  05/20/2020   Dr. Bryan Lemma: Benign stricture dilated 20 mm TTS balloon then fractured  with forceps, 1 cm sliding-type HH, esophageal ulcer x2, Hill grade 3 valve, mild gastritis, no H. pylori.  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 Needs repeat EGD in 1 year.     There were no vitals filed for this visit.   Subjective Assessment - 05/30/21 1521     Subjective Patient says pain is 9/10 today. She has been doing HEP. Would like to get an MRI.    Pertinent  History Lt SALK    Limitations Sitting;Standing;Walking;House hold activities;Lifting    How long can you sit comfortably? 10 minutes    How long can you stand comfortably? 30 minutes    How long can you walk comfortably? PT will start feeling pain in avout 15 minuere    Patient Stated Goals Pt has trouble getting to sleep and wakes up at least 6 times a night.    Currently in Pain? Yes    Pain Score 9     Pain Location Hip    Pain Orientation Left    Pain Descriptors / Indicators Aching;Throbbing;Sharp    Pain Type Chronic pain    Pain Onset More than a month ago    Pain Frequency Constant                               [facility] Adult PT Treatment/Exercise - 05/30/21 0001       Exercises   Exercises Knee/Hip      Knee/Hip Exercises: Stretches   Piriformis Stretch Both;30 seconds;3 reps    Other Knee/Hip Stretches SKTC LT 5 x 10"      Knee/Hip Exercises: Supine   Bridges 2 sets;10 reps    Straight Leg Raises Left;2 sets;10 reps      Knee/Hip Exercises: Sidelying   Hip ABduction Left;10 reps;1 set    Clams 2 x 10 LT      Manual Therapy   Manual Therapy Soft tissue mobilization    Manual therapy comments completed separate from all other activity    Soft tissue mobilization STM using tennis ball to LT lateral hip and ITB with patient in sidelying                      PT Short Term Goals - 05/24/21 0915       PT SHORT TERM GOAL #1   Title Pt to be I in HEP to allow pain level to be no greater than a 7/10    Time 3    Period Weeks    Status New    Target Date 06/14/21      PT SHORT TERM GOAL #2   Title Pt to be able to sit for 20 minutes in comfort.    Time 3    Period Weeks    Status New      PT SHORT TERM GOAL #3   Title PT hip strength to be wnl to allow pt to be able to ascend and descend 4 steps in a reciprocal manner without increased pain.    Time 3    Period Weeks    Status New      PT SHORT TERM GOAL #4   Title Pt to be  walking no greater than 3x a night    Time 3    Period Weeks    Status New               PT Long Term  Goals - 05/24/21 0918       PT LONG TERM GOAL #1   Title Pt to be I in an advanced HEP to allow pt pain to be  not greater than a 3/10 to allow pt to sleep with waking no greater than one time a night secondary to Lt hip pain.    Time 6    Period Weeks    Status New    Target Date 07/05/21      PT LONG TERM GOAL #2   Title Pt to be I in self manual techniques to decrease sx to reduce distress    Time 6    Period Weeks    Status New      PT LONG TERM GOAL #3   Title Pt Lt hip to have improved power demonstrated by being able to complete 16 or greater sit to stands in a 30 second period.    Time 6    Period Weeks    Status New      PT LONG TERM GOAL #4   Title PT to be able to sit for 40 minutes in comfort to allow traveling and eating out .    Time 6    Period Weeks    Status New      PT LONG TERM GOAL #5   Title Pt to be able to walk for 30 mintues to allow pt to be able to shop in comfort.    Time 6    Period Weeks    Status New                   Plan - 05/30/21 1623     Clinical Impression Statement Patient tolerated session fairly well today. Initiated ther ex. Progressed hip strengthening to patient tolerance. Added STM to LT hip musculature to address pain and restriction. Patient educated on proper form and mechanics with all added exercises. Issued updated HEP handout and educated on self STM using tennis ball.    Personal Factors and Comorbidities Behavior Pattern    Examination-Activity Limitations Locomotion Level;Stand;Sit;Sleep    Examination-Participation Restrictions Church;Cleaning;Community Activity;Driving;Occupation;Meal Prep;Yard Work;Shop    Stability/Clinical Decision Making Evolving/Moderate complexity    Rehab Potential Good    PT Frequency 2x / week    PT Duration 6 weeks    PT Treatment/Interventions ADLs/Self Care Home  Management;Patient/family education;Manual techniques;Dry needling;Passive range of motion;Gait training;Functional mobility training;Therapeutic activities;Therapeutic exercise    PT Next Visit Plan Assess response to manual and HEP. Add hip flexor stretch, hip abduction and flexor strengthening    PT Home Exercise Plan knee to chest, standing IT band stretches. 6/21: sidelying hip abduction, bridge, self STM    Consulted and Agree with Plan of Care Patient             Patient will benefit from skilled therapeutic intervention in order to improve the following deficits and impairments:  Decreased activity tolerance, Decreased strength, Increased edema, Pain, Increased fascial restricitons, Difficulty walking  Visit Diagnosis: Pain in left hip  Stiffness of left hip, not elsewhere classified     Problem List Patient Active Problem List   Diagnosis Date Noted   Otalgia of both ears 04/24/2021   Non-recurrent acute suppurative otitis media of right ear without spontaneous rupture of tympanic membrane 04/24/2021   Bronchitis with wheezing 04/24/2021   Rectal pain 02/22/2021   External hemorrhoid 01/23/2021   Barrett's esophagus 10/10/2020   Flatulence 10/10/2020   Papanicolaou smear of cervix with  positive 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 virus (HPV) test 09/23/2020   Insomnia 09/16/2020   Hyperlipidemia 09/16/2020   Rib pain on right side 09/12/2020   Fatigue 08/23/2020   Belching    History of gastric polyp 01/28/2019   Gastroesophageal reflux disease 12/30/2018   Anxiety 06/18/2018   Adhesive capsulitis of left shoulder    Dyspepsia and disorder of function of stomach    Constipation 02/15/2017   Dysphagia 02/15/2017   Colon cancer screening 02/15/2017   Arthritis of knee, degenerative 03/09/2014   Knee pain 08/21/2011   Stiffness of joint, not elsewhere classified, lower leg 08/21/2011   Medial meniscus, posterior horn derangement 08/02/2011   Torn meniscus 07/19/2011    HIP PAIN 02/16/2009   4:25 PM, 05/30/21 Josue Hector PT DPT  Physical Therapist with [facility]  [phone] [place], [place], [address] Phone: [phone]   Fax:  [phone]  Name: Carla Wise MRN: [id number] Date of Birth: 29-Mar-1966

## 2021-05-30 NOTE — Patient Instructions (Signed)
Access Code: [email or web address] URL: [email or web address] Date: 05/30/2021 Prepared by: Josue Hector  Exercises Supine Bridge - 2-3 x daily - 7 x weekly - 2 sets - 10 reps Clamshell - 2-3 x daily - 7 x weekly - 2 sets - 10 reps

## 2021-05-31 ENCOUNTER — Ambulatory Visit (HOSPITAL_COMMUNITY): Payer: BC Managed Care – PPO | Admitting: Physical Therapy

## 2021-05-31 ENCOUNTER — Encounter (HOSPITAL_COMMUNITY): Payer: Self-pay | Admitting: Physical Therapy

## 2021-05-31 DIAGNOSIS — M25552 Pain in left hip: Secondary | ICD-10-CM

## 2021-05-31 DIAGNOSIS — M25652 Stiffness of left hip, not elsewhere classified: Secondary | ICD-10-CM | POA: Diagnosis not present

## 2021-05-31 NOTE — Patient Instructions (Signed)
Access Code: [email or web address] URL: [email or web address] Date: 05/31/2021 Prepared by: Josue Hector  Exercises Clamshell - 2 x daily - 7 x weekly - 2 sets - 10 reps Hip Flexor Stretch at Edge of Bed - 2 x daily - 7 x weekly - 1 sets - 4 reps - 30 seconds hold

## 2021-05-31 NOTE — Therapy (Signed)
[place], [place], [address] Phone: [phone]   Fax:  [phone]  Physical Therapy Treatment  Patient Details  Name: Carla Wise MRN: [id number] Date of Birth: 1966-03-11 Referring Provider (PT): [name]   Encounter Date: 05/31/2021   PT End of Session - 05/31/21 1611     Visit Number 3    Number of Visits 12    Date for PT Re-Evaluation 07/05/21    Authorization Type BCBS    Progress Note Due on Visit 30    PT Start Time 1605    PT Stop Time 1648    PT Time Calculation (min) 43 min    Activity Tolerance Patient tolerated treatment well    Behavior During Therapy [facility] for tasks assessed/performed             Past Medical History:  Diagnosis Date   Barrett's esophagus    Constipation    Gastric adenoma 02/2017   GERD (gastroesophageal reflux disease)    PONV (postoperative nausea and vomiting)     Past Surgical History:  Procedure Laterality Date   21 HOUR [place] STUDY N/A 07/28/2020   Procedure: 24 HOUR [place] STUDY;  Surgeon: Lavena Bullion, DO;   No evidence of pathologic gastroesophageal reflux.  Reflux symptoms slightly elevated, predominantly weekly.  Increase weekly acid reflux symptoms could be secondary to supragastric belching. Recommended trial of baclofen, not helpful   BALLOON DILATION N/A 03/17/2021   Procedure: BALLOON DILATION;  Surgeon: [name], DO;  [facility];  Service: Endoscopy;  Laterality: N/A;   BIOPSY  01/30/2019   Procedure: BIOPSY;  Surgeon: Danie Binder, MD;  [facility];  Service: Endoscopy;;   BIOPSY  03/17/2021   Procedure: BIOPSY;  Surgeon: [name], DO;  [facility];  Service: Endoscopy;;  gastric GEJ   COLONOSCOPY WITH PROPOFOL N/A 03/17/2021   Procedure: COLONOSCOPY WITH PROPOFOL;  Surgeon: [name], DO;  [facility];  Service: Endoscopy;  Laterality: N/A;  PM   ESOPHAGEAL  MANOMETRY N/A 07/27/2020   Procedure: ESOPHAGEAL MANOMETRY (EM);  Surgeon: Lavena Bullion, DO;  Normal   ESOPHAGOGASTRODUODENOSCOPY N/A 03/01/2017   Dr. Oneida Alar: benign-appearing peptic stricture s/p dilation, gastric polyp (tubular adenoma), gastritis, duodenal diverticulm   ESOPHAGOGASTRODUODENOSCOPY N/A 01/30/2019   Procedure: ESOPHAGOGASTRODUODENOSCOPY (EGD);  Surgeon: Danie Binder, MD;  Benign-appearing esophageal stenosis, single 4 mm sessile polyp (hyperplastic), mild gastritis, normal examined duodenum (pathology- Peptic duodenitis)   ESOPHAGOGASTRODUODENOSCOPY (EGD) WITH PROPOFOL N/A 03/17/2021   Procedure: ESOPHAGOGASTRODUODENOSCOPY (EGD) WITH PROPOFOL;  Surgeon: [name], DO;  [facility];  Service: Endoscopy;  Laterality: N/A;   EXAM UNDER ANESTHESIA WITH MANIPULATION OF SHOULDER Left 06/06/2017   Procedure: EXAM UNDER ANESTHESIA WITH MANIPULATION OF SHOULDER;  Surgeon: Carole Civil, MD;  Location: [facility];  Service: Orthopedics;  Laterality: Left;  full relaxation   FOOT SURGERY Left 2003   hammer toe- great toe   KNEE ARTHROSCOPY W/ MENISCECTOMY     left knee   POLYPECTOMY  03/17/2021   Procedure: POLYPECTOMY;  Surgeon: [name], DO;  [facility];  Service: Endoscopy;;   SAVORY DILATION N/A 03/01/2017   Procedure: SAVORY DILATION;  Surgeon: Danie Binder, MD;  [facility];  Service: Endoscopy;  Laterality: N/A;   UPPER GASTROINTESTINAL ENDOSCOPY  05/20/2020   Dr. Bryan Lemma: Benign stricture dilated 20 mm TTS balloon then fractured  with forceps, 1 cm sliding-type HH, esophageal ulcer x2, Hill grade 3 valve, mild gastritis, no H. pylori.  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 Needs repeat EGD in 1 year.     There were no vitals filed for this visit.   Subjective Assessment - 05/31/21 1610     Subjective "I've had a hard day today, dont know why"    Pertinent History Lt SALK    Limitations  Sitting;Standing;Walking;House hold activities;Lifting    How long can you sit comfortably? 10 minutes    How long can you stand comfortably? 30 minutes    How long can you walk comfortably? PT will start feeling pain in avout 15 minuere    Patient Stated Goals Pt has trouble getting to sleep and wakes up at least 6 times a night.    Currently in Pain? Yes    Pain Score 9     Pain Location Hip    Pain Orientation Left    Pain Descriptors / Indicators Aching;Throbbing    Pain Type Chronic pain    Pain Onset More than a month ago    Pain Frequency Constant                               [facility] Adult PT Treatment/Exercise - 05/31/21 0001       Knee/Hip Exercises: Stretches   Piriformis Stretch Left;2 reps;30 seconds    Other Knee/Hip Stretches SKTC LT 10 x 5"    Other Knee/Hip Stretches hip flexor thomas stretch 3 x 30" on LT      Knee/Hip Exercises: Supine   Bridges 2 sets;10 reps    Straight Leg Raises Left;2 sets;10 reps      Knee/Hip Exercises: Sidelying   Clams 2 x 10 LT      Manual Therapy   Manual Therapy Soft tissue mobilization    Manual therapy comments completed separate from all other activity    Soft tissue mobilization IASTM using theragun Lv 10 tp LT hip muscles, lateral quad                      PT Short Term Goals - 05/24/21 0915       PT SHORT TERM GOAL #1   Title Pt to be I in HEP to allow pain level to be no greater than a 7/10    Time 3    Period Weeks    Status New    Target Date 06/14/21      PT SHORT TERM GOAL #2   Title Pt to be able to sit for 20 minutes in comfort.    Time 3    Period Weeks    Status New      PT SHORT TERM GOAL #3   Title PT hip strength to be wnl to allow pt to be able to ascend and descend 4 steps in a reciprocal manner without increased pain.    Time 3    Period Weeks    Status New      PT SHORT TERM GOAL #4   Title Pt to be walking no greater than 3x a night    Time 3    Period  Weeks    Status New               PT Long Term Goals - 05/24/21 0109       PT LONG TERM GOAL #  1   Title Pt to be I in an advanced HEP to allow pt pain to be  not greater than a 3/10 to allow pt to sleep with waking no greater than one time a night secondary to Lt hip pain.    Time 6    Period Weeks    Status New    Target Date 07/05/21      PT LONG TERM GOAL #2   Title Pt to be I in self manual techniques to decrease sx to reduce distress    Time 6    Period Weeks    Status New      PT LONG TERM GOAL #3   Title Pt Lt hip to have improved power demonstrated by being able to complete 16 or greater sit to stands in a 30 second period.    Time 6    Period Weeks    Status New      PT LONG TERM GOAL #4   Title PT to be able to sit for 40 minutes in comfort to allow traveling and eating out .    Time 6    Period Weeks    Status New      PT LONG TERM GOAL #5   Title Pt to be able to walk for 30 mintues to allow pt to be able to shop in comfort.    Time 6    Period Weeks    Status New                   Plan - 05/31/21 1653     Clinical Impression Statement Patient shows good tolerance despite subjective pain rating. Patient shows improved return with clamshell and leg raises today. Patient required verbal cues for proper form and mechanics with Thomas stretch for hip flexors. Trialed IASTM using theragun. Patient reports pain reduction from 9.5 to 9/10. Issued updated HEP handout.    Personal Factors and Comorbidities Behavior Pattern    Examination-Activity Limitations Locomotion Level;Stand;Sit;Sleep    Examination-Participation Restrictions Church;Cleaning;Community Activity;Driving;Occupation;Meal Prep;Yard Work;Shop    Stability/Clinical Decision Making Evolving/Moderate complexity    Rehab Potential Good    PT Frequency 2x / week    PT Duration 6 weeks    PT Treatment/Interventions ADLs/Self Care Home Management;Patient/family education;Manual  techniques;Dry needling;Passive range of motion;Gait training;Functional mobility training;Therapeutic activities;Therapeutic exercise    PT Next Visit Plan Assess response to manual and HEP. Add hip flexor stretch, hip abduction and flexor strengthening    PT Home Exercise Plan knee to chest, standing IT band stretches. 6/21: sidelying hip abduction, bridge, self STM 6/22 clam, thomas stretch    Consulted and Agree with Plan of Care Patient             Patient will benefit from skilled therapeutic intervention in order to improve the following deficits and impairments:  Decreased activity tolerance, Decreased strength, Increased edema, Pain, Increased fascial restricitons, Difficulty walking  Visit Diagnosis: Pain in left hip  Stiffness of left hip, not elsewhere classified     Problem List Patient Active Problem List   Diagnosis Date Noted   Otalgia of both ears 04/24/2021   Non-recurrent acute suppurative otitis media of right ear without spontaneous rupture of tympanic membrane 04/24/2021   Bronchitis with wheezing 04/24/2021   Rectal pain 02/22/2021   External hemorrhoid 01/23/2021   Barrett's esophagus 10/10/2020   Flatulence 10/10/2020   Papanicolaou smear of cervix with positive 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 virus (HPV) test 09/23/2020  Insomnia 09/16/2020   Hyperlipidemia 09/16/2020   Rib pain on right side 09/12/2020   Fatigue 08/23/2020   Belching    History of gastric polyp 01/28/2019   Gastroesophageal reflux disease 12/30/2018   Anxiety 06/18/2018   Adhesive capsulitis of left shoulder    Dyspepsia and disorder of function of stomach    Constipation 02/15/2017   Dysphagia 02/15/2017   Colon cancer screening 02/15/2017   Arthritis of knee, degenerative 03/09/2014   Knee pain 08/21/2011   Stiffness of joint, not elsewhere classified, lower leg 08/21/2011   Medial meniscus, posterior horn derangement 08/02/2011   Torn meniscus 07/19/2011   HIP PAIN 02/16/2009    5:05 PM, 05/31/21 Josue Hector PT DPT  Physical Therapist with [facility]  [phone] [place], [place], [address] Phone: [phone]   Fax:  [phone]  Name: Carla Wise MRN: [id number] Date of Birth: 17-May-1966

## 2021-06-06 ENCOUNTER — Other Ambulatory Visit: Payer: Self-pay

## 2021-06-06 ENCOUNTER — Ambulatory Visit (HOSPITAL_COMMUNITY): Payer: BC Managed Care – PPO | Admitting: Physical Therapy

## 2021-06-06 ENCOUNTER — Encounter (HOSPITAL_COMMUNITY): Payer: Self-pay | Admitting: Physical Therapy

## 2021-06-06 DIAGNOSIS — M25652 Stiffness of left hip, not elsewhere classified: Secondary | ICD-10-CM | POA: Diagnosis not present

## 2021-06-06 DIAGNOSIS — M25552 Pain in left hip: Secondary | ICD-10-CM

## 2021-06-06 NOTE — Therapy (Signed)
[place], [place], [address] Phone: [phone]   Fax:  [phone]  Physical Therapy Treatment  Patient Details  Name: Carla Wise MRN: [id number] Date of Birth: 07/05/1966 Referring Provider (PT): [name]   Encounter Date: 06/06/2021   PT End of Session - 06/06/21 1525     Visit Number 4    Number of Visits 12    Date for PT Re-Evaluation 07/05/21    Authorization Type BCBS    Progress Note Due on Visit 30    PT Start Time 1520    PT Stop Time 1605    PT Time Calculation (min) 45 min    Activity Tolerance Patient tolerated treatment well    Behavior During Therapy [facility] for tasks assessed/performed             Past Medical History:  Diagnosis Date   Barrett's esophagus    Constipation    Gastric adenoma 02/2017   GERD (gastroesophageal reflux disease)    PONV (postoperative nausea and vomiting)     Past Surgical History:  Procedure Laterality Date   44 HOUR [place] STUDY N/A 07/28/2020   Procedure: 24 HOUR [place] STUDY;  Surgeon: Lavena Bullion, DO;   No evidence of pathologic gastroesophageal reflux.  Reflux symptoms slightly elevated, predominantly weekly.  Increase weekly acid reflux symptoms could be secondary to supragastric belching. Recommended trial of baclofen, not helpful   BALLOON DILATION N/A 03/17/2021   Procedure: BALLOON DILATION;  Surgeon: [name], DO;  [facility];  Service: Endoscopy;  Laterality: N/A;   BIOPSY  01/30/2019   Procedure: BIOPSY;  Surgeon: Danie Binder, MD;  [facility];  Service: Endoscopy;;   BIOPSY  03/17/2021   Procedure: BIOPSY;  Surgeon: [name], DO;  [facility];  Service: Endoscopy;;  gastric GEJ   COLONOSCOPY WITH PROPOFOL N/A 03/17/2021   Procedure: COLONOSCOPY WITH PROPOFOL;  Surgeon: [name], DO;  [facility];  Service: Endoscopy;  Laterality: N/A;  PM   ESOPHAGEAL  MANOMETRY N/A 07/27/2020   Procedure: ESOPHAGEAL MANOMETRY (EM);  Surgeon: Lavena Bullion, DO;  Normal   ESOPHAGOGASTRODUODENOSCOPY N/A 03/01/2017   Dr. Oneida Alar: benign-appearing peptic stricture s/p dilation, gastric polyp (tubular adenoma), gastritis, duodenal diverticulm   ESOPHAGOGASTRODUODENOSCOPY N/A 01/30/2019   Procedure: ESOPHAGOGASTRODUODENOSCOPY (EGD);  Surgeon: Danie Binder, MD;  Benign-appearing esophageal stenosis, single 4 mm sessile polyp (hyperplastic), mild gastritis, normal examined duodenum (pathology- Peptic duodenitis)   ESOPHAGOGASTRODUODENOSCOPY (EGD) WITH PROPOFOL N/A 03/17/2021   Procedure: ESOPHAGOGASTRODUODENOSCOPY (EGD) WITH PROPOFOL;  Surgeon: [name], DO;  [facility];  Service: Endoscopy;  Laterality: N/A;   EXAM UNDER ANESTHESIA WITH MANIPULATION OF SHOULDER Left 06/06/2017   Procedure: EXAM UNDER ANESTHESIA WITH MANIPULATION OF SHOULDER;  Surgeon: Carole Civil, MD;  [facility]: [facility];  Service: Orthopedics;  Laterality: Left;  full relaxation   FOOT SURGERY Left 2003   hammer toe- great toe   KNEE ARTHROSCOPY W/ MENISCECTOMY     left knee   POLYPECTOMY  03/17/2021   Procedure: POLYPECTOMY;  Surgeon: [name], DO;  [facility];  Service: Endoscopy;;   SAVORY DILATION N/A 03/01/2017   Procedure: SAVORY DILATION;  Surgeon: Danie Binder, MD;  [facility];  Service: Endoscopy;  Laterality: N/A;   UPPER GASTROINTESTINAL ENDOSCOPY  05/20/2020   Dr. Bryan Lemma: Benign stricture dilated 20 mm TTS balloon then fractured  with forceps, 1 cm sliding-type HH, esophageal ulcer x2, Hill grade 3 valve, mild gastritis, no H. pylori.  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 Needs repeat EGD in 1 year.     There were no vitals filed for this visit.   Subjective Assessment - 06/06/21 1524     Subjective "my weekend was rough, dont know why"    Pertinent History Lt SALK    Limitations  Sitting;Standing;Walking;House hold activities;Lifting    How long can you sit comfortably? 10 minutes    How long can you stand comfortably? 30 minutes    How long can you walk comfortably? PT will start feeling pain in avout 15 minuere    Patient Stated Goals Pt has trouble getting to sleep and wakes up at least 6 times a night.    Currently in Pain? Yes    Pain Score 9     Pain Location Hip    Pain Orientation Left    Pain Descriptors / Indicators Aching;Throbbing    Pain Type Chronic pain    Pain Onset More than a month ago    Pain Frequency Constant                               [facility] Adult PT Treatment/Exercise - 06/06/21 0001       Knee/Hip Exercises: Stretches   Other Knee/Hip Stretches SKTC LT 10 x 5"    Other Knee/Hip Stretches prone on elbows 3 minute      Knee/Hip Exercises: Seated   Sit to Sand 10 reps;without UE support      Knee/Hip Exercises: Supine   Bridges 2 sets;10 reps    Straight Leg Raises Left;2 sets;10 reps      Knee/Hip Exercises: Sidelying   Clams 2 x 10 LT      Manual Therapy   Manual Therapy Joint mobilization    Manual therapy comments completed separate from all other activity    Soft tissue mobilization LAD to LT hip with grade II oscillations                      PT Short Term Goals - 05/24/21 0915       PT SHORT TERM GOAL #1   Title Pt to be I in HEP to allow pain level to be no greater than a 7/10    Time 3    Period Weeks    Status New    Target Date 06/14/21      PT SHORT TERM GOAL #2   Title Pt to be able to sit for 20 minutes in comfort.    Time 3    Period Weeks    Status New      PT SHORT TERM GOAL #3   Title PT hip strength to be wnl to allow pt to be able to ascend and descend 4 steps in a reciprocal manner without increased pain.    Time 3    Period Weeks    Status New      PT SHORT TERM GOAL #4   Title Pt to be walking no greater than 3x a night    Time 3    Period Weeks     Status New               PT Long Term Goals - 05/24/21 6144       PT LONG TERM GOAL #1  Title Pt to be I in an advanced HEP to allow pt pain to be  not greater than a 3/10 to allow pt to sleep with waking no greater than one time a night secondary to Lt hip pain.    Time 6    Period Weeks    Status New    Target Date 07/05/21      PT LONG TERM GOAL #2   Title Pt to be I in self manual techniques to decrease sx to reduce distress    Time 6    Period Weeks    Status New      PT LONG TERM GOAL #3   Title Pt Lt hip to have improved power demonstrated by being able to complete 16 or greater sit to stands in a 30 second period.    Time 6    Period Weeks    Status New      PT LONG TERM GOAL #4   Title PT to be able to sit for 40 minutes in comfort to allow traveling and eating out .    Time 6    Period Weeks    Status New      PT LONG TERM GOAL #5   Title Pt to be able to walk for 30 mintues to allow pt to be able to shop in comfort.    Time 6    Period Weeks    Status New                   Plan - 06/06/21 1634     Clinical Impression Statement Patient continues to be limited by reported pain. Trialed LAD to LT hip joint with oscillations for pain and mobility. Patient reports pain reduction from 9/10 to 8/10 following. Patient also demos improvement in weight acceptance upon standing after competition of manual and mat exercise. Progressed to sit to stands from chair, patient demos good form and tolerance. Will continue to progress activity as tolerated. If no change in subjective complaint, reassess in 1-2 visits and consider return to referring provider.    Personal Factors and Comorbidities Behavior Pattern    Examination-Activity Limitations Locomotion Level;Stand;Sit;Sleep    Examination-Participation Restrictions Church;Cleaning;Community Activity;Driving;Occupation;Meal Prep;Yard Work;Shop    Stability/Clinical Decision Making Evolving/Moderate complexity     Rehab Potential Good    PT Frequency 2x / week    PT Duration 6 weeks    PT Treatment/Interventions ADLs/Self Care Home Management;Patient/family education;Manual techniques;Dry needling;Passive range of motion;Gait training;Functional mobility training;Therapeutic activities;Therapeutic exercise    PT Next Visit Plan Assess response to manual. Continue strengthening in WB    PT Home Exercise Plan knee to chest, standing IT band stretches. 6/21: sidelying hip abduction, bridge, self STM 6/22 clam, thomas stretch    Consulted and Agree with Plan of Care Patient             Patient will benefit from skilled therapeutic intervention in order to improve the following deficits and impairments:  Decreased activity tolerance, Decreased strength, Increased edema, Pain, Increased fascial restricitons, Difficulty walking  Visit Diagnosis: Pain in left hip  Stiffness of left hip, not elsewhere classified     Problem List Patient Active Problem List   Diagnosis Date Noted   Otalgia of both ears 04/24/2021   Non-recurrent acute suppurative otitis media of right ear without spontaneous rupture of tympanic membrane 04/24/2021   Bronchitis with wheezing 04/24/2021   Rectal pain 02/22/2021   External hemorrhoid 01/23/2021   Barrett's esophagus  10/10/2020   Flatulence 10/10/2020   Papanicolaou smear of cervix with positive 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 virus (HPV) test 09/23/2020   Insomnia 09/16/2020   Hyperlipidemia 09/16/2020   Rib pain on right side 09/12/2020   Fatigue 08/23/2020   Belching    History of gastric polyp 01/28/2019   Gastroesophageal reflux disease 12/30/2018   Anxiety 06/18/2018   Adhesive capsulitis of left shoulder    Dyspepsia and disorder of function of stomach    Constipation 02/15/2017   Dysphagia 02/15/2017   Colon cancer screening 02/15/2017   Arthritis of knee, degenerative 03/09/2014   Knee pain 08/21/2011   Stiffness of joint, not elsewhere classified,  lower leg 08/21/2011   Medial meniscus, posterior horn derangement 08/02/2011   Torn meniscus 07/19/2011   HIP PAIN 02/16/2009   4:40 PM, 06/06/21 Josue Hector PT DPT  Physical Therapist with [facility]  [phone], [place], [address] Phone: [phone]   Fax:  [phone]  Name: Carla Wise MRN: [id number] Date of Birth: 1966-09-04

## 2021-06-08 ENCOUNTER — Other Ambulatory Visit: Payer: Self-pay

## 2021-06-08 ENCOUNTER — Encounter (HOSPITAL_COMMUNITY): Payer: Self-pay | Admitting: Physical Therapy

## 2021-06-08 ENCOUNTER — Ambulatory Visit (HOSPITAL_COMMUNITY): Payer: BC Managed Care – PPO | Admitting: Physical Therapy

## 2021-06-08 DIAGNOSIS — M25652 Stiffness of left hip, not elsewhere classified: Secondary | ICD-10-CM | POA: Diagnosis not present

## 2021-06-08 DIAGNOSIS — M25552 Pain in left hip: Secondary | ICD-10-CM

## 2021-06-08 NOTE — Therapy (Signed)
[place] [address] [place], [place], [address] Phone: [phone]   Fax:  [phone]  Physical Therapy Treatment  Patient Details  Name: Carla Wise MRN: [id number] Date of Birth: [birth year] Referring Provider (PT): [name]  PHYSICAL THERAPY DISCHARGE SUMMARY  Visits from Start of Care: 5  Current functional level related to goals / functional outcomes: See below   Remaining deficits: See below   Education / Equipment: See assessment   Patient agrees to discharge. Patient goals were not met. Patient is being discharged due to did not respond to therapy.    Encounter Date: 06/08/2021   PT End of Session - 06/08/21 1628     Visit Number 5    Number of Visits 12    Date for PT Re-Evaluation 07/05/21    Authorization Type BCBS    Progress Note Due on Visit 30    PT Start Time 1605    PT Stop Time 1645    PT Time Calculation (min) 40 min    Activity Tolerance Patient limited by pain    Behavior During Therapy [facility] for tasks assessed/performed             Past Medical History:  Diagnosis Date   Barrett's esophagus    Constipation    Gastric adenoma 02/2017   GERD (gastroesophageal reflux disease)    PONV (postoperative nausea and vomiting)     Past Surgical History:  Procedure Laterality Date   107 HOUR [place] STUDY N/A 07/28/2020   Procedure: 24 HOUR [place] STUDY;  Surgeon: Lavena Bullion, DO;   No evidence of pathologic gastroesophageal reflux.  Reflux symptoms slightly elevated, predominantly weekly.  Increase weekly acid reflux symptoms could be secondary to supragastric belching. Recommended trial of baclofen, not helpful   BALLOON DILATION N/A 03/17/2021   Procedure: BALLOON DILATION;  Surgeon: [name], DO;  [facility];  Service: Endoscopy;  Laterality: N/A;   BIOPSY  01/30/2019   Procedure: BIOPSY;  Surgeon: Danie Binder, MD;  [facility];  Service: Endoscopy;;    BIOPSY  03/17/2021   Procedure: BIOPSY;  Surgeon: [name], DO;  [facility];  Service: Endoscopy;;  gastric GEJ   COLONOSCOPY WITH PROPOFOL N/A 03/17/2021   Procedure: COLONOSCOPY WITH PROPOFOL;  Surgeon: [name], DO;  [facility];  Service: Endoscopy;  Laterality: N/A;  PM   ESOPHAGEAL MANOMETRY N/A 07/27/2020   Procedure: ESOPHAGEAL MANOMETRY (EM);  Surgeon: Lavena Bullion, DO;  Normal   ESOPHAGOGASTRODUODENOSCOPY N/A 03/01/2017   Dr. Oneida Alar: benign-appearing peptic stricture s/p dilation, gastric polyp (tubular adenoma), gastritis, duodenal diverticulm   ESOPHAGOGASTRODUODENOSCOPY N/A 01/30/2019   Procedure: ESOPHAGOGASTRODUODENOSCOPY (EGD);  Surgeon: Danie Binder, MD;  Benign-appearing esophageal stenosis, single 4 mm sessile polyp (hyperplastic), mild gastritis, normal examined duodenum (pathology- Peptic duodenitis)   ESOPHAGOGASTRODUODENOSCOPY (EGD) WITH PROPOFOL N/A 03/17/2021   Procedure: ESOPHAGOGASTRODUODENOSCOPY (EGD) WITH PROPOFOL;  Surgeon: [name], DO;  [facility];  Service: Endoscopy;  Laterality: N/A;   EXAM UNDER ANESTHESIA WITH MANIPULATION OF SHOULDER Left 06/06/2017   Procedure: EXAM UNDER ANESTHESIA WITH MANIPULATION OF SHOULDER;  Surgeon: Carole Civil, MD;  Location: [facility];  Service: Orthopedics;  Laterality: Left;  full relaxation   FOOT SURGERY Left 2003   hammer toe- great toe   KNEE ARTHROSCOPY W/ MENISCECTOMY     left knee   POLYPECTOMY  03/17/2021   Procedure: POLYPECTOMY;  Surgeon:  [name], DO;  [facility];  Service: Endoscopy;;   SAVORY DILATION N/A 03/01/2017   Procedure: SAVORY DILATION;  Surgeon: Danie Binder, MD;  [facility];  Service: Endoscopy;  Laterality: N/A;   UPPER GASTROINTESTINAL ENDOSCOPY  05/20/2020   Dr. Bryan Lemma: Benign stricture dilated 20 mm TTS balloon then fractured with forceps, 1 cm sliding-type HH, esophageal ulcer x2, Hill  grade 3 valve, mild gastritis, no H. pylori.  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 Needs repeat EGD in 1 year.     There were no vitals filed for this visit.   Subjective Assessment - 06/08/21 1625     Subjective "something aint right". Patient reports ongoing and intractable hip pain at 9/10. She reports no lasting improvement from manual intervention last session. She reports daily compliance with HEP. Patient notes no perceivable improvement in pain or function since starting therapy.    Pertinent History Lt SALK    Limitations Sitting;Standing;Walking;House hold activities;Lifting    How long can you sit comfortably? 10 minutes    How long can you stand comfortably? 30 minutes    How long can you walk comfortably? about 20 minutes    Patient Stated Goals Pt has trouble getting to sleep and wakes up at least 6 times a night.    Currently in Pain? Yes    Pain Score 9     Pain Location Hip    Pain Orientation Left    Pain Descriptors / Indicators Aching;Throbbing    Pain Type Chronic pain    Pain Onset More than a month ago    Pain Frequency Constant    Aggravating Factors  WB, standing    Pain Relieving Factors nothing    Effect of Pain on Daily Activities Limits                [facility] PT Assessment - 06/08/21 0001       Assessment   Medical Diagnosis Lt hip pain    Referring Provider (PT) [name]      Precautions   Precautions None      Restrictions   Weight Bearing Restrictions No      Home Ecologist residence      Prior Function   Level of Independence Independent    Vocation Full time employment    Vocation Requirements Full time ; stand      Cognition   Overall Cognitive Status Within Functional Limits for tasks assessed      Single Leg Stance   Comments RT 60", LT 20" mod sway      ROM / Strength   AROM / PROM / Strength AROM      AROM   AROM Assessment Site Hip;Lumbar    Right/Left  Hip Right;Left    Right Hip Extension 15    Right Hip Flexion 110    Left Hip Extension 15    Left Hip Flexion 115    Lumbar Flexion WFL    Lumbar Extension 25% limited      Strength   Right Hip Flexion 5/5    Right Hip Extension 4+/5    Right Hip ABduction 4+/5    Left Hip Flexion 4/5   pain   Left Hip Extension 4-/5   pain   Left Hip ABduction 3+/5   pain   Right Knee Extension 5/5    Left Knee Extension 4/5   pain  PT Short Term Goals - 06/08/21 1634       PT SHORT TERM GOAL #1   Title Pt to be I in HEP to allow pain level to be no greater than a 7/10    Baseline Reports compliance and demos good return, but 9/10 pain reported    Time 3    Period Weeks    Status Not Met    Target Date 06/14/21      PT SHORT TERM GOAL #2   Title Pt to be able to sit for 20 minutes in comfort.    Baseline reports 10 minutes    Time 3    Period Weeks    Status Not Met      PT SHORT TERM GOAL #3   Title PT hip strength to be wnl to allow pt to be able to ascend and descend 4 steps in a reciprocal manner without increased pain.    Baseline Can do but with increased pain    Time 3    Period Weeks    Status Partially Met      PT SHORT TERM GOAL #4   Title Pt to be walking no greater than 3x a night    Baseline "No, im up most of the time"    Time 3    Period Weeks    Status Not Met               PT Long Term Goals - 06/08/21 1635       PT LONG TERM GOAL #1   Title Pt to be I in an advanced HEP to allow pt pain to be  not greater than a 3/10 to allow pt to sleep with waking no greater than one time a night secondary to Lt hip pain.    Baseline Reports compliance and demos good return, but 9/10 pain reported    Time 6    Period Weeks    Status Not Met      PT LONG TERM GOAL #2   Title Pt to be I in self manual techniques to decrease sx to reduce distress    Baseline Reports compliance with STM, was issued  tennis ball for home, no issues noted    Time 6    Period Weeks    Status Achieved      PT LONG TERM GOAL #3   Title Pt Lt hip to have improved power demonstrated by being able to complete 16 or greater sit to stands in a 30 second period.    Baseline Deferred per pain "you gonna have to pick me up off the floor"    Time 6    Period Weeks    Status Deferred      PT LONG TERM GOAL #4   Title PT to be able to sit for 40 minutes in comfort to allow traveling and eating out .    Baseline Reports 10 minutes    Time 6    Period Weeks    Status Not Met      PT LONG TERM GOAL #5   Title Pt to be able to walk for 30 mintues to allow pt to be able to shop in comfort.    Baseline Reports 20 minutes    Time 6    Period Weeks    Status Not Met                   Plan - 06/08/21 1709  Clinical Impression Statement Performed reassessment today per mid point of therapy POC. Patient reports no significant improvement in pain or function since starting therapy, and demonstrates decline in objective function with single leg balance and MMTs. We have addressed sacroiliac instability as contributing factor, as well as lumbar components, without success. Patient reporting adherence to daily HEP, does show good return of exercise and has been very cooperative. Lumbar and hip mobility are good. Patient demos significant LT sided weakness as a result of apprehension due to pain. True muscle strength difficult to discern as a result. Patient did demo positive result with hip scour testing on LLE compared to RT. At this time recommend DC from therapy to return to referring provider for further evaluation due to lack of progress and possible underlying structural anomalies. Patient encouraged to follow up with therapy services with any further questions or concerns.    Personal Factors and Comorbidities Behavior Pattern    Examination-Activity Limitations Locomotion Level;Stand;Sit;Sleep     Examination-Participation Restrictions Church;Cleaning;Community Activity;Driving;Occupation;Meal Prep;Yard Work;Shop    Stability/Clinical Decision Making Evolving/Moderate complexity    Rehab Potential Good    PT Frequency 2x / week    PT Duration 6 weeks    PT Treatment/Interventions ADLs/Self Care Home Management;Patient/family education;Manual techniques;Dry needling;Passive range of motion;Gait training;Functional mobility training;Therapeutic activities;Therapeutic exercise    PT Next Visit Plan DC to return to referring provider    PT Home Exercise Plan knee to chest, standing IT band stretches. 6/21: sidelying hip abduction, bridge, self STM 6/22 clam, thomas stretch    Consulted and Agree with Plan of Care Patient             Patient will benefit from skilled therapeutic intervention in order to improve the following deficits and impairments:  Decreased activity tolerance, Decreased strength, Increased edema, Pain, Increased fascial restricitons, Difficulty walking  Visit Diagnosis: Pain in left hip  Stiffness of left hip, not elsewhere classified     Problem List Patient Active Problem List   Diagnosis Date Noted   Otalgia of both ears 04/24/2021   Non-recurrent acute suppurative otitis media of right ear without spontaneous rupture of tympanic membrane 04/24/2021   Bronchitis with wheezing 04/24/2021   Rectal pain 02/22/2021   External hemorrhoid 01/23/2021   Barrett's esophagus 10/10/2020   Flatulence 10/10/2020   Papanicolaou smear of cervix with positive 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 virus (HPV) test 09/23/2020   Insomnia 09/16/2020   Hyperlipidemia 09/16/2020   Rib pain on right side 09/12/2020   Fatigue 08/23/2020   Belching    History of gastric polyp 01/28/2019   Gastroesophageal reflux disease 12/30/2018   Anxiety 06/18/2018   Adhesive capsulitis of left shoulder    Dyspepsia and disorder of function of stomach    Constipation 02/15/2017   Dysphagia  02/15/2017   Colon cancer screening 02/15/2017   Arthritis of knee, degenerative 03/09/2014   Knee pain 08/21/2011   Stiffness of joint, not elsewhere classified, lower leg 08/21/2011   Medial meniscus, posterior horn derangement 08/02/2011   Torn meniscus 07/19/2011   HIP PAIN 02/16/2009   6:04 PM, 06/08/21 Josue Hector PT DPT  Physical Therapist with [facility]  [phone] [place], [place], [address] Phone: [phone]   Fax:  [phone]  Name: YAZLIN EKBLAD MRN: [id number] Date of Birth: 06/07/1966

## 2021-06-14 ENCOUNTER — Telehealth: Payer: Self-pay | Admitting: Orthopedic Surgery

## 2021-06-14 ENCOUNTER — Encounter (HOSPITAL_COMMUNITY): Payer: BC Managed Care – PPO | Admitting: Physical Therapy

## 2021-06-14 NOTE — Telephone Encounter (Signed)
She will have to be seen in follow up at least 6 weeks from the first visit so we can document conservative care and if it helped  She was ordered MRI hip so 6 weeks from the first visit in regards to her hip

## 2021-06-14 NOTE — Telephone Encounter (Signed)
Patient called to follow up since her physical therapy - states Dr Aline Brochure was to receive a message from therapist at [facility] regarding her being able to complete only 5 sessions of therapy. She is asking what the next step is, in reference to MRI, which was originally denied by insurance.

## 2021-06-14 NOTE — Telephone Encounter (Signed)
Called patient to schedule. Done.

## 2021-06-15 ENCOUNTER — Ambulatory Visit (HOSPITAL_COMMUNITY): Payer: BC Managed Care – PPO

## 2021-06-16 ENCOUNTER — Ambulatory Visit (HOSPITAL_COMMUNITY): Payer: BC Managed Care – PPO | Admitting: Physical Therapy

## 2021-06-20 ENCOUNTER — Encounter (HOSPITAL_COMMUNITY): Payer: BC Managed Care – PPO

## 2021-06-22 ENCOUNTER — Encounter (HOSPITAL_COMMUNITY): Payer: BC Managed Care – PPO

## 2021-06-27 ENCOUNTER — Encounter (HOSPITAL_COMMUNITY): Payer: BC Managed Care – PPO

## 2021-06-29 ENCOUNTER — Encounter (HOSPITAL_COMMUNITY): Payer: BC Managed Care – PPO | Admitting: Physical Therapy

## 2021-07-04 ENCOUNTER — Encounter (HOSPITAL_COMMUNITY): Payer: BC Managed Care – PPO

## 2021-07-06 ENCOUNTER — Encounter (HOSPITAL_COMMUNITY): Payer: BC Managed Care – PPO

## 2021-07-10 ENCOUNTER — Other Ambulatory Visit: Payer: Self-pay

## 2021-07-10 ENCOUNTER — Encounter: Payer: Self-pay | Admitting: Orthopedic Surgery

## 2021-07-10 ENCOUNTER — Ambulatory Visit: Payer: BC Managed Care – PPO | Admitting: Orthopedic Surgery

## 2021-07-10 VITALS — BP 104/77 | HR 95 | Ht 63.0 in | Wt 104.4 lb

## 2021-07-10 DIAGNOSIS — M25551 Pain in right hip: Secondary | ICD-10-CM

## 2021-07-10 DIAGNOSIS — S73191D Other sprain of right hip, subsequent encounter: Secondary | ICD-10-CM

## 2021-07-10 DIAGNOSIS — M25552 Pain in left hip: Secondary | ICD-10-CM

## 2021-07-10 DIAGNOSIS — S73192D Other sprain of left hip, subsequent encounter: Secondary | ICD-10-CM

## 2021-07-10 NOTE — Patient Instructions (Addendum)
While we are working on your approval for MRI please go ahead and call to schedule your appointment with [facility] within at least one (1) week.   Central Scheduling [phone]   Mri both hips w joint contrast  Smoking Tobacco Information, Adult Smoking tobacco can be harmful to your health. Tobacco contains a poisonous (toxic), colorless chemical called nicotine. Nicotine is addictive. It changes the brain and can make it hard to stop smoking. Tobacco also has other toxicchemicals that can hurt your body and raise your risk of many cancers. How can smoking tobacco affect me? Smoking tobacco puts you at risk for: Cancer. Smoking is most commonly associated with lung cancer, but can also lead to cancer in other parts of the body. Chronic obstructive pulmonary disease (COPD). This is a long-term lung condition that makes it hard to breathe. It also gets worse over time. High blood pressure (hypertension), heart disease, stroke, or heart attack. Lung infections, such as pneumonia. Cataracts. This is when the lenses in the eyes become clouded. Digestive problems. This may include peptic ulcers, heartburn, and gastroesophageal reflux disease (GERD). Oral health problems, such as gum disease and tooth loss. Loss of taste and smell. Smoking can affect your appearance by causing: Wrinkles. Yellow or stained teeth, fingers, and fingernails. Smoking tobacco can also affect your social life, because: It may be challenging to find places to smoke when away from home. Many workplaces, [employer], hotels, and public places are tobacco-free. Smoking is expensive. This is due to the cost of tobacco and the long-term costs of treating health problems from smoking. Secondhand smoke may affect those around you. Secondhand smoke can cause lung cancer, breathing problems, and heart disease. Children of smokers have a higher risk for: Sudden infant death syndrome (SIDS). Ear infections. Lung  infections. If you currently smoke tobacco, quitting now can help you: Lead a longer and healthier life. Look, smell, breathe, and feel better over time. Save money. Protect others from the harms of secondhand smoke. What actions can I take to prevent health problems? Quit smoking  Do not start smoking. Quit if you already do. Make a plan to quit smoking and commit to it. Look for programs to help you and ask your health care provider for recommendations and ideas. Set a date and write down all the reasons you want to quit. Let your friends and family know you are quitting so they can help and support you. Consider finding friends who also want to quit. It can be easier to quit with someone else, so that you can support each other. Talk with your health care provider about using nicotine replacement medicines to help you quit, such as gum, lozenges, patches, sprays, or pills. Do not replace cigarette smoking with electronic cigarettes, which are commonly called e-cigarettes. The safety of e-cigarettes is not known, and some may contain harmful chemicals. If you try to quit but return to smoking, stay positive. It is common to slip up when you first quit, so take it one day at a time. Be prepared for cravings. When you feel the urge to smoke, chew gum or suck on hard candy.  Lifestyle Stay busy and take care of your body. Drink enough fluid to keep your urine pale yellow. Get plenty of exercise and eat a healthy diet. This can help prevent weight gain after quitting. Monitor your eating habits. Quitting smoking can cause you to have a larger appetite than when you smoke. Find ways to relax. Go out with  friends or family to a movie or a restaurant where people do not smoke. Ask your health care provider about having regular tests (screenings) to check for cancer. This may include blood tests, imaging tests, and other tests. Find ways to manage your stress, such as meditation, yoga, or  exercise. Where to find support To get support to quit smoking, consider: Asking your health care provider for more information and resources. Taking classes to learn more about quitting smoking. Looking for local organizations that offer resources about quitting smoking. Joining a support group for people who want to quit smoking in your local community. Calling the smokefree.gov counselor helpline: 1-800-Quit-Now 208-586-2193) Where to find more information You may find more information about quitting smoking from: HelpGuide.org: www.helpguide.org https://hall.com/: smokefree.gov American Lung Association: www.lung.org Contact a health care provider if you: Have problems breathing. Notice that your lips, nose, or fingers turn blue. Have chest pain. Are coughing up blood. Feel faint or you pass out. Have other health changes that cause you to worry. Summary Smoking tobacco can negatively affect your health, the health of those around you, your finances, and your social life. Do not start smoking. Quit if you already do. If you need help quitting, ask your health care provider. Think about joining a support group for people who want to quit smoking in your local community. There are many effective programs that will help you to quit this behavior. This information is not intended to replace advice given to you by your health care provider. Make sure you discuss any questions you have with your healthcare provider. Document Revised: 10/18/2020 Document Reviewed: 10/18/2020 Elsevier Patient Education  2022 [employer].

## 2021-07-10 NOTE — Addendum Note (Signed)
Addended byCandice Camp on: 07/10/2021 09:56 AM   Modules accepted: Orders

## 2021-07-10 NOTE — Progress Notes (Signed)
Chief Complaint  Patient presents with   Hip Pain    Bilateral/ both hurting real bad today. My last therapy visit was 06/08/21.   Lanier comes in after physical therapy it did not help again she has bilateral hip pain seems to localize over the trochanters and lateral pelvis  Denies back pain  Examinations consistent with flexion internal rotation pain and tenderness over the left greater trochanter and left pelvis  Encounter Diagnoses  Name Primary?   Tear of left acetabular labrum, subsequent encounter Yes   Tear of right acetabular labrum, subsequent encounter     Recommend MRI both hips for possible labral tears

## 2021-07-25 ENCOUNTER — Ambulatory Visit (HOSPITAL_COMMUNITY)
Admission: RE | Admit: 2021-07-25 | Discharge: 2021-07-25 | Disposition: A | Discharge status: Home / Self Care | Payer: BC Managed Care – PPO | Source: Ambulatory Visit | Attending: Orthopedic Surgery | Admitting: Orthopedic Surgery

## 2021-07-25 ENCOUNTER — Other Ambulatory Visit: Payer: Self-pay | Admitting: Orthopedic Surgery

## 2021-07-25 ENCOUNTER — Encounter (HOSPITAL_COMMUNITY): Payer: Self-pay

## 2021-07-25 ENCOUNTER — Other Ambulatory Visit: Payer: Self-pay

## 2021-07-25 DIAGNOSIS — S73191D Other sprain of right hip, subsequent encounter: Secondary | ICD-10-CM

## 2021-07-25 DIAGNOSIS — S73192D Other sprain of left hip, subsequent encounter: Secondary | ICD-10-CM | POA: Insufficient documentation

## 2021-07-25 DIAGNOSIS — M7062 Trochanteric bursitis, left hip: Secondary | ICD-10-CM | POA: Insufficient documentation

## 2021-07-25 DIAGNOSIS — M25551 Pain in right hip: Secondary | ICD-10-CM | POA: Insufficient documentation

## 2021-07-25 DIAGNOSIS — M7061 Trochanteric bursitis, right hip: Secondary | ICD-10-CM | POA: Diagnosis not present

## 2021-07-25 DIAGNOSIS — M25552 Pain in left hip: Secondary | ICD-10-CM

## 2021-07-25 DIAGNOSIS — M25451 Effusion, right hip: Secondary | ICD-10-CM | POA: Diagnosis not present

## 2021-07-25 MED ORDER — POVIDONE-IODINE 10 % EX SOLN
Freq: Two times a day (BID) | CUTANEOUS | Status: AC | PRN
Start: 2021-07-25 — End: 2021-07-25
  Administered 2021-07-25 (×2): 1 via TOPICAL

## 2021-07-25 MED ORDER — IOHEXOL 180 MG/ML  SOLN
30.0000 mL | Freq: Once | INTRAMUSCULAR | Status: AC
  Administered 2021-07-25: 30 mL

## 2021-07-25 MED ORDER — LIDOCAINE HCL (PF) 1 % IJ SOLN
INTRAMUSCULAR | Status: AC
  Filled 2021-07-25: qty 10

## 2021-07-25 MED ORDER — SODIUM CHLORIDE (PF) 0.9 % IJ SOLN
INTRAMUSCULAR | Status: AC
  Administered 2021-07-25: 10 mL
  Filled 2021-07-25: qty 10

## 2021-07-25 MED ORDER — POVIDONE-IODINE 10 % EX OINT: TOPICAL_OINTMENT | CUTANEOUS | Status: DC | PRN

## 2021-07-25 MED ORDER — POVIDONE-IODINE 10 % EX SOLN
CUTANEOUS | Status: AC
  Filled 2021-07-25: qty 30

## 2021-07-25 MED ORDER — LIDOCAINE HCL (PF) 1 % IJ SOLN
10.0000 mL | Freq: Once | INTRAMUSCULAR | Status: AC
  Administered 2021-07-25: 10 mL

## 2021-07-25 MED ORDER — SODIUM CHLORIDE FLUSH 0.9 % IV SOLN
INTRAVENOUS | Status: AC
  Filled 2021-07-25: qty 10

## 2021-07-25 MED ORDER — GADOBUTROL 1 MMOL/ML IV SOLN
2.0000 mL | Freq: Once | INTRAVENOUS | Status: AC | PRN
  Administered 2021-07-25: 0.1 mL

## 2021-07-25 MED ORDER — SODIUM CHLORIDE (PF) 0.9 % IJ SOLN
INTRAMUSCULAR | Status: AC
  Filled 2021-07-25: qty 20

## 2021-07-25 NOTE — Procedures (Signed)
Preprocedure Dx: Tear of LEFT acetabular labrum subsequent encounter Postprocedure Dx: Tear of LEFT acetabular labrum subsequent encounter Procedure  Fluoroscopically guided LT jhip joint injection for MR arthrogrpahy Radiologist:  Thornton Papas Anesthesia:  3 ml of 1% lidocaine Injectate:  10 ml of [0.5 ml Gadoviast, 5 ml sterile saline, and 15 ml Omnipaque-180 Fluoro time:  0 minutes 48 seconds EBL:   None Complications: None

## 2021-07-25 NOTE — Procedures (Signed)
Preprocedure Dx: Tear of RIGHT acetabular labrum subsequent encounter Postprocedure Dx: Tear of RIGHT acetabular labrum subsequent encounter Procedure  Fluoroscopically guided RT jhip joint injection for MR arthrogrpahy Radiologist:  Thornton Papas Anesthesia:  3 ml of 1% lidocaine Injectate:  10 ml of [0.5 ml Gadoviast, 5 ml sterile saline, and 15 ml Omnipaque-180 Fluoro time:  0 minutes 48 seconds EBL:   None Complications: None

## 2021-07-27 ENCOUNTER — Other Ambulatory Visit: Payer: Self-pay

## 2021-07-27 ENCOUNTER — Ambulatory Visit: Payer: BC Managed Care – PPO | Admitting: Orthopedic Surgery

## 2021-07-27 ENCOUNTER — Encounter: Payer: Self-pay | Admitting: Orthopedic Surgery

## 2021-07-27 VITALS — BP 126/91 | HR 89 | Ht 63.0 in | Wt 105.0 lb

## 2021-07-27 DIAGNOSIS — M25859 Other specified joint disorders, unspecified hip: Secondary | ICD-10-CM

## 2021-07-27 DIAGNOSIS — M25852 Other specified joint disorders, left hip: Secondary | ICD-10-CM

## 2021-07-27 DIAGNOSIS — M85659 Other cyst of bone, unspecified thigh: Secondary | ICD-10-CM

## 2021-07-27 DIAGNOSIS — M25851 Other specified joint disorders, right hip: Secondary | ICD-10-CM

## 2021-07-27 DIAGNOSIS — M85651 Other cyst of bone, right thigh: Secondary | ICD-10-CM | POA: Diagnosis not present

## 2021-07-27 DIAGNOSIS — M85652 Other cyst of bone, left thigh: Secondary | ICD-10-CM

## 2021-07-27 NOTE — Patient Instructions (Addendum)
I will send your referral to [facility] you can call them to schedule the number is [phone] They will need you to get a CD of the xray images from [facility]. The number to call for the CD is [phone] ask for [facility]    Dr Katheren Puller DISEASE OF HIP

## 2021-07-27 NOTE — Progress Notes (Signed)
Chief Complaint  Patient presents with   Hip Pain    Bilateral / here to review MRI scan s   -55 years old she had MRI from left and right hip secondary to chronic pain which did not respond to multiple injections steroids, activity modification  MRI came back fibrocystic disease in the anterolateral femoral necks.  No evidence of labral tearing  I have informed her that we need experience in treating this and I would like her to see the hip specialist Dr. Aretha Parrot at [facility]  She is agreeable to that  I have reviewed the MRI and agree with the report of fibrocystic disease both hips with no history of bursitis or evidence of bursitis on imaging  Patient will follow-up with Dr. Aretha Parrot  (Chronic with exacerbation failure of treatment, reading of MRI images)

## 2021-07-31 DIAGNOSIS — M79674 Pain in right toe(s): Secondary | ICD-10-CM | POA: Diagnosis not present

## 2021-07-31 DIAGNOSIS — B351 Tinea unguium: Secondary | ICD-10-CM | POA: Diagnosis not present

## 2021-08-07 DIAGNOSIS — M25552 Pain in left hip: Secondary | ICD-10-CM | POA: Diagnosis not present

## 2021-08-09 ENCOUNTER — Other Ambulatory Visit: Payer: Self-pay

## 2021-08-09 ENCOUNTER — Ambulatory Visit: Payer: BC Managed Care – PPO | Admitting: Internal Medicine

## 2021-08-09 ENCOUNTER — Encounter: Payer: Self-pay | Admitting: Internal Medicine

## 2021-08-09 DIAGNOSIS — K2100 Gastro-esophageal reflux disease with esophagitis, without bleeding: Secondary | ICD-10-CM

## 2021-08-09 MED ORDER — LUBIPROSTONE 24 MCG PO CAPS: 24.0000 ug | ORAL_CAPSULE | Freq: Two times a day (BID) | ORAL | 11 refills | Status: DC

## 2021-08-09 MED ORDER — ESOMEPRAZOLE MAGNESIUM 40 MG PO CPDR: 40.0000 mg | DELAYED_RELEASE_CAPSULE | Freq: Two times a day (BID) | ORAL | 11 refills | Status: DC

## 2021-08-09 NOTE — Patient Instructions (Signed)
For your chronic reflux, I want you to continue on Nexium.  I will send this prescription to [email or web address], you will need to logon to this website and set up an account with the [facility] that you gave me.  From there you should be able to pay for the medication and have it mailed to your house.  Please let us know if this works out.  For your constipation I will try to get you on a different medication called Amitiza.  We will more than likely need to fill out a prior authorization for this though I did send it to your pharmacy today.  Dose is 24 mg twice daily.  You may have an initial washout and have numerous bowel movements the first couple days.  This should get better as your colon cleans out.  Follow-up in 4 months.  It was great seeing you again today.  Dr. Abbey Chatters  At [facility] we value your feedback. You may receive a survey about your visit today. Please share your experience as we strive to create trusting relationships with our patients to provide genuine, compassionate, quality care.  We appreciate your understanding and patience as we review any laboratory studies, imaging, and other diagnostic tests that are ordered as we care for you. Our office policy is 5 business days for review of these results, and any emergent or urgent results are addressed in a timely manner for your best interest. If you do not hear from our office in 1 week, please contact us.   We also encourage the use of MyChart, which contains your medical information for your review as well. If you are not enrolled in this feature, an access code is on this after visit summary for your convenience. Thank you for allowing [place] to be involved in your care.  It was great to see you today!  I hope you have a great rest of your summer!!    Elon Alas. Abbey Chatters, D.O. Gastroenterology and Hepatology [facility]

## 2021-08-10 NOTE — Progress Notes (Signed)
Referring Provider: Kathyrn Drown, MD Primary Care Physician:  Kathyrn Drown, MD Primary GI:  Dr. Abbey Chatters  Chief Complaint  Patient presents with   Gastroesophageal Reflux    Still belching/gas   Constipation    BM's every 5-7 days    HPI:   Carla Wise is a 55 y.o. female who presents   Past Medical History:  Diagnosis Date   Barrett's esophagus    Constipation    Gastric adenoma 02/2017   GERD (gastroesophageal reflux disease)    PONV (postoperative nausea and vomiting)     Past Surgical History:  Procedure Laterality Date   24 HOUR [place] STUDY N/A 07/28/2020   Procedure: 24 HOUR [place] STUDY;  Surgeon: Lavena Bullion, DO;   No evidence of pathologic gastroesophageal reflux.  Reflux symptoms slightly elevated, predominantly weekly.  Increase weekly acid reflux symptoms could be secondary to supragastric belching. Recommended trial of baclofen, not helpful   BALLOON DILATION N/A 03/17/2021   Procedure: BALLOON DILATION;  Surgeon: [name], DO;  [facility];  Service: Endoscopy;  Laterality: N/A;   BIOPSY  01/30/2019   Procedure: BIOPSY;  Surgeon: Danie Binder, MD;  [facility];  Service: Endoscopy;;   BIOPSY  03/17/2021   Procedure: BIOPSY;  Surgeon: [name], DO;  [facility];  Service: Endoscopy;;  gastric GEJ   COLONOSCOPY WITH PROPOFOL N/A 03/17/2021   Procedure: COLONOSCOPY WITH PROPOFOL;  Surgeon: [name], DO;  [facility];  Service: Endoscopy;  Laterality: N/A;  PM   ESOPHAGEAL MANOMETRY N/A 07/27/2020   Procedure: ESOPHAGEAL MANOMETRY (EM);  Surgeon: Lavena Bullion, DO;  Normal   ESOPHAGOGASTRODUODENOSCOPY N/A 03/01/2017   Dr. Oneida Alar: benign-appearing peptic stricture s/p dilation, gastric polyp (tubular adenoma), gastritis, duodenal diverticulm   ESOPHAGOGASTRODUODENOSCOPY N/A 01/30/2019   Procedure: ESOPHAGOGASTRODUODENOSCOPY (EGD);  Surgeon: Danie Binder, MD;  Benign-appearing  esophageal stenosis, single 4 mm sessile polyp (hyperplastic), mild gastritis, normal examined duodenum (pathology- Peptic duodenitis)   ESOPHAGOGASTRODUODENOSCOPY (EGD) WITH PROPOFOL N/A 03/17/2021   Procedure: ESOPHAGOGASTRODUODENOSCOPY (EGD) WITH PROPOFOL;  Surgeon: [name], DO;  [facility];  Service: Endoscopy;  Laterality: N/A;   EXAM UNDER ANESTHESIA WITH MANIPULATION OF SHOULDER Left 06/06/2017   Procedure: EXAM UNDER ANESTHESIA WITH MANIPULATION OF SHOULDER;  Surgeon: Carole Civil, MD;  Location: [facility];  Service: Orthopedics;  Laterality: Left;  full relaxation   FOOT SURGERY Left 2003   hammer toe- great toe   KNEE ARTHROSCOPY W/ MENISCECTOMY     left knee   POLYPECTOMY  03/17/2021   Procedure: POLYPECTOMY;  Surgeon: [name], DO;  [facility];  Service: Endoscopy;;   SAVORY DILATION N/A 03/01/2017   Procedure: SAVORY DILATION;  Surgeon: Danie Binder, MD;  [facility];  Service: Endoscopy;  Laterality: N/A;   UPPER GASTROINTESTINAL ENDOSCOPY  05/20/2020   Dr. Bryan Lemma: Benign stricture dilated 20 mm TTS balloon then fractured with forceps, 1 cm sliding-type HH, esophageal ulcer x2, Hill grade 3 valve, mild gastritis, no H. pylori.  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 Needs repeat EGD in 1 year.     Current Outpatient Medications  Medication Sig Dispense Refill   hydrocortisone (ANUSOL-HC) 2.5 % rectal cream Place 1 application rectally 4 (four) times daily. Apply small pea sized amount per your rectum 2-4 times daily x10-14 days. (Patient taking differently: Place 1 application rectally 4 (four) times daily.  As needed) 30 g 1   ibuprofen (ADVIL) 800 MG tablet Take 1 tablet (800 mg total) by mouth every 8 (eight) hours as needed. 90 tablet 1   lidocaine (XYLOCAINE) 5 % ointment Apply 1 application topically 2 (two) times daily as needed. Apply a small pea sized amount per rectum twice daily for rectal  pain related to hemorrhoids. 30 g 1   lubiprostone (AMITIZA) 24 MCG capsule Take 1 capsule (24 mcg total) by mouth 2 (two) times daily with a meal. 60 capsule 11   rosuvastatin (CRESTOR) 5 MG tablet TAKE 1 TABLET BY MOUTH ON MONDAY, WEDNESDAY AND FRIDAY FOR CHOLESTEROL. (Patient taking differently: Take 5 mg by mouth every Monday, Wednesday, and Friday.) 12 tablet 3   zolpidem (AMBIEN) 5 MG tablet TAKE ONE TABLET BY MOUTH AT BEDTIME AS NEEDED. DO NOT TAKE WITH XANAX. 30 tablet 2   esomeprazole (NEXIUM) 40 MG capsule Take 1 capsule (40 mg total) by mouth 2 (two) times daily before a meal. 60 capsule 11   gabapentin (NEURONTIN) 100 MG capsule Take 1 capsule (100 mg total) by mouth 3 (three) times daily. (Patient not taking: Reported on 08/09/2021) 90 capsule 2   No current facility-administered medications for this visit.    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   Tobacco Use   Smoking status: Every Day    Packs/day: 0.50    Years: 25.00    Pack years: 12.50    Types: Cigarettes   Smokeless tobacco: Never   Tobacco comments:    Smokes 5 cigs daily  Vaping Use   Vaping Use: Never used  Substance and Sexual Activity   Alcohol use: No   Drug use: No   Sexual activity: Yes    Birth control/protection: Surgical  Other Topics Concern   Not on file  Social History Narrative   Not on file   Social  Determinants of Health   Financial Resource Strain: Not on file  Food Insecurity: Not on file  Transportation Needs: Not on file  Physical Activity: Not on file  Stress: Not on file  Social Connections: Not on file    Subjective: Review of Systems  Constitutional:  Negative for chills and fever.  HENT:  Negative for congestion and hearing loss.   Eyes:  Negative for blurred vision and double vision.  Respiratory:  Negative for cough and shortness of breath.   Cardiovascular:  Negative for chest pain and palpitations.  Gastrointestinal:  Positive for constipation and heartburn. Negative for abdominal pain, blood in stool, diarrhea, melena and vomiting.  Genitourinary:  Negative for dysuria and urgency.  Musculoskeletal:  Negative for joint pain and myalgias.  Skin:  Negative for itching and rash.  Neurological:  Negative for dizziness and headaches.  Psychiatric/Behavioral:  Negative for depression. The patient is not nervous/anxious.     Objective: BP 112/74   Pulse 84   Temp 98 F (36.7 C)   Ht '5\' 3"'$  (1.6 m)   Wt 105 lb 9.6 oz (47.9 kg)   BMI 18.71 kg/m  Physical Exam Constitutional:      Appearance: Normal appearance.  HENT:     Head: Normocephalic and atraumatic.  Eyes:     Extraocular Movements: Extraocular movements intact.     Conjunctiva/sclera: Conjunctivae normal.  Cardiovascular:     Rate and Rhythm: Normal rate and regular rhythm.  Pulmonary:     Effort: Pulmonary effort is normal.     Breath sounds: Normal breath sounds.  Abdominal:     General: Bowel sounds are normal.     Palpations: Abdomen is soft.  Musculoskeletal:        General: No swelling. Normal range of motion.     Cervical back: Normal range of motion and neck supple.  Skin:    General: Skin is warm and dry.     Coloration: Skin is not jaundiced.  Neurological:     General: No focal deficit present.     Mental Status: She is alert and oriented to person, place, and time.  Psychiatric:         Mood and Affect: Mood normal.        Behavior: Behavior normal.     Assessment: *GERD-relatively well controlled on twice daily Nexium *Constipation not well controlled  Plan: Patient's GERD is well controlled on twice daily Nexium though there have been issues with cost in the past.  She is currently taking over-the-counter dosing and her symptoms are worse.  I will send her Nexium 40 mg twice daily to [email or web address] and see if we can get it cheaper for her.  I counseled her that she will need to logon to the website, create an account, and use the email address that she provide me today that I put on her prescription and they should mail her the medication.  She will call the office if she has any issues.  Constipation not well controlled on over-the-counter medications.  She was well controlled on Linzess however despite coupons, patient assistance, prior authorizations, we have been unable to get this covered for her at a price point she can afford.  I will trial her on Amitiza 24 mg twice daily.  We will write prior authorization for this medication.  Otherwise follow-up in 4 months  08/10/2021 12:19 PM   Disclaimer: This note was dictated with voice recognition software. Similar sounding words can inadvertently be transcribed and may not be corrected upon review.

## 2021-08-11 ENCOUNTER — Telehealth: Payer: Self-pay | Admitting: Internal Medicine

## 2021-08-11 NOTE — Telephone Encounter (Signed)
Dr. Abbey Chatters told patient to set up account with [email or web address] and the prescription for her nexium would be in there.  She stated that it is not there, can that please be sent in

## 2021-08-15 ENCOUNTER — Telehealth: Payer: Self-pay | Admitting: Internal Medicine

## 2021-08-15 NOTE — Telephone Encounter (Signed)
PA for Amitiza was denied due to pt not trying and failing Trulance. Do you want to call in Trulance and try to get that covered?

## 2021-08-16 ENCOUNTER — Other Ambulatory Visit: Payer: Self-pay | Admitting: Internal Medicine

## 2021-08-16 MED ORDER — TRULANCE 3 MG PO TABS: 1.0000 | ORAL_TABLET | Freq: Every day | ORAL | 3 refills | Status: DC

## 2021-08-16 NOTE — Telephone Encounter (Signed)
PA was approved for Trulance 3 mg tablets. Approval letter to be scanned into patient's chart.

## 2021-08-16 NOTE — Telephone Encounter (Signed)
Spoke with Dr. Abbey Chatters and got ok to send in Trulance to the pharmacy. Will await for PA approval/denial.

## 2021-08-17 ENCOUNTER — Telehealth: Payer: Self-pay | Admitting: Internal Medicine

## 2021-08-17 DIAGNOSIS — M1612 Unilateral primary osteoarthritis, left hip: Secondary | ICD-10-CM | POA: Diagnosis not present

## 2021-08-17 DIAGNOSIS — M25552 Pain in left hip: Secondary | ICD-10-CM | POA: Diagnosis not present

## 2021-08-17 NOTE — Telephone Encounter (Signed)
Pt called to say that [facility] told her that they haven't received the PA on her Trulance. In her chart it says the PA was approved and prescription sent and confirmed receipt. Please advise and call patient. [phone] or [phone]

## 2021-08-17 NOTE — Telephone Encounter (Signed)
Spoke with [employer] and they were able to run the approval. Pt was notified.

## 2021-08-17 NOTE — Telephone Encounter (Signed)
Tammy handled this

## 2021-09-04 ENCOUNTER — Other Ambulatory Visit: Payer: Self-pay | Admitting: Gastroenterology

## 2021-09-04 DIAGNOSIS — K649 Unspecified hemorrhoids: Secondary | ICD-10-CM

## 2021-09-13 DIAGNOSIS — H04123 Dry eye syndrome of bilateral lacrimal glands: Secondary | ICD-10-CM | POA: Diagnosis not present

## 2021-10-09 DIAGNOSIS — R269 Unspecified abnormalities of gait and mobility: Secondary | ICD-10-CM | POA: Diagnosis not present

## 2021-10-09 DIAGNOSIS — M25552 Pain in left hip: Secondary | ICD-10-CM | POA: Diagnosis not present

## 2021-10-12 DIAGNOSIS — M25552 Pain in left hip: Secondary | ICD-10-CM | POA: Diagnosis not present

## 2021-10-13 DIAGNOSIS — M25552 Pain in left hip: Secondary | ICD-10-CM | POA: Diagnosis not present

## 2021-12-05 ENCOUNTER — Encounter: Payer: Self-pay | Admitting: Internal Medicine

## 2021-12-10 DIAGNOSIS — R87629 Unspecified abnormal cytological findings in specimens from vagina: Secondary | ICD-10-CM

## 2021-12-10 HISTORY — DX: Unspecified abnormal cytological findings in specimens from vagina: R87.629

## 2021-12-12 ENCOUNTER — Encounter: Payer: Self-pay | Admitting: Nurse Practitioner

## 2021-12-12 ENCOUNTER — Ambulatory Visit (INDEPENDENT_AMBULATORY_CARE_PROVIDER_SITE_OTHER): Payer: BC Managed Care – PPO | Admitting: Nurse Practitioner

## 2021-12-12 ENCOUNTER — Other Ambulatory Visit: Payer: Self-pay

## 2021-12-12 VITALS — BP 118/76 | HR 88 | Temp 98.7°F | Ht 63.0 in | Wt 103.2 lb

## 2021-12-12 DIAGNOSIS — R0981 Nasal congestion: Secondary | ICD-10-CM

## 2021-12-12 DIAGNOSIS — H9203 Otalgia, bilateral: Secondary | ICD-10-CM | POA: Diagnosis not present

## 2021-12-12 MED ORDER — AMOXICILLIN-POT CLAVULANATE 500-125 MG PO TABS: ORAL_TABLET | ORAL | 0 refills | Status: DC

## 2021-12-12 NOTE — Progress Notes (Signed)
Subjective:    Patient ID: Carla Wise, female    DOB: 07-01-1966, 56 y.o.   MRN: [id number]  HPI Patient arrives with ear pain, headache and congestion x3 days. Patient also admits to runny nose, body aches, and headache. Patient denies SOB, difficulty breathing, palpitations, fevers, chills.   Patient states that she gets ear pain every time she has a URI and the only thing that helps is an abx. Patient states that she has tried nasal saline sprays, mucinex, ear drops, ibuprofen, and tylenol without relief in the past. Patient also states that Z-packs and amoxicillin are not helpful.    Review of Systems  Constitutional:  Negative for chills and fever.  HENT:  Positive for congestion, ear pain and rhinorrhea. Negative for ear discharge, hearing loss, postnasal drip, sinus pressure, sinus pain and sore throat.   Respiratory:  Positive for cough. Negative for chest tightness, shortness of breath and wheezing.   Cardiovascular:  Negative for chest pain and palpitations.  Neurological:  Positive for headaches.  All other systems reviewed and are negative.     Objective:   Physical Exam Constitutional:      General: She is not in acute distress.    Appearance: Normal appearance. She is normal weight. She is not ill-appearing or toxic-appearing.  HENT:     Head: Normocephalic.     Right Ear: Ear canal and external ear normal. There is no impacted cerumen.     Left Ear: Ear canal and external ear normal. There is no impacted cerumen.     Ears:     Comments: BIL TM slightly injected.    Nose: Nose normal. No congestion or rhinorrhea.     Mouth/Throat:     Mouth: Mucous membranes are moist.     Pharynx: Oropharynx is clear. No oropharyngeal exudate or posterior oropharyngeal erythema.  Eyes:     Extraocular Movements: Extraocular movements intact.     Pupils: Pupils are equal, round, and reactive to light.  Cardiovascular:     Rate and Rhythm: Normal rate and regular rhythm.      Pulses: Normal pulses.     Heart sounds: Normal heart sounds. No murmur heard. Pulmonary:     Effort: Pulmonary effort is normal. No respiratory distress.     Breath sounds: Normal breath sounds. No wheezing.  Musculoskeletal:        General: Normal range of motion.     Cervical back: Normal range of motion and neck supple. No rigidity or tenderness.     Comments: Wearing hip brace to left hip  Lymphadenopathy:     Cervical: No cervical adenopathy.  Skin:    General: Skin is warm.  Neurological:     General: No focal deficit present.     Mental Status: She is alert and oriented to person, place, and time.  Psychiatric:        Mood and Affect: Mood normal.        Behavior: Behavior normal.          Assessment & Plan:   1. Congestion of nasal sinus - Likely viral etiology such as flu however due to patient's smoking hx bacterial etiology also considered. - COVID-19, Flu A+B and RSV - RTC if symptoms worsening or not improved in ~7 days.   2. Otalgia of both ears - Likely sequelae from viral illness; however due to hx of smoking of hx of otitis media also considered bacterial etiology. - Discussed OTC medications for comfort. Patient  declined wanting to use or try OTC medications stating that they have not worked for her in the past.  - Augmentin 500mg /125mg  BID x7 days ordered with instructions only to take if COVID, flu, and RSV testing negative.  - Discussed the dangers of taking abx for viral illnesses in detail.  - RTC if symptoms worsening or not improved ~7 days.

## 2021-12-13 LAB — COVID-19, FLU A+B AND RSV
Influenza A, NAA: NOT DETECTED
Influenza B, NAA: NOT DETECTED
RSV, NAA: NOT DETECTED
SARS-CoV-2, NAA: DETECTED — AB

## 2021-12-13 LAB — SPECIMEN STATUS REPORT

## 2021-12-14 NOTE — Progress Notes (Signed)
Please call the patient with the following message:  COVID was detected. Under current CDC guidelines it is recommended to stay self isolated for at least 5 days.  If feeling well after 5 days may return to normal activities as long as you wears a mask for 5 days.  If you are not feeling well after 5 days you should stay under self-isolation for 10 days.  Warning signs to watch for if you develops chest tightness shortness of breath severe pain change in mental status you should seek further evaluation in the ER.  If further questions or concerns please let us know.  There is no need to take the antibiotics as previously discussed since this a viral illness. If you do not feel better in ~5 days, please come in to be seen to reassess need for abx. If you need a note for work, please let us know. We can give you a note to write you out of work until Monday 12/18/2021 if needed.   I hope you feel better soon.

## 2021-12-28 ENCOUNTER — Encounter: Payer: Self-pay | Admitting: Nurse Practitioner

## 2021-12-28 ENCOUNTER — Other Ambulatory Visit: Payer: Self-pay

## 2021-12-28 ENCOUNTER — Ambulatory Visit: Payer: BC Managed Care – PPO | Admitting: Nurse Practitioner

## 2021-12-28 VITALS — BP 106/82 | HR 72 | Temp 98.2°F | Wt 103.8 lb

## 2021-12-28 DIAGNOSIS — R300 Dysuria: Secondary | ICD-10-CM

## 2021-12-28 LAB — POCT URINALYSIS DIPSTICK
Spec Grav, UA: 1.015 (ref 1.010–1.025)
pH, UA: 7 (ref 5.0–8.0)

## 2021-12-28 MED ORDER — NITROFURANTOIN MONOHYD MACRO 100 MG PO CAPS: 100.0000 mg | ORAL_CAPSULE | Freq: Two times a day (BID) | ORAL | 0 refills | Status: AC

## 2021-12-28 NOTE — Progress Notes (Signed)
° °  Subjective:    Patient ID: Carla Wise, female    DOB: 05-Dec-1966, 56 y.o.   MRN: [id number]  HPI  Patient arrives with dysuria, urinary frequency, and bladder pressure since yesterday. Patient states that did notice a think smear of blood when she wiped this morning. Denies back pain, fever, chills.    Results for orders placed or performed in visit on 12/28/21  POCT urinalysis dipstick  Result Value Ref Range   Color, UA     Clarity, UA     Glucose, UA     Bilirubin, UA     Ketones, UA     Spec Grav, UA 1.015 1.010 - 1.025   Blood, UA moderate    pH, UA 7.0 5.0 - 8.0   Protein, UA     Urobilinogen, UA     Nitrite, UA     Leukocytes, UA Trace (A) Negative   Appearance     Odor      Review of Systems  Genitourinary:  Positive for dysuria and frequency.      Objective:   Physical Exam Constitutional:      General: She is not in acute distress.    Appearance: Normal appearance. She is normal weight. She is not ill-appearing or toxic-appearing.  Cardiovascular:     Rate and Rhythm: Normal rate and regular rhythm.     Pulses: Normal pulses.     Heart sounds: Normal heart sounds. No murmur heard. Pulmonary:     Effort: Pulmonary effort is normal. No respiratory distress.     Breath sounds: No wheezing.  Abdominal:     General: Abdomen is flat.     Palpations: Abdomen is soft.     Tenderness: There is no right CVA tenderness or left CVA tenderness.  Musculoskeletal:        General: Normal range of motion.     Comments: Brace to left hip  Skin:    General: Skin is warm and dry.  Neurological:     General: No focal deficit present.     Mental Status: She is alert and oriented to person, place, and time.  Psychiatric:        Mood and Affect: Mood normal.        Behavior: Behavior normal.          Assessment & Plan:   1. Dysuria - Likely UTI - POCT urinalysis dipstick - Urine reviewed under microscope. Small amount of blood noted.  - Urine Culture -  nitrofurantoin, macrocrystal-monohydrate, (MACROBID) 100 MG capsule; Take 1 capsule (100 mg total) by mouth 2 (two) times daily for 5 days.  Dispense: 10 capsule; Refill: 0 - RTC if symptoms note better in while on medication - RTC if symptoms worse or if you develop fever, chills, back pain.

## 2022-01-01 LAB — URINE CULTURE

## 2022-02-03 ENCOUNTER — Other Ambulatory Visit: Payer: Self-pay | Admitting: Gastroenterology

## 2022-02-03 DIAGNOSIS — K649 Unspecified hemorrhoids: Secondary | ICD-10-CM

## 2022-02-05 NOTE — Telephone Encounter (Signed)
Last office visit 08/09/2021

## 2022-03-06 ENCOUNTER — Telehealth: Payer: Self-pay | Admitting: Internal Medicine

## 2022-03-06 NOTE — Telephone Encounter (Signed)
Returned the pt's call and LMOVM for the pt to return call ?

## 2022-03-06 NOTE — Telephone Encounter (Signed)
Pt has questions about a prescription (Nexium) and is aware of her OV on Monday. [phone] ?

## 2022-03-07 NOTE — Progress Notes (Deleted)
GI Office Note    Referring Provider: Babs Sciara, MD Primary Care Physician:  Babs Sciara, MD Primary GI: Dr. Marletta Lor  Date:  03/07/2022  ID:  Carla Wise, DOB Jan 28, 1966, MRN [id number]   Chief Complaint   No chief complaint on file.    History of Present Illness  Carla Wise is a 56 y.o. female with a history of presenting today for medication refill.  Last EGD 03/17/21: Mild Schatzki's ring s/p dilated, gastritis, normal duodenum, esophageal squamous and cardiac mucosa with mild, nonspecific carditis  Last Colonoscopy 03/17/21 : Nonbleeding internal hemorrhoids, diverticulosis in the sigmoid colon, one 11 mm polyp at the hepatic flexure removed with mucosal resection (sessile serrated polyp), one 4 mm polyp in the transverse colon (sessile serrated polyp). Repeat in 3 years.   Last office visit 08/09/21 - GERD was well controlled on OTC Nexium BID (attempt made to send to [facility] to make it cheaper for her), was unable to afford Linzess so was prescribed amatiza, however insurance denied this and patient was given trulance which was approved.      GERD Patient complains of {GERD cc:13194}. Symptoms have been present for approximately {numbers 0-10:33138} {time units:11}. Symptoms include {GERD sx:13195}. The patient denies {GERD sx:13195}. Symptoms appear to be worsened by {GERD exacer:13199}. Risk factors present for GERD include {GERD risk factors:13164}. Risk factors absent for GERD are {GERD risk factors:13164}. Studies performed so far include {GERD workup:13196}. Treatments tried so far include {GERD tx:13197}. Results of treatment: {GERD tx results:13198}. Currently, the symptoms are {severity:5014} and occur approximately {0-10:33138} times per {time units:11}.  Constipation Patient complains of constipation. Onset was {1-10:13787} {time; units:19136} ago. Patient has been having {frequency:19610} {stool descriptions:14201} stools {time  unit:14011::per week}. Defecation has been {defecation:14202}. Co-Morbid conditions:{co-morbid:14207}. Symptoms have {course:19445}. Current Health Habits: Eating fiber? {yes***/no:17258}, Exercise? {yes***/no:17258}, Adequate hydration? {yes***/no:17258}. Current over the counter/prescription laxative: {laxative list:14204} which has been {effective/ineffective:19538}.    Past Medical History:  Diagnosis Date   Barrett's esophagus    Constipation    Gastric adenoma 02/2017   GERD (gastroesophageal reflux disease)    PONV (postoperative nausea and vomiting)     Past Surgical History:  Procedure Laterality Date   64 HOUR PH STUDY N/A 07/28/2020   Procedure: 24 HOUR PH STUDY;  Surgeon: Shellia Cleverly, DO;   No evidence of pathologic gastroesophageal reflux.  Reflux symptoms slightly elevated, predominantly weekly.  Increase weekly acid reflux symptoms could be secondary to supragastric belching. Recommended trial of baclofen, not helpful   BALLOON DILATION N/A 03/17/2021   Procedure: BALLOON DILATION;  Surgeon: Lanelle Bal, DO;  [facility];  Service: Endoscopy;  Laterality: N/A;   BIOPSY  01/30/2019   Procedure: BIOPSY;  Surgeon: West Bali, MD;  [facility];  Service: Endoscopy;;   BIOPSY  03/17/2021   Procedure: BIOPSY;  Surgeon: Lanelle Bal, DO;  [facility];  Service: Endoscopy;;  gastric GEJ   COLONOSCOPY WITH PROPOFOL N/A 03/17/2021   Procedure: COLONOSCOPY WITH PROPOFOL;  Surgeon: Lanelle Bal, DO;  [facility];  Service: Endoscopy;  Laterality: N/A;  PM   ESOPHAGEAL MANOMETRY N/A 07/27/2020   Procedure: ESOPHAGEAL MANOMETRY (EM);  Surgeon: Shellia Cleverly, DO;  Normal   ESOPHAGOGASTRODUODENOSCOPY N/A 03/01/2017   Dr. Darrick Penna: benign-appearing peptic stricture s/p dilation, gastric polyp (tubular adenoma), gastritis, duodenal diverticulm   ESOPHAGOGASTRODUODENOSCOPY N/A 01/30/2019   Procedure:  ESOPHAGOGASTRODUODENOSCOPY (EGD);  Surgeon: West Bali, MD;  Benign-appearing esophageal stenosis, single 4 mm sessile polyp (hyperplastic), mild gastritis, normal examined duodenum (pathology- Peptic duodenitis)   ESOPHAGOGASTRODUODENOSCOPY (EGD) WITH PROPOFOL N/A 03/17/2021   Procedure: ESOPHAGOGASTRODUODENOSCOPY (EGD) WITH PROPOFOL;  Surgeon: Lanelle Bal, DO;  [facility];  Service: Endoscopy;  Laterality: N/A;   EXAM UNDER ANESTHESIA WITH MANIPULATION OF SHOULDER Left 06/06/2017   Procedure: EXAM UNDER ANESTHESIA WITH MANIPULATION OF SHOULDER;  Surgeon: Vickki Hearing, MD;  Location: [facility];  Service: Orthopedics;  Laterality: Left;  full relaxation   FOOT SURGERY Left 2003   hammer toe- great toe   KNEE ARTHROSCOPY W/ MENISCECTOMY     left knee   POLYPECTOMY  03/17/2021   Procedure: POLYPECTOMY;  Surgeon: Lanelle Bal, DO;  [facility];  Service: Endoscopy;;   SAVORY DILATION N/A 03/01/2017   Procedure: SAVORY DILATION;  Surgeon: West Bali, MD;  [facility];  Service: Endoscopy;  Laterality: N/A;   UPPER GASTROINTESTINAL ENDOSCOPY  05/20/2020   Dr. Barron Alvine: Benign stricture dilated 20 mm TTS balloon then fractured with forceps, 1 cm sliding-type HH, esophageal ulcer x2, Hill grade 3 valve, mild gastritis, no H. pylori.  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 Needs repeat EGD in 1 year.     Current Outpatient Medications  Medication Sig Dispense Refill   esomeprazole (NEXIUM) 40 MG capsule Take 1 capsule (40 mg total) by mouth 2 (two) times daily before a meal. 60 capsule 11   gabapentin (NEURONTIN) 100 MG capsule Take 1 capsule (100 mg total) by mouth 3 (three) times daily. (Patient not taking: Reported on 08/09/2021) 90 capsule 2   hydrocortisone (ANUSOL-HC) 2.5 % rectal cream APPLY A SMALL PEA SIZED AMOUNT PER YOUR RECTUM 2 TO 4 TIMES DAILY FOR 10 TO 14 DAYS. 30 g 0   ibuprofen (ADVIL) 800 MG tablet Take 1  tablet (800 mg total) by mouth every 8 (eight) hours as needed. 90 tablet 1   lidocaine (XYLOCAINE) 5 % ointment APPLY A SMALL PEA SIZED AMOUNT PER RECTUM TWICE DAILY FOR RECTAL PAIN RELATED TO HEMMORRHOIDS. 35.44 g 0   Plecanatide (TRULANCE) 3 MG TABS Take 1 tablet by mouth daily. 30 tablet 3   rosuvastatin (CRESTOR) 5 MG tablet TAKE 1 TABLET BY MOUTH ON MONDAY, WEDNESDAY AND FRIDAY FOR CHOLESTEROL. (Patient taking differently: Take 5 mg by mouth every Monday, Wednesday, and Friday.) 12 tablet 3   zolpidem (AMBIEN) 5 MG tablet TAKE ONE TABLET BY MOUTH AT BEDTIME AS NEEDED. DO NOT TAKE WITH XANAX. 30 tablet 2   No current facility-administered medications for this visit.    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   Tobacco Use   Smoking status: Every Day    Packs/day: 0.50  Years: 25.00    Pack years: 12.50    Types: Cigarettes   Smokeless tobacco: Never   Tobacco comments:    Smokes 5 cigs daily  Vaping Use   Vaping Use: Never used  Substance and Sexual Activity   Alcohol use: No   Drug use: No   Sexual activity: Yes    Birth control/protection: Surgical  Other Topics Concern   Not on file  Social History Narrative   Not on file   Social Determinants of Health   Financial Resource Strain: Not on file  Food Insecurity: Not on file  Transportation Needs: Not on file  Physical Activity: Not on  file  Stress: Not on file  Social Connections: Not on file     Review of Systems   Gen: Denies fever, chills, anorexia. Denies fatigue, weakness, weight loss.  CV: Denies chest pain, palpitations, syncope, peripheral edema, and claudication. Resp: Denies dyspnea at rest, cough, wheezing, coughing up blood, and pleurisy. GI: Denies vomiting blood, jaundice, and fecal incontinence.   Denies dysphagia or odynophagia. Derm: Denies rash, itching, dry skin Psych: Denies depression, anxiety, memory loss, confusion. No homicidal or suicidal ideation.  Heme: Denies bruising, bleeding, and enlarged lymph nodes.   Physical Exam   There were no vitals taken for this visit.  General:   Alert and oriented. No distress noted. Pleasant and cooperative.  Head:  Normocephalic and atraumatic. Eyes:  Conjuctiva clear without scleral icterus. Mouth:  Oral mucosa pink and moist. Good dentition. No lesions. Lungs:  Clear to auscultation bilaterally. No wheezes, rales, or rhonchi. No distress.  Heart:  S1, S2 present without murmurs appreciated.  Abdomen:  +BS, soft, non-tender and non-distended. No rebound or guarding. No HSM or masses noted. Rectal: *** Msk:  Symmetrical without gross deformities. Normal posture. Extremities:  Without edema. Neurologic:  Alert and  oriented x4 Psych:  Alert and cooperative. Normal mood and affect.   Assessment  Carla Wise is a 56 y.o. female presenting today with   GERD:  Constipation:     PLAN   *****     Brooke Bonito, MSN, FNP-BC, AGACNP-BC [facility]

## 2022-03-08 NOTE — Telephone Encounter (Signed)
Returned the pt's call and LMOVM to call back ?

## 2022-03-12 ENCOUNTER — Ambulatory Visit: Payer: BC Managed Care – PPO | Admitting: Gastroenterology

## 2022-03-19 ENCOUNTER — Ambulatory Visit: Payer: Managed Care, Other (non HMO) | Admitting: Gastroenterology

## 2022-03-19 ENCOUNTER — Encounter: Payer: Self-pay | Admitting: Gastroenterology

## 2022-03-19 VITALS — BP 110/72 | HR 78 | Temp 97.9°F | Ht 62.0 in | Wt 101.8 lb

## 2022-03-19 DIAGNOSIS — K2270 Barrett's esophagus without dysplasia: Secondary | ICD-10-CM | POA: Diagnosis not present

## 2022-03-19 DIAGNOSIS — K5900 Constipation, unspecified: Secondary | ICD-10-CM

## 2022-03-19 MED ORDER — LINACLOTIDE 145 MCG PO CAPS: 145.0000 ug | ORAL_CAPSULE | Freq: Every day | ORAL | Status: DC

## 2022-03-19 MED ORDER — LINACLOTIDE 145 MCG PO CAPS: 145.0000 ug | ORAL_CAPSULE | Freq: Every day | ORAL | 1 refills | Status: DC

## 2022-03-19 NOTE — Progress Notes (Signed)
? ?GI Office Note   ? ?Referring Provider: Kathyrn Drown, MD ?Primary Care Physician:  Kathyrn Drown, MD ?Primary GI:  Dr. Abbey Chatters ? ?Date:  03/19/2022  ?ID:  Carla Wise, DOB 01-23-1966, MRN [id number] ? ? ?Chief Complaint  ? ?Chief Complaint  ?Patient presents with  ? Follow-up  ? ? ? ?History of Present Illness  ?Carla Wise is a 56 y.o. female with history of Barrett's esophagus, constipation, gastric adenoma, and GERD, presenting today for further refills of Nexium and ongoing constipation. ? ?Last EGD 03/17/21 - mild schatzki ring s/p dilation, gastritis, normal duodenum ? ?Last seen in the office 08/09/21 -  controlled on twice daily Nexium however not controlled with over-the-counter dose, does cause tissues with prescription dosing past.  Constipation is not well controlled on over-the-counter medications.  Linzess unaffordable in the past.  Was given Amitiza 24 mg twice daily.  Nexium 40 mg BID sent to [email or web address].  Subsequently Amitiza was not approved, ultimately Trulance was approved with prior authorization.  ? ?Today: ? ?GERD ?Patient complains of GERD symptoms of prolonged duration. Symptoms have been present for years. Symptoms include belching, heartburn, and upper abdominal discomfort. The patient denies chest pain, choking on food, cough, difficulty swallowing, dysphagia, hematemesis, laryngitis, melena, shortness of breath, unexpected weight loss, and wheezing. Symptoms appear to be worsened by fatty foods, large meals, lying down after eating, and tomato sauce. Risk factors present for GERD include tobacco abuse and previous diagnosis of Barrett's esophagus. Risk factors absent for GERD are alcohol use. Studies performed so far include upper endoscopy, result: negative for H. Pylori, esophageal pH monitor, result: negative for pathologic GERD, esophageal manometry, result: negative. Treatments tried so far include proton pump inhibitor: nexium. Results of treatment: some  improvement in symptom severity. Currently, the symptoms are mild and intermittent and occur approximately 3 times per week, better with diet adherence. No spicy foods, fried foods, greasy foods. Does admit to cabbage every once In a while.  ? ?Constipation ?Patient complains of constipation. Onset was  some time ago . Patient has been having occasional firm stools 1 per week. Defecation has been difficult at times. Co-Morbid conditions:none. Symptoms have waxed and waned, was better with Linzess in the past however too expensive. Current Health Habits: Eating fiber? no, Exercise? no, Adequate hydration? 2 bottles a day - improved from prior. Current over the counter/prescription laxative:  magnesium citrate  which has been somewhat effective. Prescriptions tried: Linzess (worked but too expensive), Amitiza (not approved), Trulance - not helpful.  ? ?New insurance started March 1, interested in trying linzess again if insurance will cover it. Miralax has not helped in the past. Tried suppository in the past - not a regular basis, does not want to use enemas. Has been using her hemorrhoid cream if needed. No melena or hematochezia. Has been getting better at water intake - 2 bottles a day. Has not been consuming extra fiber.  ? ?Past Medical History:  ?Diagnosis Date  ? Barrett's esophagus   ? Constipation   ? Gastric adenoma 02/2017  ? GERD (gastroesophageal reflux disease)   ? PONV (postoperative nausea and vomiting)   ? ? ?Past Surgical History:  ?Procedure Laterality Date  ? 63 HOUR [place] STUDY N/A 07/28/2020  ? Procedure: [place] STUDY;  Surgeon: Lavena Bullion, DO;   No evidence of pathologic gastroesophageal reflux.  Reflux symptoms slightly elevated, predominantly weekly.  Increase weekly acid reflux symptoms could be secondary to supragastric belching. Recommended  trial of baclofen, not helpful  ? BALLOON DILATION N/A 03/17/2021  ? Procedure: BALLOON DILATION;  Surgeon: [name], DO;  [facility];  Service: Endoscopy;  Laterality: N/A;  ? BIOPSY  01/30/2019  ? Procedure: BIOPSY;  Surgeon: Danie Binder, MD;  [facility];  Service: Endoscopy;;  ? BIOPSY  03/17/2021  ? Procedure: BIOPSY;  Surgeon: [name], DO;  [facility];  Service: Endoscopy;;  gastric ?GEJ  ? COLONOSCOPY WITH PROPOFOL N/A 03/17/2021  ? Procedure: COLONOSCOPY WITH PROPOFOL;  Surgeon: [name], DO;  [facility];  Service: Endoscopy;  Laterality: N/A;  PM  ? ESOPHAGEAL MANOMETRY N/A 07/27/2020  ? Procedure: ESOPHAGEAL MANOMETRY (EM);  Surgeon: Lavena Bullion, DO;  Normal  ? ESOPHAGOGASTRODUODENOSCOPY N/A 03/01/2017  ? Dr. Oneida Alar: benign-appearing peptic stricture s/p dilation, gastric polyp (tubular adenoma), gastritis, duodenal diverticulm  ? ESOPHAGOGASTRODUODENOSCOPY N/A 01/30/2019  ? Procedure: ESOPHAGOGASTRODUODENOSCOPY (EGD);  Surgeon: Danie Binder, MD;  Benign-appearing esophageal stenosis, single 4 mm sessile polyp (hyperplastic), mild gastritis, normal examined duodenum (pathology- Peptic duodenitis)  ? ESOPHAGOGASTRODUODENOSCOPY (EGD) WITH PROPOFOL N/A 03/17/2021  ? Procedure: ESOPHAGOGASTRODUODENOSCOPY (EGD) WITH PROPOFOL;  Surgeon: [name], DO;  [facility];  Service: Endoscopy;  Laterality: N/A;  ? EXAM UNDER ANESTHESIA WITH MANIPULATION OF SHOULDER Left 06/06/2017  ? Procedure: EXAM UNDER ANESTHESIA WITH MANIPULATION OF SHOULDER;  Surgeon: Carole Civil, MD;  Location: [facility];  Service: Orthopedics;  Laterality: Left;  full relaxation  ? FOOT SURGERY Left 2003  ? hammer toe- great toe  ? KNEE ARTHROSCOPY W/ MENISCECTOMY    ? left knee  ? POLYPECTOMY  03/17/2021  ? Procedure: POLYPECTOMY;  Surgeon: [name], DO;  [facility];  Service: Endoscopy;;  ? SAVORY DILATION N/A 03/01/2017  ? Procedure: SAVORY DILATION;  Surgeon: Danie Binder, MD;  [facility];  Service: Endoscopy;  Laterality: N/A;  ? UPPER  GASTROINTESTINAL ENDOSCOPY  05/20/2020  ? Dr. Bryan Lemma: Benign stricture dilated 20 mm TTS balloon then fractured with forceps, 1 cm sliding-type HH, esophageal ulcer x2, Hill grade 3 valve, mild gastritis, no H. pylori.  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 Needs repeat EGD in 1 year.   ? ? ?Current Outpatient Medications  ?Medication Sig Dispense Refill  ? esomeprazole (NEXIUM) 40 MG capsule Take 1 capsule (40 mg total) by mouth 2 (two) times daily before a meal. 60 capsule 11  ? hydrocortisone (ANUSOL-HC) 2.5 % rectal cream APPLY A SMALL PEA SIZED AMOUNT PER YOUR RECTUM 2 TO 4 TIMES DAILY FOR 10 TO 14 DAYS. 30 g 0  ? ibuprofen (ADVIL) 800 MG tablet Take 1 tablet (800 mg total) by mouth every 8 (eight) hours as needed. 90 tablet 1  ? lidocaine (XYLOCAINE) 5 % ointment APPLY A SMALL PEA SIZED AMOUNT PER RECTUM TWICE DAILY FOR RECTAL PAIN RELATED TO HEMMORRHOIDS. 35.44 g 0  ? zolpidem (AMBIEN) 5 MG tablet TAKE ONE TABLET BY MOUTH AT BEDTIME AS NEEDED. DO NOT TAKE WITH XANAX. 30 tablet 2  ? ?Current Facility-Administered Medications  ?Medication Dose Route Frequency Provider Last Rate Last Admin  ? [START ON 03/20/2022] linaclotide (LINZESS) capsule 145 mcg  145 mcg Oral QAC breakfast Sherron Monday, NP      ? ? ?Allergies as of 03/19/2022 - Review Complete 03/19/2022  ?Allergen Reaction Noted  ? Azithromycin  11/23/2013  ? Levaquin [levofloxacin in d5w]  02/04/2015  ? ? ?  Family History  ?Problem Relation Age of Onset  ? Heart disease Other   ? Cancer Other   ? Colon cancer Neg Hx   ? Esophageal cancer Neg Hx   ? Rectal cancer Neg Hx   ? Stomach cancer Neg Hx   ? ? ?Social History  ? ?Socioeconomic History  ? Marital status: Married  ?  Spouse name: Not on file  ? Number of children: Not on file  ? Years of education: 12th grade  ? Highest education level: Not on file  ?Occupational History  ? Occupation: [employer]  ?  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 Smoking status: Every Day  ?   Packs/day: 0.50  ?  Years: 25.00  ?  Pack years: 12.50  ?  Types: Cigarettes  ? Smokeless tobacco: Never  ? Tobacco comments:  ?  Smokes 5 cigs daily  ?Vaping Use  ? Vaping Use: Never used  ?Substance and Sexual Activity  ?

## 2022-03-19 NOTE — Patient Instructions (Signed)
I am giving you 72 mcg samples of Linzess, take 2 capsules once daily. Your prescription will be for 145 mcg daily. Once you receive your prescription you will only take 1 capsule daily.  Let me know how you're doing in 1 - 2 weeks.  ? ?Recommend increasing water and fiber intake daily.  ? ?Continue Nexium 40 mg BID, may use pepcid 10 mg for breakthrough symptoms.  ? ?It was a pleasure to meet you today.  ? ?I want to create trusting relationships with patients. If you receive a survey regarding your visit,  I greatly appreciate you taking time to fill this out on paper or through your MyChart. I value your feedback. ? ?Venetia Night, MSN, FNP-BC, AGACNP-BC ?[facility] ? ? ?

## 2022-05-04 ENCOUNTER — Telehealth: Payer: Self-pay

## 2022-05-04 ENCOUNTER — Ambulatory Visit (INDEPENDENT_AMBULATORY_CARE_PROVIDER_SITE_OTHER): Payer: Managed Care, Other (non HMO) | Admitting: Nurse Practitioner

## 2022-05-04 ENCOUNTER — Other Ambulatory Visit: Payer: Self-pay | Admitting: Nurse Practitioner

## 2022-05-04 ENCOUNTER — Encounter: Payer: Self-pay | Admitting: Nurse Practitioner

## 2022-05-04 VITALS — BP 135/86 | HR 53 | Temp 97.2°F | Ht 62.0 in | Wt 99.0 lb

## 2022-05-04 DIAGNOSIS — E785 Hyperlipidemia, unspecified: Secondary | ICD-10-CM | POA: Diagnosis not present

## 2022-05-04 DIAGNOSIS — R5383 Other fatigue: Secondary | ICD-10-CM | POA: Diagnosis not present

## 2022-05-04 DIAGNOSIS — Z79899 Other long term (current) drug therapy: Secondary | ICD-10-CM | POA: Diagnosis not present

## 2022-05-04 DIAGNOSIS — G4700 Insomnia, unspecified: Secondary | ICD-10-CM

## 2022-05-04 MED ORDER — ZOLPIDEM TARTRATE 5 MG PO TABS: ORAL_TABLET | ORAL | 2 refills | Status: DC

## 2022-05-04 MED ORDER — HYDROXYZINE HCL 25 MG PO TABS: ORAL_TABLET | ORAL | 0 refills | Status: DC

## 2022-05-04 NOTE — Telephone Encounter (Signed)
Nilda Simmer, NP    Just sent this in

## 2022-05-04 NOTE — Progress Notes (Signed)
Subjective:    Patient ID: Carla Wise, female    DOB: April 05, 1966, 56 y.o.   MRN: [id number]  HPI  Patient has been feeling tired and no energy, this has been going on for a month and is getting worse. Has significant sleep issues, waking up a lot at nighttime.  Averages 6 to 7 hours of sleep but feels it is not a deep sleep and most nights wakes up 2-3 times, goes back to sleep without difficulty.  Denies any excessive urination at nighttime.  Denies any snoring or gasping for air.  States her "mind does not shut off".  Takes Ambien 5 mg about twice a week which does help but she tries to avoid taking this every night.  Taking OTC potassium.  Takes this for occasional cramping around the ankle area at nighttime.  Smokes about 5 cigarettes/day.  Denies any alcohol use.  Moderate caffeine intake.     05/04/2022    9:52 AM  Depression screen PHQ 2/9  Decreased Interest 2  Down, Depressed, Hopeless 0  PHQ - 2 Score 2  Altered sleeping 3  Tired, decreased energy 3  Change in appetite 0  Feeling bad or failure about yourself  0  Trouble concentrating 1  Moving slowly or fidgety/restless 0  Suicidal thoughts 0  PHQ-9 Score 9  Difficult doing work/chores Not difficult at all      05/04/2022    9:52 AM  GAD 7 : Generalized Anxiety Score  Nervous, Anxious, on Edge 3  Control/stop worrying 2  Worry too much - different things 2  Trouble relaxing 3  Restless 3  Easily annoyed or irritable 2  Afraid - awful might happen 0  Total GAD 7 Score 15  Anxiety Difficulty Not difficult at all      Review of Systems  HENT:  Negative for sore throat and trouble swallowing.   Respiratory:  Negative for cough, chest tightness, shortness of breath and wheezing.   Cardiovascular:  Negative for chest pain.  Psychiatric/Behavioral:  Positive for sleep disturbance. Negative for suicidal ideas. The patient is nervous/anxious.       Objective:   Physical Exam NAD.  Alert, oriented.  Making  good eye contact.  Dressed appropriately.  Mildly anxious affect.  Thoughts logical coherent and relevant.  Speech clear.  Thyroid nontender to palpation, no tenderness or mass noted.  Lungs clear.  Heart regular rate rhythm.  Today's Vitals   05/04/22 0853  BP: 135/86  Pulse: (!) 53  Temp: (!) 97.2 F (36.2 C)  TempSrc: Temporal  SpO2: 97%  Weight: 99 lb (44.9 kg)  Height: '5\' 2"'$  (1.575 m)   Body mass index is 18.11 kg/m.        Assessment & Plan:   Problem List Items Addressed This Visit       Other   Fatigue - Primary   Relevant Orders   CBC with Differential   Comprehensive Metabolic Panel (CMET)   Lipid Profile   TSH   Magnesium   B12   Hyperlipidemia   Relevant Orders   CBC with Differential   Comprehensive Metabolic Panel (CMET)   Lipid Profile   TSH   Magnesium   B12   Insomnia   Relevant Orders   CBC with Differential   Comprehensive Metabolic Panel (CMET)   Lipid Profile   TSH   Magnesium   B12   Other Visit Diagnoses     High risk medication use  Relevant Orders   CBC with Differential   Comprehensive Metabolic Panel (CMET)   Lipid Profile   TSH   Magnesium   B12      Routine labs ordered including screening for fatigue. Meds ordered this encounter  Medications   zolpidem (AMBIEN) 5 MG tablet    Sig: TAKE ONE TABLET BY MOUTH AT BEDTIME AS NEEDED FOR SLEEP    Dispense:  30 tablet    Refill:  2    Order Specific Question:   Supervising Provider    Answer:   Sallee Lange A [[id number]]   Continue low-dose Ambien about twice a week as needed for sleep.  Encourage patient to limit caffeine intake especially in the evenings. GAD-7 score is high, will discuss this with patient further once her lab results are available. Encourage patient to schedule a preventive health physical.

## 2022-05-04 NOTE — Telephone Encounter (Signed)
Patient notified

## 2022-05-04 NOTE — Telephone Encounter (Signed)
Pt called in to inquire about a med that was mentioned to her in her ov today. Pt states that she did get the Ambien, but the new med was not available for pick up from her pharmacy. Pt states that she doesn't remember the name of the med but it is something that is supposed to help her sleep at night. Please advise.  Cb#: [phone]

## 2022-05-05 LAB — CBC WITH DIFFERENTIAL/PLATELET
Basophils Absolute: 0.1 10*3/uL (ref 0.0–0.2)
Basos: 1 %
EOS (ABSOLUTE): 0.1 10*3/uL (ref 0.0–0.4)
Eos: 2 %
Hematocrit: 46.5 % (ref 34.0–46.6)
Hemoglobin: 15.2 g/dL (ref 11.1–15.9)
Immature Grans (Abs): 0 10*3/uL (ref 0.0–0.1)
Immature Granulocytes: 0 %
Lymphocytes Absolute: 1.6 10*3/uL (ref 0.7–3.1)
Lymphs: 24 %
MCH: 28.6 pg (ref 26.6–33.0)
MCHC: 32.7 g/dL (ref 31.5–35.7)
MCV: 88 fL (ref 79–97)
Monocytes Absolute: 0.3 10*3/uL (ref 0.1–0.9)
Monocytes: 5 %
Neutrophils Absolute: 4.2 10*3/uL (ref 1.4–7.0)
Neutrophils: 68 %
Platelets: 267 10*3/uL (ref 150–450)
RBC: 5.31 x10E6/uL — ABNORMAL HIGH (ref 3.77–5.28)
RDW: 13.7 % (ref 11.7–15.4)
WBC: 6.4 10*3/uL (ref 3.4–10.8)

## 2022-05-05 LAB — MAGNESIUM: Magnesium: 2.3 mg/dL (ref 1.6–2.3)

## 2022-05-05 LAB — COMPREHENSIVE METABOLIC PANEL
ALT: 6 IU/L (ref 0–32)
AST: 13 IU/L (ref 0–40)
Albumin/Globulin Ratio: 2.4 — ABNORMAL HIGH (ref 1.2–2.2)
Albumin: 5 g/dL — ABNORMAL HIGH (ref 3.8–4.9)
Alkaline Phosphatase: 88 IU/L (ref 44–121)
BUN/Creatinine Ratio: 13 (ref 9–23)
BUN: 10 mg/dL (ref 6–24)
Bilirubin Total: 0.3 mg/dL (ref 0.0–1.2)
CO2: 25 mmol/L (ref 20–29)
Calcium: 9.8 mg/dL (ref 8.7–10.2)
Chloride: 103 mmol/L (ref 96–106)
Creatinine, Ser: 0.76 mg/dL (ref 0.57–1.00)
Globulin, Total: 2.1 g/dL (ref 1.5–4.5)
Glucose: 102 mg/dL — ABNORMAL HIGH (ref 70–99)
Potassium: 4.4 mmol/L (ref 3.5–5.2)
Sodium: 140 mmol/L (ref 134–144)
Total Protein: 7.1 g/dL (ref 6.0–8.5)
eGFR: 92 mL/min/{1.73_m2} (ref >59)

## 2022-05-05 LAB — LIPID PANEL
Chol/HDL Ratio: 4.7 ratio — ABNORMAL HIGH (ref 0.0–4.4)
Cholesterol, Total: 213 mg/dL — ABNORMAL HIGH (ref 100–199)
HDL: 45 mg/dL (ref >39)
LDL Chol Calc (NIH): 137 mg/dL — ABNORMAL HIGH (ref 0–99)
Triglycerides: 170 mg/dL — ABNORMAL HIGH (ref 0–149)
VLDL Cholesterol Cal: 31 mg/dL (ref 5–40)

## 2022-05-05 LAB — VITAMIN B12: Vitamin B-12: 283 pg/mL (ref 232–1245)

## 2022-05-05 LAB — TSH: TSH: 0.81 u[IU]/mL (ref 0.450–4.500)

## 2022-07-13 ENCOUNTER — Encounter: Payer: Self-pay | Admitting: Nurse Practitioner

## 2022-07-13 ENCOUNTER — Ambulatory Visit (INDEPENDENT_AMBULATORY_CARE_PROVIDER_SITE_OTHER): Payer: Managed Care, Other (non HMO) | Admitting: Nurse Practitioner

## 2022-07-13 VITALS — BP 135/82 | HR 80 | Temp 97.3°F | Ht 61.0 in | Wt 98.0 lb

## 2022-07-13 DIAGNOSIS — Z1231 Encounter for screening mammogram for malignant neoplasm of breast: Secondary | ICD-10-CM

## 2022-07-13 DIAGNOSIS — Z1151 Encounter for screening for human papillomavirus (HPV): Secondary | ICD-10-CM | POA: Diagnosis not present

## 2022-07-13 DIAGNOSIS — R9230 Dense breasts, unspecified: Secondary | ICD-10-CM

## 2022-07-13 DIAGNOSIS — Z124 Encounter for screening for malignant neoplasm of cervix: Secondary | ICD-10-CM

## 2022-07-13 DIAGNOSIS — Z01419 Encounter for gynecological examination (general) (routine) without abnormal findings: Secondary | ICD-10-CM | POA: Diagnosis not present

## 2022-07-13 DIAGNOSIS — L659 Nonscarring hair loss, unspecified: Secondary | ICD-10-CM

## 2022-07-13 DIAGNOSIS — R922 Inconclusive mammogram: Secondary | ICD-10-CM

## 2022-07-13 NOTE — Patient Instructions (Addendum)
Biotin One bottle of Boost or protein drink Take vitamin D 2000 units per day ([facility] Vitamin D 10,000 units take 2 once a week) Shingles vaccine Tdap vaccine

## 2022-07-13 NOTE — Progress Notes (Unsigned)
Subjective:    Patient ID: Carla Wise, female    DOB: 1966-01-24, 56 y.o.   MRN: [id number]  HPI The patient comes in today for a wellness visit.    A review of their health history was completed.  A review of medications was also completed.  Any needed refills; none  Eating habits: pretty good; no changes in appetite or food intake  Falls/  MVA accidents in past few months: none  Regular exercise: walks about 10 miles a day at work; very hot environment; thinks this has contributed to her few pounds of weight loss  Specialist pt sees on regular basis: GI specialist  Preventative health issues were discussed.   Additional concerns:   Continues to smoke about 6 to 7 cigarettes/day. Had endometrial ablation years ago, no vaginal bleeding or pelvic pain.  Same female sexual partner. Takes MVI, folic acid and flaxseed daily. Gets skin cancer screening with local dermatologist. Main concern is change in her hair texture and hair loss over the past 3 months.  States it is very significant and the hair is coming out by the roots.  Has discussed this with dermatology.  Has started taking biotin over the past couple of weeks. Regular vision and dental exams. Could not take hydroxyzine full tablet due to nausea, plans to try half a tablet for her anxiety.  Takes a rare zolpidem for sleep.  Review of Systems  Constitutional:  Positive for fatigue. Negative for activity change and appetite change.  HENT:  Negative for sore throat and trouble swallowing.        Hair loss x 3 months.   Respiratory:  Negative for cough, chest tightness, shortness of breath and wheezing.   Cardiovascular:  Negative for chest pain and leg swelling.  Gastrointestinal:  Positive for constipation. Negative for abdominal distention, abdominal pain, diarrhea, nausea and vomiting.  Genitourinary:  Negative for difficulty urinating, dysuria, enuresis, frequency, genital sores, pelvic pain, urgency, vaginal  bleeding and vaginal discharge.       Objective:   Physical Exam Vitals and nursing note reviewed.  Constitutional:      General: She is not in acute distress.    Appearance: She is well-developed.  Neck:     Thyroid: No thyromegaly.     Trachea: No tracheal deviation.     Comments: Thyroid non tender to palpation. No mass or goiter noted.  Cardiovascular:     Rate and Rhythm: Normal rate and regular rhythm.     Heart sounds: Normal heart sounds. No murmur heard. Pulmonary:     Effort: Pulmonary effort is normal.     Breath sounds: Normal breath sounds.  Chest:  Breasts:    Right: No swelling, inverted nipple, mass, skin change or tenderness.     Left: No swelling, inverted nipple, mass, skin change or tenderness.  Abdominal:     General: There is no distension.     Palpations: Abdomen is soft.     Tenderness: There is no abdominal tenderness.  Genitourinary:    Comments: External GU no rashes or lesions.  Vagina minimal amount of mucoid discharge.  Cervix normal in appearance.  Pap smear obtained.  No CMT.  Bimanual exam no tenderness or obvious masses. Musculoskeletal:     Cervical back: Normal range of motion and neck supple.  Lymphadenopathy:     Cervical: No cervical adenopathy.     Upper Body:     Right upper body: No supraclavicular, axillary or pectoral adenopathy.  Left upper body: No supraclavicular, axillary or pectoral adenopathy.  Skin:    General: Skin is warm and dry.     Findings: No rash.  Neurological:     Mental Status: She is alert and oriented to person, place, and time.  Psychiatric:        Mood and Affect: Mood normal.        Behavior: Behavior normal.        Thought Content: Thought content normal.        Judgment: Judgment normal.   Today's Vitals   07/13/22 0821  BP: 135/82  Pulse: 80  Temp: (!) 97.3 F (36.3 C)  SpO2: 96%  Weight: 98 lb (44.5 kg)  Height: $Remove'5\' 1"'pRLJnuv$  (1.549 m)   Body mass index is 18.52 kg/m. Recent Results (from the  past 2160 hour(s))  CBC with Differential     Status: Abnormal   Collection Time: 05/04/22  9:43 AM  Result Value Ref Range   WBC 6.4 3.4 - 10.8 x10E3/uL   RBC 5.31 (H) 3.77 - 5.28 x10E6/uL   Hemoglobin 15.2 11.1 - 15.9 g/dL   Hematocrit 46.5 34.0 - 46.6 %   MCV 88 79 - 97 fL   MCH 28.6 26.6 - 33.0 pg   MCHC 32.7 31.5 - 35.7 g/dL   RDW 13.7 11.7 - 15.4 %   Platelets 267 150 - 450 x10E3/uL   Neutrophils 68 Not Estab. %   Lymphs 24 Not Estab. %   Monocytes 5 Not Estab. %   Eos 2 Not Estab. %   Basos 1 Not Estab. %   Neutrophils Absolute 4.2 1.4 - 7.0 x10E3/uL   Lymphocytes Absolute 1.6 0.7 - 3.1 x10E3/uL   Monocytes Absolute 0.3 0.1 - 0.9 x10E3/uL   EOS (ABSOLUTE) 0.1 0.0 - 0.4 x10E3/uL   Basophils Absolute 0.1 0.0 - 0.2 x10E3/uL   Immature Granulocytes 0 Not Estab. %   Immature Grans (Abs) 0.0 0.0 - 0.1 x10E3/uL  Comprehensive Metabolic Panel (CMET)     Status: Abnormal   Collection Time: 05/04/22  9:43 AM  Result Value Ref Range   Glucose 102 (H) 70 - 99 mg/dL   BUN 10 6 - 24 mg/dL   Creatinine, Ser 0.76 0.57 - 1.00 mg/dL   eGFR 92 >59 mL/min/1.73   BUN/Creatinine Ratio 13 9 - 23   Sodium 140 134 - 144 mmol/L   Potassium 4.4 3.5 - 5.2 mmol/L   Chloride 103 96 - 106 mmol/L   CO2 25 20 - 29 mmol/L   Calcium 9.8 8.7 - 10.2 mg/dL   Total Protein 7.1 6.0 - 8.5 g/dL   Albumin 5.0 (H) 3.8 - 4.9 g/dL   Globulin, Total 2.1 1.5 - 4.5 g/dL   Albumin/Globulin Ratio 2.4 (H) 1.2 - 2.2   Bilirubin Total 0.3 0.0 - 1.2 mg/dL   Alkaline Phosphatase 88 44 - 121 IU/L   AST 13 0 - 40 IU/L   ALT 6 0 - 32 IU/L  Lipid Profile     Status: Abnormal   Collection Time: 05/04/22  9:43 AM  Result Value Ref Range   Cholesterol, Total 213 (H) 100 - 199 mg/dL   Triglycerides 170 (H) 0 - 149 mg/dL   HDL 45 >39 mg/dL   VLDL Cholesterol Cal 31 5 - 40 mg/dL   LDL Chol Calc (NIH) 137 (H) 0 - 99 mg/dL   Chol/HDL Ratio 4.7 (H) 0.0 - 4.4 ratio    Comment:  T. Chol/HDL  Ratio                                             Men  Women                               1/2 Avg.Risk  3.4    3.3                                   Avg.Risk  5.0    4.4                                2X Avg.Risk  9.6    7.1                                3X Avg.Risk 23.4   11.0   TSH     Status: None   Collection Time: 05/04/22  9:43 AM  Result Value Ref Range   TSH 0.810 0.450 - 4.500 uIU/mL  Magnesium     Status: None   Collection Time: 05/04/22  9:43 AM  Result Value Ref Range   Magnesium 2.3 1.6 - 2.3 mg/dL  B12     Status: None   Collection Time: 05/04/22  9:43 AM  Result Value Ref Range   Vitamin B-12 283 232 - 1,245 pg/mL   Tyrer Cusick lifetime breast cancer risk score 8.2%.      Assessment & Plan:   Problem List Items Addressed This Visit       Other   Dense breasts   Hair loss   Other Visit Diagnoses     Well woman exam    -  Primary   Relevant Orders   IGP, Aptima HPV   Screening for cervical cancer       Relevant Orders   IGP, Aptima HPV   Screening for HPV (human papillomavirus)       Relevant Orders   IGP, Aptima HPV   Encounter for screening mammogram for malignant neoplasm of breast       Relevant Orders   MM 3D SCREEN BREAST BILATERAL      Continue biotin for hair loss. Recommend at least 1 bottle of boost or high-protein supplement per day. Recommend vitamin D 2000 units/day. Recommend shingles vaccine and Tdap at local pharmacy. Return in about 1 year (around 07/14/2023) for physical. Call back sooner if no improvement in her hair loss.

## 2022-07-14 ENCOUNTER — Encounter: Payer: Self-pay | Admitting: Nurse Practitioner

## 2022-07-14 DIAGNOSIS — L659 Nonscarring hair loss, unspecified: Secondary | ICD-10-CM | POA: Insufficient documentation

## 2022-07-21 LAB — IGP, APTIMA HPV: HPV Aptima: POSITIVE — AB

## 2022-07-21 LAB — SPECIMEN STATUS REPORT

## 2022-07-27 ENCOUNTER — Other Ambulatory Visit: Payer: Self-pay | Admitting: Nurse Practitioner

## 2022-07-27 DIAGNOSIS — R87629 Unspecified abnormal cytological findings in specimens from vagina: Secondary | ICD-10-CM

## 2022-08-27 ENCOUNTER — Encounter: Payer: Self-pay | Admitting: *Deleted

## 2022-09-03 ENCOUNTER — Telehealth: Payer: Self-pay

## 2022-09-03 ENCOUNTER — Other Ambulatory Visit: Payer: Self-pay | Admitting: Gastroenterology

## 2022-09-03 DIAGNOSIS — K2100 Gastro-esophageal reflux disease with esophagitis, without bleeding: Secondary | ICD-10-CM

## 2022-09-03 MED ORDER — ESOMEPRAZOLE MAGNESIUM 40 MG PO CPDR: 40.0000 mg | DELAYED_RELEASE_CAPSULE | Freq: Two times a day (BID) | ORAL | 1 refills | Status: DC

## 2022-09-03 NOTE — Telephone Encounter (Signed)
Pt is needing a refill on Esomeprazole 40 mg qty:180 to be sent to [facility]. Pt was last seen on 03/19/2022.

## 2022-09-04 ENCOUNTER — Ambulatory Visit (INDEPENDENT_AMBULATORY_CARE_PROVIDER_SITE_OTHER): Payer: Managed Care, Other (non HMO) | Admitting: Nurse Practitioner

## 2022-09-04 ENCOUNTER — Encounter: Payer: Self-pay | Admitting: Nurse Practitioner

## 2022-09-04 VITALS — BP 123/81 | HR 73 | Temp 97.3°F | Ht 61.0 in | Wt 98.0 lb

## 2022-09-04 DIAGNOSIS — R300 Dysuria: Secondary | ICD-10-CM | POA: Diagnosis not present

## 2022-09-04 LAB — POCT URINALYSIS DIP (CLINITEK)
Bilirubin, UA: NEGATIVE
Glucose, UA: NEGATIVE mg/dL
Ketones, POC UA: NEGATIVE mg/dL
Nitrite, UA: NEGATIVE
POC PROTEIN,UA: 30 — AB
Spec Grav, UA: 1.02 (ref 1.010–1.025)
Urobilinogen, UA: 0.2 E.U./dL
pH, UA: 6 (ref 5.0–8.0)

## 2022-09-04 MED ORDER — NITROFURANTOIN MONOHYD MACRO 100 MG PO CAPS
100.0000 mg | ORAL_CAPSULE | Freq: Two times a day (BID) | ORAL | 0 refills | Status: AC
Start: 2022-09-04 — End: 2022-09-09

## 2022-09-04 NOTE — Progress Notes (Signed)
   Subjective:    Patient ID: Carla Wise, female    DOB: 11/05/1966, 56 y.o.   MRN: [id number]  HPI 56 year old female patient presents to clinic today with urinary frequency, urgency, dysuria, and blood on tissue since last night.  Patient denies any fevers, body aches, chills, nausea, vomiting, backaches.   Review of Systems  Genitourinary:  Positive for dysuria, frequency, hematuria and urgency.  All other systems reviewed and are negative.      Objective:   Physical Exam Vitals reviewed.  Constitutional:      General: She is not in acute distress.    Appearance: Normal appearance. She is normal weight. She is not ill-appearing, toxic-appearing or diaphoretic.  HENT:     Head: Normocephalic and atraumatic.  Cardiovascular:     Rate and Rhythm: Normal rate and regular rhythm.     Pulses: Normal pulses.     Heart sounds: Normal heart sounds. No murmur heard. Pulmonary:     Effort: Pulmonary effort is normal. No respiratory distress.     Breath sounds: Normal breath sounds. No wheezing.  Abdominal:     General: Abdomen is flat. Bowel sounds are normal. There is no distension.     Palpations: Abdomen is soft. There is no mass.     Tenderness: There is no abdominal tenderness. There is no right CVA tenderness, left CVA tenderness, guarding or rebound.     Hernia: No hernia is present.  Musculoskeletal:     Comments: Grossly intact  Skin:    General: Skin is warm.     Capillary Refill: Capillary refill takes less than 2 seconds.  Neurological:     Mental Status: She is alert.     Comments: Grossly intact  Psychiatric:        Mood and Affect: Mood normal.        Behavior: Behavior normal.           Assessment & Plan:   1. Dysuria -Suspect UTI -We will treat with Macrobid while culture pending - POCT URINALYSIS DIP (CLINITEK) - Urine Culture - nitrofurantoin, macrocrystal-monohydrate, (MACROBID) 100 MG capsule; Take 1 capsule (100 mg total) by mouth 2 (two)  times daily for 5 days.  Dispense: 10 capsule; Refill: 0 -Symptoms should improve within 2 to 3 days of antibiotic use.  Return to clinic if symptoms do not improve or worsen.    Note:  This document was prepared using Dragon voice recognition software and may include unintentional dictation errors. Note - This record has been created using [employer].  Chart creation errors have been sought, but may not always  have been located. Such creation errors do not reflect on  the standard of medical care.

## 2022-09-05 ENCOUNTER — Ambulatory Visit: Payer: Managed Care, Other (non HMO) | Admitting: Obstetrics & Gynecology

## 2022-09-05 ENCOUNTER — Other Ambulatory Visit (HOSPITAL_COMMUNITY)
Admission: RE | Admit: 2022-09-05 | Discharge: 2022-09-05 | Disposition: A | Discharge status: Home / Self Care | Payer: Managed Care, Other (non HMO) | Source: Ambulatory Visit | Attending: Obstetrics & Gynecology | Admitting: Obstetrics & Gynecology

## 2022-09-05 ENCOUNTER — Encounter: Payer: Self-pay | Admitting: Obstetrics & Gynecology

## 2022-09-05 VITALS — BP 106/75 | Ht 61.0 in | Wt 101.0 lb

## 2022-09-05 DIAGNOSIS — D060 Carcinoma in situ of endocervix: Secondary | ICD-10-CM

## 2022-09-05 DIAGNOSIS — R87619 Unspecified abnormal cytological findings in specimens from cervix uteri: Secondary | ICD-10-CM

## 2022-09-05 NOTE — Progress Notes (Unsigned)
Patient name: Carla Wise MRN [id number]  Date of birth: August 25, 1966 Chief Complaint:   Colposcopy  History of Present Illness:   Carla Wise is a 56 y.o. PM female being seen today for cervical dysplasia management.  Recently treated for UTI and noted to have hematuria- prior to this no bleeding.  Denies vaginal discharge, itching or irritation. Reports no acute gyn concerns.  Recent pap AGUS, HPV+  Smoker:  Yes.   New sexual partner:  No.   History of abnormal Pap: no  No LMP recorded. Patient has had an ablation.     09/05/2022    3:37 PM 07/13/2022    8:20 AM 05/04/2022    9:52 AM 04/24/2021    3:35 PM 01/23/2021    2:36 PM  Depression screen PHQ 2/9  Decreased Interest 2 0 2 0 0  Down, Depressed, Hopeless 0 0 0 0 0  PHQ - 2 Score 2 0 2 0 0  Altered sleeping 3  3    Tired, decreased energy 3  3    Change in appetite 1  0    Feeling bad or failure about yourself  0  0    Trouble concentrating 1  1    Moving slowly or fidgety/restless 0  0    Suicidal thoughts 0  0    PHQ-9 Score 10  9    Difficult doing work/chores   Not difficult at all       Review of Systems:   Pertinent items are noted in HPI Denies fever/chills, dizziness, headaches, visual disturbances, fatigue, shortness of breath, chest pain, abdominal pain, vomiting, no problems with bowel movements, or intercourse unless otherwise stated above.  Pertinent History Reviewed:  Reviewed past medical,surgical, social, obstetrical and family history.  Reviewed problem list, medications and allergies. Physical Assessment:   Vitals:   09/05/22 1531  BP: 106/75  Weight: 101 lb (45.8 kg)  Height: '5\' 1"'$  (1.549 m)  Body mass index is 19.08 kg/m.       Physical Examination:   General appearance: alert, well appearing, and in no distress  Psych: mood appropriate, normal affect  Skin: warm & dry   Cardiovascular: normal heart rate noted  Respiratory: normal respiratory effort, no distress  Abdomen:  soft, non-tender   Pelvic: VULVA: normal appearing vulva with no masses, tenderness or lesions, VAGINA: normal appearing vagina with normal color and discharge, no lesions, CERVIX: normal appearing cervix without discharge or lesions- see colposcopy section  UTERUS: uterus is normal size, shape, consistency and nontender, ADNEXA: normal adnexa in size, nontender and no masses  Extremities: no edema   Chaperone: Latisha Cresenzo     Colposcopy  and EMB Procedure Note  Indications: AGUS, HPV+    Procedure Details  The risks and benefits of the procedure and Written informed consent obtained.  Speculum placed in vagina and excellent visualization of cervix achieved, cervix swabbed x 3 with acetic acid solution.  Findings: Adequate colposcopy is noted today.  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  Cervix: no visible lesions; ECC and cervical biopsies obtained.    Following biopsies.  Cervix was grasped with single tooth tenaculum.  Os finder and small dilators were used to gain entry into the uterus.  EMB was completed with difficulty due to patient discomfort and return of minimal tissue.  Monsel's applied.  Adequate hemostasis noted  Specimens: ECC, cervical biopsies, endometrial biopsy obtained  Complications: none.  Colposcopic Impression: CIN 1   Plan(Based  on 2019 ASCCP recommendations)  -Discussed HPV- reviewed incidence and its potential to cause condylomas to dysplasia to cervical cancer -Reviewed degree of abnormal pap smears  -Discussed ASCCP guidelines and current recommendations for colposcopy along with EMB -As above, inform consent obtained and procedure completed -biopsies obtained, further management pending results -Questions and concerns were addressed  Janyth Pupa, DO Attending [place], [place] for [place]

## 2022-09-06 ENCOUNTER — Telehealth: Payer: Self-pay

## 2022-09-06 ENCOUNTER — Other Ambulatory Visit: Payer: Self-pay | Admitting: Nurse Practitioner

## 2022-09-06 DIAGNOSIS — N3000 Acute cystitis without hematuria: Secondary | ICD-10-CM

## 2022-09-06 DIAGNOSIS — R42 Dizziness and giddiness: Secondary | ICD-10-CM

## 2022-09-06 LAB — URINE CULTURE

## 2022-09-06 LAB — SPECIMEN STATUS REPORT

## 2022-09-06 MED ORDER — AMOXICILLIN 500 MG PO TABS: 500.0000 mg | ORAL_TABLET | Freq: Three times a day (TID) | ORAL | 0 refills | Status: AC

## 2022-09-06 MED ORDER — MECLIZINE HCL 25 MG PO TABS: 25.0000 mg | ORAL_TABLET | Freq: Two times a day (BID) | ORAL | 1 refills | Status: DC | PRN

## 2022-09-06 NOTE — Telephone Encounter (Signed)
Message Received: Today [name], Carla Gammon, NP  [name], RN  Meclizine sent to pharmacy. Please remind patient of anticholinergic side effects (mouth dryness, sedation, possible urinary retention, blurred vision, etc).   [name]

## 2022-09-06 NOTE — Telephone Encounter (Signed)
Caller name:Justin Montine Circle   On DPR? :Yes  Call back [phone]  Provider they see: Wolfgang Phoenix or [name]   Reason for call:P)t is calling crying she seen [place] Tuesday on UTI but she has vertigo bad and wants to see if something can be called   Pt uses [facility]

## 2022-09-06 NOTE — Telephone Encounter (Signed)
Patient advised per [name] NP: Meclizine sent to pharmacy. Please remind patient of anticholinergic side effects (mouth dryness, sedation, possible urinary retention, blurred vision, etc). Patient verbalized understanding.

## 2022-09-07 LAB — SURGICAL PATHOLOGY

## 2022-09-12 ENCOUNTER — Telehealth: Payer: Self-pay | Admitting: *Deleted

## 2022-09-12 NOTE — Telephone Encounter (Signed)
Called patient to make her aware that Dr Nelda Marseille would like to see her in the office to review recent results and discuss next steps. Pt verbalized understanding with no questions.  Appt made.

## 2022-09-19 ENCOUNTER — Encounter: Payer: Self-pay | Admitting: Obstetrics & Gynecology

## 2022-09-19 ENCOUNTER — Ambulatory Visit: Payer: Managed Care, Other (non HMO) | Admitting: Obstetrics & Gynecology

## 2022-09-19 VITALS — BP 124/83 | HR 89 | Ht 61.0 in | Wt 99.2 lb

## 2022-09-19 DIAGNOSIS — Z780 Asymptomatic menopausal state: Secondary | ICD-10-CM

## 2022-09-19 DIAGNOSIS — Z01818 Encounter for other preprocedural examination: Secondary | ICD-10-CM

## 2022-09-19 DIAGNOSIS — D069 Carcinoma in situ of cervix, unspecified: Secondary | ICD-10-CM

## 2022-09-19 DIAGNOSIS — R87619 Unspecified abnormal cytological findings in specimens from cervix uteri: Secondary | ICD-10-CM | POA: Diagnosis not present

## 2022-09-19 NOTE — Progress Notes (Signed)
   GYN VISIT Patient name: Carla Wise MRN [id number]  Date of birth: [birth year] Chief Complaint:   Follow-up (Discuss pathology results)  History of Present Illness:   Carla Wise is a 56 y.o. G1P1 PM female being seen today to review recent results.  She notes no changes since last visit.  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  No LMP recorded. Patient has had an ablation.     09/05/2022    3:37 PM 07/13/2022    8:20 AM 05/04/2022    9:52 AM 04/24/2021    3:35 PM 01/23/2021    2:36 PM  Depression screen PHQ 2/9  Decreased Interest 2 0 2 0 0  Down, Depressed, Hopeless 0 0 0 0 0  PHQ - 2 Score 2 0 2 0 0  Altered sleeping 3  3    Tired, decreased energy 3  3    Change in appetite 1  0    Feeling bad or failure about yourself  0  0    Trouble concentrating 1  1    Moving slowly or fidgety/restless 0  0    Suicidal thoughts 0  0    PHQ-9 Score 10  9    Difficult doing work/chores   Not difficult at all       Review of Systems:   Pertinent items are noted in HPI Denies fever/chills, dizziness, headaches, visual disturbances, fatigue, shortness of breath, chest pain, abdominal pain, vomiting, no problems, bowel movements, urination, or intercourse unless otherwise stated above.  Pertinent History Reviewed:  Reviewed past medical,surgical, social, obstetrical and family history.  Reviewed problem list, medications and allergies. Physical Assessment:   Vitals:   09/19/22 1608  BP: 124/83  Pulse: 89  Weight: 99 lb 4 oz (45 kg)  Height: '5\' 1"'$  (1.549 m)  Body mass index is 18.75 kg/m.       Physical Examination:   General appearance: alert, well appearing, and in no distress  Psych: mood appropriate, normal affect  Skin: warm & dry   Cardiovascular: normal heart rate noted  Respiratory: normal respiratory effort, no distress  Abdomen: soft, non-tender   Pelvic: normal external genitalia, vulva, vagina,  cervix, uterus and adnexa- atrophic changes noted.  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  Reviewed expected recovery -reviewed pros/cons of ovarian preservation and discussed recent study suggests benefit of ovarian preservation declines @ 55yo -recommend LAVH, BSO if [place] confirms normal-sized uterus '[]'$  plan for pelvic US at next available pt agreeable and will schedule at next available -preop labs ordered  -surgical referral created   Orders Placed This Encounter  Procedures   US PELVIS TRANSVAGINAL NON-OB (TV ONLY)    Return for pelvic US at [facility].   Janyth Pupa, DO Attending [place], [facility] for [employer], [place]

## 2022-09-20 ENCOUNTER — Encounter: Payer: Self-pay | Admitting: Obstetrics & Gynecology

## 2022-09-20 NOTE — Addendum Note (Signed)
Addended by: Annalee Genta on: 09/20/2022 08:50 AM   Modules accepted: Orders

## 2022-09-23 ENCOUNTER — Ambulatory Visit (HOSPITAL_BASED_OUTPATIENT_CLINIC_OR_DEPARTMENT_OTHER)
Admission: RE | Admit: 2022-09-23 | Discharge: 2022-09-23 | Disposition: A | Discharge status: Home / Self Care | Payer: Managed Care, Other (non HMO) | Source: Ambulatory Visit | Attending: Obstetrics & Gynecology | Admitting: Obstetrics & Gynecology

## 2022-09-23 DIAGNOSIS — Z01818 Encounter for other preprocedural examination: Secondary | ICD-10-CM | POA: Diagnosis present

## 2022-09-23 DIAGNOSIS — D069 Carcinoma in situ of cervix, unspecified: Secondary | ICD-10-CM | POA: Diagnosis not present

## 2022-09-23 DIAGNOSIS — Z780 Asymptomatic menopausal state: Secondary | ICD-10-CM | POA: Insufficient documentation

## 2022-09-26 ENCOUNTER — Telehealth: Payer: Self-pay | Admitting: Obstetrics & Gynecology

## 2022-09-26 NOTE — Telephone Encounter (Signed)
Called patient back and read her Dr. Annie Main note on the ultrasound report. No other questions at this time.

## 2022-09-26 NOTE — Telephone Encounter (Signed)
Pt is asking for a call about her [place] results.

## 2022-10-01 ENCOUNTER — Encounter: Payer: Self-pay | Admitting: Gastroenterology

## 2022-10-01 ENCOUNTER — Ambulatory Visit (INDEPENDENT_AMBULATORY_CARE_PROVIDER_SITE_OTHER): Payer: Managed Care, Other (non HMO) | Admitting: Gastroenterology

## 2022-10-01 VITALS — BP 106/68 | HR 105 | Temp 98.4°F | Ht 62.5 in | Wt 99.8 lb

## 2022-10-01 DIAGNOSIS — K219 Gastro-esophageal reflux disease without esophagitis: Secondary | ICD-10-CM | POA: Diagnosis not present

## 2022-10-01 DIAGNOSIS — K5900 Constipation, unspecified: Secondary | ICD-10-CM | POA: Diagnosis not present

## 2022-10-01 DIAGNOSIS — K2100 Gastro-esophageal reflux disease with esophagitis, without bleeding: Secondary | ICD-10-CM | POA: Diagnosis not present

## 2022-10-01 MED ORDER — LINACLOTIDE 145 MCG PO CAPS: 145.0000 ug | ORAL_CAPSULE | Freq: Every day | ORAL | 11 refills | Status: DC

## 2022-10-01 MED ORDER — ESOMEPRAZOLE MAGNESIUM 40 MG PO CPDR: DELAYED_RELEASE_CAPSULE | ORAL | 3 refills | Status: DC

## 2022-10-01 NOTE — Patient Instructions (Signed)
Continue esomeprazole '40mg'$  once to twice daily before a meal for acid reflux. Continue Linzess 111mg daily before breakfast for constipation. Return to the office in one year or sooner if needed.

## 2022-10-01 NOTE — Progress Notes (Signed)
GI Office Note    Referring Provider: Kathyrn Drown, MD Primary Care Physician:  Kathyrn Drown, MD  Primary Gastroenterologist: Elon Alas. Abbey Chatters, DO   Chief Complaint   Chief Complaint  Patient presents with   Follow-up    Doing well. Needs refills on Linzess.    History of Present Illness   Carla Wise is a 56 y.o. female presenting today for follow-up.  Last seen in the office in April 2023.  She has a history of Barrett's esophagus, constipation, gastric adenoma (2018), GERD.  Last EGD/colonoscopy 03/2021 as outlined below. Clinically doing well from a GI standpoint. Heartburn well controlled. Typically takes Nexium once daily sometimes twice. No dysphagia. No abdominal pain. BMs doing well on Linzess daily as needed. No melena, brbpr. Previously did not find Trulance, miralax, or benefiber helpful.      EGD 03/2021: -Mild Schatzki ring. Dilated. Biopsied. 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 -Gastritis. Biopsied. Unremarkable. No h.pylori -Normal duodenal bulb, first portion of the duodenum and second portion of the duodenum.  Colonoscopy 03/2021: -Non-bleeding internal hemorrhoids. -Diverticulosis in the sigmoid colon. -One 11 mm polyp at the hepatic flexure, removed with mucosal resection. Resected and retrieved. Sessile serrate polyp without cytologic dysplasia -One 4 mm polyp in the transverse colon, removed with a cold snare. Resected and retrieved. Sessile serrate polyps without cytologic dysplasia -The examination was otherwise normal. -Mucosal resection was performed. Resection and retrieval were complete.  Previous work up with Dr. 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 No H. Pylori on pathology.  Short segment  nondysplastic Barrett's Esophagus on biopsy. Needs repeat EGD in 1 year.  Manometry 07/27/20: Normal.  Ph/impeedance 07/28/20: No evidence of pathologic gastroesophageal reflux.  Reflux symptoms slightly elevated, predominantly weekly.  Increase weekly acid reflux symptoms could be secondary to supragastric belching. Recommended trial of baclofen.  Follow-up telemedicine visit 08/16/20. Patient preferred medication management rather than antireflux surgery. She was started on baclofen 5 mg. Stated she can titrate up to TID as tolerated and depending on efficacy. Resume omeprazole. Follow-up prn.    Medications   Current Outpatient Medications  Medication Sig Dispense Refill   esomeprazole (NEXIUM) 40 MG capsule Take 1 capsule (40 mg total) by mouth 2 (two) times daily before a meal. 180 capsule 1   hydrocortisone (ANUSOL-HC) 2.5 % rectal cream APPLY A SMALL PEA SIZED AMOUNT PER YOUR RECTUM 2 TO 4 TIMES DAILY FOR 10 TO 14 DAYS. 30 g 0   hydrOXYzine (ATARAX) 25 MG tablet Take 1/2-1 tab po qhs prn sleep 30 tablet 0   ibuprofen (ADVIL) 800 MG tablet Take 1 tablet (800 mg total) by mouth every 8 (eight) hours as needed. 90 tablet 1   lidocaine (XYLOCAINE) 5 % ointment APPLY A SMALL PEA SIZED AMOUNT PER RECTUM TWICE DAILY FOR RECTAL PAIN RELATED TO HEMMORRHOIDS. 35.44 g 0   linaclotide (LINZESS) 145 MCG CAPS capsule Take 1 capsule (145 mcg total) by mouth daily before breakfast. 30 capsule 1   meclizine (ANTIVERT) 25 MG tablet Take 1 tablet (25 mg total) by mouth 2 (two) times daily as needed for dizziness or nausea. 30 tablet 1   XIIDRA 5 % SOLN Apply 1 drop to eye 2 (two) times daily.     zolpidem (AMBIEN) 5 MG tablet TAKE ONE TABLET BY  MOUTH AT BEDTIME AS NEEDED FOR SLEEP 30 tablet 2   No current facility-administered medications for this visit.    Allergies   Allergies as of 10/01/2022 - Review Complete 10/01/2022  Allergen Reaction Noted   Azithromycin  11/23/2013   Levaquin [levofloxacin in  d5w]  02/04/2015      Review of Systems   General: Negative for anorexia, weight loss, fever, chills, fatigue, weakness. ENT: Negative for hoarseness, difficulty swallowing , nasal congestion. CV: Negative for chest pain, angina, palpitations, dyspnea on exertion, peripheral edema.  Respiratory: Negative for dyspnea at rest, dyspnea on exertion, cough, sputum, wheezing.  GI: See history of present illness. GU:  Negative for dysuria, hematuria, urinary incontinence, urinary frequency, nocturnal urination.  Endo: Negative for unusual weight change.     Physical Exam   BP 106/68 (BP Location: Left Arm, Patient Position: Sitting, Cuff Size: Normal)   Pulse (!) 105   Temp 98.4 F (36.9 C) (Oral)   Ht 5' 2.5" (1.588 m)   Wt 99 lb 12.8 oz (45.3 kg)   LMP  (LMP Unknown)   SpO2 95%   BMI 17.96 kg/m    General: Well-nourished, well-developed in no acute distress.  Eyes: No icterus. Mouth: Oropharyngeal mucosa moist and pink , no lesions erythema or exudate. Lungs: Clear to auscultation bilaterally.  Heart: Regular rate and rhythm, no murmurs rubs or gallops.  Abdomen: Bowel sounds are normal, nontender, nondistended, no hepatosplenomegaly or masses,  no abdominal bruits or hernia , no rebound or guarding.  Rectal: not performed Extremities: No lower extremity edema. No clubbing or deformities. Neuro: Alert and oriented x 4   Skin: Warm and dry, no jaundice.   Psych: Alert and cooperative, normal mood and affect.  Labs   Lab Results  Component Value Date   CREATININE 0.76 05/04/2022   BUN 10 05/04/2022   NA 140 05/04/2022   K 4.4 05/04/2022   CL 103 05/04/2022   CO2 25 05/04/2022   Lab Results  Component Value Date   ALT 6 05/04/2022   AST 13 05/04/2022   ALKPHOS 88 05/04/2022   BILITOT 0.3 05/04/2022   Lab Results  Component Value Date   WBC 6.4 05/04/2022   HGB 15.2 05/04/2022   HCT 46.5 05/04/2022   MCV 88 05/04/2022   PLT 267 05/04/2022    Imaging Studies    US PELVIC COMPLETE WITH TRANSVAGINAL  Result Date: 09/24/2022 CLINICAL DATA:  Preoperative exam.  History of cervical cancer. EXAM: TRANSABDOMINAL AND TRANSVAGINAL ULTRASOUND OF PELVIS TECHNIQUE: Both transabdominal and transvaginal ultrasound examinations of the pelvis were performed. Transabdominal technique was performed for global imaging of the pelvis including uterus, ovaries, adnexal regions, and pelvic cul-de-sac. It was necessary to proceed with endovaginal exam following the transabdominal exam to visualize the endometrium and adnexal structures. COMPARISON:  None Available. FINDINGS: Uterus Measurements: 5.2 x 1.8 x 2.5 cm = volume: 12.3 mL. No fibroids or other mass visualized. Probable nabothian cyst. Endometrium Thickness: 2 mm.  Small amount of fluid in the endometrial canal. Right ovary Not visualized Left ovary Not visualized Other findings Trace fluid in the pelvis. IMPRESSION: 1. No acute process within the pelvis. 2. The ovaries are not visualized. Electronically Signed   By: Lovey Newcomer M.D.   On: 09/24/2022 06:25    Assessment/Plan:   GERD/Barrett's: Barrett's diagnosed at time of EGD in 05/2020. Not seen on EGD 03/2021. Clinically GERD controlled on Nexium '40mg'$  once to twice daily. Typically gets by with once daily dosing  and encouraged lowest effective dose. To discuss needs for any future surveillance EGD with Dr. Abbey Chatters, given previously diagnosed Barrett's. She requested refills to go to [facility] order due to expense. Reinforced antireflux measures. Return OV in one year.   Constipation: doing well on Linzess. Continue Linzess 162mg daily. Return OV in one year.      LLaureen Ochs LBobby Rumpf MSt[facility]

## 2022-10-02 DIAGNOSIS — Z029 Encounter for administrative examinations, unspecified: Secondary | ICD-10-CM

## 2022-10-15 ENCOUNTER — Encounter: Payer: Self-pay | Admitting: Obstetrics & Gynecology

## 2022-10-29 ENCOUNTER — Ambulatory Visit (INDEPENDENT_AMBULATORY_CARE_PROVIDER_SITE_OTHER): Payer: Managed Care, Other (non HMO) | Admitting: Family Medicine

## 2022-10-29 VITALS — BP 122/82 | Temp 98.1°F | Ht 61.0 in | Wt 100.0 lb

## 2022-10-29 DIAGNOSIS — N3000 Acute cystitis without hematuria: Secondary | ICD-10-CM

## 2022-10-29 DIAGNOSIS — N390 Urinary tract infection, site not specified: Secondary | ICD-10-CM | POA: Insufficient documentation

## 2022-10-29 MED ORDER — AMOXICILLIN-POT CLAVULANATE 875-125 MG PO TABS: 1.0000 | ORAL_TABLET | Freq: Two times a day (BID) | ORAL | 0 refills | Status: DC

## 2022-10-29 NOTE — Assessment & Plan Note (Signed)
Placing empirically on Augmentin.  This is based on prior culture.  Advised to stop Macrobid.  Augmentin will also cover up respiratory symptoms if they are bacterial in origin although I believe that this is viral.

## 2022-10-29 NOTE — Progress Notes (Signed)
Subjective:  Patient ID: Carla Wise, female    DOB: [birth year]  Age: 56 y.o. MRN: [id number]  CC: Chief Complaint  Patient presents with   Ear Pain    Left ear pain- sinus burning and drainage Patient states she has UTI and started previous antibiotics for last 3 days    HPI:  56 year old female presents for evaluation of the above.  Patient reports that she has had cough and bilateral ear pain since Friday.  No fever.  Patient states that she seems to get something like this every year about this time.  She is concerned about her symptoms as she has upcoming surgery.  Patient also reports that since Friday she has had pressure when she urinates and associated pain.  She states that she has been taking Macrobid for suspected UTI.  Patient Active Problem List   Diagnosis Date Noted   UTI (urinary tract infection) 10/29/2022   Dense breasts 07/13/2022   External hemorrhoid 01/23/2021   Barrett's esophagus 10/10/2020   Papanicolaou smear of cervix with positive 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 virus (HPV) test 09/23/2020   Insomnia 09/16/2020   Hyperlipidemia 09/16/2020   History of gastric polyp 01/28/2019   Gastroesophageal reflux disease 12/30/2018   Anxiety 06/18/2018   Adhesive capsulitis of left shoulder    Dyspepsia and disorder of function of stomach    Constipation 02/15/2017   Colon cancer screening 02/15/2017   Arthritis of knee, degenerative 03/09/2014   Medial meniscus, posterior horn derangement 08/02/2011    Social Hx   Social History   Socioeconomic History   Marital status: Married    Spouse name: Not on file   Number of children: Not on file   Years of education: 12th grade   Highest education level: Not on file  Occupational History   Occupation: [employer]    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   Tobacco Use   Smoking status: Every Day    Packs/day: 0.50    Years: 25.00    Total pack years: 12.50    Types: Cigarettes   Smokeless tobacco: Never   Tobacco  comments:    Smokes 5 cigs daily  Vaping Use   Vaping Use: Never used  Substance and Sexual Activity   Alcohol use: No   Drug use: No   Sexual activity: Yes    Birth control/protection: Surgical  Other Topics Concern   Not on file  Social History Narrative   Not on file   Social Determinants of Health   Financial Resource Strain: Low Risk  (09/05/2022)   Overall Financial Resource Strain (CARDIA)    Difficulty of Paying Living Expenses: Not hard at all  Food Insecurity: No Food Insecurity (09/05/2022)   Hunger Vital Sign    Worried About Running Out of Food in the Last Year: Never true    Ran Out of Food in the Last Year: Never true  Transportation Needs: No Transportation Needs (09/05/2022)   PRAPARE - Hydrologist (Medical): No    Lack of Transportation (Non-Medical): No  Physical Activity: Sufficiently Active (09/05/2022)   Exercise Vital Sign    Days of Exercise per Week: 7 days    Minutes of Exercise per Session: 100 min  Stress: Unknown (09/05/2022)   [place]    Feeling of Stress : Patient refused  Social Connections: Moderately Integrated (09/05/2022)   Social Connection and Isolation Panel [NHANES]    Frequency of Communication with  Friends and Family: More than three times a week    Frequency of Social Gatherings with Friends and Family: Once a week    Attends Religious Services: More than 4 times per year    Active Member of [employer] or Organizations: No    Attends Music therapist: Never    Marital Status: Married    Review of Systems Per HPI  Objective:  BP 122/82   Temp 98.1 F (36.7 C) (Oral)   Ht '5\' 1"'$  (1.549 m)   Wt 100 lb (45.4 kg)   LMP  (LMP Unknown)   BMI 18.89 kg/m      10/29/2022    1:15 PM 10/01/2022    3:53 PM 09/19/2022    4:08 PM  BP/Weight  Systolic BP 235 573 220  Diastolic BP 82 68 83  Wt. (Lbs) 100 99.8 99.25  BMI 18.89  kg/m2 17.96 kg/m2 18.75 kg/m2    Physical Exam Vitals and nursing note reviewed.  Constitutional:      General: She is not in acute distress.    Appearance: Normal appearance.  HENT:     Head: Normocephalic and atraumatic.     Right Ear: Tympanic membrane normal.     Left Ear: Tympanic membrane normal.     Mouth/Throat:     Pharynx: Posterior oropharyngeal erythema present. No oropharyngeal exudate.  Eyes:     General:        Right eye: No discharge.        Left eye: No discharge.     Conjunctiva/sclera: Conjunctivae normal.  Cardiovascular:     Rate and Rhythm: Normal rate and regular rhythm.  Pulmonary:     Effort: Pulmonary effort is normal.     Breath sounds: Normal breath sounds. No wheezing or rales.  Abdominal:     General: There is no distension.     Palpations: Abdomen is soft.     Tenderness: There is no abdominal tenderness.  Neurological:     Mental Status: She is alert.  Psychiatric:        Mood and Affect: Mood normal.        Behavior: Behavior normal.     Lab Results  Component Value Date   WBC 6.4 05/04/2022   HGB 15.2 05/04/2022   HCT 46.5 05/04/2022   PLT 267 05/04/2022   GLUCOSE 102 (H) 05/04/2022   CHOL 213 (H) 05/04/2022   TRIG 170 (H) 05/04/2022   HDL 45 05/04/2022   LDLCALC 137 (H) 05/04/2022   ALT 6 05/04/2022   AST 13 05/04/2022   NA 140 05/04/2022   K 4.4 05/04/2022   CL 103 05/04/2022   CREATININE 0.76 05/04/2022   BUN 10 05/04/2022   CO2 25 05/04/2022   TSH 0.810 05/04/2022     Assessment & Plan:   Problem List Items Addressed This Visit       Genitourinary   UTI (urinary tract infection) - Primary    Placing empirically on Augmentin.  This is based on prior culture.  Advised to stop Macrobid.  Augmentin will also cover up respiratory symptoms if they are bacterial in origin although I believe that this is viral.       Meds ordered this encounter  Medications   amoxicillin-clavulanate (AUGMENTIN) 875-125 MG tablet     Sig: Take 1 tablet by mouth 2 (two) times daily.    Dispense:  14 tablet    Refill:  0    Follow-up:  Return if symptoms worsen  or fail to improve.  [place]

## 2022-10-29 NOTE — Patient Instructions (Signed)
Stop the Macrobid.  Augmentin as prescribed.  Take care  Dr. Lacinda Axon

## 2022-11-09 NOTE — Patient Instructions (Signed)
OLITA TAKESHITA  11/09/2022     '@[email or web address]'$ @   Your procedure is scheduled on  11/14/2022.   Report to [facility] at  0600  A.M.   Call this number if you have problems the morning of surgery:  [phone]  If you experience any cold or flu symptoms such as cough, fever, chills, shortness of breath, etc. between now and your scheduled surgery, please notify us at the above number.   Remember:  Do not eat or drink after midnight.      Take these medicines the morning of surgery with A SIP OF WATER                      esomeprazole, antivert(if needed).     Do not wear jewelry, make-up or nail polish.  Do not wear lotions, powders, or perfumes, or deodorant.  Do not shave 48 hours prior to surgery.  Men may shave face and neck.  Do not bring valuables to the hospital.  [facility] is not responsible for any belongings or valuables.  Contacts, dentures or bridgework may not be worn into surgery.  Leave your suitcase in the car.  After surgery it may be brought to your room.  For patients admitted to the hospital, discharge time will be determined by your treatment team.  Patients discharged the day of surgery will not be allowed to drive home and must have someone with them for 24 hours.    Special instructions:   DO NOT smoke tobacco or vape for 24 hours before your procedure.  Please read over the following fact sheets that you were given. Pain Booklet, Coughing and Deep Breathing, Blood Transfusion Information, Surgical Site Infection Prevention, Anesthesia Post-op Instructions, and Care and Recovery After Surgery      Laparoscopically Assisted Vaginal Hysterectomy, Care After The following information offers guidance on how to care for yourself after your procedure. Your health care provider may also give you more specific instructions. If you have problems or questions, contact your health care provider. What can I expect after the  procedure? After the procedure, it is common to have: Soreness and numbness in your incision areas. Abdominal pain. You will be given pain medicine to control it. Vaginal bleeding and discharge. You will need to use a sanitary pad after this procedure. Tiredness (fatigue). Poor appetite. Less interest in sex. Feelings of sadness or other emotions. If your ovaries were also removed, it is common to have symptoms of menopause, such as hot flashes, night sweats, and lack of sleep (insomnia). Follow these instructions at home: Medicines Take over-the-counter and prescription medicines only as told by your health care provider. Ask your health care provider if the medicine prescribed to you: Requires you to avoid driving or using heavy machinery. Can cause constipation. You may need to take these actions to prevent or treat constipation: Drink enough fluid to keep your urine pale yellow. Take over-the-counter or prescription medicines. Eat foods that are high in fiber, such as beans, whole grains, and fresh fruits and vegetables. Limit foods that are high in fat and processed sugars, such as fried or sweet foods. Incision care  Follow instructions from your health care provider about how to take care of your incisions. Make sure you: Wash your hands with soap and water for at least 20 seconds before and after you change your bandage (dressing). If soap and water are not available, use hand sanitizer.  Change your dressing as told by your health care provider. Leave stitches (sutures), skin glue, or adhesive strips in place. These skin closures may need to stay in place for 2 weeks or longer. If adhesive strip edges start to loosen and curl up, you may trim the loose edges. Do not remove adhesive strips completely unless your health care provider tells you to do that. Check your incision areas every day for signs of infection. Check for: More redness, swelling, or pain. Fluid or  blood. Warmth. Pus or a bad smell. Activity  Rest as told by your healthcare provider. Return to your normal activities as told by your health care provider. Ask your health care provider what activities are safe for you. Avoid sitting for a long time without moving. Get up to take short walks every 1-2 hours. This is important to improve blood flow and breathing. Ask for help if you feel weak or unsteady. Do not lift anything that is heavier than 10 lb (4.5 kg), or the limit that you are told, until your health care provider says that it is safe. If you were given a sedative during the procedure, it can affect you for several hours. Do not drive or operate machinery until your health care provider says that it is safe. Lifestyle Do not use any products that contain nicotine or tobacco. These products include cigarettes, chewing tobacco, and vaping devices, such as e-cigarettes. These can delay healing after surgery. If you need help quitting, ask your health care provider. Do not drink alcohol until your health care provider approves. General instructions  Do not douche, use tampons, or have sex for at least 6 weeks, or as told by your health care provider. If you struggle with physical or emotional changes after your procedure, speak with your health care provider or a therapist. Do not take baths, swim, or use a hot tub until your health care provider approves. You may only be allowed to take showers for 2-3 weeks. Keep your dressing dry until your health care provider says it can be removed. Try to have someone at home with you for the first 1-2 weeks to help with your daily chores. Wear compression stockings as told by your health care provider. These stockings help to prevent blood clots and reduce swelling in your legs. Keep all follow-up visits. This is important. Contact a health care provider if: You have any of these signs of infection: More redness, swelling, warmth, or pain  around an incision. Fluid or blood coming from an incision. Pus or a bad smell coming from an incision. Chills or a fever. An incision opens. Your pain medicine is not helping. You feel dizzy or light-headed. You have pain or bleeding when you urinate. You have nausea and vomiting that does not go away. You have pus, or a bad-smelling discharge coming from your vagina. Get help right away if: You have a fever and your symptoms suddenly get worse. You have severe abdominal pain. You have chest pain. You have shortness of breath. You faint. You have pain, swelling, or redness in your leg. You have heavy vaginal bleeding and blood clots, soaking through a sanitary pad in less than 1 hour. These symptoms may represent a serious problem that is an emergency. Do not wait to see if the symptoms will go away. Get medical help right away. Call your local emergency services (911 in the [place] Do not drive yourself to the hospital. Summary After the procedure, it is common to  have abdominal pain and vaginal bleeding. Wear a sanitary pad for vaginal discharge or bleeding. You should not drive or lift heavy objects until your health care provider says that it is safe. Contact your health care provider if you have any symptoms of infection, heavy vaginal bleeding, nausea, vomiting, or shortness of breath. This information is not intended to replace advice given to you by your health care provider. Make sure you discuss any questions you have with your health care provider. Document Revised: 02/07/2022 Document Reviewed: 07/29/2020 Elsevier Patient Education  [place] Anesthesia, Adult, Care After The following information offers guidance on how to care for yourself after your procedure. Your health care provider may also give you more specific instructions. If you have problems or questions, contact your health care provider. What can I expect after the procedure? After the  procedure, it is common for people to: Have pain or discomfort at the IV site. Have nausea or vomiting. Have a sore throat or hoarseness. Have trouble concentrating. Feel cold or chills. Feel weak, sleepy, or tired (fatigue). Have soreness and body aches. These can affect parts of the body that were not involved in surgery. Follow these instructions at home: For the time period you were told by your health care provider:  Rest. Do not participate in activities where you could fall or become injured. Do not drive or use machinery. Do not drink alcohol. Do not take sleeping pills or medicines that cause drowsiness. Do not make important decisions or sign legal documents. Do not take care of children on your own. General instructions Drink enough fluid to keep your urine pale yellow. If you have sleep apnea, surgery and certain medicines can increase your risk for breathing problems. Follow instructions from your health care provider about wearing your sleep device: Anytime you are sleeping, including during daytime naps. While taking prescription pain medicines, sleeping medicines, or medicines that make you drowsy. Return to your normal activities as told by your health care provider. Ask your health care provider what activities are safe for you. Take over-the-counter and prescription medicines only as told by your health care provider. Do not use any products that contain nicotine or tobacco. These products include cigarettes, chewing tobacco, and vaping devices, such as e-cigarettes. These can delay incision healing after surgery. If you need help quitting, ask your health care provider. Contact a health care provider if: You have nausea or vomiting that does not get better with medicine. You vomit every time you eat or drink. You have pain that does not get better with medicine. You cannot urinate or have bloody urine. You develop a skin rash. You have a fever. Get help right away  if: You have trouble breathing. You have chest pain. You vomit blood. These symptoms may be an emergency. Get help right away. Call 911. Do not wait to see if the symptoms will go away. Do not drive yourself to the hospital. Summary After the procedure, it is common to have a sore throat, hoarseness, nausea, vomiting, or to feel weak, sleepy, or fatigue. For the time period you were told by your health care provider, do not drive or use machinery. Get help right away if you have difficulty breathing, have chest pain, or vomit blood. These symptoms may be an emergency. This information is not intended to replace advice given to you by your health care provider. Make sure you discuss any questions you have with your health care provider. Document Revised: 02/23/2022 Document  Reviewed: 02/23/2022 Elsevier Patient Education  [place]. How to Use Chlorhexidine Before Surgery Chlorhexidine gluconate (CHG) is a germ-killing (antiseptic) solution that is used to clean the skin. It can get rid of the bacteria that normally live on the skin and can keep them away for about 24 hours. To clean your skin with CHG, you may be given: A CHG solution to use in the shower or as part of a sponge bath. A prepackaged cloth that contains CHG. Cleaning your skin with CHG may help lower the risk for infection: While you are staying in the intensive care unit of the hospital. If you have a vascular access, such as a central line, to provide short-term or long-term access to your veins. If you have a catheter to drain urine from your bladder. If you are on a ventilator. A ventilator is a machine that helps you breathe by moving air in and out of your lungs. After surgery. What are the risks? Risks of using CHG include: A skin reaction. Hearing loss, if CHG gets in your ears and you have a perforated eardrum. Eye injury, if CHG gets in your eyes and is not rinsed out. The CHG product catching fire. Make  sure that you avoid smoking and flames after applying CHG to your skin. Do not use CHG: If you have a chlorhexidine allergy or have previously reacted to chlorhexidine. On babies younger than 35 months of age. How to use CHG solution Use CHG only as told by your health care provider, and follow the instructions on the label. Use the full amount of CHG as directed. Usually, this is one bottle. During a shower Follow these steps when using CHG solution during a shower (unless your health care provider gives you different instructions): Start the shower. Use your normal soap and shampoo to wash your face and hair. Turn off the shower or move out of the shower stream. Pour the CHG onto a clean washcloth. Do not use any type of brush or rough-edged sponge. Starting at your neck, lather your body down to your toes. Make sure you follow these instructions: If you will be having surgery, pay special attention to the part of your body where you will be having surgery. Scrub this area for at least 1 minute. Do not use CHG on your head or face. If the solution gets into your ears or eyes, rinse them well with water. Avoid your genital area. Avoid any areas of skin that have broken skin, cuts, or scrapes. Scrub your back and under your arms. Make sure to wash skin folds. Let the lather sit on your skin for 1-2 minutes or as long as told by your health care provider. Thoroughly rinse your entire body in the shower. Make sure that all body creases and crevices are rinsed well. Dry off with a clean towel. Do not put any substances on your body afterward--such as powder, lotion, or perfume--unless you are told to do so by your health care provider. Only use lotions that are recommended by the manufacturer. Put on clean clothes or pajamas. If it is the night before your surgery, sleep in clean sheets.  During a sponge bath Follow these steps when using CHG solution during a sponge bath (unless your health  care provider gives you different instructions): Use your normal soap and shampoo to wash your face and hair. Pour the CHG onto a clean washcloth. Starting at your neck, lather your body down to your toes. Make sure  you follow these instructions: If you will be having surgery, pay special attention to the part of your body where you will be having surgery. Scrub this area for at least 1 minute. Do not use CHG on your head or face. If the solution gets into your ears or eyes, rinse them well with water. Avoid your genital area. Avoid any areas of skin that have broken skin, cuts, or scrapes. Scrub your back and under your arms. Make sure to wash skin folds. Let the lather sit on your skin for 1-2 minutes or as long as told by your health care provider. Using a different clean, wet washcloth, thoroughly rinse your entire body. Make sure that all body creases and crevices are rinsed well. Dry off with a clean towel. Do not put any substances on your body afterward--such as powder, lotion, or perfume--unless you are told to do so by your health care provider. Only use lotions that are recommended by the manufacturer. Put on clean clothes or pajamas. If it is the night before your surgery, sleep in clean sheets. How to use CHG prepackaged cloths Only use CHG cloths as told by your health care provider, and follow the instructions on the label. Use the CHG cloth on clean, dry skin. Do not use the CHG cloth on your head or face unless your health care provider tells you to. When washing with the CHG cloth: Avoid your genital area. Avoid any areas of skin that have broken skin, cuts, or scrapes. Before surgery Follow these steps when using a CHG cloth to clean before surgery (unless your health care provider gives you different instructions): Using the CHG cloth, vigorously scrub the part of your body where you will be having surgery. Scrub using a back-and-forth motion for 3 minutes. The area on your  body should be completely wet with CHG when you are done scrubbing. Do not rinse. Discard the cloth and let the area air-dry. Do not put any substances on the area afterward, such as powder, lotion, or perfume. Put on clean clothes or pajamas. If it is the night before your surgery, sleep in clean sheets.  For general bathing Follow these steps when using CHG cloths for general bathing (unless your health care provider gives you different instructions). Use a separate CHG cloth for each area of your body. Make sure you wash between any folds of skin and between your fingers and toes. Wash your body in the following order, switching to a new cloth after each step: The front of your neck, shoulders, and chest. Both of your arms, under your arms, and your hands. Your stomach and groin area, avoiding the genitals. Your right leg and foot. Your left leg and foot. The back of your neck, your back, and your buttocks. Do not rinse. Discard the cloth and let the area air-dry. Do not put any substances on your body afterward--such as powder, lotion, or perfume--unless you are told to do so by your health care provider. Only use lotions that are recommended by the manufacturer. Put on clean clothes or pajamas. Contact a health care provider if: Your skin gets irritated after scrubbing. You have questions about using your solution or cloth. You swallow any chlorhexidine. Call your local poison control center (1-9564575294 in the [place] Get help right away if: Your eyes itch badly, or they become very red or swollen. Your skin itches badly and is red or swollen. Your hearing changes. You have trouble seeing. You have swelling or tingling  in your mouth or throat. You have trouble breathing. These symptoms may represent a serious problem that is an emergency. Do not wait to see if the symptoms will go away. Get medical help right away. Call your local emergency services (911 in the [place] Do not drive  yourself to the hospital. Summary Chlorhexidine gluconate (CHG) is a germ-killing (antiseptic) solution that is used to clean the skin. Cleaning your skin with CHG may help to lower your risk for infection. You may be given CHG to use for bathing. It may be in a bottle or in a prepackaged cloth to use on your skin. Carefully follow your health care provider's instructions and the instructions on the product label. Do not use CHG if you have a chlorhexidine allergy. Contact your health care provider if your skin gets irritated after scrubbing. This information is not intended to replace advice given to you by your health care provider. Make sure you discuss any questions you have with your health care provider. Document Revised: 03/26/2022 Document Reviewed: 02/06/2021 Elsevier Patient Education  [place].

## 2022-11-12 ENCOUNTER — Other Ambulatory Visit: Payer: Self-pay

## 2022-11-12 ENCOUNTER — Encounter (HOSPITAL_COMMUNITY)
Admission: RE | Admit: 2022-11-12 | Discharge: 2022-11-12 | Disposition: A | Discharge status: Home / Self Care | Payer: Managed Care, Other (non HMO) | Source: Ambulatory Visit | Attending: Obstetrics & Gynecology | Admitting: Obstetrics & Gynecology

## 2022-11-12 ENCOUNTER — Encounter (HOSPITAL_COMMUNITY): Payer: Self-pay

## 2022-11-12 DIAGNOSIS — Z01818 Encounter for other preprocedural examination: Secondary | ICD-10-CM | POA: Insufficient documentation

## 2022-11-12 DIAGNOSIS — I517 Cardiomegaly: Secondary | ICD-10-CM | POA: Diagnosis not present

## 2022-11-12 LAB — COMPREHENSIVE METABOLIC PANEL
ALT: 14 U/L (ref 0–44)
AST: 17 U/L (ref 15–41)
Albumin: 4.1 g/dL (ref 3.5–5.0)
Alkaline Phosphatase: 85 U/L (ref 38–126)
Anion gap: 9 (ref 5–15)
BUN: 12 mg/dL (ref 6–20)
CO2: 23 mmol/L (ref 22–32)
Calcium: 8.9 mg/dL (ref 8.9–10.3)
Chloride: 105 mmol/L (ref 98–111)
Creatinine, Ser: 0.61 mg/dL (ref 0.44–1.00)
GFR, Estimated: >60 mL/min (ref >60)
Glucose, Bld: 107 mg/dL — ABNORMAL HIGH (ref 70–99)
Potassium: 3.4 mmol/L — ABNORMAL LOW (ref 3.5–5.1)
Sodium: 137 mmol/L (ref 135–145)
Total Bilirubin: 0.5 mg/dL (ref 0.3–1.2)
Total Protein: 6.6 g/dL (ref 6.5–8.1)

## 2022-11-12 LAB — CBC
HCT: 39.4 % (ref 36.0–46.0)
Hemoglobin: 13.1 g/dL (ref 12.0–15.0)
MCH: 30.5 pg (ref 26.0–34.0)
MCHC: 33.2 g/dL (ref 30.0–36.0)
MCV: 91.8 fL (ref 80.0–100.0)
Platelets: 273 10*3/uL (ref 150–400)
RBC: 4.29 MIL/uL (ref 3.87–5.11)
RDW: 13.5 % (ref 11.5–15.5)
WBC: 7.8 10*3/uL (ref 4.0–10.5)
nRBC: 0 % (ref 0.0–0.2)

## 2022-11-12 LAB — TYPE AND SCREEN
ABO/RH(D): AB POS
Antibody Screen: NEGATIVE

## 2022-11-12 NOTE — H&P (Signed)
[facility]stetrics and Gynecology Attending History and Physical  Carla Wise is a 56 y.o. PM female who presents for schedule LAVH, BSO due to high grade cervical dysplasia.    In review, recent Pap AGUS, Colposcopy with biopsy completed- results showed CIN 3 including ECC.  She denies any irregular bleeding.  Denies any abnormal vaginal discharge, fevers, chills, sweats, dysuria, nausea, vomiting, other GI or GU symptoms or other general symptoms.  No acute complaints or changes since her last visit.  Past Medical History:  Diagnosis Date   Abnormal Pap smear of vagina 2023   Barrett's esophagus    Constipation    Gastric adenoma 02/2017   GERD (gastroesophageal reflux disease)    PONV (postoperative nausea and vomiting)    Past Surgical History:  Procedure Laterality Date   24 HOUR [place] STUDY N/A 07/28/2020   Procedure: 24 HOUR [place] STUDY;  Surgeon: Lavena Bullion, DO;   No evidence of pathologic gastroesophageal reflux.  Reflux symptoms slightly elevated, predominantly weekly.  Increase weekly acid reflux symptoms could be secondary to supragastric belching. Recommended trial of baclofen, not helpful   BALLOON DILATION N/A 03/17/2021   Procedure: BALLOON DILATION;  Surgeon: [name], DO;  [facility];  Service: Endoscopy;  Laterality: N/A;   BIOPSY  01/30/2019   Procedure: BIOPSY;  Surgeon: Danie Binder, MD;  [facility];  Service: Endoscopy;;   BIOPSY  03/17/2021   Procedure: BIOPSY;  Surgeon: [name], DO;  [facility];  Service: Endoscopy;;  gastric GEJ   COLONOSCOPY WITH PROPOFOL N/A 03/17/2021   Procedure: COLONOSCOPY WITH PROPOFOL;  Surgeon: [name], DO;  [facility];  Service: Endoscopy;  Laterality: N/A;  PM   ESOPHAGEAL MANOMETRY N/A 07/27/2020   Procedure: ESOPHAGEAL MANOMETRY (EM);  Surgeon: Lavena Bullion, DO;  Normal   ESOPHAGOGASTRODUODENOSCOPY N/A 03/01/2017   Dr. Oneida Alar:  benign-appearing peptic stricture s/p dilation, gastric polyp (tubular adenoma), gastritis, duodenal diverticulm   ESOPHAGOGASTRODUODENOSCOPY N/A 01/30/2019   Procedure: ESOPHAGOGASTRODUODENOSCOPY (EGD);  Surgeon: Danie Binder, MD;  Benign-appearing esophageal stenosis, single 4 mm sessile polyp (hyperplastic), mild gastritis, normal examined duodenum (pathology- Peptic duodenitis)   ESOPHAGOGASTRODUODENOSCOPY (EGD) WITH PROPOFOL N/A 03/17/2021   Procedure: ESOPHAGOGASTRODUODENOSCOPY (EGD) WITH PROPOFOL;  Surgeon: [name], DO;  [facility];  Service: Endoscopy;  Laterality: N/A;   EXAM UNDER ANESTHESIA WITH MANIPULATION OF SHOULDER Left 06/06/2017   Procedure: EXAM UNDER ANESTHESIA WITH MANIPULATION OF SHOULDER;  Surgeon: Carole Civil, MD;  Location: [facility];  Service: Orthopedics;  Laterality: Left;  full relaxation   FOOT SURGERY Left 2003   hammer toe- great toe   KNEE ARTHROSCOPY W/ MENISCECTOMY     left knee   POLYPECTOMY  03/17/2021   Procedure: POLYPECTOMY;  Surgeon: [name], DO;  [facility];  Service: Endoscopy;;   SAVORY DILATION N/A 03/01/2017   Procedure: SAVORY DILATION;  Surgeon: Danie Binder, MD;  [facility];  Service: Endoscopy;  Laterality: N/A;   UPPER GASTROINTESTINAL ENDOSCOPY  05/20/2020   Dr. Bryan Lemma: Benign stricture dilated 20 mm TTS balloon then fractured with forceps, 1 cm sliding-type HH, esophageal ulcer x2, Hill grade 3 valve, mild gastritis, no H. pylori.  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 Needs repeat EGD in 1 year.    OB/GYN History  -FTNSVD x1 -h/o endometrial ablation No current facility-administered medications on file prior to encounter.   Current  Outpatient Medications on File Prior to Encounter  Medication Sig Dispense Refill   LOTEMAX SM 0.38 % GEL Apply 1 drop to eye 4 (four) times daily.     meclizine (ANTIVERT) 25 MG tablet Take 1 tablet (25 mg total) by mouth 2  (two) times daily as needed for dizziness or nausea. 30 tablet 1   multivitamin-iron-minerals-folic acid (CENTRUM) chewable tablet Chew 1 tablet by mouth every evening.     zolpidem (AMBIEN) 5 MG tablet TAKE ONE TABLET BY MOUTH AT BEDTIME AS NEEDED FOR SLEEP 30 tablet 2   hydrocortisone (ANUSOL-HC) 2.5 % rectal cream APPLY A SMALL PEA SIZED AMOUNT PER YOUR RECTUM 2 TO 4 TIMES DAILY FOR 10 TO 14 DAYS. (Patient taking differently: Place 1 Application rectally 4 (four) times daily as needed for anal itching or hemorrhoids.) 30 g 0   hydrOXYzine (ATARAX) 25 MG tablet Take 1/2-1 tab po qhs prn sleep (Patient not taking: Reported on 11/07/2022) 30 tablet 0   lidocaine (XYLOCAINE) 5 % ointment APPLY A SMALL PEA SIZED AMOUNT PER RECTUM TWICE DAILY FOR RECTAL PAIN RELATED TO HEMMORRHOIDS. (Patient taking differently: Apply 1 Application topically 2 (two) times daily as needed (pain/irritation.).) 35.44 g 0   Plecanatide (TRULANCE) 3 MG TABS Take 3 mg by mouth daily as needed (constipation.).     Allergies  Allergen Reactions   Azithromycin     Patient gets worst whenever she takes a Z-Pak   Levaquin [Levofloxacin In D5w]     Makes symptoms worse    Social History:   reports that she has been smoking cigarettes. She has a 6.25 pack-year smoking history. She has never used smokeless tobacco. She reports that she does not drink alcohol and does not use drugs. Family History  Problem Relation Age of Onset   Heart disease Other    Cancer Other    Colon cancer Neg Hx    Esophageal cancer Neg Hx    Rectal cancer Neg Hx    Stomach cancer Neg Hx     Review of Systems: Pertinent items noted in HPI and remainder of comprehensive ROS otherwise negative.  PHYSICAL EXAM: Blood pressure 107/66, pulse 69, temperature 97.9 F (36.6 C), temperature source Oral, resp. rate 18, height '5\' 1"'$  (1.549 m), weight 45.4 kg, SpO2 100 %.  CONSTITUTIONAL: Well-developed, well-nourished female in no acute distress.  SKIN:  Skin is warm and dry. No rash noted. Not diaphoretic. No erythema. No pallor. NEUROLOGIC: Alert and oriented to person, place, and time. Normal reflexes, muscle tone coordination. No cranial nerve deficit noted. PSYCHIATRIC: Normal mood and affect. Normal behavior. Normal judgment and thought content. CARDIOVASCULAR: Normal heart rate noted, regular rhythm RESPIRATORY: Effort and breath sounds normal, no problems with respiration noted ABDOMEN: Soft, nontender, nondistended. PELVIC: deferred MUSCULOSKELETAL: no calf tenderness bilaterally EXT: no edema bilaterally, normal pulses  Labs: Results for orders placed or performed during the hospital encounter of 11/12/22 (from the past 336 hour(s))  CBC   Collection Time: 11/12/22  3:35 PM  Result Value Ref Range   WBC 7.8 4.0 - 10.5 K/uL   RBC 4.29 3.87 - 5.11 MIL/uL   Hemoglobin 13.1 12.0 - 15.0 g/dL   HCT 39.4 36.0 - 46.0 %   MCV 91.8 80.0 - 100.0 fL   MCH 30.5 26.0 - 34.0 pg   MCHC 33.2 30.0 - 36.0 g/dL   RDW 13.5 11.5 - 15.5 %   Platelets 273 150 - 400 K/uL   nRBC 0.0 0.0 - 0.2 %  Comprehensive metabolic panel   Collection Time: 11/12/22  3:35 PM  Result Value Ref Range   Sodium 137 135 - 145 mmol/L   Potassium 3.4 (L) 3.5 - 5.1 mmol/L   Chloride 105 98 - 111 mmol/L   CO2 23 22 - 32 mmol/L   Glucose, Bld 107 (H) 70 - 99 mg/dL   BUN 12 6 - 20 mg/dL   Creatinine, Ser 0.61 0.44 - 1.00 mg/dL   Calcium 8.9 8.9 - 10.3 mg/dL   Total Protein 6.6 6.5 - 8.1 g/dL   Albumin 4.1 3.5 - 5.0 g/dL   AST 17 15 - 41 U/L   ALT 14 0 - 44 U/L   Alkaline Phosphatase 85 38 - 126 U/L   Total Bilirubin 0.5 0.3 - 1.2 mg/dL   GFR, Estimated >60 >60 mL/min   Anion gap 9 5 - 15  Type and screen   Collection Time: 11/12/22  3:35 PM  Result Value Ref Range   ABO/RH(D) AB POS    Antibody Screen NEG    Sample Expiration 11/26/2022,2359    Extend sample reason      NO TRANSFUSIONS OR PREGNANCY IN THE PAST 3 MONTHS Performed at [facility],  [address] [place], [place] [address]     Imaging Studies:   Uterus - Measurements: 5.2 x 1.8 x 2.5 cm = volume: 12.3 mL. No fibroids or other mass visualized. Probable nabothian cyst.  Ovaries not visualized.  Assessment: High grade cervical dysplasia   Plan: Laparoscopic assisted vaginal hysterectomy and bilateral salpingo-oophrectomy -Ancef 2g IV -NPO -LR @ 125cc/hr -SCDs to OR -Toradol IV preop -Risk/benefits and alternatives reviewed with the patient including but not limited to risk of bleeding, infection and injury to surrounding organs.  Also discussed should carcinoma be noted on pathology further management/intervention may be indicated.  Questions and concerns were addressed and pt desires to proceed  Janyth Pupa, DO Attending [place], [facility] for [facility], [place]

## 2022-11-14 ENCOUNTER — Encounter (HOSPITAL_COMMUNITY): Payer: Self-pay | Admitting: Obstetrics & Gynecology

## 2022-11-14 ENCOUNTER — Ambulatory Visit (HOSPITAL_COMMUNITY)
Admission: RE | Admit: 2022-11-14 | Discharge: 2022-11-14 | Disposition: A | Discharge status: Home / Self Care | Payer: Managed Care, Other (non HMO) | Attending: Obstetrics & Gynecology | Admitting: Obstetrics & Gynecology

## 2022-11-14 ENCOUNTER — Other Ambulatory Visit: Payer: Self-pay

## 2022-11-14 ENCOUNTER — Ambulatory Visit (HOSPITAL_BASED_OUTPATIENT_CLINIC_OR_DEPARTMENT_OTHER): Payer: Managed Care, Other (non HMO) | Admitting: Anesthesiology

## 2022-11-14 ENCOUNTER — Encounter (HOSPITAL_COMMUNITY)
Admission: RE | Disposition: A | Discharge status: Home / Self Care | Payer: Self-pay | Source: Home / Self Care | Attending: Obstetrics & Gynecology

## 2022-11-14 ENCOUNTER — Ambulatory Visit (HOSPITAL_COMMUNITY): Payer: Managed Care, Other (non HMO) | Admitting: Anesthesiology

## 2022-11-14 DIAGNOSIS — D069 Carcinoma in situ of cervix, unspecified: Secondary | ICD-10-CM | POA: Insufficient documentation

## 2022-11-14 DIAGNOSIS — Z01818 Encounter for other preprocedural examination: Secondary | ICD-10-CM

## 2022-11-14 DIAGNOSIS — K219 Gastro-esophageal reflux disease without esophagitis: Secondary | ICD-10-CM | POA: Insufficient documentation

## 2022-11-14 DIAGNOSIS — N871 Moderate cervical dysplasia: Secondary | ICD-10-CM

## 2022-11-14 DIAGNOSIS — F17210 Nicotine dependence, cigarettes, uncomplicated: Secondary | ICD-10-CM | POA: Diagnosis not present

## 2022-11-14 HISTORY — PX: LAPAROSCOPIC VAGINAL HYSTERECTOMY WITH SALPINGO OOPHORECTOMY: SHX6681

## 2022-11-14 LAB — ABO/RH: ABO/RH(D): AB POS

## 2022-11-14 SURGERY — HYSTERECTOMY, VAGINAL, LAPAROSCOPY-ASSISTED, WITH SALPINGO-OOPHORECTOMY
Anesthesia: General

## 2022-11-14 MED ORDER — PROPOFOL 10 MG/ML IV BOLUS
INTRAVENOUS | Status: DC | PRN
  Administered 2022-11-14: 80 mg via INTRAVENOUS

## 2022-11-14 MED ORDER — MIDAZOLAM HCL 2 MG/2ML IJ SOLN
INTRAMUSCULAR | Status: AC
  Filled 2022-11-14: qty 2

## 2022-11-14 MED ORDER — SUGAMMADEX SODIUM 200 MG/2ML IV SOLN
INTRAVENOUS | Status: DC | PRN
  Administered 2022-11-14: 200 mg via INTRAVENOUS

## 2022-11-14 MED ORDER — KETOROLAC TROMETHAMINE 10 MG PO TABS: 10.0000 mg | ORAL_TABLET | Freq: Three times a day (TID) | ORAL | 0 refills | Status: AC

## 2022-11-14 MED ORDER — LIDOCAINE HCL 1 % IJ SOLN
INTRAMUSCULAR | Status: DC | PRN
  Administered 2022-11-14: 25 mL

## 2022-11-14 MED ORDER — ONDANSETRON HCL 4 MG/2ML IJ SOLN
INTRAMUSCULAR | Status: AC
  Filled 2022-11-14: qty 2

## 2022-11-14 MED ORDER — POVIDONE-IODINE 10 % EX SWAB
2.0000 | Freq: Once | CUTANEOUS | Status: AC
  Administered 2022-11-14: 2 via TOPICAL

## 2022-11-14 MED ORDER — DIPHENHYDRAMINE HCL 50 MG/ML IJ SOLN
INTRAMUSCULAR | Status: AC
  Filled 2022-11-14: qty 1

## 2022-11-14 MED ORDER — CEFAZOLIN SODIUM-DEXTROSE 2-4 GM/100ML-% IV SOLN
2.0000 g | INTRAVENOUS | Status: AC
  Administered 2022-11-14: 2 g via INTRAVENOUS

## 2022-11-14 MED ORDER — ONDANSETRON HCL 4 MG PO TABS: 4.0000 mg | ORAL_TABLET | Freq: Three times a day (TID) | ORAL | 0 refills | Status: DC | PRN

## 2022-11-14 MED ORDER — LACTATED RINGERS IV SOLN: INTRAVENOUS | Status: DC

## 2022-11-14 MED ORDER — PHENYLEPHRINE 80 MCG/ML (10ML) SYRINGE FOR IV PUSH (FOR BLOOD PRESSURE SUPPORT)
PREFILLED_SYRINGE | INTRAVENOUS | Status: DC | PRN
  Administered 2022-11-14 (×3): 160 ug via INTRAVENOUS

## 2022-11-14 MED ORDER — CHLORHEXIDINE GLUCONATE 0.12 % MT SOLN
15.0000 mL | Freq: Once | OROMUCOSAL | Status: AC
  Administered 2022-11-14: 15 mL via OROMUCOSAL

## 2022-11-14 MED ORDER — OXYCODONE HCL 5 MG PO TABS: 5.0000 mg | ORAL_TABLET | Freq: Four times a day (QID) | ORAL | 0 refills | Status: DC | PRN

## 2022-11-14 MED ORDER — IBUPROFEN 600 MG PO TABS: 600.0000 mg | ORAL_TABLET | Freq: Four times a day (QID) | ORAL | 0 refills | Status: DC | PRN

## 2022-11-14 MED ORDER — ROCURONIUM BROMIDE 10 MG/ML (PF) SYRINGE
PREFILLED_SYRINGE | INTRAVENOUS | Status: AC
  Filled 2022-11-14: qty 10

## 2022-11-14 MED ORDER — LIDOCAINE HCL (PF) 2 % IJ SOLN
INTRAMUSCULAR | Status: AC
  Filled 2022-11-14: qty 5

## 2022-11-14 MED ORDER — BUPIVACAINE-EPINEPHRINE (PF) 0.5% -1:200000 IJ SOLN
INTRAMUSCULAR | Status: AC
  Filled 2022-11-14: qty 30

## 2022-11-14 MED ORDER — SUGAMMADEX SODIUM 500 MG/5ML IV SOLN
INTRAVENOUS | Status: AC
  Filled 2022-11-14: qty 5

## 2022-11-14 MED ORDER — STERILE WATER FOR IRRIGATION IR SOLN
Status: DC | PRN
  Administered 2022-11-14: 500 mL

## 2022-11-14 MED ORDER — MIDAZOLAM HCL 2 MG/2ML IJ SOLN
INTRAMUSCULAR | Status: DC | PRN
  Administered 2022-11-14: 2 mg via INTRAVENOUS

## 2022-11-14 MED ORDER — ORAL CARE MOUTH RINSE: 15.0000 mL | Freq: Once | OROMUCOSAL | Status: AC

## 2022-11-14 MED ORDER — ROCURONIUM BROMIDE 10 MG/ML (PF) SYRINGE
PREFILLED_SYRINGE | INTRAVENOUS | Status: DC | PRN
  Administered 2022-11-14: 40 mg via INTRAVENOUS

## 2022-11-14 MED ORDER — KETOROLAC TROMETHAMINE 15 MG/ML IJ SOLN
15.0000 mg | INTRAMUSCULAR | Status: AC
  Administered 2022-11-14: 15 mg via INTRAVENOUS
  Filled 2022-11-14: qty 1

## 2022-11-14 MED ORDER — EPINEPHRINE 1 MG/10ML IJ SOSY
PREFILLED_SYRINGE | INTRAMUSCULAR | Status: AC
  Filled 2022-11-14: qty 10

## 2022-11-14 MED ORDER — GABAPENTIN 300 MG PO CAPS: 300.0000 mg | ORAL_CAPSULE | Freq: Three times a day (TID) | ORAL | 0 refills | Status: DC

## 2022-11-14 MED ORDER — FENTANYL CITRATE (PF) 250 MCG/5ML IJ SOLN
INTRAMUSCULAR | Status: AC
  Filled 2022-11-14: qty 5

## 2022-11-14 MED ORDER — SCOPOLAMINE 1 MG/3DAYS TD PT72
MEDICATED_PATCH | TRANSDERMAL | Status: AC
  Filled 2022-11-14: qty 1

## 2022-11-14 MED ORDER — ONDANSETRON HCL 4 MG/2ML IJ SOLN
4.0000 mg | Freq: Once | INTRAMUSCULAR | Status: AC | PRN
  Administered 2022-11-14: 4 mg via INTRAVENOUS
  Filled 2022-11-14: qty 2

## 2022-11-14 MED ORDER — BUPIVACAINE-EPINEPHRINE (PF) 0.5% -1:200000 IJ SOLN
INTRAMUSCULAR | Status: DC | PRN
  Administered 2022-11-14: 20 mL via PERINEURAL

## 2022-11-14 MED ORDER — PROPOFOL 500 MG/50ML IV EMUL
INTRAVENOUS | Status: AC
  Filled 2022-11-14: qty 50

## 2022-11-14 MED ORDER — ACETAMINOPHEN 325 MG PO TABS: 650.0000 mg | ORAL_TABLET | Freq: Four times a day (QID) | ORAL | Status: DC | PRN

## 2022-11-14 MED ORDER — DEXAMETHASONE SODIUM PHOSPHATE 10 MG/ML IJ SOLN
INTRAMUSCULAR | Status: AC
  Filled 2022-11-14: qty 1

## 2022-11-14 MED ORDER — LIDOCAINE HCL (PF) 1 % IJ SOLN
INTRAMUSCULAR | Status: AC
  Filled 2022-11-14: qty 30

## 2022-11-14 MED ORDER — HYDROMORPHONE HCL 1 MG/ML IJ SOLN: 0.2500 mg | INTRAMUSCULAR | Status: DC | PRN

## 2022-11-14 MED ORDER — DEXAMETHASONE SODIUM PHOSPHATE 10 MG/ML IJ SOLN
INTRAMUSCULAR | Status: DC | PRN
  Administered 2022-11-14: 10 mg via INTRAVENOUS

## 2022-11-14 MED ORDER — ACETAMINOPHEN 10 MG/ML IV SOLN
1000.0000 mg | Freq: Once | INTRAVENOUS | Status: AC
  Administered 2022-11-14: 1000 mg via INTRAVENOUS
  Filled 2022-11-14: qty 100

## 2022-11-14 MED ORDER — LIDOCAINE 2% (20 MG/ML) 5 ML SYRINGE
INTRAMUSCULAR | Status: DC | PRN
  Administered 2022-11-14: 60 mg via INTRAVENOUS

## 2022-11-14 MED ORDER — SODIUM CHLORIDE 0.9 % IR SOLN
Status: DC | PRN
  Administered 2022-11-14: 1000 mL

## 2022-11-14 MED ORDER — SCOPOLAMINE 1 MG/3DAYS TD PT72
1.0000 | MEDICATED_PATCH | TRANSDERMAL | Status: DC
  Administered 2022-11-14: 1.5 mg via TRANSDERMAL

## 2022-11-14 MED ORDER — DOCUSATE SODIUM 100 MG PO CAPS: 100.0000 mg | ORAL_CAPSULE | Freq: Two times a day (BID) | ORAL | 0 refills | Status: AC

## 2022-11-14 MED ORDER — FENTANYL CITRATE (PF) 250 MCG/5ML IJ SOLN
INTRAMUSCULAR | Status: DC | PRN
  Administered 2022-11-14 (×5): 50 ug via INTRAVENOUS

## 2022-11-14 MED ORDER — DIPHENHYDRAMINE HCL 50 MG/ML IJ SOLN
INTRAMUSCULAR | Status: DC | PRN
  Administered 2022-11-14: 12.5 mg via INTRAVENOUS

## 2022-11-14 MED ORDER — ARTIFICIAL TEARS OPHTHALMIC OINT
TOPICAL_OINTMENT | OPHTHALMIC | Status: AC
  Filled 2022-11-14: qty 3.5

## 2022-11-14 MED ORDER — EPHEDRINE 5 MG/ML INJ
INTRAVENOUS | Status: AC
  Filled 2022-11-14: qty 10

## 2022-11-14 MED ORDER — KETOROLAC TROMETHAMINE 30 MG/ML IJ SOLN
INTRAMUSCULAR | Status: AC
  Filled 2022-11-14: qty 1

## 2022-11-14 MED ORDER — ONDANSETRON HCL 4 MG/2ML IJ SOLN
INTRAMUSCULAR | Status: DC | PRN
  Administered 2022-11-14: 4 mg via INTRAVENOUS

## 2022-11-14 SURGICAL SUPPLY — 54 items
APPLIER CLIP 5 13 M/L LIGAMAX5 (MISCELLANEOUS) ×1
BLADE SURG SZ11 CARB STEEL (BLADE) ×1 IMPLANT
CLIP APPLIE 5 13 M/L LIGAMAX5 (MISCELLANEOUS) ×1 IMPLANT
CLOTH BEACON ORANGE TIMEOUT ST (SAFETY) ×1 IMPLANT
COVER LIGHT HANDLE STERIS (MISCELLANEOUS) ×2 IMPLANT
DERMABOND ADVANCED .7 DNX12 (GAUZE/BANDAGES/DRESSINGS) ×1 IMPLANT
DILATOR CANAL MILEX (MISCELLANEOUS) ×1 IMPLANT
DRAPE STERI URO 9X17 APER PCH (DRAPES) ×1 IMPLANT
DURAPREP 26ML APPLICATOR (WOUND CARE) ×1 IMPLANT
GAUZE 4X4 16PLY ~~LOC~~+RFID DBL (SPONGE) ×3 IMPLANT
GLOVE BIO SURGEON STRL SZ 6.5 (GLOVE) ×1 IMPLANT
GLOVE BIOGEL PI IND STRL 7.0 (GLOVE) ×3 IMPLANT
GLOVE BIOGEL PI IND STRL 8 (GLOVE) ×1 IMPLANT
GLOVE ECLIPSE 8.0 STRL XLNG CF (GLOVE) ×1 IMPLANT
GOWN STRL REUS W/TWL LRG LVL3 (GOWN DISPOSABLE) ×2 IMPLANT
GOWN STRL REUS W/TWL XL LVL3 (GOWN DISPOSABLE) ×1 IMPLANT
IV NS IRRIG 3000ML ARTHROMATIC (IV SOLUTION) ×1 IMPLANT
KIT BLADEGUARD II DBL (SET/KITS/TRAYS/PACK) ×1 IMPLANT
KIT TURNOVER CYSTO (KITS) ×1 IMPLANT
KIT TURNOVER KIT A (KITS) ×1 IMPLANT
LIGASURE IMPACT 36 18CM CVD LR (INSTRUMENTS) IMPLANT
NDL HYPO 21X1.5 SAFETY (NEEDLE) ×1 IMPLANT
NDL HYPO 25X1 1.5 SAFETY (NEEDLE) ×1 IMPLANT
NDL INSUFFLATION 14GA 120MM (NEEDLE) ×1 IMPLANT
NEEDLE HYPO 21X1.5 SAFETY (NEEDLE) ×1 IMPLANT
NEEDLE HYPO 25X1 1.5 SAFETY (NEEDLE) ×1 IMPLANT
NEEDLE INSUFFLATION 14GA 120MM (NEEDLE) ×1 IMPLANT
PACK BASIC III (CUSTOM PROCEDURE TRAY) ×1
PACK PERI GYN (CUSTOM PROCEDURE TRAY) ×1 IMPLANT
PACK SRG BSC III STRL LF ECLPS (CUSTOM PROCEDURE TRAY) ×1 IMPLANT
PACK TRENDGUARD 450 HYBRID PRO (MISCELLANEOUS) IMPLANT
PAD ARMBOARD 7.5X6 YLW CONV (MISCELLANEOUS) ×1 IMPLANT
POWDER SURGICEL 3.0 GRAM (HEMOSTASIS) IMPLANT
SET BASIN LINEN APH (SET/KITS/TRAYS/PACK) ×1 IMPLANT
SET IRRIG TUBING LAPAROSCOPIC (IRRIGATION / IRRIGATOR) ×1 IMPLANT
SET TUBE SMOKE EVAC HIGH FLOW (TUBING) ×1 IMPLANT
SHEARS HARMONIC ACE PLUS 36CM (ENDOMECHANICALS) ×1 IMPLANT
SLEEVE Z-THREAD 5X100MM (TROCAR) ×2 IMPLANT
SOL ANTI FOG 6CC (MISCELLANEOUS) ×1 IMPLANT
SPIKE FLUID TRANSFER (MISCELLANEOUS) ×2 IMPLANT
SPONGE T-LAP 4X18 ~~LOC~~+RFID (SPONGE) ×1 IMPLANT
SUT MON AB 4-0 PS1 27 (SUTURE) IMPLANT
SUT VIC AB 0 CT1 27 (SUTURE) ×2
SUT VIC AB 0 CT1 27XCR 8 STRN (SUTURE) ×2 IMPLANT
SUT VIC AB 0 CT1 36 (SUTURE) ×2 IMPLANT
SYR CONTROL 10ML LL (SYRINGE) ×2 IMPLANT
SYSTEM CARTER THOMASON II (TROCAR) IMPLANT
TIP ENDOSCOPIC SURGICEL (TIP) IMPLANT
TRAY FOLEY W/BAG SLVR 14FR (SET/KITS/TRAYS/PACK) ×1 IMPLANT
TRENDGUARD 450 HYBRID PRO PACK (MISCELLANEOUS)
TROCAR Z-THREAD FIOS 5X100MM (TROCAR) ×1 IMPLANT
UNDERPAD 30X36 HEAVY ABSORB (UNDERPADS AND DIAPERS) ×1 IMPLANT
VERSALIGHT (MISCELLANEOUS) ×1 IMPLANT
WARMER LAPAROSCOPE (MISCELLANEOUS) ×1 IMPLANT

## 2022-11-14 NOTE — Anesthesia Preprocedure Evaluation (Signed)
Anesthesia Evaluation  Patient identified by MRN, date of birth, ID band Patient awake    Reviewed: Allergy & Precautions, H&P , NPO status , Patient's Chart, lab work & pertinent test results  History of Anesthesia Complications (+) PONV and history of anesthetic complications  Airway Mallampati: II  TM Distance: >3 FB Neck ROM: Full    Dental  (+) Dental Advisory Given, Caps   Pulmonary Current Smoker and Patient abstained from smoking.   Pulmonary exam normal breath sounds clear to auscultation       Cardiovascular negative cardio ROS Normal cardiovascular exam Rhythm:Regular Rate:Normal     Neuro/Psych  PSYCHIATRIC DISORDERS Anxiety     negative neurological ROS     GI/Hepatic Neg liver ROS,GERD  Medicated, Controlled and Poorly Controlled,,  Endo/Other  negative endocrine ROS    Renal/GU negative Renal ROS  negative genitourinary   Musculoskeletal  (+) Arthritis , Osteoarthritis,    Abdominal   Peds negative pediatric ROS (+)  Hematology negative hematology ROS (+)   Anesthesia Other Findings   Reproductive/Obstetrics negative OB ROS                             Anesthesia Physical Anesthesia Plan  ASA: 2  Anesthesia Plan: General   Post-op Pain Management: Dilaudid IV   Induction: Intravenous  PONV Risk Score and Plan: 4 or greater and Ondansetron, Dexamethasone, Scopolamine patch - Pre-op and Midazolam  Airway Management Planned: Oral ETT  Additional Equipment:   Intra-op Plan:   Post-operative Plan: Extubation in OR  Informed Consent: I have reviewed the patients History and Physical, chart, labs and discussed the procedure including the risks, benefits and alternatives for the proposed anesthesia with the patient or authorized representative who has indicated his/her understanding and acceptance.     Dental advisory given  Plan Discussed with: CRNA and  Surgeon  Anesthesia Plan Comments:         Anesthesia Quick Evaluation

## 2022-11-14 NOTE — Discharge Instructions (Addendum)
Post Operative Pain Med Plan:  >Take gabapentin 300 mg three times per day, as prescribed for 4 days, try to space them evenly  >Take the oxycodone- 1 tablet, on a schedule, around the clock, every 6 hours(set your phone alarm) for the first 2 days, there may be times when you will need 2 tablets but taking them on a schedule will decrease this need  >You can also take Tylenol together with the oxycodone.  If the oxycodone seems "to strong" then just take Tylenol  >Oxycodone will cause constipation, please be sure to take a stool softener (Colace) twice daily while taking this pain medication and/or continue this medication until your bowel regimen returns to normal  >Take the Toradol every 8 hours for the first 3 days then the remainder to supplement the pain as needed  >After the toradol is gone switch to Ibuprofen '600mg'$  every 6 hours as needed  If possible try to take the Toradol or Ibuprofen with food to help avoid upsetting your stomach  >Use a heating pad as well as needed  >I have also sent a prescription for zofran (ondansetron) for nausea to take if needed over the first couple of days  >Be gentle with your diet the first few days, liquids and soft non spicy food, fruits are great  >Get up and move, no lifting or straining HOME INSTRUCTIONS  Please note any unusual or excessive bleeding, pain, swelling. Mild dizziness or drowsiness are normal for about 24 hours after surgery.   Shower when comfortable  Restrictions: No driving for 24 hours or while taking pain medications.  Activity:  No heavy lifting (> 10 lbs), nothing in vagina (no tampons, douching, or intercourse) x 4 weeks; no tub baths for 4 weeks Vaginal spotting is expected but if your bleeding is heavy, period like,  please call the office   Incision: the bandaids will fall off when they are ready to; you may clean your incision with mild soap and water but do not rub or scrub the incision site.  You may  experience slight bloody drainage from your incision periodically.  This is normal.  If you experience a large amount of drainage or the incision opens, please call your physician who will likely direct you to the emergency department.  Diet:  You may return to your regular diet.  Do not eat large meals.  Eat small frequent meals throughout the day.  Continue to drink a good amount of water at least 6-8 glasses of water per day, hydration is very important for the healing process.  Pain Management: Follow above instructions.  Always take prescription pain medication with food.  Oxycodone may cause constipation, you may want to take a stool softener while taking this medication.  A prescription of colace has been sent in to take twice daily if needed while taking the oxycodone.  Be sure to drink plenty of fluids and increase your fiber to help with constipation.  DO NOT TAKE AMBIEN WHILE TAKING PAIN MEDICATIONS (OXYCODONE)  Alcohol -- Avoid for 24 hours and while taking pain medications.  Nausea: Take sips of ginger ale or soda  Fever -- Call physician if temperature over 101 degrees  Follow up:  If you do not already have a follow up appointment scheduled, please call the office at [phone].  If you experience fever (a temperature greater than 100.4), pain unrelieved by pain medication, shortness of breath, swelling of a single leg, or any other symptoms which are concerning to you  please the office immediately.

## 2022-11-14 NOTE — Anesthesia Postprocedure Evaluation (Signed)
Anesthesia Post Note  Patient: Carla Wise  Procedure(s) Performed: LAPAROSCOPIC ASSISTED VAGINAL HYSTERECTOMY WITH SALPINGO OOPHORECTOMY  Patient location during evaluation: Phase II Anesthesia Type: General Level of consciousness: awake and alert and oriented Pain management: pain level controlled Vital Signs Assessment: post-procedure vital signs reviewed and stable Respiratory status: spontaneous breathing, nonlabored ventilation and respiratory function stable Cardiovascular status: blood pressure returned to baseline and stable Postop Assessment: no apparent nausea or vomiting Anesthetic complications: no  No notable events documented.   Last Vitals:  Vitals:   11/14/22 1030 11/14/22 1103  BP: 124/73 133/69  Pulse: 68 77  Resp: 13 16  Temp: 36.4 C 36.5 C  SpO2: 93% 100%    Last Pain:  Vitals:   11/14/22 1103  TempSrc: Oral  PainSc: 0-No pain                 Quintrell Baze C Jakaria Lavergne

## 2022-11-14 NOTE — Op Note (Signed)
Preoperative Diagnosis: CIN 3 Postoperative Diagnosis: same  Procedure: Laparoscopy Assisted Vaginal Hysterectomy, Bilateral salpingectomy Surgeon: Dr. Janyth Pupa  Assistant: Dr. Darlis Loan Anesthetic: General  IVF: 1200cc EBL: 50cc  UOP: 200cc Specimens: 1) Bilateral fallopian tubes with ovaries and uterus   Findings: No free fluid or omental studding appreciated. Normal appearing liver, gallbladder and bowel. Mild pelvic adhesions of sigmoid to left  lower quadrant and mild bowel adhesions in right lower quadrant.  Normal uterus, normal fallopian tubes and ovaries bilaterally.    Procedure: The patient was taken to the operating room where general anesthesia was found to be adequate. The patient's abdomen was prepped with ChloraPrep. The perineum and vagina were prepped with multiple layers of Betadine. The patient was sterilely draped. A Foley catheter was placed in the bladder. A Vcare manipulator was attempted, but could not be placed due to the small size of the uterus.  The Hulka manipulator was placed without difficulty.  Gown and gloves were changed and attention was turned to the abdomen. An incision was made in the infraumbilical area and the Veress needle was inserted into the abdominal cavity without difficulty. Proper placement was confirmed using the saline drop test and opening pressure was 17mHg. A pneumoperitoneum was obtained. The laparoscopic trocar and the laparoscope were placed under direct visualization. Two additional ports were placed in the right and left lower quadrants. Each area was injected with quarter percent Marcaine. A small incision was made and a 5 mm trocar was inserted into the abdominal cavity under direct visualization. An abdominal scan was performed with the findings noted above.   The right lower quadrants adhesions were taken down using the Harmonic.  Excellent hemostasis was noted.  Attention was turned to the right adnexa.  The right round ligament was  then grasped, elevated, fulgurated and divided using the Harmonic. The IP was identified and serial ligation of the right fallopian tube with ovary was performed.  The anterior leaf of the broad ligament was incised inferiorly and the vesicouterine flap was created.  The right uterine artery was clamped with surgical clips and ligated with the Harmonic.  A similar procedure was carried out on the left.  Blanching of the uterus was identified.  Attention was turned back to the left adnexa.  The sigmoid adhesions were taken down bluntly until the ovary could be seen.  The Harmonic was then used to resect in a serial fashion the left fallopian tube and ovary.  Hemostasis was noted.  The case then proceeded to the vaginal portion.  The manipulator was removed and a weighted speculum was placed in the posterior vagina. The cervix was injected with half percent Lidocaine with epinephrine. The cervix was then circumferentially incised with the bovie and the bladder was dissected off the pubovesical cervical fascia. The posterior cul-de-sac was entered sharply.  A heany clamp was placed over the uterosacral ligaments bilaterally. These were transected and suture ligated with 0 vicryl. The cardinal ligaments were then clamped bilaterally and transected and suture ligated in a similar fashion.  The uterus was then flipped to allow for palpation of the remaining broad ligament connection, which was clamped, cut and suture ligated on both the right and left side.  The uterus and  bilateral fallopian tube were removed from the operative field and sent to pathology. Hemostasis was confirmed. The vaginal cuff was closed using both figure of eight stitches and a running locked fashion using 0 vicryl.  Gown and gloves were changed and attention was  turned to the abdomen.  The pneumoperitoneum was reestablished. Excellent hemostasis was noted.  Surgicel was placed.  The lateral 5 mm trocars were removed under direct  visualization. The pneumoperitoneum was allowed to escape. The supraumbilical trocar was removed under direct visualization. Dermabond was placed over the three trocar sites. The patient tolerated her procedure well. She was awakened from her anesthetic without difficulty and then transported to the recovery room in stable condition. Sponge, needle, and instrument counts were correct.    An experienced assistant was required given the standard of surgical care given the complexity of the case.  This assistant was needed for exposure, dissection, suctioning, retraction, instrument exchange and for overall help during the procedure.  Janyth Pupa, DO Attending [place], [facility] for [employer], [place]

## 2022-11-14 NOTE — Anesthesia Procedure Notes (Signed)
Procedure Name: Intubation Date/Time: 11/14/2022 7:37 AM  Performed by: Genelle Bal, CRNAPre-anesthesia Checklist: Patient identified, Emergency Drugs available, Suction available and Patient being monitored Patient Re-evaluated:Patient Re-evaluated prior to induction Oxygen Delivery Method: Circle system utilized Preoxygenation: Pre-oxygenation with 100% oxygen Induction Type: IV induction Ventilation: Mask ventilation without difficulty Laryngoscope Size: Miller and 2 Grade View: Grade I Tube type: Oral Tube size: 7.0 mm Number of attempts: 1 Airway Equipment and Method: Stylet Placement Confirmation: ETT inserted through vocal cords under direct vision, positive ETCO2 and breath sounds checked- equal and bilateral Secured at: 21 cm Tube secured with: Tape Dental Injury: Teeth and Oropharynx as per pre-operative assessment

## 2022-11-14 NOTE — Transfer of Care (Signed)
Immediate Anesthesia Transfer of Care Note  Patient: Carla Wise  Procedure(s) Performed: LAPAROSCOPIC ASSISTED VAGINAL HYSTERECTOMY WITH SALPINGO OOPHORECTOMY  Patient Location: PACU  Anesthesia Type:General  Level of Consciousness: awake, alert , and oriented  Airway & Oxygen Therapy: Patient Spontanous Breathing and Patient connected to face mask oxygen  Post-op Assessment: Report given to RN and Post -op Vital signs reviewed and stable  Post vital signs: Reviewed and stable  Last Vitals:  Vitals Value Taken Time  BP 145/88   Temp    Pulse 79 11/14/22 0942  Resp 11 11/14/22 0942  SpO2 100 % 11/14/22 0942  Vitals shown include unvalidated device data.  Last Pain:  Vitals:   11/14/22 0641  TempSrc: Oral  PainSc:          Complications: No notable events documented.

## 2022-11-15 LAB — SURGICAL PATHOLOGY

## 2022-11-19 ENCOUNTER — Encounter (HOSPITAL_COMMUNITY): Payer: Self-pay | Admitting: Obstetrics & Gynecology

## 2022-11-19 ENCOUNTER — Other Ambulatory Visit (HOSPITAL_COMMUNITY): Payer: Managed Care, Other (non HMO)

## 2022-11-21 ENCOUNTER — Encounter: Payer: Self-pay | Admitting: Obstetrics & Gynecology

## 2022-11-21 ENCOUNTER — Ambulatory Visit (INDEPENDENT_AMBULATORY_CARE_PROVIDER_SITE_OTHER): Payer: Managed Care, Other (non HMO) | Admitting: Obstetrics & Gynecology

## 2022-11-21 VITALS — BP 133/81 | HR 85 | Ht 61.0 in | Wt 100.6 lb

## 2022-11-21 DIAGNOSIS — Z4889 Encounter for other specified surgical aftercare: Secondary | ICD-10-CM

## 2022-11-21 NOTE — Progress Notes (Signed)
    PostOp Visit Note  Carla Wise is a 56 y.o. No obstetric history on file. female who presents for a postoperative visit. She is 1 week postop following a TLH, BSO completed on 12/6   Today she notes that she is doing well.  She is not really taking any medication, an occasional ibuprofen.  Denies fever or chills.  Tolerating gen diet.  +Flatus, Regular BMs.  Overall doing well and reports no acute complaints  Reviewed pathology- mild dysplasia- no further intervention indicated   Review of Systems Pertinent items are noted in HPI.    Objective:  BP 133/81 (BP Location: Right Arm, Patient Position: Sitting, Cuff Size: Normal)   Pulse 85   Ht '5\' 1"'$  (1.549 m)   Wt 100 lb 9.6 oz (45.6 kg)   BMI 19.01 kg/m    Physical Examination:  GENERAL ASSESSMENT: well developed and well nourished SKIN: normal color, no lesions CHEST: normal air exchange, respiratory effort normal with no retractions HEART: regular rate and rhythm ABDOMEN: soft, non-distended, +BS INCISION: clean/dry/intact- healing appropriately EXTREMITY: no edema, no calf tenderness PSYCH: mood appropriate, normal affect       Assessment:    S/p TLH, BSO   Plan:   -meeting milestones appropriately -reviewed postop activity/precautions -continue pelvic rest -f/u in 4-5 wks  Janyth Pupa, DO Attending [place], [place] for [employer], [place]

## 2022-12-11 ENCOUNTER — Telehealth: Payer: Self-pay | Admitting: Gastroenterology

## 2022-12-11 NOTE — Telephone Encounter (Signed)
Please NIC for EGD in 03/2026 for h/o Barrett's esophagus. I discussed with Dr. Abbey Chatters.

## 2022-12-13 ENCOUNTER — Telehealth: Payer: Managed Care, Other (non HMO) | Admitting: Emergency Medicine

## 2022-12-13 DIAGNOSIS — R051 Acute cough: Secondary | ICD-10-CM

## 2022-12-13 MED ORDER — DOXYCYCLINE HYCLATE 100 MG PO CAPS: 100.0000 mg | ORAL_CAPSULE | Freq: Two times a day (BID) | ORAL | 0 refills | Status: DC

## 2022-12-13 MED ORDER — BENZONATATE 100 MG PO CAPS: 100.0000 mg | ORAL_CAPSULE | Freq: Two times a day (BID) | ORAL | 0 refills | Status: DC | PRN

## 2022-12-13 NOTE — Progress Notes (Signed)
Virtual Visit Consent   Carla Wise, you are scheduled for a virtual visit with a [facility] provider today. Just as with appointments in the office, your consent must be obtained to participate. Your consent will be active for this visit and any virtual visit you may have with one of our providers in the next 365 days. If you have a MyChart account, a copy of this consent can be sent to you electronically.  As this is a virtual visit, video technology does not allow for your provider to perform a traditional examination. This may limit your provider's ability to fully assess your condition. If your provider identifies any concerns that need to be evaluated in person or the need to arrange testing (such as labs, EKG, etc.), we will make arrangements to do so. Although advances in technology are sophisticated, we cannot ensure that it will always work on either your end or our end. If the connection with a video visit is poor, the visit may have to be switched to a telephone visit. With either a video or telephone visit, we are not always able to ensure that we have a secure connection.  By engaging in this virtual visit, you consent to the provision of healthcare and authorize for your insurance to be billed (if applicable) for the services provided during this visit. Depending on your insurance coverage, you may receive a charge related to this service.  I need to obtain your verbal consent now. Are you willing to proceed with your visit today? KATNISS WEEDMAN has provided verbal consent on 12/13/2022 for a virtual visit (video or telephone). Montine Circle, PA-C  Date: 12/13/2022 10:06 AM  Virtual Visit via Video Note   I, Montine Circle, connected with  Carla Wise  ([id number], Apr 04, 1966) on 12/13/22 at 10:30 AM EST by a video-enabled telemedicine application and verified that I am speaking with the correct person using two identifiers.  Location: Patient: Virtual Visit Location  Patient: Home Provider: Virtual Visit Location Provider: Home Office   I discussed the limitations of evaluation and management by telemedicine and the availability of in person appointments. The patient expressed understanding and agreed to proceed.    History of Present Illness: Carla Wise is a 57 y.o. who identifies as a female who was assigned female at birth, and is being seen today for cough.  Reports significant productive cough. Describes this as thick brown/green/yellow chunks.  States that her symptoms have been worsening.  Has tried taking Sudafed.  States that nothing helps.  States that she hasn't had a fever.  She is a smoker.  She states that she recently had hysterectomy.   HPI: HPI  Problems:  Patient Active Problem List   Diagnosis Date Noted   CIN III (cervical intraepithelial neoplasia grade III) with severe dysplasia 11/14/2022   UTI (urinary tract infection) 10/29/2022   Dense breasts 07/13/2022   External hemorrhoid 01/23/2021   Barrett's esophagus 10/10/2020   Papanicolaou smear of cervix with positive 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 virus (HPV) test 09/23/2020   Insomnia 09/16/2020   Hyperlipidemia 09/16/2020   History of gastric polyp 01/28/2019   Gastroesophageal reflux disease 12/30/2018   Anxiety 06/18/2018   Adhesive capsulitis of left shoulder    Dyspepsia and disorder of function of stomach    Constipation 02/15/2017   Colon cancer screening 02/15/2017   Arthritis of knee, degenerative 03/09/2014   Medial meniscus, posterior horn derangement 08/02/2011    Allergies:  Allergies  Allergen Reactions  Azithromycin     Patient gets worst whenever she takes a Z-Pak   Levaquin [Levofloxacin In D5w]     Makes symptoms worse   Medications:  Current Outpatient Medications:    acetaminophen (TYLENOL) 325 MG tablet, Take 2 tablets (650 mg total) by mouth every 6 (six) hours as needed., Disp: , Rfl:    esomeprazole (NEXIUM) 40 MG capsule, Take 1 capsule by  mouth 30 minutes before a meal once to twice daily for acid reflux (Patient taking differently: Take 40 mg by mouth in the morning and at bedtime.), Disp: 180 capsule, Rfl: 3   hydrocortisone (ANUSOL-HC) 2.5 % rectal cream, APPLY A SMALL PEA SIZED AMOUNT PER YOUR RECTUM 2 TO 4 TIMES DAILY FOR 10 TO 14 DAYS. (Patient taking differently: Place 1 Application rectally 4 (four) times daily as needed for anal itching or hemorrhoids.), Disp: 30 g, Rfl: 0   ibuprofen (ADVIL) 600 MG tablet, Take 1 tablet (600 mg total) by mouth every 6 (six) hours as needed., Disp: 30 tablet, Rfl: 0   lidocaine (XYLOCAINE) 5 % ointment, APPLY A SMALL PEA SIZED AMOUNT PER RECTUM TWICE DAILY FOR RECTAL PAIN RELATED TO HEMMORRHOIDS. (Patient taking differently: Apply 1 Application topically 2 (two) times daily as needed (pain/irritation.).), Disp: 35.44 g, Rfl: 0   linaclotide (LINZESS) 145 MCG CAPS capsule, Take 1 capsule (145 mcg total) by mouth daily before breakfast. (Patient not taking: Reported on 11/07/2022), Disp: 30 capsule, Rfl: 11   LOTEMAX SM 0.38 % GEL, Apply 1 drop to eye 4 (four) times daily., Disp: , Rfl:    meclizine (ANTIVERT) 25 MG tablet, Take 1 tablet (25 mg total) by mouth 2 (two) times daily as needed for dizziness or nausea., Disp: 30 tablet, Rfl: 1   multivitamin-iron-minerals-folic acid (CENTRUM) chewable tablet, Chew 1 tablet by mouth every evening., Disp: , Rfl:    Plecanatide (TRULANCE) 3 MG TABS, Take 3 mg by mouth daily as needed (constipation.)., Disp: , Rfl:   Observations/Objective: Patient is well-developed, well-nourished in no acute distress.  Resting comfortably  at home.  Head is normocephalic, atraumatic.  No labored breathing.  Speech is clear and coherent with logical content.  Patient is alert and oriented at baseline.    Assessment and Plan: 1. Acute cough  Meds ordered this encounter  Medications   doxycycline (VIBRAMYCIN) 100 MG capsule    Sig: Take 1 capsule (100 mg total)  by mouth 2 (two) times daily.    Dispense:  20 capsule    Refill:  0    Order Specific Question:   Supervising Provider    Answer:   Chase Picket [[id number]]   benzonatate (TESSALON) 100 MG capsule    Sig: Take 1 capsule (100 mg total) by mouth 2 (two) times daily as needed for cough.    Dispense:  20 capsule    Refill:  0    Order Specific Question:   Supervising Provider    Answer:   Chase Picket [id number]      Follow Up Instructions: I discussed the assessment and treatment plan with the patient. The patient was provided an opportunity to ask questions and all were answered. The patient agreed with the plan and demonstrated an understanding of the instructions.  A copy of instructions were sent to the patient via MyChart unless otherwise noted below.     The patient was advised to call back or seek an in-person evaluation if the symptoms worsen or if the condition fails to  improve as anticipated.  Time:  I spent 12 minutes with the patient via telehealth technology discussing the above problems/concerns.    Montine Circle, PA-C

## 2022-12-13 NOTE — Patient Instructions (Signed)
[name], thank you for joining Montine Circle, PA-C for today's virtual visit.  While this provider is not your primary care provider (PCP), if your PCP is located in our provider database this encounter information will be shared with them immediately following your visit.   [facility] account gives you access to today's visit and all your visits, tests, and labs performed at [facility] " click here if you don't have a [facility] account or go to [email or web address]  Consent: (Patient) Carla Wise provided verbal consent for this virtual visit at the beginning of the encounter.  Current Medications:  Current Outpatient Medications:    benzonatate (TESSALON) 100 MG capsule, Take 1 capsule (100 mg total) by mouth 2 (two) times daily as needed for cough., Disp: 20 capsule, Rfl: 0   doxycycline (VIBRAMYCIN) 100 MG capsule, Take 1 capsule (100 mg total) by mouth 2 (two) times daily., Disp: 20 capsule, Rfl: 0   acetaminophen (TYLENOL) 325 MG tablet, Take 2 tablets (650 mg total) by mouth every 6 (six) hours as needed., Disp: , Rfl:    esomeprazole (NEXIUM) 40 MG capsule, Take 1 capsule by mouth 30 minutes before a meal once to twice daily for acid reflux (Patient taking differently: Take 40 mg by mouth in the morning and at bedtime.), Disp: 180 capsule, Rfl: 3   hydrocortisone (ANUSOL-HC) 2.5 % rectal cream, APPLY A SMALL PEA SIZED AMOUNT PER YOUR RECTUM 2 TO 4 TIMES DAILY FOR 10 TO 14 DAYS. (Patient taking differently: Place 1 Application rectally 4 (four) times daily as needed for anal itching or hemorrhoids.), Disp: 30 g, Rfl: 0   ibuprofen (ADVIL) 600 MG tablet, Take 1 tablet (600 mg total) by mouth every 6 (six) hours as needed., Disp: 30 tablet, Rfl: 0   lidocaine (XYLOCAINE) 5 % ointment, APPLY A SMALL PEA SIZED AMOUNT PER RECTUM TWICE DAILY FOR RECTAL PAIN RELATED TO HEMMORRHOIDS. (Patient taking differently: Apply 1 Application topically 2  (two) times daily as needed (pain/irritation.).), Disp: 35.44 g, Rfl: 0   linaclotide (LINZESS) 145 MCG CAPS capsule, Take 1 capsule (145 mcg total) by mouth daily before breakfast. (Patient not taking: Reported on 11/07/2022), Disp: 30 capsule, Rfl: 11   LOTEMAX SM 0.38 % GEL, Apply 1 drop to eye 4 (four) times daily., Disp: , Rfl:    meclizine (ANTIVERT) 25 MG tablet, Take 1 tablet (25 mg total) by mouth 2 (two) times daily as needed for dizziness or nausea., Disp: 30 tablet, Rfl: 1   multivitamin-iron-minerals-folic acid (CENTRUM) chewable tablet, Chew 1 tablet by mouth every evening., Disp: , Rfl:    Plecanatide (TRULANCE) 3 MG TABS, Take 3 mg by mouth daily as needed (constipation.)., Disp: , Rfl:    Medications ordered in this encounter:  Meds ordered this encounter  Medications   doxycycline (VIBRAMYCIN) 100 MG capsule    Sig: Take 1 capsule (100 mg total) by mouth 2 (two) times daily.    Dispense:  20 capsule    Refill:  0    Order Specific Question:   Supervising Provider    Answer:   Chase Picket [[id number]]   benzonatate (TESSALON) 100 MG capsule    Sig: Take 1 capsule (100 mg total) by mouth 2 (two) times daily as needed for cough.    Dispense:  20 capsule    Refill:  0    Order Specific Question:   Supervising Provider    Answer:   Chase Picket [id number]     *  If you need refills on other medications prior to your next appointment, please contact your pharmacy*  Follow-Up: Call back or seek an in-person evaluation if the symptoms worsen or if the condition fails to improve as anticipated.  [place] [phone]  Other Instructions    If you have been instructed to have an in-person evaluation today at a local Urgent Care facility, please use the link below. It will take you to a list of all of our available [facility], including address, phone number and hours of operation. Please do not delay care.  [facility]  If you or a family member do not have a primary care provider, use the link below to schedule a visit and establish care. When you choose a [facility] primary care physician or advanced practice provider, you gain a long-term partner in health. Find a Primary Care Provider  Learn more about [facility]'s in-office and virtual care options: [facility]

## 2022-12-14 ENCOUNTER — Encounter: Payer: Self-pay | Admitting: Family Medicine

## 2022-12-14 MED ORDER — MUPIROCIN 2 % EX OINT: TOPICAL_OINTMENT | CUTANEOUS | 0 refills | Status: DC

## 2022-12-14 NOTE — Telephone Encounter (Signed)
Nurses I would recommend Bactroban ointment 22 Gram  Apply thin amount twice daily to the area over the course of the next 7 days if progressive troubles or problems recommend follow-up visit

## 2022-12-21 ENCOUNTER — Encounter: Payer: Self-pay | Admitting: Obstetrics & Gynecology

## 2022-12-21 ENCOUNTER — Ambulatory Visit (INDEPENDENT_AMBULATORY_CARE_PROVIDER_SITE_OTHER): Payer: Managed Care, Other (non HMO) | Admitting: Obstetrics & Gynecology

## 2022-12-21 VITALS — BP 139/85 | HR 99 | Ht 61.0 in | Wt 98.4 lb

## 2022-12-21 DIAGNOSIS — R350 Frequency of micturition: Secondary | ICD-10-CM

## 2022-12-21 DIAGNOSIS — Z4889 Encounter for other specified surgical aftercare: Secondary | ICD-10-CM

## 2022-12-21 LAB — POCT URINALYSIS DIPSTICK
Blood, UA: NEGATIVE
Glucose, UA: NEGATIVE
Ketones, UA: NEGATIVE
Leukocytes, UA: NEGATIVE
Nitrite, UA: NEGATIVE
Protein, UA: NEGATIVE

## 2022-12-21 NOTE — Progress Notes (Signed)
    PostOp Visit Note  Carla Wise is a 57 y.o. No obstetric history on file. female who presents for a postoperative visit. She is 6 week postop following a LAVH, BSO completed on 12/6   Today she notes that she was had a cold immediately after surgery so recovery has been a little slower than anticipated.  For the past few days she has also noted urinary frequency and discomfort. Denies fever or chills.  Tolerating gen diet.  +Flatus, Regular BMs.  Pain is minimal - not taking any medication regularly. Overall doing well   Review of Systems Pertinent items are noted in HPI.    Objective:  BP 139/85 (BP Location: Left Arm, Patient Position: Sitting, Cuff Size: Normal)   Pulse 99   Ht '5\' 1"'$  (1.549 m)   Wt 98 lb 6.4 oz (44.6 kg)   LMP  (LMP Unknown)   BMI 18.59 kg/m    Physical Examination:  GENERAL ASSESSMENT: well developed and well nourished SKIN: normal color, no lesions CHEST: normal air exchange, respiratory effort normal with no retractions HEART: regular rate and rhythm ABDOMEN: soft, non-distended, incisions well-healed INCISION: well-healed, C/D/I GU: Vulva:  normal appearing vulva with no masses, tenderness or lesions Vagina:  normal mucosa, no discharge.  Suture present and removed.  Vaginal cuff intact, still healing Cervix:  absent Uterus:  uterus absent Adnexa: ovaries:not present,  no tenderness or abnormalities noted EXTREMITY: no edema, no calf tenderness bilaterally PSYCH: mood appropriate, normal affect       Assessment:    postop  Dysuria  Plan:   Meeting milestones appropriately.  Advised pelvic rest for another month UA and culture to r/o underlying infection, further management pending results F/U prn  Janyth Pupa, DO Attending [place], [place] for [facility], [place]

## 2022-12-23 LAB — URINE CULTURE: Organism ID, Bacteria: NO GROWTH

## 2022-12-31 ENCOUNTER — Encounter: Payer: Self-pay | Admitting: Obstetrics & Gynecology

## 2023-01-01 ENCOUNTER — Encounter: Payer: Self-pay | Admitting: *Deleted

## 2023-01-23 ENCOUNTER — Ambulatory Visit: Payer: Managed Care, Other (non HMO) | Admitting: Obstetrics & Gynecology

## 2023-01-23 ENCOUNTER — Encounter: Payer: Self-pay | Admitting: Obstetrics & Gynecology

## 2023-01-23 VITALS — BP 131/83 | HR 90 | Wt 100.4 lb

## 2023-01-23 DIAGNOSIS — Z4889 Encounter for other specified surgical aftercare: Secondary | ICD-10-CM

## 2023-01-23 DIAGNOSIS — N952 Postmenopausal atrophic vaginitis: Secondary | ICD-10-CM

## 2023-01-23 DIAGNOSIS — F419 Anxiety disorder, unspecified: Secondary | ICD-10-CM

## 2023-01-23 DIAGNOSIS — Z90710 Acquired absence of both cervix and uterus: Secondary | ICD-10-CM

## 2023-01-23 DIAGNOSIS — N941 Unspecified dyspareunia: Secondary | ICD-10-CM

## 2023-01-23 NOTE — Progress Notes (Signed)
   GYN VISIT Patient name: Carla Wise MRN [id number]  Date of birth: 1966-07-01 Chief Complaint:   Follow-up ("Want to be checked one more time")  History of Present Illness:   Carla Wise is a 57 y.o. PM, [place] female s/p LAVH/BSO completed 12/6 female being seen today for follow up.  At her last visit, vaginal cuff was still healing advised another 1-2wks of pelvic rest.  She presents today just to confirm that everything is well healed.  No pain, no bleeding no discharge.  No pelvic pain, no urinary concerns.  She does note some discomfort with sex, but this has been a long-standing issue (even prior to surgery)  No LMP recorded (lmp unknown). Patient has had a hysterectomy.     10/29/2022    1:17 PM 09/05/2022    3:37 PM 07/13/2022    8:20 AM 05/04/2022    9:52 AM 04/24/2021    3:35 PM  Depression screen PHQ 2/9  Decreased Interest 2 2 0 2 0  Down, Depressed, Hopeless 0 0 0 0 0  PHQ - 2 Score 2 2 0 2 0  Altered sleeping '3 3  3   '$ Tired, decreased energy '2 3  3   '$ Change in appetite 0 1  0   Feeling bad or failure about yourself  0 0  0   Trouble concentrating '1 1  1   '$ Moving slowly or fidgety/restless 0 0  0   Suicidal thoughts 0 0  0   PHQ-9 Score '8 10  9   '$ Difficult doing work/chores    Not difficult at all      Review of Systems:   Pertinent items are noted in HPI Denies fever/chills, dizziness, headaches, visual disturbances, fatigue, shortness of breath, chest pain, abdominal pain, vomiting, no problems with  bowel movements, urination, or intercourse unless otherwise stated above.  Pertinent History Reviewed:  Reviewed past medical,surgical, social, obstetrical and family history.  Reviewed problem list, medications and allergies. Physical Assessment:   Vitals:   01/23/23 1549  BP: 131/83  Pulse: 90  Weight: 100 lb 6.4 oz (45.5 kg)  Body mass index is 18.97 kg/m.       Physical Examination:   General appearance: alert, well appearing, and in no  distress  Psych: mood appropriate, normal affect  Skin: warm & dry   Cardiovascular: normal heart rate noted  Respiratory: normal respiratory effort, no distress  Abdomen: soft, non-tender, no rebound, no guarding.  Incisions well healed- C/D/I   Pelvic: VULVA: normal appearing vulva with no masses, tenderness or lesions, VAGINA: normal appearing vagina with normal color and discharge, no lesions.  Uterus and cervix surgically absent.  Vaginal cuff well healed both on speculum exam and on bimanual exam   Extremities: no edema   Chaperone:  pt dclined     Assessment & Plan:  1) Dyspareunia, vaginal atrophy Discussed conservative options as well as vaginal estrogen cream -declined treatment at this time -f/u if any change in symptoms  2) s/p hysterectomy Well healed, may return to regular activity including intercourse No further exams indicated.  Reviewed that pap is no longer needed  3) Breast cancer screening -pt encouraged to completed mammogram, order already placed   Return if symptoms worsen or fail to improve.   Janyth Pupa, DO Attending [place], [facility] for [employer], [place]

## 2023-02-07 ENCOUNTER — Encounter: Payer: Self-pay | Admitting: Radiology

## 2023-02-12 ENCOUNTER — Other Ambulatory Visit: Payer: Self-pay | Admitting: Family Medicine

## 2023-02-12 DIAGNOSIS — Z1231 Encounter for screening mammogram for malignant neoplasm of breast: Secondary | ICD-10-CM

## 2023-04-16 ENCOUNTER — Ambulatory Visit: Payer: Managed Care, Other (non HMO) | Admitting: Family Medicine

## 2023-04-16 VITALS — BP 110/78 | HR 90 | Ht 61.0 in | Wt 100.2 lb

## 2023-04-16 DIAGNOSIS — R5383 Other fatigue: Secondary | ICD-10-CM

## 2023-04-16 DIAGNOSIS — R634 Abnormal weight loss: Secondary | ICD-10-CM | POA: Diagnosis not present

## 2023-04-16 DIAGNOSIS — R42 Dizziness and giddiness: Secondary | ICD-10-CM

## 2023-04-16 NOTE — Progress Notes (Signed)
   Subjective:    Patient ID: Carla Wise, female    DOB: 12-06-1966, 57 y.o.   MRN: [id number]  HPI Patient arrives today with light headiness. Patient states she has blurry vision and light headiness.   Patient states that she is having a hard time knowing for certain what is going on at times she finds herself feeling like she cannot see as well other times she relates she feels lightheaded She states if she bends over and stands up she feels lightheaded In addition to this her energy level subpar She has lost some weight She is still currently a smoker  Review of Systems     Objective:   Physical Exam General-in no acute distress Eyes-no discharge Lungs-respiratory rate normal, CTA CV-no murmurs,RRR Extremities skin warm dry no edema Neuro grossly normal Behavior normal, alert  Patient has good motion and strength bilateral Blood pressure was checked laying sitting standing no appreciable changes     Assessment & Plan:  Fatigue Lightheadedness Weight loss Lab work ordered Doubt that this is a variant of vertigo but obviously if symptoms become more prevalent may have to do neurology consult or physical therapy for vestibular training await lab work  I find no evidence of stroke

## 2023-04-17 ENCOUNTER — Encounter: Payer: Self-pay | Admitting: Family Medicine

## 2023-04-17 LAB — COMPREHENSIVE METABOLIC PANEL WITH GFR
ALT: 6 [IU]/L (ref 0–32)
AST: 14 [IU]/L (ref 0–40)
Albumin/Globulin Ratio: 2.5 — ABNORMAL HIGH (ref 1.2–2.2)
Albumin: 4.8 g/dL (ref 3.8–4.9)
Alkaline Phosphatase: 98 [IU]/L (ref 44–121)
BUN/Creatinine Ratio: 14 (ref 9–23)
BUN: 10 mg/dL (ref 6–24)
Bilirubin Total: <0.2 mg/dL (ref 0.0–1.2)
CO2: 24 mmol/L (ref 20–29)
Calcium: 10.1 mg/dL (ref 8.7–10.2)
Chloride: 102 mmol/L (ref 96–106)
Creatinine, Ser: 0.73 mg/dL (ref 0.57–1.00)
Globulin, Total: 1.9 g/dL (ref 1.5–4.5)
Glucose: 89 mg/dL (ref 70–99)
Potassium: 4.2 mmol/L (ref 3.5–5.2)
Sodium: 141 mmol/L (ref 134–144)
Total Protein: 6.7 g/dL (ref 6.0–8.5)
eGFR: 96 mL/min/{1.73_m2}

## 2023-04-17 LAB — CBC WITH DIFFERENTIAL/PLATELET
Basophils Absolute: 0.1 10*3/uL (ref 0.0–0.2)
Basos: 2 %
EOS (ABSOLUTE): 0.2 10*3/uL (ref 0.0–0.4)
Eos: 3 %
Hematocrit: 43.6 % (ref 34.0–46.6)
Hemoglobin: 14.3 g/dL (ref 11.1–15.9)
Immature Grans (Abs): 0 10*3/uL (ref 0.0–0.1)
Immature Granulocytes: 0 %
Lymphocytes Absolute: 2.5 10*3/uL (ref 0.7–3.1)
Lymphs: 38 %
MCH: 29.1 pg (ref 26.6–33.0)
MCHC: 32.8 g/dL (ref 31.5–35.7)
MCV: 89 fL (ref 79–97)
Monocytes Absolute: 0.3 10*3/uL (ref 0.1–0.9)
Monocytes: 5 %
Neutrophils Absolute: 3.4 10*3/uL (ref 1.4–7.0)
Neutrophils: 52 %
Platelets: 258 10*3/uL (ref 150–450)
RBC: 4.91 x10E6/uL (ref 3.77–5.28)
RDW: 13.7 % (ref 11.7–15.4)
WBC: 6.5 10*3/uL (ref 3.4–10.8)

## 2023-04-17 LAB — TSH+FREE T4
Free T4: 1.16 ng/dL (ref 0.82–1.77)
TSH: 1.02 u[IU]/mL (ref 0.450–4.500)

## 2023-04-19 ENCOUNTER — Other Ambulatory Visit: Payer: Self-pay | Admitting: Family Medicine

## 2023-04-19 MED ORDER — TRAZODONE HCL 50 MG PO TABS: 25.0000 mg | ORAL_TABLET | Freq: Every evening | ORAL | 1 refills | Status: DC | PRN

## 2023-06-05 ENCOUNTER — Other Ambulatory Visit (HOSPITAL_COMMUNITY): Payer: Self-pay | Admitting: Family Medicine

## 2023-06-05 DIAGNOSIS — Z1231 Encounter for screening mammogram for malignant neoplasm of breast: Secondary | ICD-10-CM

## 2023-06-07 ENCOUNTER — Inpatient Hospital Stay
Admission: RE | Admit: 2023-06-07 | Discharge: 2023-06-07 | Disposition: A | Discharge status: Home / Self Care | Payer: Self-pay | Source: Ambulatory Visit | Attending: Family Medicine | Admitting: Family Medicine

## 2023-06-07 ENCOUNTER — Other Ambulatory Visit (HOSPITAL_COMMUNITY): Payer: Self-pay | Admitting: Family Medicine

## 2023-06-07 DIAGNOSIS — Z1231 Encounter for screening mammogram for malignant neoplasm of breast: Secondary | ICD-10-CM

## 2023-06-11 ENCOUNTER — Ambulatory Visit: Payer: Managed Care, Other (non HMO) | Admitting: Family Medicine

## 2023-06-12 ENCOUNTER — Encounter: Payer: Self-pay | Admitting: Family Medicine

## 2023-06-12 NOTE — Telephone Encounter (Signed)
Nurses-according to our referral nurse office visit would be necessary to document appropriately and place order so will be covered thank you please set up an office visit for her to see me before that time-July 15-or to see Eber Jones thank you  That way we can make sure we are clinically on top of what is going on plus also appropriately ordering right test thank you

## 2023-06-14 ENCOUNTER — Other Ambulatory Visit: Payer: Self-pay

## 2023-06-14 ENCOUNTER — Ambulatory Visit
Admission: RE | Admit: 2023-06-14 | Discharge: 2023-06-14 | Disposition: A | Discharge status: Home / Self Care | Payer: Managed Care, Other (non HMO) | Source: Ambulatory Visit | Attending: Nurse Practitioner

## 2023-06-14 VITALS — BP 121/83 | HR 94 | Temp 98.2°F | Resp 20

## 2023-06-14 DIAGNOSIS — R399 Unspecified symptoms and signs involving the genitourinary system: Secondary | ICD-10-CM | POA: Insufficient documentation

## 2023-06-14 LAB — POCT URINALYSIS DIP (MANUAL ENTRY)
Bilirubin, UA: NEGATIVE
Glucose, UA: NEGATIVE mg/dL
Ketones, POC UA: NEGATIVE mg/dL
Nitrite, UA: NEGATIVE
Protein Ur, POC: NEGATIVE mg/dL
Spec Grav, UA: 1.01 (ref 1.010–1.025)
Urobilinogen, UA: 0.2 E.U./dL
pH, UA: 6 (ref 5.0–8.0)

## 2023-06-14 MED ORDER — CEPHALEXIN 500 MG PO CAPS: 500.0000 mg | ORAL_CAPSULE | Freq: Two times a day (BID) | ORAL | 0 refills | Status: DC

## 2023-06-14 NOTE — ED Provider Notes (Signed)
[facility]    CSN: [id number] Arrival date & time: 06/14/23  1140      History   Chief Complaint Chief Complaint  Patient presents with   Urinary Frequency    Very uncomfortable when peeing, lot of pressure started wed - Entered by patient    HPI Carla Wise is a 57 y.o. female.   The history is provided by the patient.   The patient presents for complaints of urinary pressure, urgency, and pain with urination.  Symptoms have been present over the past several days.  Patient denies fever, chills, chest pain, abdominal pain, nausea, vomiting, diarrhea, hematuria, decreased urine stream, low back pain, or flank pain.  Patient denies history of recurrent UTI, kidney stones, and pyelonephritis.  Patient has not taken any medication for her symptoms.  Past Medical History:  Diagnosis Date   Abnormal Pap smear of vagina 2023   Barrett's esophagus    Constipation    Gastric adenoma 02/2017   GERD (gastroesophageal reflux disease)    PONV (postoperative nausea and vomiting)     Patient Active Problem List   Diagnosis Date Noted   CIN III (cervical intraepithelial neoplasia grade III) with severe dysplasia 11/14/2022   UTI (urinary tract infection) 10/29/2022   Dense breasts 07/13/2022   External hemorrhoid 01/23/2021   Barrett's esophagus 10/10/2020   Papanicolaou smear of cervix with positive 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 virus (HPV) test 09/23/2020   Insomnia 09/16/2020   Hyperlipidemia 09/16/2020   History of gastric polyp 01/28/2019   Gastroesophageal reflux disease 12/30/2018   Anxiety 06/18/2018   Adhesive capsulitis of left shoulder    Dyspepsia and disorder of function of stomach    Constipation 02/15/2017   Colon cancer screening 02/15/2017   Arthritis of knee, degenerative 03/09/2014   Medial meniscus, posterior horn derangement 08/02/2011    Past Surgical History:  Procedure Laterality Date   41 HOUR PH STUDY N/A 07/28/2020   Procedure: 24 HOUR  PH STUDY;  Surgeon: Shellia Cleverly, DO;   No evidence of pathologic gastroesophageal reflux.  Reflux symptoms slightly elevated, predominantly weekly.  Increase weekly acid reflux symptoms could be secondary to supragastric belching. Recommended trial of baclofen, not helpful   BALLOON DILATION N/A 03/17/2021   Procedure: BALLOON DILATION;  Surgeon: Lanelle Bal, DO;  [facility];  Service: Endoscopy;  Laterality: N/A;   BIOPSY  01/30/2019   Procedure: BIOPSY;  Surgeon: West Bali, MD;  [facility]: [facility];  Service: Endoscopy;;   BIOPSY  03/17/2021   Procedure: BIOPSY;  Surgeon: Lanelle Bal, DO;  [facility]: [facility];  Service: Endoscopy;;  gastric GEJ   COLONOSCOPY WITH PROPOFOL N/A 03/17/2021   Procedure: COLONOSCOPY WITH PROPOFOL;  Surgeon: Lanelle Bal, DO;  [facility]: [facility];  Service: Endoscopy;  Laterality: N/A;  PM   ESOPHAGEAL MANOMETRY N/A 07/27/2020   Procedure: ESOPHAGEAL MANOMETRY (EM);  Surgeon: Shellia Cleverly, DO;  Normal   ESOPHAGOGASTRODUODENOSCOPY N/A 03/01/2017   Dr. Darrick Penna: benign-appearing peptic stricture s/p dilation, gastric polyp (tubular adenoma), gastritis, duodenal diverticulm   ESOPHAGOGASTRODUODENOSCOPY N/A 01/30/2019   Procedure: ESOPHAGOGASTRODUODENOSCOPY (EGD);  Surgeon: West Bali, MD;  Benign-appearing esophageal stenosis, single 4 mm sessile polyp (hyperplastic), mild gastritis, normal examined duodenum (pathology- Peptic duodenitis)   ESOPHAGOGASTRODUODENOSCOPY (EGD) WITH PROPOFOL N/A 03/17/2021   Procedure: ESOPHAGOGASTRODUODENOSCOPY (EGD) WITH PROPOFOL;  Surgeon: Lanelle Bal, DO;  [facility];  Service: Endoscopy;  Laterality: N/A;   EXAM UNDER  ANESTHESIA WITH MANIPULATION OF SHOULDER Left 06/06/2017   Procedure: EXAM UNDER ANESTHESIA WITH MANIPULATION OF SHOULDER;  Surgeon: Vickki Hearing, MD;  Location: [facility];  Service: Orthopedics;  Laterality: Left;  full relaxation   FOOT SURGERY  Left 2003   hammer toe- great toe   KNEE ARTHROSCOPY W/ MENISCECTOMY     left knee   LAPAROSCOPIC VAGINAL HYSTERECTOMY WITH SALPINGO OOPHORECTOMY N/A 11/14/2022   Procedure: LAPAROSCOPIC ASSISTED VAGINAL HYSTERECTOMY WITH SALPINGO OOPHORECTOMY;  Surgeon: Myna Hidalgo, DO;  Location: [facility];  Service: Gynecology;  Laterality: N/A;   POLYPECTOMY  03/17/2021   Procedure: POLYPECTOMY;  Surgeon: Lanelle Bal, DO;  [facility];  Service: Endoscopy;;   SAVORY DILATION N/A 03/01/2017   Procedure: SAVORY DILATION;  Surgeon: West Bali, MD;  [facility];  Service: Endoscopy;  Laterality: N/A;   UPPER GASTROINTESTINAL ENDOSCOPY  05/20/2020   Dr. Barron Alvine: Benign stricture dilated 20 mm TTS balloon then fractured with forceps, 1 cm sliding-type HH, esophageal ulcer x2, Hill grade 3 valve, mild gastritis, no H. pylori.  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 Needs repeat EGD in 1 year.     OB History   No obstetric history on file.      Home Medications    Prior to Admission medications   Medication Sig Start Date End Date Taking? Authorizing Provider  cephALEXin (KEFLEX) 500 MG capsule Take 1 capsule (500 mg total) by mouth 2 (two) times daily for 7 days. 06/14/23 06/21/23 Yes Claryce Friel-Warren, Sadie Haber, NP  traZODone (DESYREL) 50 MG tablet Take 0.5-1 tablets (25-50 mg total) by mouth at bedtime as needed for sleep. 04/19/23   Babs Sciara, MD  acetaminophen (TYLENOL) 325 MG tablet Take 2 tablets (650 mg total) by mouth every 6 (six) hours as needed. 11/14/22   Myna Hidalgo, DO  benzonatate (TESSALON) 100 MG capsule Take 1 capsule (100 mg total) by mouth 2 (two) times daily as needed for cough. 12/13/22   Roxy Horseman, PA-C  doxycycline (VIBRAMYCIN) 100 MG capsule Take 1 capsule (100 mg total) by mouth 2 (two) times daily. 12/13/22   Roxy Horseman, PA-C  esomeprazole (NEXIUM) 40 MG capsule Take 1 capsule by mouth 30 minutes before a meal once to  twice daily for acid reflux 10/01/22   Tiffany Kocher, PA-C  hydrocortisone (ANUSOL-HC) 2.5 % rectal cream APPLY A SMALL PEA SIZED AMOUNT PER YOUR RECTUM 2 TO 4 TIMES DAILY FOR 10 TO 14 DAYS. Patient taking differently: As needed 09/06/21   Gelene Mink, NP  ibuprofen (ADVIL) 600 MG tablet Take 1 tablet (600 mg total) by mouth every 6 (six) hours as needed. 11/14/22   Myna Hidalgo, DO  lidocaine (XYLOCAINE) 5 % ointment APPLY A SMALL PEA SIZED AMOUNT PER RECTUM TWICE DAILY FOR RECTAL PAIN RELATED TO HEMMORRHOIDS. Patient taking differently: As needed 02/07/22   Lanelle Bal, DO  linaclotide [facility]) 145 MCG CAPS capsule Take 1 capsule (145 mcg total) by mouth daily before breakfast. 10/01/22   Tiffany Kocher, PA-C  LOTEMAX SM 0.38 % GEL Apply 1 drop to eye 4 (four) times daily. 10/22/22   [provider]  meclizine (ANTIVERT) 25 MG tablet Take 1 tablet (25 mg total) by mouth 2 (two) times daily as needed for dizziness or nausea. Patient taking differently: Take 25 mg by mouth 2 (two) times daily as needed for dizziness or nausea. As needed 09/06/22   [name], Alvino Chapel, FNP  multivitamin-iron-minerals-folic acid (CENTRUM)  chewable tablet Chew 1 tablet by mouth every evening.    [provider]  mupirocin ointment (BACTROBAN) 2 % Apply thin amount twice daily to affected area over the next 7 days Patient taking differently: Apply thin amount twice daily to affected area over the next 7 days as needed 12/14/22   Luking, Jonna Coup, MD  Plecanatide (TRULANCE) 3 MG TABS Take 3 mg by mouth daily as needed (constipation.). As needed    [provider]    Family History Family History  Problem Relation Age of Onset   Heart disease Other    Cancer Other    Colon cancer Neg Hx    Esophageal cancer Neg Hx    Rectal cancer Neg Hx    Stomach cancer Neg Hx     Social History Social History   Tobacco Use   Smoking status: Every Day    Packs/day: 0.25    Years: 25.00     Additional pack years: 0.00    Total pack years: 6.25    Types: Cigarettes   Smokeless tobacco: Never   Tobacco comments:    Smokes 5 cigs daily  Vaping Use   Vaping Use: Never used  Substance Use Topics   Alcohol use: No   Drug use: No     Allergies   Azithromycin and Levaquin [levofloxacin in d5w]   Review of Systems Review of Systems Per HPI  Physical Exam Triage Vital Signs ED Triage Vitals  Enc Vitals Group     BP 06/14/23 1217 121/83     Pulse Rate 06/14/23 1217 94     Resp 06/14/23 1217 20     Temp 06/14/23 1217 98.2 F (36.8 C)     Temp Source 06/14/23 1217 Oral     SpO2 06/14/23 1217 96 %     Weight --      Height --      Head Circumference --      Peak Flow --      Pain Score 06/14/23 1216 8     Pain Loc --      Pain Edu? --      Excl. in GC? --    No data found.  Updated Vital Signs BP 121/83 (BP Location: Right Arm)   Pulse 94   Temp 98.2 F (36.8 C) (Oral)   Resp 20   LMP  (LMP Unknown)   SpO2 96%   Visual Acuity Right Eye Distance:   Left Eye Distance:   Bilateral Distance:    Right Eye Near:   Left Eye Near:    Bilateral Near:     Physical Exam Vitals and nursing note reviewed.  Constitutional:      General: She is not in acute distress.    Appearance: Normal appearance.  HENT:     Head: Normocephalic.  Eyes:     Extraocular Movements: Extraocular movements intact.     Pupils: Pupils are equal, round, and reactive to light.  Cardiovascular:     Rate and Rhythm: Normal rate and regular rhythm.     Pulses: Normal pulses.     Heart sounds: Normal heart sounds.  Pulmonary:     Effort: Pulmonary effort is normal. No respiratory distress.     Breath sounds: Normal breath sounds. No stridor. No wheezing, rhonchi or rales.  Abdominal:     General: Abdomen is flat. Bowel sounds are normal. There is no distension.     Palpations: Abdomen is soft.  Tenderness: There is abdominal tenderness in the suprapubic area.   Musculoskeletal:     Cervical back: Normal range of motion.  Lymphadenopathy:     Cervical: No cervical adenopathy.  Skin:    General: Skin is warm and dry.  Neurological:     General: No focal deficit present.     Mental Status: She is alert and oriented to person, place, and time.  Psychiatric:        Mood and Affect: Mood normal.        Behavior: Behavior normal.      UC Treatments / Results  Labs (all labs ordered are listed, but only abnormal results are displayed) Labs Reviewed  POCT URINALYSIS DIP (MANUAL ENTRY) - Abnormal; Notable for the following components:      Result Value   Clarity, UA hazy (*)    Blood, UA large (*)    Leukocytes, UA Moderate (2+) (*)    All other components within normal limits    EKG   Radiology No results found.  Procedures Procedures (including critical care time)  Medications Ordered in UC Medications - No data to display  Initial Impression / Assessment and Plan / UC Course  I have reviewed the triage vital signs and the nursing notes.  Pertinent labs & imaging results that were available during my care of the patient were reviewed by me and considered in my medical decision making (see chart for details).  The patient is well-appearing, she is in no acute distress, vital signs are stable.  Urinalysis is positive for moderate leukocytes and blood.  Based on the patient's symptoms and urinalysis, will treat empirically with Keflex 500 mg twice daily for a UTI.  Urine culture is pending.  Patient was advised if the urine culture result is negative, she will be contacted and advised to stop the antibiotic.  Supportive care recommendations were provided and discussed with the patient to include increasing fluids, use of over-the-counter analgesics for pain or discomfort, and developing a toileting schedule while symptoms persist.  Patient was given strict ER follow-up precautions.  Patient was in agreement with this plan of care and  verbalizes understanding.  All questions were answered.  Patient stable for discharge.  Final Clinical Impressions(s) / UC Diagnoses   Final diagnoses:  UTI symptoms     Discharge Instructions      The urinalysis was positive for leukocytes and blood.  A urine culture has been ordered.  I am treating you with an antibiotic today; however, if the urine culture result is negative, you will be contacted and advised to stop the antibiotic. Take medication as prescribed. Increase fluids.  Try to drink at least 8-10 8 ounce glass of water while symptoms persist. Develop a toileting schedule that will allow you to urinate every 2 hours. Avoid caffeine such as tea, soda, and coffee while symptoms persist. If you are sexually active, please urinate at least 15 to 20 minutes after intercourse. If you continue to experience symptoms, and your urine culture is negative, please follow-up with your primary care physician for further evaluation. If you are experience worsening symptoms with fever, chills, or other concerns, please follow-up in the emergency department immediately for further evaluation. Follow-up as needed.     ED Prescriptions     Medication Sig Dispense Auth. Provider   cephALEXin (KEFLEX) 500 MG capsule Take 1 capsule (500 mg total) by mouth 2 (two) times daily for 7 days. 14 capsule Bethel Gaglio-Warren, Sadie Haber, NP  PDMP not reviewed this encounter.   Abran Cantor, NP 06/14/23 1243

## 2023-06-14 NOTE — ED Triage Notes (Signed)
Pt reports urinary pressure, dysuria since Tuesday. Pt denies any fever,abd pain.

## 2023-06-14 NOTE — Discharge Instructions (Signed)
The urinalysis was positive for leukocytes and blood.  A urine culture has been ordered.  I am treating you with an antibiotic today; however, if the urine culture result is negative, you will be contacted and advised to stop the antibiotic. Take medication as prescribed. Increase fluids.  Try to drink at least 8-10 8 ounce glass of water while symptoms persist. Develop a toileting schedule that will allow you to urinate every 2 hours. Avoid caffeine such as tea, soda, and coffee while symptoms persist. If you are sexually active, please urinate at least 15 to 20 minutes after intercourse. If you continue to experience symptoms, and your urine culture is negative, please follow-up with your primary care physician for further evaluation. If you are experience worsening symptoms with fever, chills, or other concerns, please follow-up in the emergency department immediately for further evaluation. Follow-up as needed.

## 2023-06-15 LAB — URINE CULTURE: Culture: NO GROWTH

## 2023-06-17 ENCOUNTER — Ambulatory Visit: Payer: Managed Care, Other (non HMO) | Admitting: Nurse Practitioner

## 2023-06-17 ENCOUNTER — Encounter: Payer: Self-pay | Admitting: Nurse Practitioner

## 2023-06-17 VITALS — BP 112/70 | HR 87 | Temp 98.2°F | Ht 61.0 in | Wt 100.0 lb

## 2023-06-17 DIAGNOSIS — R300 Dysuria: Secondary | ICD-10-CM | POA: Diagnosis not present

## 2023-06-17 LAB — POCT URINALYSIS DIP (CLINITEK)
Bilirubin, UA: NEGATIVE
Blood, UA: NEGATIVE
Glucose, UA: NEGATIVE mg/dL
Ketones, POC UA: NEGATIVE mg/dL
Nitrite, UA: NEGATIVE
POC PROTEIN,UA: NEGATIVE
Spec Grav, UA: 1.015 (ref 1.010–1.025)
Urobilinogen, UA: 0.2 E.U./dL
pH, UA: 6.5 (ref 5.0–8.0)

## 2023-06-17 NOTE — Progress Notes (Signed)
   Subjective:    Patient ID: Carla Wise, female    DOB: 1966-02-05, 57 y.o.   MRN: [id number]  HPI Presents for recheck following a recent visit to ED on 7/5 for UTI symptoms.  Was started on Keflex at that time but once her culture came back negative she was told to discontinue her antibiotics.  This is the second urine culture she has had this year and both are negative.  At this time denies any pelvic pressure.  Married, same sexual partner. Had TVH with BSO  back in December.  Denies vaginal discharge.  Some dyspareunia with intercourse.  Has had an area of tenderness in her right breast for several years.  No change in the size.  Has not identified any specific things that aggravate the area.  Has been getting her mammograms at [facility].  Would like to switch back to [facility].  Unsure about the type of mammogram that she needs.  Denies any family history of breast cancer.  Review of Systems  Constitutional:  Negative for fever.  Respiratory:  Negative for cough, chest tightness, shortness of breath and wheezing.   Cardiovascular:  Negative for chest pain.  Genitourinary:  Positive for dyspareunia and dysuria. Negative for difficulty urinating, enuresis, flank pain, frequency, genital sores, pelvic pain, urgency, vaginal bleeding and vaginal discharge.       Objective:   Physical Exam NAD.  Alert, oriented.  Lungs clear.  Heart regular rate rhythm.  No CVA or flank tenderness on exam.  Abdomen soft nondistended nontender, no suprapubic area tenderness noted on exam. Breast exam: Dense tissue bilaterally, no lymphadenopathy noted.  2 cm flat density noted at approximately 3:00 about 1 cm from the areola tender to palpation.  There are several similar areas in the left breast between 9 and 12:00.  No dominant masses are noted.  No nipple discharge. Today's Vitals   06/17/23 1550  BP: 112/70  Pulse: 87  Temp: 98.2 F (36.8 C)  SpO2: 98%  Weight: 100 lb (45.4 kg)  Height: 5\' 1"  (1.549 m)    Body mass index is 18.89 kg/m. Results for orders placed or performed in visit on 06/17/23  POCT URINALYSIS DIP (CLINITEK)  Result Value Ref Range   Color, UA yellow yellow   Clarity, UA clear clear   Glucose, UA negative negative mg/dL   Bilirubin, UA negative negative   Ketones, POC UA negative negative mg/dL   Spec Grav, UA 5.621 3.086 - 1.025   Blood, UA negative negative   pH, UA 6.5 5.0 - 8.0   POC PROTEIN,UA negative negative, trace   Urobilinogen, UA 0.2 0.2 or 1.0 E.U./dL   Nitrite, UA Negative Negative   Leukocytes, UA Trace (A) Negative          Assessment & Plan:   Problem List Items Addressed This Visit   None Visit Diagnoses     Dysuria    -  Primary   Relevant Orders   POCT URINALYSIS DIP (CLINITEK) (Completed)      With both cultures being negative, and no further blood being noted in the urine along with improvement of leukocytes, no further testing needed at this time.  Recommend patient follow-up in August for yearly physical and pelvic exam at that time. Patient plans to schedule a screening mammogram at local hospital, will schedule this herself.  Based on those findings, patient may need further testing.

## 2023-06-24 ENCOUNTER — Ambulatory Visit (HOSPITAL_COMMUNITY): Payer: Managed Care, Other (non HMO)

## 2023-06-27 ENCOUNTER — Encounter (HOSPITAL_COMMUNITY): Payer: Self-pay

## 2023-06-27 ENCOUNTER — Other Ambulatory Visit (HOSPITAL_COMMUNITY): Payer: Self-pay | Admitting: Family Medicine

## 2023-06-27 ENCOUNTER — Ambulatory Visit (HOSPITAL_COMMUNITY)
Admission: RE | Admit: 2023-06-27 | Discharge: 2023-06-27 | Disposition: A | Discharge status: Home / Self Care | Payer: Managed Care, Other (non HMO) | Source: Ambulatory Visit

## 2023-06-27 DIAGNOSIS — Z1231 Encounter for screening mammogram for malignant neoplasm of breast: Secondary | ICD-10-CM

## 2023-07-26 ENCOUNTER — Ambulatory Visit (INDEPENDENT_AMBULATORY_CARE_PROVIDER_SITE_OTHER): Payer: Managed Care, Other (non HMO) | Admitting: Nurse Practitioner

## 2023-07-26 VITALS — BP 113/74 | HR 85 | Wt 101.2 lb

## 2023-07-26 DIAGNOSIS — Z13220 Encounter for screening for lipoid disorders: Secondary | ICD-10-CM | POA: Diagnosis not present

## 2023-07-26 DIAGNOSIS — F419 Anxiety disorder, unspecified: Secondary | ICD-10-CM | POA: Diagnosis not present

## 2023-07-26 DIAGNOSIS — R5383 Other fatigue: Secondary | ICD-10-CM | POA: Diagnosis not present

## 2023-07-26 MED ORDER — ESCITALOPRAM OXALATE 10 MG PO TABS: 10.0000 mg | ORAL_TABLET | Freq: Every day | ORAL | 0 refills | Status: DC

## 2023-07-26 NOTE — Progress Notes (Unsigned)
Subjective:    Patient ID: Carla Wise, female    DOB: [birth year], 57 y.o.   MRN: [id number]  HPI Presents for recheck.  Has been under more stress lately.  Working 2 jobs 6 to 7 days a week.  Finds herself being very short tempered and easily agitated.  Also extreme fatigue.  Requesting a recheck of her B12 and iron levels.  No increase in tobacco use, smokes 4 to 5 cigarettes/day maximum.    07/26/2023    3:20 PM  Depression screen PHQ 2/9  Decreased Interest 2  PHQ - 2 Score 2  Altered sleeping 3  Tired, decreased energy 3  Trouble concentrating 2  Moving slowly or fidgety/restless 3  PHQ-9 Score 13  Difficult doing work/chores Extremely dIfficult      07/26/2023    3:30 PM 06/17/2023    4:18 PM 04/16/2023    3:56 PM 10/29/2022    1:17 PM  GAD 7 : Generalized Anxiety Score  Nervous, Anxious, on Edge 3 3 3 3   Control/stop worrying 3 1 2 1   Worry too much - different things 3 1 3 1   Trouble relaxing 3 3 3 3   Restless 3 1 2 3   Easily annoyed or irritable 3 3 3 3   Afraid - awful might happen 3 0 1 0  Total GAD 7 Score 21 12 17 14   Anxiety Difficulty Extremely difficult Extremely difficult           Review of Systems  Constitutional:  Positive for fatigue.  HENT:  Negative for sore throat and trouble swallowing.   Respiratory:  Negative for cough, chest tightness and shortness of breath.   Cardiovascular:  Negative for chest pain, palpitations and leg swelling.  Psychiatric/Behavioral:  Positive for dysphoric mood. The patient is nervous/anxious.        Objective:   Physical Exam NAD.  Alert, oriented.  Fatigued in appearance.  Speech clear.  Making good eye contact.  Dressed appropriately for the weather.  Thoughts logical coherent and relevant.  Thyroid nontender to palpation, no mass or goiter noted.  Lungs clear.  Heart regular rate rhythm.  Lower extremities no edema. Today's Vitals   07/26/23 1540  BP: 113/74  Pulse: 85  SpO2: 96%  Weight: 101 lb 3.2 oz  (45.9 kg)   Body mass index is 19.12 kg/m.        Assessment & Plan:   Problem List Items Addressed This Visit       Other   Anxiety   Relevant Medications   escitalopram (LEXAPRO) 10 MG tablet   Other Visit Diagnoses     Fatigue, unspecified type    -  Primary   Relevant Orders   Vitamin B12   Iron, TIBC and Ferritin Panel   Screening for lipid disorders       Relevant Orders   Lipid panel      Meds ordered this encounter  Medications   escitalopram (LEXAPRO) 10 MG tablet    Sig: Take 1 tablet (10 mg total) by mouth daily.    Dispense:  30 tablet    Refill:  0    Order Specific Question:   Supervising Provider    Answer:   Lilyan Punt A [[id number]]   Labs pending. Discussed importance of stress reduction.  Start on escitalopram 10 mg daily.  Reviewed potential adverse effects.  Discontinue medication and contact office if any problems. Recommend follow-up in about 1 month.  Recommend preventive health physical.

## 2023-07-27 ENCOUNTER — Encounter: Payer: Self-pay | Admitting: Nurse Practitioner

## 2023-08-10 LAB — LIPID PANEL
Chol/HDL Ratio: 5.3 ratio — ABNORMAL HIGH (ref 0.0–4.4)
Cholesterol, Total: 189 mg/dL (ref 100–199)
HDL: 36 mg/dL — ABNORMAL LOW (ref >39)
LDL Chol Calc (NIH): 125 mg/dL — ABNORMAL HIGH (ref 0–99)
Triglycerides: 158 mg/dL — ABNORMAL HIGH (ref 0–149)
VLDL Cholesterol Cal: 28 mg/dL (ref 5–40)

## 2023-08-10 LAB — IRON,TIBC AND FERRITIN PANEL
Ferritin: 80 ng/mL (ref 15–150)
Iron Saturation: 25 % (ref 15–55)
Iron: 64 ug/dL (ref 27–159)
Total Iron Binding Capacity: 252 ug/dL (ref 250–450)
UIBC: 188 ug/dL (ref 131–425)

## 2023-08-10 LAB — VITAMIN B12: Vitamin B-12: 555 pg/mL (ref 232–1245)

## 2023-08-11 ENCOUNTER — Encounter: Payer: Self-pay | Admitting: Nurse Practitioner

## 2023-08-14 ENCOUNTER — Other Ambulatory Visit: Payer: Self-pay | Admitting: Nurse Practitioner

## 2023-08-14 MED ORDER — SERTRALINE HCL 50 MG PO TABS: 50.0000 mg | ORAL_TABLET | Freq: Every day | ORAL | 0 refills | Status: DC

## 2023-08-15 ENCOUNTER — Encounter: Payer: Self-pay | Admitting: Gastroenterology

## 2023-09-06 ENCOUNTER — Ambulatory Visit (INDEPENDENT_AMBULATORY_CARE_PROVIDER_SITE_OTHER): Payer: Managed Care, Other (non HMO) | Admitting: Nurse Practitioner

## 2023-09-06 ENCOUNTER — Encounter: Payer: Self-pay | Admitting: Nurse Practitioner

## 2023-09-06 ENCOUNTER — Encounter: Payer: Managed Care, Other (non HMO) | Admitting: Family Medicine

## 2023-09-06 VITALS — BP 117/76 | HR 84 | Temp 97.7°F | Ht 61.0 in | Wt 99.0 lb

## 2023-09-06 DIAGNOSIS — F419 Anxiety disorder, unspecified: Secondary | ICD-10-CM

## 2023-09-06 DIAGNOSIS — Z01419 Encounter for gynecological examination (general) (routine) without abnormal findings: Secondary | ICD-10-CM

## 2023-09-06 DIAGNOSIS — Z01411 Encounter for gynecological examination (general) (routine) with abnormal findings: Secondary | ICD-10-CM | POA: Diagnosis not present

## 2023-09-06 NOTE — Patient Instructions (Signed)
TDap or Tetanus vaccine

## 2023-09-06 NOTE — Progress Notes (Unsigned)
Subjective:    Patient ID: Carla Wise, female    DOB: [birth year], 57 y.o.   MRN: [id number]  HPI The patient comes in today for a wellness visit.    A review of their health history was completed.  A review of medications was also completed.  Any needed refills; no  Eating habits: good  Falls/  MVA accidents in past few months: no  Regular exercise: walking a lot at work  Specialist pt sees on regular basis: gastroenterologist  Preventative health issues were discussed.   Additional concerns: problem sleeping - trazadone not help  Same sexual partner. 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 Light smoker about 3-5 cigarettes per day. Regular dental and vision exams. Colonoscopy 2022. Mammogram 06/27/23. Class D dense breasts. Tyrer Cusick lifetime score 8.2% August 2023. Seen for anxiety on 8/16. Started Sertraline about a week ago. Now on 25 mg but plans to increase to 50 mg.   Review of Systems  Constitutional:  Positive for fatigue. Negative for activity change and appetite change.  HENT:  Negative for sore throat and trouble swallowing.   Respiratory:  Negative for cough, chest tightness, shortness of breath and wheezing.   Cardiovascular:  Negative for chest pain.  Gastrointestinal:  Negative for abdominal distention, abdominal pain, constipation, diarrhea, nausea and vomiting.  Genitourinary:  Negative for difficulty urinating, dysuria, enuresis, frequency, genital sores, pelvic pain, urgency and vaginal discharge.  Psychiatric/Behavioral:  Positive for sleep disturbance. Negative for suicidal ideas. The patient is nervous/anxious.        Objective:   Physical Exam Vitals and nursing note reviewed.  Constitutional:      General: She is not in acute distress.    Appearance: She is well-developed.  Neck:     Thyroid: No thyromegaly.     Trachea: No tracheal deviation.     Comments: Thyroid non tender to palpation. No mass or goiter  noted.  Cardiovascular:     Rate and Rhythm: Normal rate and regular rhythm.     Heart sounds: Normal heart sounds. No murmur heard. Pulmonary:     Effort: Pulmonary effort is normal.     Breath sounds: Normal breath sounds.  Chest:  Breasts:    Right: No swelling, inverted nipple, mass, skin change or tenderness.     Left: No swelling, inverted nipple, mass, skin change or tenderness.  Abdominal:     General: There is no distension.     Palpations: Abdomen is soft.     Tenderness: There is no abdominal tenderness.  Genitourinary:    Comments: Defers GU exam at this time; denies any problems.  Musculoskeletal:     Cervical back: Normal range of motion and neck supple.  Lymphadenopathy:     Cervical: No cervical adenopathy.     Upper Body:     Right upper body: No supraclavicular, axillary or pectoral adenopathy.     Left upper body: No supraclavicular, axillary or pectoral adenopathy.  Skin:    General: Skin is warm and dry.     Findings: No rash.  Neurological:     Mental Status: She is alert and oriented to person, place, and time.  Psychiatric:        Mood and Affect: Mood normal.        Behavior: Behavior normal.        Thought Content: Thought content normal.        Judgment: Judgment normal.    Today's Vitals  09/06/23 1302  BP: 117/76  Pulse: 84  Temp: 97.7 F (36.5 C)  SpO2: 97%  Weight: 99 lb (44.9 kg)  Height: 5\' 1"  (1.549 m)   Body mass index is 18.71 kg/m. Results for orders placed or performed in visit on 07/26/23  Lipid panel  Result Value Ref Range   Cholesterol, Total 189 100 - 199 mg/dL   Triglycerides 865 (H) 0 - 149 mg/dL   HDL 36 (L) >78 mg/dL   VLDL Cholesterol Cal 28 5 - 40 mg/dL   LDL Chol Calc (NIH) 469 (H) 0 - 99 mg/dL   Chol/HDL Ratio 5.3 (H) 0.0 - 4.4 ratio  Vitamin B12  Result Value Ref Range   Vitamin B-12 555 232 - 1,245 pg/mL  Iron, TIBC and Ferritin Panel  Result Value Ref Range   Total Iron Binding Capacity 252 250 - 450  ug/dL   UIBC 629 528 - 413 ug/dL   Iron 64 27 - 244 ug/dL   Iron Saturation 25 15 - 55 %   Ferritin 80 15 - 150 ng/mL           Assessment & Plan:  1. Well woman exam Continue to limit tobacco use. Does not feel she can quit at this time due to her anxiety. Follow with GI next year due to history of Barrett esophagus.  Limit processed foods and saturated fats. Reviewed labs including lipid profile.    2. Anxiety Continue Sertraline with increased dose to 50 mg. Contact office to let us know how this dose is doing so refills can be sent in.  Discussed sleep hygiene.  Return in about 6 months (around 03/05/2024). Call back sooner if needed.

## 2023-09-08 ENCOUNTER — Encounter: Payer: Self-pay | Admitting: Nurse Practitioner

## 2023-09-24 ENCOUNTER — Encounter: Payer: Self-pay | Admitting: Gastroenterology

## 2023-09-24 ENCOUNTER — Ambulatory Visit: Payer: Managed Care, Other (non HMO) | Admitting: Gastroenterology

## 2023-09-24 VITALS — BP 114/72 | HR 79 | Temp 98.6°F | Ht 62.0 in | Wt 100.8 lb

## 2023-09-24 DIAGNOSIS — Z8719 Personal history of other diseases of the digestive system: Secondary | ICD-10-CM | POA: Diagnosis not present

## 2023-09-24 DIAGNOSIS — K5900 Constipation, unspecified: Secondary | ICD-10-CM

## 2023-09-24 DIAGNOSIS — K219 Gastro-esophageal reflux disease without esophagitis: Secondary | ICD-10-CM | POA: Diagnosis not present

## 2023-09-24 DIAGNOSIS — K2100 Gastro-esophageal reflux disease with esophagitis, without bleeding: Secondary | ICD-10-CM

## 2023-09-24 MED ORDER — ESOMEPRAZOLE MAGNESIUM 40 MG PO CPDR
DELAYED_RELEASE_CAPSULE | ORAL | 3 refills | Status: DC
Start: 2023-09-24 — End: 2024-04-27

## 2023-09-24 MED ORDER — LINACLOTIDE 145 MCG PO CAPS: 145.0000 ug | ORAL_CAPSULE | Freq: Every day | ORAL | 11 refills | Status: DC

## 2023-09-24 NOTE — Patient Instructions (Addendum)
I have sent in Linzess to [place] pharmacy to see what the coverage looks like.  We have provided you samples to take 1 daily as needed in the meantime. Stop Trulance once you start Linzess.  Do not take both on the same days. Continue esomeprazole 40 mg once to twice daily as needed for reflux.  I sent refills to [facility]. Return office visit in one year or sooner if needed.

## 2023-09-24 NOTE — Progress Notes (Signed)
GI Office Note    Referring Provider: Babs Sciara, MD Primary Care Physician:  Babs Sciara, MD  Primary Gastroenterologist: Hennie Duos. Marletta Lor, DO   Chief Complaint   Chief Complaint  Patient presents with   Follow-up    Needs refills on Trulance and esomeprazole.    History of Present Illness   Carla Wise is a 57 y.o. female presenting today for follow-up.  Last seen October 2023.  She has a history of Barrett's esophagus, constipation, gastric adenoma (2018), GERD.  Doing fairly well.  Prefers Linzess over [employer].  Insurance stopped paying at some point.  She takes Trulance about 3-4 times per week, sometimes doubles up on the dose.  Having a bowel movement about once per week.  Has bloating and gas.  No abdominal pain.  No melena or rectal bleeding.  Heartburn well-controlled on esomeprazole once to twice daily.  No dysphagia.  EGD 03/2021: -Mild Schatzki ring. Dilated. Biopsied. 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 -Gastritis. Biopsied. Unremarkable. No h.pylori -Normal duodenal bulb, first portion of the duodenum and second portion of the duodenum. -Surveillance EGD in 5 years given prior history of Barrett's   Colonoscopy 03/2021: -Non-bleeding internal hemorrhoids. -Diverticulosis in the sigmoid colon. -One 11 mm polyp at the hepatic flexure, removed with mucosal resection. Resected and retrieved. Sessile serrate polyp without cytologic dysplasia -One 4 mm polyp in the transverse colon, removed with a cold snare. Resected and retrieved. Sessile serrate polyps without cytologic dysplasia -The examination was otherwise normal. -Mucosal resection was performed. Resection and retrieval were complete.   Previous work up with Dr. 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 No H. Pylori on pathology.  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 Needs repeat EGD in 1 year.  Manometry 07/27/20: Normal.  Ph/impeedance 07/28/20: No evidence of pathologic gastroesophageal reflux.  Reflux symptoms slightly elevated, predominantly weekly.  Increase weekly acid reflux symptoms could be secondary to supragastric belching. Recommended trial of baclofen.  Follow-up telemedicine visit 08/16/20. Patient preferred medication management rather than antireflux surgery. She was started on baclofen 5 mg. Stated she can titrate up to TID as tolerated and depending on efficacy. Resume omeprazole. Follow-up prn.    Medications   Current Outpatient Medications  Medication Sig Dispense Refill   acetaminophen (TYLENOL) 325 MG tablet Take 2 tablets (650 mg total) by mouth every 6 (six) hours as needed.     cycloSPORINE (RESTASIS) 0.05 % ophthalmic emulsion SMARTSIG:In Eye(s)     esomeprazole (NEXIUM) 40 MG capsule Take 1 capsule by mouth 30 minutes before a meal once to twice daily for acid reflux 180 capsule 3   hydrocortisone (ANUSOL-HC) 2.5 % rectal cream APPLY A SMALL PEA SIZED AMOUNT PER YOUR RECTUM 2 TO 4 TIMES DAILY FOR 10 TO 14 DAYS. (Patient taking differently: As needed) 30 g 0   lidocaine (XYLOCAINE) 5 % ointment APPLY A SMALL PEA SIZED AMOUNT PER RECTUM TWICE DAILY FOR RECTAL PAIN RELATED TO HEMMORRHOIDS. (Patient taking differently: As needed) 35.44 g 0   meclizine (ANTIVERT) 25 MG tablet Take 1 tablet (25 mg total) by mouth 2 (two) times daily as needed for dizziness or nausea. (Patient taking differently: Take 25 mg by mouth 2 (two) times daily as needed for dizziness or nausea. As needed) 30 tablet 1  mupirocin ointment (BACTROBAN) 2 % Apply thin amount twice daily to affected area over the next 7 days (Patient taking differently: Apply thin amount twice daily to affected area over the next 7 days as needed) 22 g 0   Plecanatide  (TRULANCE) 3 MG TABS Take 3 mg by mouth daily as needed (constipation.). As needed     sertraline (ZOLOFT) 50 MG tablet Take 1 tablet (50 mg total) by mouth daily. 30 tablet 0   No current facility-administered medications for this visit.    Allergies   Allergies as of 09/24/2023 - Review Complete 09/24/2023  Allergen Reaction Noted   Azithromycin  11/23/2013   Levaquin [levofloxacin in d5w]  02/04/2015   Lexapro [escitalopram oxalate] Other (See Comments) 08/14/2023     Review of Systems   General: Negative for anorexia, weight loss, fever, chills, fatigue, weakness. ENT: Negative for hoarseness, difficulty swallowing , nasal congestion. CV: Negative for chest pain, angina, palpitations, dyspnea on exertion, peripheral edema.  Respiratory: Negative for dyspnea at rest, dyspnea on exertion, cough, sputum, wheezing.  GI: See history of present illness. GU:  Negative for dysuria, hematuria, urinary incontinence, urinary frequency, nocturnal urination.  Endo: Negative for unusual weight change.     Physical Exam   BP 114/72 (BP Location: Right Arm, Patient Position: Sitting, Cuff Size: Normal)   Pulse 79   Temp 98.6 F (37 C) (Oral)   Ht 5\' 2"  (1.575 m)   Wt 100 lb 12.8 oz (45.7 kg)   LMP  (LMP Unknown)   SpO2 98%   BMI 18.44 kg/m    General: Well-nourished, well-developed in no acute distress.  Eyes: No icterus. Mouth: Oropharyngeal mucosa moist and pinkAbdomen: Bowel sounds are normal, nontender, nondistended, no hepatosplenomegaly or masses,  no abdominal bruits or hernia , no rebound or guarding.  Rectal: Not performed Extremities: No lower extremity edema. No clubbing or deformities. Neuro: Alert and oriented x 4   Skin: Warm and dry, no jaundice.   Psych: Alert and cooperative, normal mood and affect.  Labs   Lab Results  Component Value Date   IRON 64 08/09/2023   TIBC 252 08/09/2023   FERRITIN 80 08/09/2023   Lab Results  Component Value Date   NA 141  04/16/2023   CL 102 04/16/2023   K 4.2 04/16/2023   CO2 24 04/16/2023   BUN 10 04/16/2023   CREATININE 0.73 04/16/2023   EGFR 96 04/16/2023   CALCIUM 10.1 04/16/2023   ALBUMIN 4.8 04/16/2023   GLUCOSE 89 04/16/2023   Lab Results  Component Value Date   ALT 6 04/16/2023   AST 14 04/16/2023   ALKPHOS 98 04/16/2023   BILITOT <0.2 04/16/2023   Lab Results  Component Value Date   WBC 6.5 04/16/2023   HGB 14.3 04/16/2023   HCT 43.6 04/16/2023   MCV 89 04/16/2023   PLT 258 04/16/2023   Lab Results  Component Value Date   TSH 1.020 04/16/2023    Imaging Studies   No results found.  Assessment/Plan:   GERD -Doing well on esomeprazole. -Enforced antireflux measures -Prior history of Barrett's esophagus on EGD in 2021, not apparent on EGD in 2022, recommend 5-year surveillance colonoscopy in April 2027, previously discussed with Dr. Marletta Lor. -Return office visit in 1 year or sooner if needed  Constipation -Poorly controlled on Trulance -We will send Linzess 145 mcg daily to her pharmacy to see what her insurance coverage is currently. samples also provided.     Leanna Battles. Melvyn Neth, MHS,  PA-C [facility]

## 2023-10-21 ENCOUNTER — Ambulatory Visit (INDEPENDENT_AMBULATORY_CARE_PROVIDER_SITE_OTHER): Payer: Managed Care, Other (non HMO) | Admitting: Family Medicine

## 2023-10-21 ENCOUNTER — Encounter: Payer: Self-pay | Admitting: Family Medicine

## 2023-10-21 ENCOUNTER — Ambulatory Visit (HOSPITAL_COMMUNITY)
Admission: RE | Admit: 2023-10-21 | Discharge: 2023-10-21 | Disposition: A | Discharge status: Home / Self Care | Payer: Managed Care, Other (non HMO) | Source: Ambulatory Visit | Attending: Family Medicine | Admitting: Family Medicine

## 2023-10-21 VITALS — BP 132/76 | HR 120 | Temp 99.9°F | Ht 62.0 in | Wt 101.6 lb

## 2023-10-21 DIAGNOSIS — R051 Acute cough: Secondary | ICD-10-CM

## 2023-10-21 DIAGNOSIS — J441 Chronic obstructive pulmonary disease with (acute) exacerbation: Secondary | ICD-10-CM | POA: Insufficient documentation

## 2023-10-21 MED ORDER — PREDNISONE 50 MG PO TABS: 50.0000 mg | ORAL_TABLET | Freq: Every day | ORAL | 0 refills | Status: AC

## 2023-10-21 MED ORDER — AMOXICILLIN-POT CLAVULANATE 875-125 MG PO TABS: 1.0000 | ORAL_TABLET | Freq: Two times a day (BID) | ORAL | 0 refills | Status: DC

## 2023-10-21 NOTE — Progress Notes (Signed)
Subjective:  Patient ID: Carla Wise, female    DOB: [birth year]  Age: 57 y.o. MRN: [id number]  CC: Respiratory symptoms   HPI:  57 year old female with prior x-ray findings consistent with COPD presents with respiratory symptoms.  Patient states that her symptoms started on Saturday.  She has had cough and congestion.  Associated headache.  No documented fever.  Temp 99.9 here.  Denies shortness of breath.  Cough is productive.  She reports some associated sore throat when she coughs.  No reported sick contacts.  No relieving factors.  No other associated symptoms.  Patient Active Problem List   Diagnosis Date Noted   COPD exacerbation (HCC) 10/21/2023   CIN III (cervical intraepithelial neoplasia grade III) with severe dysplasia 11/14/2022   Dense breasts 07/13/2022   External hemorrhoid 01/23/2021   Barrett's esophagus 10/10/2020   Insomnia 09/16/2020   Hyperlipidemia 09/16/2020   History of gastric polyp 01/28/2019   Gastroesophageal reflux disease 12/30/2018   Anxiety 06/18/2018   Adhesive capsulitis of left shoulder    Dyspepsia and disorder of function of stomach    Arthritis of knee, degenerative 03/09/2014   Medial meniscus, posterior horn derangement 08/02/2011    Social Hx   Social History   Socioeconomic History   Marital status: Married    Spouse name: Not on file   Number of children: Not on file   Years of education: 12th grade   Highest education level: Not on file  Occupational History   Occupation: [employer]    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   Tobacco Use   Smoking status: Every Day    Current packs/day: 0.25    Average packs/day: 0.3 packs/day for 25.0 years (6.3 ttl pk-yrs)    Types: Cigarettes   Smokeless tobacco: Never   Tobacco comments:    Smokes 5 cigs daily  Vaping Use   Vaping status: Never Used  Substance and Sexual Activity   Alcohol use: No   Drug use: No   Sexual activity: Yes    Birth control/protection: Surgical  Other Topics Concern    Not on file  Social History Narrative   Not on file   Social Determinants of Health   Financial Resource Strain: Low Risk  (09/05/2022)   Overall Financial Resource Strain (CARDIA)    Difficulty of Paying Living Expenses: Not hard at all  Food Insecurity: No Food Insecurity (09/05/2022)   Hunger Vital Sign    Worried About Running Out of Food in the Last Year: Never true    Ran Out of Food in the Last Year: Never true  Transportation Needs: No Transportation Needs (09/05/2022)   PRAPARE - Administrator, Civil Service (Medical): No    Lack of Transportation (Non-Medical): No  Physical Activity: Sufficiently Active (09/05/2022)   Exercise Vital Sign    Days of Exercise per Week: 7 days    Minutes of Exercise per Session: 100 min  Stress: Patient Declined (09/05/2022)   [employer] of Occupational Health - Occupational Stress Questionnaire    Feeling of Stress : Patient declined  Social Connections: Moderately Integrated (09/05/2022)   Social Connection and Isolation Panel [NHANES]    Frequency of Communication with Friends and Family: More than three times a week    Frequency of Social Gatherings with Friends and Family: Once a week    Attends Religious Services: More than 4 times per year    Active Member of [employer] or Organizations: No    Attends Banker  Meetings: Never    Marital Status: Married    Review of Systems Per HPI  Objective:  BP 132/76   Pulse (!) 120   Temp 99.9 F (37.7 C)   Ht 5\' 2"  (1.575 m)   Wt 101 lb 9.6 oz (46.1 kg)   LMP  (LMP Unknown)   SpO2 90%   BMI 18.58 kg/m      10/21/2023    3:23 PM 09/24/2023    3:55 PM 09/06/2023    1:02 PM  BP/Weight  Systolic BP 132 114 117  Diastolic BP 76 72 76  Wt. (Lbs) 101.6 100.8 99  BMI 18.58 kg/m2 18.44 kg/m2 18.71 kg/m2    Physical Exam Vitals and nursing note reviewed.  Constitutional:      Comments: Appears fatigued but in no acute distress.  HENT:     Head:  Normocephalic and atraumatic.     Right Ear: Tympanic membrane normal.     Left Ear: Tympanic membrane normal.     Mouth/Throat:     Pharynx: Posterior oropharyngeal erythema present.  Eyes:     General:        Right eye: No discharge.        Left eye: No discharge.     Conjunctiva/sclera: Conjunctivae normal.  Cardiovascular:     Rate and Rhythm: Tachycardia present.  Pulmonary:     Effort: Pulmonary effort is normal.     Breath sounds: No wheezing or rales.  Neurological:     Mental Status: She is alert.     Lab Results  Component Value Date   WBC 6.5 04/16/2023   HGB 14.3 04/16/2023   HCT 43.6 04/16/2023   PLT 258 04/16/2023   GLUCOSE 89 04/16/2023   CHOL 189 08/09/2023   TRIG 158 (H) 08/09/2023   HDL 36 (L) 08/09/2023   LDLCALC 125 (H) 08/09/2023   ALT 6 04/16/2023   AST 14 04/16/2023   NA 141 04/16/2023   K 4.2 04/16/2023   CL 102 04/16/2023   CREATININE 0.73 04/16/2023   BUN 10 04/16/2023   CO2 24 04/16/2023   TSH 1.020 04/16/2023     Assessment & Plan:   Problem List Items Addressed This Visit       Respiratory   COPD exacerbation (HCC) - Primary    Clinically, I suspect the patient is experiencing COPD exacerbation.  She is tachycardic and oxygen saturation ranging from 90 to 92%.  Chest x-ray was obtained.  Independent interpretation: I do not appreciate any focal infiltrate.  Emphysematous changes noted. Placing on prednisone and Augmentin.  Needs follow-up later this week.  Advised her to get a pulse ox at home.  She is to go to the hospital if she worsens.      Relevant Medications   predniSONE (DELTASONE) 50 MG tablet   Other Visit Diagnoses     Acute cough       Relevant Orders   DG Chest 2 View   COVID-19, Flu A+B and RSV       Meds ordered this encounter  Medications   amoxicillin-clavulanate (AUGMENTIN) 875-125 MG tablet    Sig: Take 1 tablet by mouth 2 (two) times daily.    Dispense:  14 tablet    Refill:  0   predniSONE  (DELTASONE) 50 MG tablet    Sig: Take 1 tablet (50 mg total) by mouth daily for 5 days.    Dispense:  5 tablet    Refill:  0  Follow-up:  Needs follow up later this week.  Everlene Other DO [facility]

## 2023-10-21 NOTE — Patient Instructions (Signed)
Chest xray at the hospital.  Get a pulse ox. If you drop below 90% you need to go the ER.  I will call with results.  Take care  Dr. Adriana Simas

## 2023-10-21 NOTE — Assessment & Plan Note (Signed)
Clinically, I suspect the patient is experiencing COPD exacerbation.  She is tachycardic and oxygen saturation ranging from 90 to 92%.  Chest x-ray was obtained.  Independent interpretation: I do not appreciate any focal infiltrate.  Emphysematous changes noted. Placing on prednisone and Augmentin.  Needs follow-up later this week.  Advised her to get a pulse ox at home.  She is to go to the hospital if she worsens.

## 2023-10-23 ENCOUNTER — Encounter: Payer: Self-pay | Admitting: Family Medicine

## 2023-10-23 ENCOUNTER — Encounter (HOSPITAL_COMMUNITY): Payer: Self-pay

## 2023-10-23 ENCOUNTER — Other Ambulatory Visit: Payer: Self-pay

## 2023-10-23 ENCOUNTER — Emergency Department (HOSPITAL_COMMUNITY): Payer: Managed Care, Other (non HMO)

## 2023-10-23 ENCOUNTER — Emergency Department (HOSPITAL_COMMUNITY)
Admission: EM | Admit: 2023-10-23 | Discharge: 2023-10-23 | Disposition: A | Discharge status: Home / Self Care | Payer: Managed Care, Other (non HMO) | Attending: Emergency Medicine | Admitting: Emergency Medicine

## 2023-10-23 DIAGNOSIS — Z20822 Contact with and (suspected) exposure to covid-19: Secondary | ICD-10-CM | POA: Diagnosis not present

## 2023-10-23 DIAGNOSIS — J441 Chronic obstructive pulmonary disease with (acute) exacerbation: Secondary | ICD-10-CM | POA: Diagnosis not present

## 2023-10-23 DIAGNOSIS — R059 Cough, unspecified: Secondary | ICD-10-CM | POA: Diagnosis present

## 2023-10-23 LAB — COVID-19, FLU A+B AND RSV
Influenza A, NAA: NOT DETECTED
Influenza B, NAA: NOT DETECTED
RSV, NAA: NOT DETECTED
SARS-CoV-2, NAA: NOT DETECTED

## 2023-10-23 LAB — RESP PANEL BY RT-PCR (RSV, FLU A&B, COVID)  RVPGX2
Influenza A by PCR: NEGATIVE
Influenza B by PCR: NEGATIVE
Resp Syncytial Virus by PCR: NEGATIVE
SARS Coronavirus 2 by RT PCR: NEGATIVE

## 2023-10-23 LAB — SPECIMEN STATUS REPORT

## 2023-10-23 MED ORDER — BENZONATATE 100 MG PO CAPS: 100.0000 mg | ORAL_CAPSULE | Freq: Three times a day (TID) | ORAL | 0 refills | Status: DC

## 2023-10-23 MED ORDER — IPRATROPIUM-ALBUTEROL 0.5-2.5 (3) MG/3ML IN SOLN
3.0000 mL | RESPIRATORY_TRACT | Status: AC
  Administered 2023-10-23 (×2): 3 mL via RESPIRATORY_TRACT
  Filled 2023-10-23: qty 6

## 2023-10-23 MED ORDER — DOXYCYCLINE HYCLATE 100 MG PO CAPS: 100.0000 mg | ORAL_CAPSULE | Freq: Two times a day (BID) | ORAL | 0 refills | Status: AC

## 2023-10-23 MED ORDER — DEXTROMETHORPHAN POLISTIREX ER 30 MG/5ML PO SUER
30.0000 mg | Freq: Once | ORAL | Status: AC
  Administered 2023-10-23: 30 mg via ORAL
  Filled 2023-10-23: qty 5

## 2023-10-23 MED ORDER — ALBUTEROL SULFATE (2.5 MG/3ML) 0.083% IN NEBU: 2.5000 mg | INHALATION_SOLUTION | Freq: Four times a day (QID) | RESPIRATORY_TRACT | 12 refills | Status: DC | PRN

## 2023-10-23 NOTE — Discharge Instructions (Addendum)
Thank you for allowing [place] to be a part of your care today.  You were evaluated for ongoing cough.  Your chest x-ray did not show significant change.  Continue taking your prednisone and Augmentin as prescribed until you complete these medications.  Begin taking the doxycycline I have prescribed for you starting today.  Be sure to stay sitting upright for at least 30 minutes after taking this medication to avoid irritating your esophagus.   You may continue taking Delsym as needed for cough.  I have sent in albuterol nebulizer solutions to the pharmacy.  Use these as needed every 6 hours for cough, chest tightness, or wheezing.   Follow up with your primary care doctor in 1 week.  Return to the ED if you develop sudden worsening of your symptoms or if you have any new concerns.

## 2023-10-23 NOTE — Telephone Encounter (Signed)
Cook, Castorland G, DO     You low oxygen saturation is what bothers me. Both of these medications work well for respiratory infections and COPD.

## 2023-10-23 NOTE — ED Triage Notes (Signed)
Pt reports she saw her PCP on Monday and was given augmentin and prednisone for cough but pt saws she is sore all over from coughing and doesn't feel like the meds are helping.

## 2023-10-23 NOTE — ED Provider Notes (Signed)
[facility] EMERGENCY DEPARTMENT AT [facility] Provider Note   CSN: [id number] Arrival date & time: 10/23/23  1121     History  Chief Complaint  Patient presents with   Cough    Carla Wise is a 57 y.o. female with past medical history significant for GERD, Barrett's esophagus, anxiety, hyperlipidemia, COPD presents to the ED complaining of persistent cough.  Patient states that she saw her PCP on Monday was given Augmentin and prednisone.  She reports that she feels sore all over from coughing and does not feel like the medications are helping.  She does not use any inhalers.  She reports she has a productive cough with yellow and green phlegm.  Denies fevers, chills, shortness of breath.         Home Medications Prior to Admission medications   Medication Sig Start Date End Date Taking? Authorizing Provider  albuterol (PROVENTIL) (2.5 MG/3ML) 0.083% nebulizer solution Take 3 mLs (2.5 mg total) by nebulization every 6 (six) hours as needed for wheezing or shortness of breath. 10/23/23  Yes Lorianne Malbrough R, PA-C  benzonatate (TESSALON) 100 MG capsule Take 1 capsule (100 mg total) by mouth every 8 (eight) hours. 10/23/23  Yes Ngai Parcell R, PA-C  doxycycline (VIBRAMYCIN) 100 MG capsule Take 1 capsule (100 mg total) by mouth 2 (two) times daily for 5 days. 10/23/23 10/28/23 Yes Makaela Cando R, PA-C  acetaminophen (TYLENOL) 325 MG tablet Take 2 tablets (650 mg total) by mouth every 6 (six) hours as needed. 11/14/22   Myna Hidalgo, DO  amoxicillin-clavulanate (AUGMENTIN) 875-125 MG tablet Take 1 tablet by mouth 2 (two) times daily. 10/21/23   Tommie Sams, DO  cycloSPORINE (RESTASIS) 0.05 % ophthalmic emulsion SMARTSIG:In Eye(s) 09/07/23   [provider]  esomeprazole (NEXIUM) 40 MG capsule Take 1 capsule by mouth 30 minutes before a meal once to twice daily for acid reflux 09/24/23   Tiffany Kocher, PA-C  hydrocortisone (ANUSOL-HC) 2.5 % rectal cream APPLY A  SMALL PEA SIZED AMOUNT PER YOUR RECTUM 2 TO 4 TIMES DAILY FOR 10 TO 14 DAYS. Patient taking differently: As needed 09/06/21   Gelene Mink, NP  lidocaine (XYLOCAINE) 5 % ointment APPLY A SMALL PEA SIZED AMOUNT PER RECTUM TWICE DAILY FOR RECTAL PAIN RELATED TO HEMMORRHOIDS. Patient taking differently: As needed 02/07/22   Lanelle Bal, DO  linaclotide [facility]) 145 MCG CAPS capsule Take 1 capsule (145 mcg total) by mouth daily before breakfast. 09/24/23   Tiffany Kocher, PA-C  meclizine (ANTIVERT) 25 MG tablet Take 1 tablet (25 mg total) by mouth 2 (two) times daily as needed for dizziness or nausea. Patient taking differently: Take 25 mg by mouth 2 (two) times daily as needed for dizziness or nausea. As needed 09/06/22   [name], Alvino Chapel, FNP  mupirocin ointment (BACTROBAN) 2 % Apply thin amount twice daily to affected area over the next 7 days Patient taking differently: Apply thin amount twice daily to affected area over the next 7 days as needed 12/14/22   Babs Sciara, MD  predniSONE (DELTASONE) 50 MG tablet Take 1 tablet (50 mg total) by mouth daily for 5 days. 10/21/23 10/26/23  Tommie Sams, DO  sertraline (ZOLOFT) 50 MG tablet Take 1 tablet (50 mg total) by mouth daily. 08/14/23   Campbell Riches, NP      Allergies    Azithromycin, Levaquin [levofloxacin in d5w], and Lexapro [escitalopram oxalate]    Review of Systems  Review of Systems  Constitutional:  Negative for chills and fever.  Respiratory:  Positive for cough. Negative for chest tightness and shortness of breath.   Musculoskeletal:  Positive for myalgias.    Physical Exam Updated Vital Signs BP 106/66 (BP Location: Left Arm)   Pulse (!) 103   Temp 98.5 F (36.9 C) (Oral)   Resp 18   Ht 5\' 1"  (1.549 m)   Wt 45.8 kg   LMP  (LMP Unknown)   SpO2 94%   BMI 19.08 kg/m  Physical Exam Vitals and nursing note reviewed.  Constitutional:      General: She is not in acute distress.    Appearance: Normal  appearance. She is not ill-appearing or diaphoretic.  HENT:     Mouth/Throat:     Lips: Pink.     Mouth: Mucous membranes are moist.     Comments: Voice is mildly hoarse. Cardiovascular:     Rate and Rhythm: Normal rate and regular rhythm.  Pulmonary:     Effort: Pulmonary effort is normal. No tachypnea, accessory muscle usage or respiratory distress.     Breath sounds: Normal air entry. Examination of the right-lower field reveals decreased breath sounds. Examination of the left-lower field reveals decreased breath sounds. Decreased breath sounds present.     Comments: Patient speaking in full sentences.  Dry cough appreciated.  Skin:    General: Skin is warm and dry.     Capillary Refill: Capillary refill takes less than 2 seconds.  Neurological:     Mental Status: She is alert. Mental status is at baseline.  Psychiatric:        Mood and Affect: Mood normal.        Behavior: Behavior normal.     ED Results / Procedures / Treatments   Labs (all labs ordered are listed, but only abnormal results are displayed) Labs Reviewed  RESP PANEL BY RT-PCR (RSV, FLU A&B, COVID)  [id number]    EKG None  Radiology DG Chest 2 View  Result Date: 10/21/2023 CLINICAL DATA:  Productive cough for 3 days.  COPD. EXAM: CHEST - 2 VIEW COMPARISON:  09/12/2020 FINDINGS: The heart size and mediastinal contours are within normal limits. Pulmonary hyperinflation and chronic interstitial thickening are unchanged, consistent with COPD. No evidence of acute infiltrate or edema. No evidence of pleural effusion. IMPRESSION: COPD. No active cardiopulmonary disease. Electronically Signed   By: Danae Orleans M.D.   On: 10/21/2023 18:55    Procedures Procedures    Medications Ordered in ED Medications  dextromethorphan (DELSYM) 30 MG/5ML liquid 30 mg (30 mg Oral Given 10/23/23 1422)  ipratropium-albuterol (DUONEB) 0.5-2.5 (3) MG/3ML nebulizer solution 3 mL (3 mLs Nebulization Given 10/23/23 1405)    ED  Course/ Medical Decision Making/ A&P                                 Medical Decision Making Amount and/or Complexity of Data Reviewed Radiology: ordered.  Risk OTC drugs. Prescription drug management.   This patient presents to the ED with chief complaint(s) of cough and generalized soreness with pertinent past medical history of COPD.  The complaint involves an extensive differential diagnosis and also carries with it a high risk of complications and morbidity.    The differential diagnosis includes pneumonia, COPD exacerbation   The initial plan is to obtain chest x-ray for comparison  Additional history obtained: Records reviewed Primary Care Documents  Initial  Assessment:   On exam, patient is sitting in exam room and does not appear to be in acute respiratory distress.  Diminished breath sounds in the bases.  Dry cough appreciated during exam.  No obvious wheezing.  Vitals are stable.  Patient is speaking in full sentences.  Voice is mildly hoarse.  Mucus membranes are moist.    Independent visualization and interpretation of imaging: I independently visualized the following imaging with scope of interpretation limited to determining acute life threatening conditions related to emergency care: chest x-ray, which revealed hyperinflation of the lungs and chronic interstitial markings consistent with COPD.  No infiltrate or other consolidation.  Appears similar to previous chest x-ray.  Treatment and Reassessment: Patient given DuoNeb breathing treatment x2 and dextromethorphan.  She reports feeling improved.  She does still have some cough, however, lung sounds are improved.  SPO2 went from 92% to 94%.   Disposition:   Patient reports she does have nebulizer machine at home that she can use for breathing treatments.  Advised patient to finish taking prednisone and Augmentin as prescribed.  Will add doxycycline for COPD exacerbation coverage.  Tessalon Perles also sent to help with  cough.  Advised patient to follow up with PCP within 1 week.    The patient has been appropriately medically screened and/or stabilized in the ED. I have low suspicion for any other emergent medical condition which would require further screening, evaluation or treatment in the ED or require inpatient management. At time of discharge the patient is hemodynamically stable and in no acute distress. I have discussed work-up results and diagnosis with patient and answered all questions. Patient is agreeable with discharge plan. We discussed strict return precautions for returning to the emergency department and they verbalized understanding.     Social Determinants of Health:   Patient's  tobacco abuse  increases the complexity of managing their presentation         Final Clinical Impression(s) / ED Diagnoses Final diagnoses:  COPD with acute exacerbation (HCC)    Rx / DC Orders ED Discharge Orders          Ordered    doxycycline (VIBRAMYCIN) 100 MG capsule  2 times daily        10/23/23 1525    benzonatate (TESSALON) 100 MG capsule  Every 8 hours        10/23/23 1525    albuterol (PROVENTIL) (2.5 MG/3ML) 0.083% nebulizer solution  Every 6 hours PRN        10/23/23 1528              Lenard Simmer, PA-C 10/23/23 1557    Sloan Leiter, DO 10/24/23 1849

## 2023-11-01 ENCOUNTER — Ambulatory Visit (INDEPENDENT_AMBULATORY_CARE_PROVIDER_SITE_OTHER): Payer: Managed Care, Other (non HMO) | Admitting: Family Medicine

## 2023-11-01 ENCOUNTER — Encounter: Payer: Self-pay | Admitting: Family Medicine

## 2023-11-01 VITALS — BP 106/72 | HR 91 | Temp 97.4°F | Ht 61.0 in | Wt 103.0 lb

## 2023-11-01 DIAGNOSIS — J441 Chronic obstructive pulmonary disease with (acute) exacerbation: Secondary | ICD-10-CM | POA: Diagnosis not present

## 2023-11-01 NOTE — Patient Instructions (Signed)
Fatigue will improve.  Follow up with Dr. Lorin Picket.  Take care  Dr. Adriana Simas

## 2023-11-03 NOTE — Assessment & Plan Note (Signed)
Improved. Completed antibiotic course and steroids. Advised continued smoking cessation.

## 2023-11-03 NOTE — Progress Notes (Signed)
Subjective:  Patient ID: Carla Wise, female    DOB: 1966/07/01  Age: 57 y.o. MRN: [id number]  CC:   Chief Complaint  Patient presents with   ER follow up for COPD exascerbation    Cough is much better, but fatigue linguers Completed treatment    HPI:  57 year old female presents for follow up from recent COPD exacerbation.  COPD exacerbation Patient states that she is feeling better but she is still fatigued.  O2 sat improved. Patient is not currently smoking. Recommended continued cessation.  No fever. No other complaints.  Patient Active Problem List   Diagnosis Date Noted   COPD exacerbation (HCC) 10/21/2023   CIN III (cervical intraepithelial neoplasia grade III) with severe dysplasia 11/14/2022   Dense breasts 07/13/2022   External hemorrhoid 01/23/2021   Barrett's esophagus 10/10/2020   Insomnia 09/16/2020   Hyperlipidemia 09/16/2020   History of gastric polyp 01/28/2019   Gastroesophageal reflux disease 12/30/2018   Anxiety 06/18/2018   Adhesive capsulitis of left shoulder    Dyspepsia and disorder of function of stomach    Arthritis of knee, degenerative 03/09/2014   Medial meniscus, posterior horn derangement 08/02/2011    Social Hx   Social History   Socioeconomic History   Marital status: Married    Spouse name: Not on file   Number of children: Not on file   Years of education: 12th grade   Highest education level: Not on file  Occupational History   Occupation: [employer]    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   Tobacco Use   Smoking status: Every Day    Current packs/day: 0.25    Average packs/day: 0.3 packs/day for 25.0 years (6.3 ttl pk-yrs)    Types: Cigarettes   Smokeless tobacco: Never   Tobacco comments:    Smokes 5 cigs daily  Vaping Use   Vaping status: Never Used  Substance and Sexual Activity   Alcohol use: No   Drug use: No   Sexual activity: Yes    Birth control/protection: Surgical  Other Topics Concern   Not on file  Social  History Narrative   Not on file   Social Determinants of Health   Financial Resource Strain: Low Risk  (09/05/2022)   Overall Financial Resource Strain (CARDIA)    Difficulty of Paying Living Expenses: Not hard at all  Food Insecurity: No Food Insecurity (09/05/2022)   Hunger Vital Sign    Worried About Running Out of Food in the Last Year: Never true    Ran Out of Food in the Last Year: Never true  Transportation Needs: No Transportation Needs (09/05/2022)   PRAPARE - Administrator, Civil Service (Medical): No    Lack of Transportation (Non-Medical): No  Physical Activity: Sufficiently Active (09/05/2022)   Exercise Vital Sign    Days of Exercise per Week: 7 days    Minutes of Exercise per Session: 100 min  Stress: Patient Declined (09/05/2022)   [employer] of Occupational Health - Occupational Stress Questionnaire    Feeling of Stress : Patient declined  Social Connections: Moderately Integrated (09/05/2022)   Social Connection and Isolation Panel [NHANES]    Frequency of Communication with Friends and Family: More than three times a week    Frequency of Social Gatherings with Friends and Family: Once a week    Attends Religious Services: More than 4 times per year    Active Member of [employer] or Organizations: No    Attends Banker Meetings:  Never    Marital Status: Married    Review of Systems Per HPI  Objective:  BP 106/72   Pulse 91   Temp (!) 97.4 F (36.3 C)   Ht 5\' 1"  (1.549 m)   Wt 103 lb (46.7 kg)   LMP  (LMP Unknown)   SpO2 98%   BMI 19.46 kg/m      11/01/2023    3:27 PM 10/23/2023    3:08 PM 10/23/2023   11:59 AM  BP/Weight  Systolic BP 106 106 114  Diastolic BP 72 66 79  Wt. (Lbs) 103  101  BMI 19.46 kg/m2  19.08 kg/m2    Physical Exam Vitals and nursing note reviewed.  Constitutional:      General: She is not in acute distress.    Appearance: Normal appearance.  HENT:     Head: Normocephalic and atraumatic.   Cardiovascular:     Rate and Rhythm: Normal rate and regular rhythm.  Pulmonary:     Effort: Pulmonary effort is normal.     Breath sounds: Normal breath sounds. No wheezing or rales.  Neurological:     Mental Status: She is alert.     Lab Results  Component Value Date   WBC 6.5 04/16/2023   HGB 14.3 04/16/2023   HCT 43.6 04/16/2023   PLT 258 04/16/2023   GLUCOSE 89 04/16/2023   CHOL 189 08/09/2023   TRIG 158 (H) 08/09/2023   HDL 36 (L) 08/09/2023   LDLCALC 125 (H) 08/09/2023   ALT 6 04/16/2023   AST 14 04/16/2023   NA 141 04/16/2023   K 4.2 04/16/2023   CL 102 04/16/2023   CREATININE 0.73 04/16/2023   BUN 10 04/16/2023   CO2 24 04/16/2023   TSH 1.020 04/16/2023     Assessment & Plan:   Problem List Items Addressed This Visit       Respiratory   COPD exacerbation (HCC) - Primary    Improved. Completed antibiotic course and steroids. Advised continued smoking cessation.      Follow-up:  Return in about 6 months (around 04/30/2024).  Everlene Other DO [facility]

## 2023-11-21 ENCOUNTER — Encounter: Payer: Self-pay | Admitting: Family Medicine

## 2023-11-21 ENCOUNTER — Ambulatory Visit (INDEPENDENT_AMBULATORY_CARE_PROVIDER_SITE_OTHER): Payer: Managed Care, Other (non HMO) | Admitting: Family Medicine

## 2023-11-21 VITALS — BP 100/60 | HR 94 | Temp 97.2°F | Ht 61.0 in | Wt 103.0 lb

## 2023-11-21 DIAGNOSIS — J441 Chronic obstructive pulmonary disease with (acute) exacerbation: Secondary | ICD-10-CM

## 2023-11-21 MED ORDER — PREDNISONE 20 MG PO TABS: ORAL_TABLET | ORAL | 0 refills | Status: DC

## 2023-11-21 MED ORDER — AMOXICILLIN-POT CLAVULANATE 875-125 MG PO TABS: 1.0000 | ORAL_TABLET | Freq: Two times a day (BID) | ORAL | 0 refills | Status: DC

## 2023-11-22 NOTE — Progress Notes (Signed)
   Subjective:    Patient ID: Carla Wise, female    DOB: 08/23/1966, 57 y.o.   MRN: [id number]  History of Present Illness   The patient, with a history of smoking, presented with a cough and congestion that began five days prior. The cough is productive, with the patient noting that she has been coughing up sputum since the first day of symptoms. The cough is described as originating from the back of the throat and extending down into the chest. The patient denies experiencing shortness of breath, sweats, chills, or wheezing. She reports no chest or head congestion, but does note that her ears feel stuffy.  The patient's overall health prior to this illness was reportedly stable, and she has been able to maintain her daily activities. She works in a cold environment with no heating, which she believes may have contributed to her current illness.  The patient admits to smoking, but has been trying to cut back. She currently smokes about two cigarettes a day, down from five to six a day. She acknowledges the need to quit completely for the sake of her future health.  The patient has not received a flu vaccine and has not been sick recently. She has been prescribed Augmentin and prednisone to manage her current symptoms. She also has albuterol at home, which she can use as needed.         Review of Systems     Objective:    Physical Exam   HEENT: External auditory canals clear, tympanic membranes normal. Pharynx normal, no exudate, no inflammation. Sinuses non-tender on palpation. NECK: No tenderness on palpation of cervical lymph nodes. CHEST: Wheezing auscultated bilaterally in lower lung fields, increased congestion in lung bases.           Assessment & Plan:  Assessment and Plan    Acute Bronchitis Cough and congestion with some wheezing and increased congestion in the lung bases. No shortness of breath, sweats, or chills. -Start Augmentin twice daily for 7 days with a  snack. -Short course of Prednisone, 20mg , two tablets once daily for 5 days. -Use Albuterol as needed every 4-6 hours for airway tightness. -Consider Robitussin DM for cough relief. -If not improving, patient to send MyChart message.  Tobacco Use Patient reports smoking 2 cigarettes per day. Discussed the long-term impact on lung health and encouraged cessation. -Continue efforts to reduce and eventually quit smoking.  General Health Maintenance -Consider Flu vaccine to reduce risk of severe influenza. -Consider Pneumococcal 20 vaccine as a one-time shot to reduce risk of severe bacterial pneumonia. Schedule as a nurse visit when not acutely ill.

## 2024-02-14 ENCOUNTER — Encounter: Payer: Self-pay | Admitting: *Deleted

## 2024-04-27 ENCOUNTER — Telehealth: Payer: Self-pay

## 2024-04-27 DIAGNOSIS — K2100 Gastro-esophageal reflux disease with esophagitis, without bleeding: Secondary | ICD-10-CM

## 2024-04-27 MED ORDER — ESOMEPRAZOLE MAGNESIUM 40 MG PO CPDR
DELAYED_RELEASE_CAPSULE | ORAL | 3 refills | Status: DC
Start: 2024-04-27 — End: 2024-10-21

## 2024-04-27 NOTE — Telephone Encounter (Signed)
 Pt is needing refills of esomeprazole  sent to [facility]

## 2024-04-27 NOTE — Addendum Note (Signed)
 Addended by: Lanney Pitts on: 04/27/2024 05:59 PM   Modules accepted: Orders

## 2024-04-29 ENCOUNTER — Ambulatory Visit: Payer: Self-pay

## 2024-04-29 ENCOUNTER — Encounter: Payer: Self-pay | Admitting: Nurse Practitioner

## 2024-04-29 NOTE — Telephone Encounter (Signed)
 Chief Complaint:  Urinary Symptoms   Symptoms:  - Urinary freq, blood flecks in urine ( dark), pain during urination.   Frequency:  -This morning    Patient denies  Fever, low back pain, difficulty starting the urinary stream, N/V   Disposition: [ ] ED /[ ] Urgent Care (no appt availability in office) / [facility] ]Appointment(In office/virtual)/ [ ]  [facility]/ [ ] Home Care/ [ ] Refused Recommended Disposition /[ ] [facility]/ [ ]  Follow-up with PCP   Additional Notes:  Reports she has UTI's with these symptoms frequently. Patient scheduled with PCP appropriately. Patient educated on pertinent s/s that would warrant emergent help/call 911/ ED/UC.  Patient verbalized understanding. No additional questions/concerns noted during the time of the call.     Complete triage note below:     Copied from CRM [id number]. Topic: Clinical - Red Word Triage >> Apr 29, 2024  1:04 PM Carla Wise wrote: [facility] that prompted transfer to Nurse Triage: pt has blood in her urine Reason for Disposition  Urinating more frequently than usual (i.e., frequency)  Answer Assessment - Initial Assessment Questions 1. SYMPTOM: "What's the main symptom you're concerned about?" (e.g., frequency, incontinence)     --- Blood in urine, pressure, pain during urination,  2. ONSET: "When did the   start?"     This morning     3. PAIN: "Is there any pain?" If Yes, ask: "How bad is it?" (Scale: 1-10; mild, moderate, severe)        4. CAUSE: "What do you think is causing the symptoms?"    ---- Thinks its a UTI. Has had UTIs, with blood in the past.    5. OTHER SYMPTOMS: "Do you have any other symptoms?" (e.g., blood in urine, fever, flank pain, pain with urination)     -- Pressure --- Pain-- During urinationg 4-5/10  Protocols used: Urinary Symptoms-A-AH

## 2024-04-29 NOTE — Telephone Encounter (Signed)
Noted appointment scheduled.

## 2024-04-29 NOTE — Telephone Encounter (Signed)
 Patient returning call from office to reschedule appointment. Appointment rescheduled. This RN called CAL to clarify that Dr. Fairy Homer would be in the office tomorrow.   Copied from CRM [id number]. Topic: Clinical - Red Word Triage >> Apr 29, 2024  3:31 PM Carla Wise wrote: [facility] that prompted transfer to Nurse Triage: blood in urine Reason for Disposition  Requesting regular office appointment  Protocols used: Information Only Call - No Triage-A-AH

## 2024-04-30 ENCOUNTER — Encounter: Payer: Self-pay | Admitting: Nurse Practitioner

## 2024-04-30 ENCOUNTER — Encounter: Payer: Self-pay | Admitting: Family Medicine

## 2024-04-30 ENCOUNTER — Ambulatory Visit: Payer: Managed Care, Other (non HMO) | Admitting: Nurse Practitioner

## 2024-04-30 ENCOUNTER — Ambulatory Visit: Payer: Self-pay | Admitting: Family Medicine

## 2024-04-30 ENCOUNTER — Ambulatory Visit (INDEPENDENT_AMBULATORY_CARE_PROVIDER_SITE_OTHER): Payer: Self-pay | Admitting: Family Medicine

## 2024-04-30 VITALS — BP 119/82 | HR 83 | Temp 97.9°F | Ht 61.0 in | Wt 100.0 lb

## 2024-04-30 DIAGNOSIS — R300 Dysuria: Secondary | ICD-10-CM

## 2024-04-30 DIAGNOSIS — R42 Dizziness and giddiness: Secondary | ICD-10-CM

## 2024-04-30 LAB — POCT URINALYSIS DIP (CLINITEK)
Bilirubin, UA: NEGATIVE
Glucose, UA: NEGATIVE mg/dL
Ketones, POC UA: NEGATIVE mg/dL
Nitrite, UA: NEGATIVE
POC PROTEIN,UA: 30 — AB
Spec Grav, UA: 1.01 (ref 1.010–1.025)
Urobilinogen, UA: 0.2 U/dL
pH, UA: 8 (ref 5.0–8.0)

## 2024-04-30 MED ORDER — CEPHALEXIN 500 MG PO CAPS
500.0000 mg | ORAL_CAPSULE | Freq: Three times a day (TID) | ORAL | 0 refills | Status: DC
Start: 2024-04-30 — End: 2024-05-29

## 2024-04-30 MED ORDER — SERTRALINE HCL 50 MG PO TABS: 50.0000 mg | ORAL_TABLET | Freq: Every day | ORAL | 1 refills | Status: DC

## 2024-04-30 MED ORDER — MECLIZINE HCL 25 MG PO TABS
25.0000 mg | ORAL_TABLET | Freq: Two times a day (BID) | ORAL | 1 refills | Status: AC | PRN
Start: 2024-04-30 — End: ?

## 2024-04-30 NOTE — Progress Notes (Signed)
   Subjective:    Patient ID: Carla Wise, female    DOB: Jul 23, 1966, 58 y.o.   MRN: [id number]  HPI  Patient reports having a lot of pelvic pressure and wiping blood on tissue with urination for a couple of days Patient relates a lot of lower pelvic pressure also intermittent increased frequency urination with occasional blood when she wipes slight dysuria denies high fever chills or sweats no flank pain or discomfort no nausea vomiting or diarrhea She is a smoker She has had a hysterectomy Review of Systems     Objective:   Physical Exam General-in no acute distress Eyes-no discharge Lungs-respiratory rate normal, CTA CV-no murmurs,RRR Extremities skin warm dry no edema Neuro grossly normal Behavior normal, alert Flanks are nontender to percussion       Assessment & Plan:   Urinalysis shows WBCs RBCs and bacteria Go ahead and treat with Keflex  3 times daily for 7 days Patient will do her follow-up visit with Orelia Binet in several weeks I highly recommend repeat urinalysis at that time to make sure total clearance of red blood cells If persistent red blood cells recommend urology referral given her smoking background  Patient was encouraged to quit smoking  She has been helping with caretaking of her parents under a lot of stress and anxiety has not been taking medication we reinitiated sertraline  side effects discussed call us  if any problems

## 2024-05-01 NOTE — Telephone Encounter (Signed)
 Not a pt at [facility]

## 2024-05-05 ENCOUNTER — Ambulatory Visit: Payer: Self-pay | Admitting: Nurse Practitioner

## 2024-05-29 ENCOUNTER — Encounter: Payer: Self-pay | Admitting: Nurse Practitioner

## 2024-05-29 ENCOUNTER — Ambulatory Visit (INDEPENDENT_AMBULATORY_CARE_PROVIDER_SITE_OTHER): Payer: Self-pay | Admitting: Nurse Practitioner

## 2024-05-29 VITALS — BP 102/68 | HR 83 | Temp 98.3°F | Ht 61.0 in | Wt 100.0 lb

## 2024-05-29 DIAGNOSIS — F418 Other specified anxiety disorders: Secondary | ICD-10-CM

## 2024-05-29 DIAGNOSIS — R8281 Pyuria: Secondary | ICD-10-CM

## 2024-05-29 DIAGNOSIS — R319 Hematuria, unspecified: Secondary | ICD-10-CM

## 2024-05-29 LAB — POCT URINALYSIS DIP (CLINITEK)
Bilirubin, UA: NEGATIVE
Blood, UA: NEGATIVE
Glucose, UA: NEGATIVE mg/dL
Ketones, POC UA: NEGATIVE mg/dL
Nitrite, UA: NEGATIVE
POC PROTEIN,UA: NEGATIVE
Spec Grav, UA: 1.01 (ref 1.010–1.025)
Urobilinogen, UA: 0.2 U/dL
pH, UA: 6 (ref 5.0–8.0)

## 2024-05-29 MED ORDER — FLUOXETINE HCL 10 MG PO CAPS: 10.0000 mg | ORAL_CAPSULE | Freq: Every day | ORAL | 0 refills | Status: DC

## 2024-05-29 MED ORDER — ALPRAZOLAM 0.5 MG PO TABS: 0.5000 mg | ORAL_TABLET | Freq: Every day | ORAL | 0 refills | Status: DC

## 2024-05-29 NOTE — Progress Notes (Signed)
 Subjective:    Patient ID: Carla Wise, female    DOB: 01-01-1966, 58 y.o.   MRN: [id number]  HPI Presents for recheck on hematuria seen on UA on 04/30/2024.  Was treated for UTI with Keflex  at that time.  States her symptoms resolved.  No fever.  No dysuria.  No urgency or frequency.  No CVA or flank pain.  Married, same sexual partner.  No vaginal discharge or pelvic pain.  Has had a vaginal hysterectomy with BSO.  No concerns about STDs. Patient started her sertraline  50 mg in the morning.  States it made her feel like a zombie.  Tried taking it at nighttime and then cutting the dose in half at nighttime and still experience the same symptoms.  Continues to have significant difficulty sleeping at night.  Was on melatonin for a long time but this stopped working.  Has also tried Ambien  several years ago, this worked for a while but then no longer helped.  Has taken low-dose Xanax  in the past which worked well.   Review of Systems  Constitutional:  Positive for fatigue.  Respiratory:  Negative for cough, chest tightness, shortness of breath and wheezing.   Genitourinary:  Negative for dysuria, flank pain, frequency, hematuria, pelvic pain, urgency and vaginal discharge.      05/29/2024    2:22 PM  Depression screen PHQ 2/9  Decreased Interest 2  Down, Depressed, Hopeless 2  PHQ - 2 Score 4  Altered sleeping 3  Tired, decreased energy 3  Change in appetite 3  Feeling bad or failure about yourself  0  Trouble concentrating 1  Moving slowly or fidgety/restless 3  Suicidal thoughts 0  PHQ-9 Score 17  Difficult doing work/chores Very difficult      05/29/2024    2:22 PM 07/26/2023    3:30 PM 06/17/2023    4:18 PM 04/16/2023    3:56 PM  GAD 7 : Generalized Anxiety Score  Nervous, Anxious, on Edge 3 3 3 3   Control/stop worrying 2 3 1 2   Worry too much - different things 2 3 1 3   Trouble relaxing 3 3 3 3   Restless 2 3 1 2   Easily annoyed or irritable 3 3 3 3   Afraid - awful might  happen 2 3 0 1  Total GAD 7 Score 17 21 12 17   Anxiety Difficulty Very difficult Extremely difficult Extremely difficult     Social History   Tobacco Use   Smoking status: Every Day    Current packs/day: 0.25    Average packs/day: 0.3 packs/day for 25.0 years (6.3 ttl pk-yrs)    Types: Cigarettes   Smokeless tobacco: Never   Tobacco comments:    Smokes 5 cigs daily  Vaping Use   Vaping status: Never Used  Substance Use Topics   Alcohol use: No   Drug use: No        Objective:   Physical Exam Vitals and nursing note reviewed.  Constitutional:      General: She is not in acute distress.  Cardiovascular:     Rate and Rhythm: Normal rate and regular rhythm.  Pulmonary:     Effort: Pulmonary effort is normal.     Breath sounds: Normal breath sounds.  Abdominal:     General: There is no distension.     Palpations: There is no mass.     Tenderness: There is no abdominal tenderness. There is no right CVA tenderness or left CVA tenderness.   Neurological:  Mental Status: She is alert.   Psychiatric:        Mood and Affect: Mood normal.        Behavior: Behavior normal.        Thought Content: Thought content normal.        Judgment: Judgment normal.   Mildly anxious affect.  Making good eye contact. Today's Vitals   05/29/24 1417  BP: 102/68  Pulse: 83  Temp: 98.3 F (36.8 C)  SpO2: 98%  Weight: 100 lb (45.4 kg)  Height: 5' 1 (1.549 m)   Body mass index is 18.89 kg/m. Results for orders placed or performed in visit on 05/29/24  POCT URINALYSIS DIP (CLINITEK)   Collection Time: 05/29/24  2:26 PM  Result Value Ref Range   Color, UA yellow yellow   Clarity, UA clear clear   Glucose, UA negative negative mg/dL   Bilirubin, UA negative negative   Ketones, POC UA negative negative mg/dL   Spec Grav, UA 1.610 9.604 - 1.025   Blood, UA negative negative   pH, UA 6.0 5.0 - 8.0   POC PROTEIN,UA negative negative, trace   Urobilinogen, UA 0.2 0.2 or 1.0  E.U./dL   Nitrite, UA Negative Negative   Leukocytes, UA Moderate (2+) (A) Negative           Assessment & Plan:   Problem List Items Addressed This Visit       Other   Anxiety with depression   Hematuria - Primary   Relevant Orders   POCT URINALYSIS DIP (CLINITEK) (Completed)   Urine Culture   Pyuria   Relevant Orders   Urine Culture   Meds ordered this encounter  Medications   FLUoxetine (PROZAC) 10 MG capsule    Sig: Take 1 capsule (10 mg total) by mouth daily.    Dispense:  30 capsule    Refill:  0    Supervising Provider:   Charlotta Cook A [[id number]]   ALPRAZolam  (XANAX ) 0.5 MG tablet    Sig: Take 1 tablet (0.5 mg total) by mouth at bedtime. Prn sleep    Dispense:  30 tablet    Refill:  0    Supervising Provider:   Charlotta Cook A [[id number]]   Since patient is asymptomatic, hold on further antibiotics.  Hematuria has resolved but she has persistent moderate leukocytes under UA.  Urine sent for culture.  Warning signs reviewed.  Patient to get seen if she has worsening symptoms. Patient has significant anxiety and depression.  Feel that adequate sleep is essential, restart low-dose Xanax  as directed for sleep.  Trial of Prozac 10 mg daily.  If patient has any adverse effects, stop medication and contact office. Return in 1 month (on 06/28/2024) for Virtual or in person.

## 2024-05-29 NOTE — Addendum Note (Signed)
 Addended by: Dave Mannes C on: 05/29/2024 03:23 PM   Modules accepted: Orders

## 2024-05-31 LAB — URINE CULTURE

## 2024-05-31 LAB — SPECIMEN STATUS REPORT

## 2024-06-01 ENCOUNTER — Ambulatory Visit: Payer: Self-pay | Admitting: Nurse Practitioner

## 2024-06-29 ENCOUNTER — Ambulatory Visit (INDEPENDENT_AMBULATORY_CARE_PROVIDER_SITE_OTHER): Payer: Self-pay | Admitting: Orthopedic Surgery

## 2024-06-29 ENCOUNTER — Telehealth: Payer: Self-pay | Admitting: Nurse Practitioner

## 2024-06-29 ENCOUNTER — Ambulatory Visit (INDEPENDENT_AMBULATORY_CARE_PROVIDER_SITE_OTHER): Payer: Self-pay

## 2024-06-29 VITALS — BP 108/71 | HR 92 | Ht 61.0 in | Wt 99.0 lb

## 2024-06-29 DIAGNOSIS — G4700 Insomnia, unspecified: Secondary | ICD-10-CM

## 2024-06-29 DIAGNOSIS — M4722 Other spondylosis with radiculopathy, cervical region: Secondary | ICD-10-CM

## 2024-06-29 DIAGNOSIS — M79601 Pain in right arm: Secondary | ICD-10-CM

## 2024-06-29 DIAGNOSIS — F418 Other specified anxiety disorders: Secondary | ICD-10-CM

## 2024-06-29 DIAGNOSIS — R202 Paresthesia of skin: Secondary | ICD-10-CM

## 2024-06-29 DIAGNOSIS — R200 Anesthesia of skin: Secondary | ICD-10-CM

## 2024-06-29 MED ORDER — GABAPENTIN 100 MG PO CAPS: 100.0000 mg | ORAL_CAPSULE | Freq: Three times a day (TID) | ORAL | 2 refills | Status: DC

## 2024-06-29 MED ORDER — PREDNISONE 10 MG (48) PO TBPK: ORAL_TABLET | Freq: Every day | ORAL | 0 refills | Status: DC

## 2024-06-29 MED ORDER — FLUOXETINE HCL 20 MG PO TABS: 20.0000 mg | ORAL_TABLET | Freq: Every day | ORAL | 0 refills | Status: DC

## 2024-06-29 NOTE — Progress Notes (Unsigned)
 Virtual Visit via Video Note  I connected with Carla Wise on 06/29/24 at  2:50 PM EDT by a video enabled telemedicine application and verified that I am speaking with the correct person using two identifiers.  Location: Patient: home Provider: office   I discussed the limitations of evaluation and management by telemedicine and the availability of in person appointments. The patient expressed understanding and agreed to proceed.  History of Present Illness: Since for routine follow-up of her anxiety and depression.  Doing well on Xanax  0.5 mg at bedtime for sleep.  Has seen improvement in her anxiety and depression but would like to increase her dose of fluoxetine .  Denies any significant adverse effects.   Observations/Objective: NAD.  Alert, oriented.  Calm cheerful affect.  Making good eye contact.  Speech clear.  Thoughts logical coherent and relevant.  Normal judgment and behavior.  Assessment and Plan: Problem List Items Addressed This Visit       Other   Anxiety with depression - Primary   Relevant Medications   FLUoxetine  (PROZAC ) 20 MG tablet   Insomnia   Meds ordered this encounter  Medications   FLUoxetine  (PROZAC ) 20 MG tablet    Sig: Take 1 tablet (20 mg total) by mouth daily.    Dispense:  30 tablet    Refill:  0    Supervising Provider:   ALPHONSA HAMILTON A [[id number]]   ALPRAZolam  (XANAX ) 0.5 MG tablet    Sig: Take 1 tablet (0.5 mg total) by mouth at bedtime. Prn sleep    Dispense:  30 tablet    Refill:  2    Supervising Provider:   ALPHONSA HAMILTON A [[id number]]       Follow Up Instructions: Increase fluoxetine  dose to 20 mg daily.  Contact office if any adverse effects with new dosing. Continue low-dose Xanax  at bedtime for sleep. Return in about 3 months (around 09/29/2024).    I discussed the assessment and treatment plan with the patient. The patient was provided an opportunity to ask questions and all were answered. The patient agreed with the plan and  demonstrated an understanding of the instructions.   The patient was advised to call back or seek an in-person evaluation if the symptoms worsen or if the condition fails to improve as anticipated.  I provided 15 minutes of non-face-to-face time during this encounter.   Carla JAYSON Quarry, NP

## 2024-06-29 NOTE — Progress Notes (Signed)
  Intake history:  BP 108/71   Pulse 92   Ht 5' 1 (1.549 m)   Wt 99 lb (44.9 kg)   LMP  (LMP Unknown)   BMI 18.71 kg/m  Body mass index is 18.71 kg/m.    WHAT ARE WE SEEING YOU FOR TODAY?   bilateral arm(s)  How Carla Wise has this bothered you? (DOI?DOS?WS?)  2 month(s) ago  Anticoag.  No  Diabetes No  Heart disease No  Hypertension No  SMOKING HX Yes  Kidney disease No  Any ALLERGIES ______________________________________________   Treatment:  Have you taken:  Tylenol  No  Advil  Yes  Had PT No  Had injection No  Other  _________________________

## 2024-06-29 NOTE — Progress Notes (Signed)
     Chief Complaint  Patient presents with   Arm Pain    Right- pain x 2 months starts at top of shoulder goes down the arm worse at night numbness to wrist left bothers me some too but nothing like the right    History this is a 58 year old female who has had a bout of adhesive capsulitis years ago right shoulder comes in today with aching pain right arm starts in the deltoid radiates down the arm into the hand wakes her up at night with a numb feeling  Took ibuprofen  didn't help  She denies any neck pain She denies any shoulder stiffness  Her exam is completely normal except for 3+ reflexes both extremities elbow and wrist with negative Hoffmann test  Shoulder full range of motion good strength no instability negative impingement sign  Neck range of motion normal including Spurling sign  Imaging  Note x-ray report is incorrect in the chart  Her x-ray showed the C-spine x-ray  Mid cervical degenerative changes mild retrolisthesis of C5 on 6, no other abnormality seen  Encounter Diagnoses  Name Primary?   Pain of right upper extremity    Numbness and tingling of right arm    Cervical spondylosis with radiculopathy Yes   Meds ordered this encounter  Medications   gabapentin  (NEURONTIN ) 100 MG capsule    Sig: Take 1 capsule (100 mg total) by mouth 3 (three) times daily.    Dispense:  90 capsule    Refill:  2   predniSONE  (STERAPRED UNI-PAK 48 TAB) 10 MG (48) TBPK tablet    Sig: Take by mouth daily. 10 mg ds 12 days    Dispense:  48 tablet    Refill:  0   Follow-up 3 weeks

## 2024-06-30 ENCOUNTER — Encounter: Payer: Self-pay | Admitting: Nurse Practitioner

## 2024-06-30 MED ORDER — ALPRAZOLAM 0.5 MG PO TABS: 0.5000 mg | ORAL_TABLET | Freq: Every day | ORAL | 2 refills | Status: DC

## 2024-07-20 ENCOUNTER — Ambulatory Visit (INDEPENDENT_AMBULATORY_CARE_PROVIDER_SITE_OTHER): Payer: Self-pay | Admitting: Orthopedic Surgery

## 2024-07-20 ENCOUNTER — Encounter: Payer: Self-pay | Admitting: Orthopedic Surgery

## 2024-07-20 DIAGNOSIS — M4722 Other spondylosis with radiculopathy, cervical region: Secondary | ICD-10-CM

## 2024-07-20 MED ORDER — IBUPROFEN 800 MG PO TABS: 800.0000 mg | ORAL_TABLET | Freq: Three times a day (TID) | ORAL | 1 refills | Status: AC | PRN

## 2024-07-20 NOTE — Progress Notes (Signed)
   LMP  (LMP Unknown)   There is no height or weight on file to calculate BMI.  Chief Complaint  Patient presents with   Neck Pain    Down arm     No diagnosis found.  DOI/DOS/ Date: since May 2025  Improved numbness down right arm not as severe took prednisone taper

## 2024-07-20 NOTE — Progress Notes (Signed)
   Chief Complaint  Patient presents with   Neck Pain    Down arm     Encounter Diagnosis  Name Primary?   Cervical spondylosis with radiculopathy Yes    DOI/DOS/ Date: since May 2025  58 year old female comes in for follow-up for presumed cervical radiculopathy in the right arm.  She has improved with prednisone  and gabapentin   She continues to take gabapentin  and at this point we will put her back on ibuprofen   She lost her insurance when AF she wipes closed so we can indicate through MyChart to keep her cost down  Meds ordered this encounter  Medications   ibuprofen  (ADVIL ) 800 MG tablet    Sig: Take 1 tablet (800 mg total) by mouth every 8 (eight) hours as needed.    Dispense:  90 tablet    Refill:  1

## 2024-07-20 NOTE — Patient Instructions (Signed)
 Send my chart message in 3 weeks to let us  know how you are feeling

## 2024-08-16 ENCOUNTER — Encounter: Payer: Self-pay | Admitting: Orthopedic Surgery

## 2024-08-20 ENCOUNTER — Encounter: Payer: Self-pay | Admitting: Gastroenterology

## 2024-10-20 ENCOUNTER — Ambulatory Visit: Payer: Self-pay | Admitting: Gastroenterology

## 2024-10-21 ENCOUNTER — Ambulatory Visit: Payer: Self-pay | Admitting: Gastroenterology

## 2024-10-21 ENCOUNTER — Encounter: Payer: Self-pay | Admitting: Gastroenterology

## 2024-10-21 VITALS — BP 102/68 | HR 74 | Temp 98.3°F | Ht 62.0 in | Wt 99.8 lb

## 2024-10-21 DIAGNOSIS — K2270 Barrett's esophagus without dysplasia: Secondary | ICD-10-CM

## 2024-10-21 DIAGNOSIS — Z860101 Personal history of adenomatous and serrated colon polyps: Secondary | ICD-10-CM | POA: Insufficient documentation

## 2024-10-21 DIAGNOSIS — K219 Gastro-esophageal reflux disease without esophagitis: Secondary | ICD-10-CM

## 2024-10-21 DIAGNOSIS — K2100 Gastro-esophageal reflux disease with esophagitis, without bleeding: Secondary | ICD-10-CM

## 2024-10-21 DIAGNOSIS — K5900 Constipation, unspecified: Secondary | ICD-10-CM

## 2024-10-21 MED ORDER — LINACLOTIDE 145 MCG PO CAPS: 145.0000 ug | ORAL_CAPSULE | Freq: Every day | ORAL | Status: AC

## 2024-10-21 MED ORDER — ESOMEPRAZOLE MAGNESIUM 40 MG PO CPDR: DELAYED_RELEASE_CAPSULE | ORAL | 3 refills | Status: AC

## 2024-10-21 NOTE — Progress Notes (Signed)
 GI Office Note    Referring Provider: Alphonsa Glendia LABOR, MD Primary Care Physician:  Alphonsa Glendia LABOR, MD  Primary Gastroenterologist: Carlin POUR. Cindie, DO   Chief Complaint   Chief Complaint  Patient presents with   Follow-up    Here for further refills on esomeprazole     History of Present Illness   Carla Wise is a 58 y.o. female presenting today for follow up. Last seen 09/2023. H/o Barrett's esophagus, constipation, gastric adenoma (2018), GERD.   Discussed the use of AI scribe software for clinical note transcription with the patient, who gave verbal consent to proceed.  History of Present Illness   Carla Wise is a 58 year old female with a history of esophageal issues and heartburn who presents with difficulty swallowing and heartburn.  She has been experiencing difficulty swallowing and episodes of food impaction in her throat for the past couple of months, accompanied by a choking sensation. She had one significant episode where she felt unable to swallow or regurgitate the food, requiring her husband's assistance. No nausea, vomiting, or stomach pain.  Her heartburn is generally controlled with Nexium , which she plans to refill through a cost-effective pharmacy service. She uses Linzess  twice a week to manage her bowel habits due to cost constraints, having previously received samples of the 145 mcg dose. No blood in her stool.  Socially, she has been unemployed since March when her workplace closed. 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 She does not have health insurance and has not recently applied for patient assistance programs.      Prior Data   EGD 03/2021: -Mild Schatzki ring. Dilated. Biopsied. 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 -Gastritis. Biopsied. Unremarkable. No h.pylori -Normal duodenal bulb, first portion of the duodenum and second  portion of the duodenum. -Surveillance EGD in 5 years given prior history of Barrett's   Colonoscopy 03/2021: -Non-bleeding internal hemorrhoids. -Diverticulosis in the sigmoid colon. -One 11 mm polyp at the hepatic flexure, removed with mucosal resection. Resected and retrieved. Sessile serrate polyp without cytologic dysplasia -One 4 mm polyp in the transverse colon, removed with a cold snare. Resected and retrieved. Sessile serrate polyps without cytologic dysplasia -The examination was otherwise normal. -Mucosal resection was performed. Resection and retrieval were complete. -3 year surveillance colonoscopy advised   Previous work up with Dr. 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 No H. Pylori on pathology.  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 Needs repeat EGD in 1 year.  Manometry 07/27/20: Normal.  Ph/impeedance 07/28/20: No evidence of pathologic gastroesophageal reflux.  Reflux symptoms slightly elevated, predominantly weekly.  Increase weekly acid reflux symptoms could be secondary to supragastric belching. Recommended trial of baclofen .  Follow-up telemedicine visit 08/16/20. Patient preferred medication management rather than antireflux surgery. She was started on baclofen  5 mg. Stated she can titrate up to TID as tolerated and depending on efficacy. Resume omeprazole . Follow-up prn.     Medications   Current Outpatient Medications  Medication Sig Dispense Refill   ALPRAZolam  (XANAX ) 0.5 MG tablet Take 1 tablet (0.5 mg total) by mouth at bedtime. Prn sleep 30 tablet 2   esomeprazole  (NEXIUM ) 40 MG capsule Take 1 capsule by mouth  30 minutes before a meal once to twice daily for acid reflux 180 capsule 3   ibuprofen  (ADVIL ) 800 MG tablet Take 1  tablet (800 mg total) by mouth every 8 (eight) hours as needed. 90 tablet 1   meclizine  (ANTIVERT ) 25 MG tablet Take 1 tablet (25 mg total) by mouth 2 (two) times daily as needed for dizziness or nausea. 30 tablet 1   No current facility-administered medications for this visit.    Allergies   Allergies as of 10/21/2024 - Review Complete 10/21/2024  Allergen Reaction Noted   Azithromycin   11/23/2013   Levaquin  [levofloxacin  in d5w]  02/04/2015   Lexapro  [escitalopram  oxalate] Other (See Comments) 08/14/2023     Review of Systems   General: Negative for anorexia, weight loss, fever, chills, fatigue, weakness. ENT: Negative for hoarseness,  nasal congestion. See hpi CV: Negative for chest pain, angina, palpitations, dyspnea on exertion, peripheral edema.  Respiratory: Negative for dyspnea at rest, dyspnea on exertion, cough, sputum, wheezing.  GI: See history of present illness. GU:  Negative for dysuria, hematuria, urinary incontinence, urinary frequency, nocturnal urination.  Endo: Negative for unusual weight change.     Physical Exam   BP 102/68 (BP Location: Right Arm, Patient Position: Sitting, Cuff Size: Normal)   Pulse 74   Temp 98.3 F (36.8 C) (Oral)   Ht 5' 2 (1.575 m)   Wt 99 lb 12.8 oz (45.3 kg)   LMP  (LMP Unknown)   SpO2 94%   BMI 18.25 kg/m    General: Well-nourished, well-developed in no acute distress.  Eyes: No icterus. Mouth: Oropharyngeal mucosa moist and pink   Lungs: Clear to auscultation bilaterally.  Heart: Regular rate and rhythm, no murmurs rubs or gallops.  Abdomen: Bowel sounds are normal, nontender, nondistended, no hepatosplenomegaly or masses,  no abdominal bruits or hernia , no rebound or guarding.  Rectal: not performed Extremities: No lower extremity edema. No clubbing or deformities. Neuro: Alert and oriented x 4   Skin: Warm and dry, no jaundice.   Psych: Alert and cooperative, normal mood and affect.  Labs   Lab Results   Component Value Date   NA 141 04/16/2023   CL 102 04/16/2023   K 4.2 04/16/2023   CO2 24 04/16/2023   BUN 10 04/16/2023   CREATININE 0.73 04/16/2023   EGFR 96 04/16/2023   CALCIUM  10.1 04/16/2023   ALBUMIN 4.8 04/16/2023   GLUCOSE 89 04/16/2023   Lab Results  Component Value Date   ALT 6 04/16/2023   AST 14 04/16/2023   ALKPHOS 98 04/16/2023   BILITOT <0.2 04/16/2023   Lab Results  Component Value Date   WBC 6.5 04/16/2023   HGB 14.3 04/16/2023   HCT 43.6 04/16/2023   MCV 89 04/16/2023   PLT 258 04/16/2023   Lab Results  Component Value Date   VITAMINB12 555 08/09/2023   No results found for: FOLATE Lab Results  Component Value Date   IRON 64 08/09/2023   TIBC 252 08/09/2023   FERRITIN 80 08/09/2023    Imaging Studies   No results found.  Assessment/Plan:      GERD/Dysphagia Typical GERD well controlled. Couple of months with episodes of food impaction, occurring once. Feels she need EGD/ED, lack of insurance with loss of job, therefore she wants to hold off. - Provided patient assistance forms for potential [facility] financial aid. - Will schedule endoscopy if patient assistance is approved or patient desires to scheduled. -continue Nexium  40mg  once to twice daily  before meals, lowest effective dose -return ov in one year.  Chronic constipation Managed with Linzess , used twice weekly due to cost. Over-the-counter options like Miralax have been ineffective. No hematochezia. - Provided samples of Linzess  145mcg daily as needed to manage constipation.   History of adenomatous colon polyps: -due surveillance colonoscopy 03/2024, patient wants to hold off due to lack of insurance. Will apply for [facility] assistance -call when ready to schedule    Sonny RAMAN. Ezzard, MHS, PA-C [facility]

## 2024-10-21 NOTE — Patient Instructions (Signed)
 Continue esomeprazole  40mg , take once to twice daily before a meal to control reflux symptoms. If you can get by with taking once a day and have good control of symptoms, we would advise taking once a day. Samples of Linzess  145mcg provided to take once daily as needed for constipation. Consider completing [facility] Assistance forms as you are overdue for colonoscopy and you are having difficulty with swallowing and should have an upper endoscopy. Let me know if you qualify and/or you want to move forward with these procedures.  Return office visit in one year or sooner if needed.

## 2024-10-21 NOTE — Progress Notes (Signed)
 Medication Samples have been provided to the patient.  Drug name: Linzess        Strength: 145 mcg        Qty: 6  LOT: [id number]  Exp.Date: 02/2025  Dosing instructions: take one capsule daily 30 mins before breakfast  The patient has been instructed regarding the correct time, dose, and frequency of taking this medication, including desired effects and most common side effects.   Carla Wise Boom 8:42 AM 10/21/2024

## 2024-11-25 ENCOUNTER — Ambulatory Visit: Payer: Self-pay | Admitting: Gastroenterology

## 2024-12-07 ENCOUNTER — Ambulatory Visit: Payer: Self-pay

## 2024-12-07 NOTE — Telephone Encounter (Signed)
" °  FYI Only or Action Required?: FYI only for provider: appointment scheduled on 12.30.25.  Patient was last seen in primary care on 06/29/2024 by Mauro Elveria BROCKS, NP.  Called Nurse Triage reporting Shortness of Breath.  Symptoms began several weeks ago.  Interventions attempted: OTC medications: Nyquil.  Symptoms are: stable.  Triage Disposition: See PCP When Office is Open (Within 3 Days)  Patient/caregiver understands and will follow disposition?: Yes  Copied from CRM #[id number]. Topic: Clinical - Red Word Triage >> Dec 07, 2024  8:52 AM Kevelyn M wrote: Red Word that prompted transfer to Nurse Triage: Hacking cough for 3 weeks/shortess of breath off and on. No fever. Reason for Disposition  [1] MODERATE longstanding difficulty breathing (e.g., speaks in phrases, SOB even at rest, pulse 100-120) AND [2] SAME as normal  Answer Assessment - Initial Assessment Questions 1. RESPIRATORY STATUS: Describe your breathing? (e.g., wheezing, shortness of breath, unable to speak, severe coughing)       Out of breath sometimes  2. ONSET: When did this breathing problem begin?       A few weeks  3. PATTERN Does the difficult breathing come and go, or has it been constant since it started?       Comes and goes  4. SEVERITY: How bad is your breathing? (e.g., mild, moderate, severe)       mild  5. RECURRENT SYMPTOM: Have you had difficulty breathing before? If Yes, ask: When was the last time? and What happened that time?       Symptoms are recurrent  6. CARDIAC HISTORY: Do you have any history of heart disease? (e.g., heart attack, angina, bypass surgery, angioplasty)       Per pt's chart, pt does not have cardiac hx  7. LUNG HISTORY: Do you have any history of lung disease?  (e.g., pulmonary embolus, asthma, emphysema)      Per pt's chart, pt has hx of COPD        8. OTHER SYMPTOMS: Pt denies chest pain, dizziness, fever, runny nose        Yes, cough  Pt reports  cough and SOB Pt is taking OTC Nyquil Pt scheduled for a visit on 12.30.25  for further evaluation. Pt agrees with plan of care, will call back for any worsening symptoms  Protocols used: Breathing Difficulty-A-AH  "

## 2024-12-08 ENCOUNTER — Ambulatory Visit: Payer: Self-pay | Admitting: Physician Assistant

## 2024-12-08 ENCOUNTER — Ambulatory Visit (HOSPITAL_COMMUNITY)
Admission: RE | Admit: 2024-12-08 | Discharge: 2024-12-08 | Disposition: A | Discharge status: Home / Self Care | Payer: Self-pay | Source: Ambulatory Visit | Attending: Physician Assistant | Admitting: Physician Assistant

## 2024-12-08 ENCOUNTER — Encounter: Payer: Self-pay | Admitting: Physician Assistant

## 2024-12-08 VITALS — BP 126/83 | Ht 62.0 in | Wt 101.2 lb

## 2024-12-08 DIAGNOSIS — R0602 Shortness of breath: Secondary | ICD-10-CM | POA: Insufficient documentation

## 2024-12-08 MED ORDER — PREDNISONE 20 MG PO TABS: 40.0000 mg | ORAL_TABLET | Freq: Every day | ORAL | 0 refills | Status: AC

## 2024-12-08 MED ORDER — ALPRAZOLAM 0.5 MG PO TABS: 0.5000 mg | ORAL_TABLET | Freq: Every day | ORAL | 0 refills | Status: AC

## 2024-12-08 NOTE — Progress Notes (Signed)
 "  Acute Office Visit  Subjective:     Patient ID: Carla Wise, female    DOB: 12-13-1965, 58 y.o.   MRN: [id number]   Discussed the use of AI scribe software for clinical note transcription with the patient, who gave verbal consent to proceed.  History of Present Illness Carla Wise is a 58 year old female who presents with a persistent cough and intermittent shortness of breath for three weeks. She reports a 3-week persistent cough, initially thought to be a cold, now with intermittent but mild shortness of breath. She denies chest pain, nasal congestion, headache, or fever. She has had walking pneumonia in the past. She is using NyQuil at night for symptom relief. She smokes 4-5 cigarettes on most days and is trying to cut down. She lacks health insurance and is worried about the cost of care.   Review of Systems  Constitutional:  Negative for activity change, appetite change, fatigue and fever.  HENT:  Positive for postnasal drip. Negative for rhinorrhea, sinus pressure, sinus pain and sore throat.   Respiratory:  Positive for cough and shortness of breath. Negative for chest tightness and wheezing.   Cardiovascular:  Negative for chest pain.  Neurological:  Negative for light-headedness and headaches.       Objective:     BP 126/83   Ht 5' 2 (1.575 m)   Wt 101 lb 3.2 oz (45.9 kg)   LMP  (LMP Unknown)   BMI 18.51 kg/m   Physical Exam Vitals reviewed.  Constitutional:      General: She is not in acute distress.    Appearance: Normal appearance. She is normal weight. She is not ill-appearing.  HENT:     Nose: Nose normal.     Mouth/Throat:     Mouth: Mucous membranes are moist.     Pharynx: Oropharynx is clear. Posterior oropharyngeal erythema present.  Eyes:     Extraocular Movements: Extraocular movements intact.     Conjunctiva/sclera: Conjunctivae normal.  Cardiovascular:     Rate and Rhythm: Normal rate and regular rhythm.     Heart sounds: No murmur  heard. Pulmonary:     Effort: Pulmonary effort is normal.     Breath sounds: Wheezing present. No rhonchi or rales.  Skin:    General: Skin is warm and dry.  Neurological:     General: No focal deficit present.     Mental Status: She is alert and oriented to person, place, and time.  Psychiatric:        Mood and Affect: Mood normal.        Behavior: Behavior normal.     No results found for any visits on 12/08/24.      Assessment & Plan:  SOB (shortness of breath) Assessment & Plan: Patient presents with history of COPD and active cigarette smoking with 3 weeks of cough and intermittent shortness of breath. Denies chest pain or fever. Symptoms are most consistent with a COPD exacerbation, likely subacute. Lack of fever and chest pain makes pneumonia and acute coronary syndrome less likely, but given symptom duration and smoking history, further evaluation is warranted. Mild wheezing noted on lung exam. - Ordered chest x-ray to evaluate for pneumonia or other lung pathology. Will consider antibiotic therapy pending x-ray results.  - She does not currently use daily or as needed inhalers. Discussed albuterol  inhaler, however patient deferred at this time.  - Prescribed prednisone  40 mg daily for five days. - Advised use of OTC  cough syrup and NyQuil as needed. - Advised continued smoking cessation.  - Instructed to report if symptoms do not improve in one week. Warning signs and ER precautions discussed.   Orders: -     predniSONE ; Take 2 tablets (40 mg total) by mouth daily for 5 days.  Dispense: 10 tablet; Refill: 0 -     DG Chest 2 View  Other orders -     ALPRAZolam ; Take 1 tablet (0.5 mg total) by mouth at bedtime. Prn sleep  Dispense: 30 tablet; Refill: 0    Return if symptoms worsen or fail to improve.  Cecilio Ohlrich, PA-C  "

## 2024-12-08 NOTE — Assessment & Plan Note (Addendum)
 Patient presents with history of COPD and active cigarette smoking with 3 weeks of cough and intermittent shortness of breath. Denies chest pain or fever. Symptoms are most consistent with a COPD exacerbation, likely subacute. Lack of fever and chest pain makes pneumonia and acute coronary syndrome less likely, but given symptom duration and smoking history, further evaluation is warranted. Mild wheezing noted on lung exam. - Ordered chest x-ray to evaluate for pneumonia or other lung pathology. Will consider antibiotic therapy pending x-ray results.  - She does not currently use daily or as needed inhalers. Discussed albuterol  inhaler, however patient deferred at this time.  - Prescribed prednisone  40 mg daily for five days. - Advised use of OTC cough syrup and NyQuil as needed. - Advised continued smoking cessation.  - Instructed to report if symptoms do not improve in one week. Warning signs and ER precautions discussed.

## 2024-12-09 ENCOUNTER — Ambulatory Visit: Payer: Self-pay | Admitting: Physician Assistant

## 2024-12-09 ENCOUNTER — Encounter: Payer: Self-pay | Admitting: Physician Assistant

## 2025-10-25 ENCOUNTER — Ambulatory Visit: Payer: Self-pay | Admitting: Gastroenterology
